# Patient Record
Sex: Male | Born: 1935
Health system: Southern US, Community
[De-identification: ages and names within clinical notes are randomized; demographics above are authoritative.]

## PROBLEM LIST (undated history)

## (undated) DIAGNOSIS — J449 Chronic obstructive pulmonary disease, unspecified: Secondary | ICD-10-CM

## (undated) DIAGNOSIS — H409 Unspecified glaucoma: Secondary | ICD-10-CM

## (undated) DIAGNOSIS — J42 Unspecified chronic bronchitis: Secondary | ICD-10-CM

## (undated) DIAGNOSIS — R1310 Dysphagia, unspecified: Secondary | ICD-10-CM

## (undated) DIAGNOSIS — I639 Cerebral infarction, unspecified: Secondary | ICD-10-CM

## (undated) DIAGNOSIS — R0989 Other specified symptoms and signs involving the circulatory and respiratory systems: Secondary | ICD-10-CM

## (undated) DIAGNOSIS — I2510 Atherosclerotic heart disease of native coronary artery without angina pectoris: Secondary | ICD-10-CM

## (undated) DIAGNOSIS — I10 Essential (primary) hypertension: Secondary | ICD-10-CM

## (undated) DIAGNOSIS — E785 Hyperlipidemia, unspecified: Secondary | ICD-10-CM

## (undated) DIAGNOSIS — E119 Type 2 diabetes mellitus without complications: Secondary | ICD-10-CM

## (undated) DIAGNOSIS — K5900 Constipation, unspecified: Secondary | ICD-10-CM

## (undated) DIAGNOSIS — D500 Iron deficiency anemia secondary to blood loss (chronic): Secondary | ICD-10-CM

## (undated) HISTORY — DX: Other specified symptoms and signs involving the circulatory and respiratory systems: R09.89

## (undated) HISTORY — DX: Essential (primary) hypertension: I10

## (undated) HISTORY — DX: Atherosclerotic heart disease of native coronary artery without angina pectoris: I25.10

## (undated) HISTORY — DX: Unspecified chronic bronchitis: J42

## (undated) HISTORY — DX: Chronic obstructive pulmonary disease, unspecified: J44.9

## (undated) HISTORY — PX: CARDIAC CATHETERIZATION: SHX172

## (undated) HISTORY — DX: Hyperlipidemia, unspecified: E78.5

## (undated) HISTORY — DX: Iron deficiency anemia secondary to blood loss (chronic): D50.0

## (undated) HISTORY — DX: Type 2 diabetes mellitus without complications: E11.9

---

## 1999-02-17 ENCOUNTER — Inpatient Hospital Stay (HOSPITAL_COMMUNITY): Admission: EM | Admit: 1999-02-17 | Discharge: 1999-02-24 | Payer: Self-pay | Admitting: Cardiology

## 1999-02-21 ENCOUNTER — Encounter: Payer: Self-pay | Admitting: Cardiothoracic Surgery

## 1999-02-22 ENCOUNTER — Encounter: Payer: Self-pay | Admitting: Cardiothoracic Surgery

## 1999-02-23 ENCOUNTER — Encounter: Payer: Self-pay | Admitting: Cardiothoracic Surgery

## 2001-08-01 ENCOUNTER — Inpatient Hospital Stay (HOSPITAL_COMMUNITY): Admission: EM | Admit: 2001-08-01 | Discharge: 2001-08-05 | Payer: Self-pay | Admitting: Cardiology

## 2002-10-24 ENCOUNTER — Ambulatory Visit (HOSPITAL_COMMUNITY): Admission: RE | Admit: 2002-10-24 | Discharge: 2002-10-25 | Payer: Self-pay | Admitting: Internal Medicine

## 2002-11-09 ENCOUNTER — Ambulatory Visit (HOSPITAL_COMMUNITY): Admission: RE | Admit: 2002-11-09 | Discharge: 2002-11-10 | Payer: Self-pay | Admitting: Cardiology

## 2007-07-05 ENCOUNTER — Ambulatory Visit: Payer: Self-pay | Admitting: Cardiology

## 2007-07-06 ENCOUNTER — Encounter: Payer: Self-pay | Admitting: Cardiology

## 2007-07-07 ENCOUNTER — Inpatient Hospital Stay (HOSPITAL_COMMUNITY): Admission: AD | Admit: 2007-07-07 | Discharge: 2007-07-08 | Payer: Self-pay | Admitting: Cardiology

## 2007-07-07 ENCOUNTER — Ambulatory Visit: Payer: Self-pay | Admitting: Cardiovascular Disease

## 2007-07-16 ENCOUNTER — Encounter: Payer: Self-pay | Admitting: Cardiology

## 2007-07-16 ENCOUNTER — Ambulatory Visit: Payer: Self-pay | Admitting: Cardiology

## 2007-07-17 ENCOUNTER — Ambulatory Visit: Payer: Self-pay | Admitting: Vascular Surgery

## 2007-07-17 ENCOUNTER — Inpatient Hospital Stay (HOSPITAL_COMMUNITY): Admission: EM | Admit: 2007-07-17 | Discharge: 2007-07-18 | Payer: Self-pay | Admitting: Emergency Medicine

## 2007-07-17 ENCOUNTER — Encounter: Payer: Self-pay | Admitting: Cardiology

## 2007-08-03 ENCOUNTER — Ambulatory Visit: Payer: Self-pay | Admitting: Cardiology

## 2007-11-22 ENCOUNTER — Inpatient Hospital Stay (HOSPITAL_COMMUNITY): Admission: AD | Admit: 2007-11-22 | Discharge: 2007-11-25 | Payer: Self-pay | Admitting: Cardiovascular Disease

## 2007-11-22 ENCOUNTER — Encounter: Payer: Self-pay | Admitting: Cardiology

## 2007-11-22 ENCOUNTER — Ambulatory Visit: Payer: Self-pay | Admitting: Cardiology

## 2007-11-22 ENCOUNTER — Encounter: Payer: Self-pay | Admitting: Physician Assistant

## 2007-11-25 ENCOUNTER — Encounter: Payer: Self-pay | Admitting: Cardiology

## 2007-12-18 ENCOUNTER — Encounter: Payer: Self-pay | Admitting: Physician Assistant

## 2007-12-18 ENCOUNTER — Ambulatory Visit: Payer: Self-pay | Admitting: Cardiology

## 2007-12-22 ENCOUNTER — Ambulatory Visit: Payer: Self-pay | Admitting: Cardiology

## 2008-03-29 ENCOUNTER — Ambulatory Visit: Payer: Self-pay | Admitting: Cardiology

## 2009-04-12 ENCOUNTER — Ambulatory Visit: Payer: Self-pay | Admitting: Cardiology

## 2009-04-16 ENCOUNTER — Encounter: Payer: Self-pay | Admitting: Cardiology

## 2009-08-14 ENCOUNTER — Encounter: Payer: Self-pay | Admitting: Cardiology

## 2009-08-26 ENCOUNTER — Encounter: Payer: Self-pay | Admitting: Cardiology

## 2009-09-08 ENCOUNTER — Encounter: Payer: Self-pay | Admitting: Cardiology

## 2009-11-18 ENCOUNTER — Encounter: Payer: Self-pay | Admitting: Cardiology

## 2009-11-18 DIAGNOSIS — I10 Essential (primary) hypertension: Secondary | ICD-10-CM

## 2009-11-18 DIAGNOSIS — Z951 Presence of aortocoronary bypass graft: Secondary | ICD-10-CM

## 2009-11-18 DIAGNOSIS — I2510 Atherosclerotic heart disease of native coronary artery without angina pectoris: Secondary | ICD-10-CM

## 2009-11-18 DIAGNOSIS — J449 Chronic obstructive pulmonary disease, unspecified: Secondary | ICD-10-CM

## 2009-11-18 DIAGNOSIS — E785 Hyperlipidemia, unspecified: Secondary | ICD-10-CM

## 2009-11-18 DIAGNOSIS — F17200 Nicotine dependence, unspecified, uncomplicated: Secondary | ICD-10-CM

## 2009-11-18 DIAGNOSIS — Z8673 Personal history of transient ischemic attack (TIA), and cerebral infarction without residual deficits: Secondary | ICD-10-CM

## 2009-11-18 DIAGNOSIS — Z9861 Coronary angioplasty status: Secondary | ICD-10-CM | POA: Insufficient documentation

## 2009-11-18 DIAGNOSIS — I200 Unstable angina: Secondary | ICD-10-CM

## 2009-11-18 DIAGNOSIS — I739 Peripheral vascular disease, unspecified: Secondary | ICD-10-CM | POA: Insufficient documentation

## 2009-11-18 DIAGNOSIS — J4489 Other specified chronic obstructive pulmonary disease: Secondary | ICD-10-CM | POA: Insufficient documentation

## 2010-06-02 NOTE — Letter (Signed)
Summary: Appointment -missed  [facility] [address]   [place], [place] [address]   Phone: [phone]  Fax: [phone]     November 18, 2009 MRN: [id number]     [facility] [address] [place], [place]  [address]     Dear Mr. [facility],  Our records indicate you missed your appointment on November 18, 2009                        with Dr.  Andee Lineman.   It is very important that we reach you to reschedule this appointment. We look forward to participating in your health care needs.   Please contact us at the number listed above at your earliest convenience to reschedule this appointment.   Sincerely,    Glass blower/designer

## 2010-06-02 NOTE — Consult Note (Signed)
Summary: CARDIOLOGY CONSULT/ [facility]  CARDIOLOGY CONSULT/ [facility]   Imported By: Zachary George 11/18/2009 11:03:55  _____________________________________________________________________  External Attachment:    Type:   Image     Comment:   External Document

## 2010-06-02 NOTE — Letter (Signed)
Summary: [facility] D/C DR. MOHAMMAD ANWAR  [facility] D/C DR. MOHAMMAD ANWAR   Imported By: Zachary George 11/18/2009 11:03:10  _____________________________________________________________________  External Attachment:    Type:   Image     Comment:   External Document

## 2010-06-02 NOTE — Letter (Signed)
Summary: Appointment -missed  [facility]  [address]   [place], [place] [address]   Phone: [phone]  Fax: [phone]     November 18, 2009 MRN: [id number]     [facility] Devine[facility] [address] [place], [place]  [address]     Dear Mr. Mizuno,  Our records indicate you missed your appointment on November 18, 2009                        with Dr.  Haelie Clapp.   It is very important that we reach you to reschedule this appointment. We look forward to participating in your health care needs.   Please contact us at the number listed above at your earliest convenience to reschedule this appointment.   Sincerely,     HeartCare Scheduling Team 

## 2010-06-02 NOTE — Consult Note (Signed)
Summary: CARDIOLOGY CONSULT/ [facility]  CARDIOLOGY CONSULT/ [facility]   Imported By: Zachary George 11/18/2009 11:01:25  _____________________________________________________________________  External Attachment:    Type:   Image     Comment:   External Document

## 2010-09-15 NOTE — Assessment & Plan Note (Signed)
[facility]                          [place] [facility] OFFICE NOTE   [name]                       MRN:          [id number]  DATE:12/18/2007                            DOB:          07-26-1934    CARDIOLOGIST:  Learta Codding, MD,FACC   PRIMARY CARE PHYSICIAN:  Erasmo Downer, MD   REASON FOR VISIT:  Posthospitalization followup.   HISTORY OF PRESENT ILLNESS:  [name] is a 76 year old male patient  with a history of CAD status post CABG in 2000, who has had multiple  percutaneous coronary interventions in the past.  Most notably, he had  stenting of the left main in 2003 and then cutting balloon angioplasty  secondary to in-stent restenosis in July 2004.  His most recent  intervention was done in March 2009, where he had bare-metal stent  placement to the proximal, native RCA.  The patient was recently seen at  [facility] with complaints of chest pain that were concerning for  unstable angina.  Of note, he did have elevated troponins ruling him in  for a non-ST-elevation myocardial infarction.  He was transferred to  [facility] where he underwent cardiac catheterization and  subsequent percutaneous coronary intervention of the native RCA  secondary to in-stent restenosis.  Specifically, his internal mammary to  the second diagonal, saphenous vein graft to the distal LAD, saphenous  vein graft to obtuse marginal were all patent.  His vein graft to the  first diagonal has been known to be occluded in the past.  His left main  stent continued to be patent with 70% narrowing.  His EF was noted to be  55%.  The patient was not felt to be a candidate for drug-eluting stent  therapy secondary to a history of microcytic anemia and probable history  of GI bleeding in the past.  The patient was maintained on Plavix for 5  days post intervention.  He continues on aspirin therapy.  During his  hospitalization, he was noted to  have microcytic anemia and lab values  returned revealing an iron level of 27 and a ferritin level of 13.  He  was placed on iron replacement therapy.   In the office today, the patient notes he is overall doing well.  He  denies any chest pain similar to what he came into the hospital with  several weeks ago.  He does have some chest soreness.  This is mainly in  the afternoons.  He denies any significant shortness of breath.  He  denies orthopnea or PND.  He does have occasional pedal edema without  significant change.  He denies syncope, but has had some near-syncopal  episodes per his report.  This usually occurs from sitting to standing.  He has not fallen.  He denies any history of balance problems in the  past.  He does note dark stools.  He denies any tarry stools.  This has  been present since he was started on iron therapy.   CURRENT MEDICATIONS:  Metoprolol 25 mg b.i.d. (we are not sure if he has  taken this medication or not), Lisinopril 20 mg daily,  Metformin 500 mg b.i.d., Glipizide ER 5 mg daily, Aspirin 325 mg daily,  Ferrex 150 b.i.d., Meclizine 25 mg q.i.d. p.r.n., Isosorbide 20 mg  b.i.d., Simvastatin 20 mg nightly,Nitroglycerin p.r.n. chest pain.   PHYSICAL EXAMINATION:  GENERAL:  He is a well-nourished, well-developed  male, in no acute distress.  VITAL SIGNS:  Blood pressure 108/58, pulse 86, and weight 188.8 pounds.  HEENT:  Normal.  NECK:  Without JVD or lymphadenopathy.  CARDIAC:  Normal S1 and S2, regular rate and rhythm with a 1-2/6  systolic ejection murmur best heard along the left sternal border.  LUNGS:  Clear to auscultation bilaterally.  ABDOMEN:  Soft and nontender.  EXTREMITIES:  Trace edema.  NEUROLOGIC:  He is alert and oriented x3.  Cranial nerves II-XII grossly  intact.  VASCULAR:  Bilateral femoral arteriotomy site without hematoma.  He does  have positive bilateral femoral artery bruits.  These have been present  in the past.  ORTHOSTATIC  VITAL SIGNS:  Blood pressure 115/62 with pulse of 72 lying,  sitting 124/65 with pulse of 73, standing 114/60 with a pulse of 83,  after 2 minutes 111/65 with a pulse of 73.  The patient was considerably  dizzy throughout his orthostatic vital signs.   ASSESSMENT/PLAN:  1. Coronary artery disease status post coronary artery bypass graft in      2000 with subsequent percutaneous coronary intervention of the left      main as well as the native right coronary artery.  He was recently      admitted with evidence of non-ST-elevation myocardial infarction      and significant in-stent restenosis in the RCA treated with PTCA.      He is overall stable.  He has had some chest soreness, but this is      atypical for his typical angina.  Electrocardiogram in the office      today revealed normal sinus rhythm, heart rate of 75, normal axis,      LVH with repolarization abnormality, and no significant change from      the previous tracing.  The patient will review his medicines when      he gets home and let us know if he is taking metoprolol.  If not,      he should start on 25 mg half tablet b.i.d.  He will continue on      aspirin for now as well as isosorbide.  2. Iron deficiency anemia with a history of possible gastrointestinal      bleeding.  The patient tells me he apparently had significant      workup with Dr. Linna Darner in the past.  He is due to see Dr. Linna Darner next      week.  We will check CBC and stool cards to follow up.  These      results will be forwarded on to Dr. Linna Darner.  If his hemoglobin has      significantly dropped since he was in the hospital, he will need      earlier followup with Dr. Linna Darner.  He will continue on iron for now.      He does complain of some soreness from time to time.  Question      whether or not he may have some problems with acid reflux.  At this      point in time, I have recommended ranitidine 150 mg b.i.d.  3. Dyslipidemia.  The patient's recent lipid panel  returned with a      total cholesterol of 113, HDL 17, and LDL 46.  He continues on      simvastatin.  It has been quite some time since his LFTs were      checked.  We will check a CMET with his blood work today to follow      up on that.  4. Cerebrovascular disease.  His last carotid Dopplers were done in      April 2009.  At that time, he had 70-79% right ICA stenosis and 50-      69% left ICA stenosis.  We will make sure he has followup carotid      Dopplers done in October of this year.  5. Chronic obstructive pulmonary disease with ongoing tobacco abuse.      The patient knows he needs to discontinue his cigarettes.  6. Diabetes mellitus.  This is followed by his primary care physician.      He should continue followup for adequate control.  7. Hypertension.  This is adequately controlled.  He will continue on      his current medications.  As noted, he will contact us about his      metoprolol.  8. Systolic murmur.  The patient has had preserved LV function      documented in the past by cardiac catheterization.  I cannot find      an echocardiogram documented in the past and he does have systolic      murmur on exam.  An echocardiogram will be obtained.  9. Dizziness.  The patient has meclizine listed on his medications.      He may have problems with vertigo.  He also may have other balance      issues.  He is not orthostatic on exam today.  He should follow up      with his primary care physician.   DISPOSITION:  The patient will be brought back in followup early in the  next 2-3 weeks with Dr. Andee Lineman.  We will review the above testing and  make further recommendations if needed.  The patient is to return sooner  as necessary.  As noted above, he will be set up with his primary care  physician in the next 1-2 weeks as well.      Tereso Newcomer, PA-C  Electronically Signed      Madolyn Frieze. Jens Som, MD, [facility]  Electronically Signed   SW/MedQ  DD: 12/18/2007  DT:  12/19/2007  Job #: [id number]   cc:   Erasmo Downer, MD

## 2010-09-15 NOTE — Cardiovascular Report (Signed)
Miller, [name]                ACCOUNT NO.:  [id number]   MEDICAL RECORD NO.:  [id number]          PATIENT TYPE:  INP   LOCATION:  6529                         FACILITY:  [facility]   PHYSICIAN:  Veverly Fells. Excell Seltzer, MD  DATE OF BIRTH:  [birth year]   DATE OF PROCEDURE:  07/07/2007  DATE OF DISCHARGE:  07/08/2007                            CARDIAC CATHETERIZATION   PROCEDURE:  1. Left heart catheterization.  2. Selective coronary angiography.  3. Left ventricular angiography.  4. Saphenous vein graft angiography.  5. Left internal mammary artery angiography.  6. Percutaneous transluminal cardiac angioplasty and stenting of the      right coronary artery.  7. Abdominal aortogram.   INDICATIONS:  Mr. Schnitker is a 76 year old gentleman who is post CABG.  He has undergone PCI procedures as well.  He underwent stenting of a  protected left main several years back as well as treatment of in-stent  restenosis with cutting balloon angioplasty.  He is not followed  regularly with any physicians, and he presented after several years with  symptoms consistent with unstable angina.  He was referred for cardiac  catheterization.  He initially has ruled out for myocardial infarction.   Risks and indications of the procedure were reviewed with the patient.  Informed consent was obtained.  The right groin was prepped, draped,  anesthetized with 1% lidocaine.  Using modified Seldinger technique, a 6-  French sheath was placed in the right femoral artery.  Standard 6-French  Judkins catheters were used for native coronary angiography. A JR-4  catheter was then used for the saphenous vein graft angiography. The  same JR-4 catheter was used for LIMA angiography, and I was able to  enter the subclavian artery with that catheter.  However, I was unable  to selectively engage the LIMA.  Using an exchange length guidewire, I  changed out for a LIMA catheter, but after multiple attempts was still  unable to  selectively engage the vessel.  Therefore, a subclavian  angiogram was performed, and the LIMA was well seen.   Following the diagnostic procedure, I elected to intervene on the right  coronary artery.  There was a moderately severe stenosis in the proximal  vessel of 80%.  Angiomax was used for anticoagulation.  The patient was  loaded with Plavix.  The lesion was predilated with a 2.5 of 15 mm Fire  Star balloon.  Following predilatation, I elected to stent the vessel  with 3.0 x 18 mm Vision stent which was deployed at 14 atmospheres.  Following stenting, there was marked improvement in the appearance of  the proximal vessel.  The stent appeared widely expanded, and there was  TIMI III flow throughout.  I elected to post dilate the stent with a  3.25 x 10 mm DuraStar balloon on two separate inflations up to 16  atmospheres.  Following post dilatation, there was an excellent  angiographic result.  The stent appeared widely expanded.  There was  TIMI III flow throughout.  The patient tolerated the procedure well and  had no immediate complications.  The guide catheter and wire  were  removed.  An angled pigtail catheter was passed into the left ventricle  where ventriculography was performed, and pullback across the aortic  valve was done.  The catheter was then brought back to the suprarenal  abdominal aorta where abdominal aortography was performed to visualize  the aorta and proximal iliac arteries.   FINDINGS:  Aortic pressure 168/74 with a mean of 107, left ventricular  pressure 179/19.   CORONARY ANGIOGRAPHY:  Left mainstem:  The left mainstem is stented.  There is diffuse in-stent restenosis of 70% throughout the stent.  The  left mainstem bifurcates into the LAD and left circumflex. The LAD is  patent in its proximal portion.  It supplies a diagonal branch.  The  proximal LAD appears to have 50% stenosis.  The first diagonal branches  of moderate size and has no significant  stenosis.  There is a large  first septal perforator. The mid and distal LAD fill predominantly from  graft flow.   The left circumflex system is very small.  The AV groove circumflex  supplies a distal OM branch.  The proximal OM branches also appear very  small   The right coronary artery has severe stenosis in the proximal vessel of  75-80%.  The vessel was diffusely diseased, but there are no other high-  grade stenoses present.  The distal vessel has diffuse plaque with no  significant stenosis.  It supplies a PDA branch as well as  posterolateral branch.  The PDA is relatively large. There is moderate  calcification throughout the proximal vessel.   SAPHENOUS VEIN GRAFT ANGIOGRAPHY:  Saphenous vein graft to the second OM  branch is widely patent.  The circumflex system fills predominantly via  graft flow.  There is no significant stenosis in the bypass graft.  The  first OM is very small.  The AV groove circumflex is small and diffusely  diseased   Saphenous vein graft to the first diagonal branch is occluded.   Saphenous vein graft to the LAD is anastomosed to the distal vessel.  It  fills the midportion of the LAD via retrograde flow, supplying multiple  septal perforators as well as a second diagonal branch.  The proximal  LAD is occluded   LIMA to second diagonal branch is patent.  There are no significant  stenoses.   Left ventriculography demonstrates normal LV function with an LVEF  estimated at 65%.   ABDOMINAL AORTA:  The renal arteries are widely patent.  The aorta has  no focal aneurysm.  The iliac vessels are patent.  Bilaterally, this  includes the common internal and external iliacs, all of which are  widely patent.   ASSESSMENT:  1. Three-vessel coronary artery disease.  2. Status post coronary artery bypass grafting with three of four      patent grafts.  3. Successful percutaneous intervention of the right coronary artery      with a Vision bare  metal stent.  4. Normal left ventricular function.  5. Patent abdominal aorta, renal arteries, and bilateral iliac      arteries.   PLAN:  Mr. Mandley should continue on indefinite aspirin.  He should  remain on Plavix for 30 days.  With patent grafts to the LAD and left  circumflex, I did not see a reason to intervene on his left main stent  at this point.  His right coronary artery was treated as detailed above,  and hopefully this will provide symptomatic improvement.  Veverly Fells. Excell Seltzer, MD  Electronically Signed     MDC/MEDQ  D:  07/11/2007  T:  07/11/2007  Job:  [id number]

## 2010-09-15 NOTE — Discharge Summary (Signed)
[name], [name]                ACCOUNT NO.:  [id number]   MEDICAL RECORD NO.:  [id number]          PATIENT TYPE:  INP   LOCATION:  2035                         FACILITY:  [facility]   PHYSICIAN:  Darin Morton. Riley Kill, MD, FACCDATE OF BIRTH:  1935-02-13   DATE OF ADMISSION:  07/16/2007  DATE OF DISCHARGE:                               DISCHARGE SUMMARY   DATE OF ADMISSION:  July 17, 2007.   DATE OF DISCHARGE:  July 18, 2007.   PRIMARY CARDIOLOGIST:  Dr. Lewayne Miller.   PRIMARY CARE PHYSICIAN:  Dr. Erasmo Miller.   PROCEDURES PERFORMED DURING HOSPITALIZATION:  None.   FINAL DISCHARGE DIAGNOSES:  1. Non-cardiac chest pain.  2. Coronary artery disease.      a.     Status post stenting of an 80% proximal right coronary       artery using a vision 3x18 mm stent per Dr. Excell Seltzer on July 07, 2007.      b.     Status post cutting balloon percutaneous transluminal       coronary angioplasty of an 80% end-stent left main restenosis in       July 2004.      c.     Status post stenting of a 95% left main coronary artery       stenosis by Dr. Riley Miller in April 2003.      d.     Residual 50% stenosis of the saphenous vein graft to left       anterior descending artery graft at the insertion site.  Atretic       saphenous vein graft to diagonal graft.  Otherwise, patent grafts.      e.     Preserved left ventricular ejection fraction.  3. Long-standing tobacco abuse.  4. Peripheral vascular disease.      a.     Ankle brachial index 0.77 on the right and 1.0 on the left       in 2004.  5. Type 2 diabetes.  6. Dyslipidemia.  7. Hypertension.  8. Noncompliance with medical regimen.   HOSPITAL COURSE:  This is a 76 year old African-American male well-known  to our practice, who was admitted secondary to recurrent chest  discomfort.  He was also found to be hypertensive at home and  hyperglycemic on arrival.  The patient states that the pain was  different from the pain that he experienced  prior to his stent  placement, describing it as throbbing pain on the right side of the  chest radiating to the left chest with prior admission, prior to stent  placement, described as something pressing on my chest.  The patient  was found to be hypertensive at home per EMS with blood pressure greater  than 200 systolic.  The patient was brought to the [facility] emergency  room.  He had been given nitroglycerin en route.  The patient's blood  pressure on arrival was 154/67.  The patient was brought to ICU stepdown  for further evaluation and management with a history of known coronary  artery  disease.   Cardiac enzymes were cycled and found to be negative x3.  His hemoglobin  A1c was found to be elevated at 8.6.  The patient was subsequently moved  to telemetry floor after cardiac enzymes were found to be negative.  The  patient had no further complaints of chest discomfort.  He was examined  by Dr. Willa Miller and found that it was not necessary to proceed with  cardiac catheterization, as the pain was not similar to the pain of  angina that brought him in originally less than 2 weeks prior.  The  patient did have some mild bruit on the right but no pseudo-aneurysm was  found. The patient was up and walking around the day of discharge  without any recurrence of symptoms.  The patient was also seen by  nutritional services secondary to diabetic diet noncompliance. He was  provided with more information, but they doubted that he would continue  with compliance at home.   On the day of discharge, the patient was seen by Dr. Bonnee Miller, and  found to be stable.  Blood pressure 154/72, pulse 61, respirations 20,  with a temperature of 97.3.  His blood glucose was 167, BUN 6,  creatinine 0.34, hemoglobin 11.4, and platelets 344,000.  The patient  was found to be stable for discharge.   DISCHARGE MEDICINES:  1. Aspirin 325 mg once a day.  2. Metoprolol 25 mg twice a day.  3.  Hydrochlorothiazide 12.5 mg once a day.  4. Lipitor 80 mg daily.  5. Plavix 75 mg daily.  6. Glucophage 500 mg twice a day.  7. Lisinopril 20 mg once a day (or 4 Zestril).  8. Nitroglycerin 0.4 mg as needed under tongue for chest discomfort.   ALLERGIES:  No known drug allergies.   FOLLOWUP PLAN AND APPOINTMENT:  1. The patient is to follow up with Dr. Lewayne Miller on April 2 at 1:30      p.m.  2. The patient is to follow up with his primary care physician, Dr.      Linna Miller for continued medical management of diabetes.  3. The patient has been encouraged to be medically compliant to avoid      recurrent hospitalization.  4. The patient has been encouraged to follow diabetic diet.   Time spent with the patient, to include physician time, is 30 minutes.      Darin Mare. Lyman Bishop, NP      Darin Morton. Riley Kill, MD, [facility]  Electronically Signed    KML/MEDQ  D:  07/18/2007  T:  07/18/2007  Job:  [id number]   cc:   Darin Downer, MD

## 2010-09-15 NOTE — Assessment & Plan Note (Signed)
[facility]                                 ON-CALL NOTE   MANDELA, BELLO                      MRN:          [id number]  DATE:07/16/2007                            DOB:          1934/08/08    PRIMARY CARDIOLOGIST:  Dr. Myrtis Ser.   TIME OF CALL:  Approximately 6:30.   BRIEF HISTORY:  Mr. Haff is a 76 year old male who called this  evening stating that he has been having chest discomfort for the  preceding 3 hours.  He describes it as an anterior chest pressure  radiating to his left arm unassociated with shortness of breath, nausea,  vomiting, or diaphoresis.  He states that it is similar to the  discomfort that he had the preceding week where multiple stents were  placed.  Over the preceding 3 hours, he has taken 2 sublingual  nitroglycerin with improvement of his discomfort from a 7/10 to 4/10.  However, he continued to have discomfort.   I suggested to him that he contact 9-1-1 for transport to [facility] for further evaluation.  He stated that his daughter would  drive him.  I again informed him, it would be safer form a medical  standpoint if he called 9-1-1, particularly if his discomfort is a  cardiac issue that he needed to be evaluated.  He stated that he would  do so.  After I hung up with Mr. Fatzinger, I contacted the [facility]  emergency room to inform them of his pending arrival.      Joellyn Rued, PA-C  Electronically Signed      Learta Codding, MD,FACC  Electronically Signed   EW/MedQ  DD: 07/17/2007  DT: 07/17/2007  Job #: [id number]

## 2010-09-15 NOTE — Assessment & Plan Note (Signed)
[facility]                          [facility] NOTE   SAMAN, UMSTEAD                       MRN:          [id number]  DATE:03/29/2008                            DOB:          Apr 30, 1935    PRIMARY CARE PHYSICIAN:  Erasmo Downer, MD   PRIMARY CARDIOLOGIST:  Learta Codding, MD, [facility]   Mr. Fouche is seen today in followup.  He was last seen in the office  on December 18, 2007, and at that time had a 2-D echocardiogram and  carotid Doppler scheduled.  He is here today to follow up on these.   Mr. Spruce has not had any chest pain.  He denies shortness of breath.  He rarely has lower extremity edema, but this is generally daytime and  he does not wake up with it in the morning.  He has an occasional  headache.  He does have some issues with fatigue, but he states that his  activities are not limited.  He has some problems with constipation and  states that he takes milk of magnesia and Feen-a-mint at least once a  week.  He feels that his p.o. intake is poor and he has a poor appetite,  but he denies depression, nausea, vomiting, or other GI symptoms.   CURRENT MEDICATIONS:  Aspirin 325 mg daily.   PHYSICAL EXAMINATION:  VITAL SIGNS:  Weight is 193.2 pounds, blood  pressure 156/91, heart rate 80, and respiratory rate 20.  GENERAL:  He is a well-developed elderly African American male in no  acute distress.  NECK:  There is no JVD or thyromegaly noted.  He has bilateral carotid  bruits.  CHEST:  Clear to auscultation bilaterally.  CV:  His heart is regular in rate and rhythm with S1 and S2 and a short  systolic ejection murmur is noted at the lower left sternal border.  ABDOMEN:  Soft and nontender with slightly decreased, but present bowel  sounds.  EXTREMITIES:  There is no edema noted.  Distal pulses are intact.   Carotid Dopplers show on the right a large amount of plaque with intimal  thickening and a velocity between 50%  and 69%, but suspect the degree of  stenosis closer to 69%.  Increased plaque and narrowing involving the  left RCA with stenosis between 50% and 69%.  A small amount of flow in  the right vertebral artery not identified on the previous exam, but the  way of flow is abnormal.   A 2-D echocardiogram showing an EF of 70% with mild tricuspid  regurgitation and moderate concentric LVH.   IMPRESSION:  1. Coronary artery disease:  The patient is encouraged to continue      aspirin daily.  We will add a beta-blocker to his medication      regimen with an eye towards cost savings.  He is encouraged to      contact us if he gets any chest pain or other symptoms.  2. History of hyperlipidemia:  The patient is agreeable to taking 4-      dollar medication from [facility]  and will therefore be started on      Pravachol 40 mg daily p.m.  He is to follow up with Dr. Andee Lineman in 3      months and will need lipid and liver profiles prior to this office      visit.  3. Constipation:  A prescription was written for Raisin Bran 1 serving      daily.  He is encouraged to eat small, but regular meals and      contact Dr. Linna Darner if his symptoms do not improve.  4. Hypertension:  On the beta-blocker, he can be followed as an      outpatient and if he is agreeable an angiotensin-converting enzyme      inhibitor can be restarted.  5. Ongoing tobacco use:  Mr. Youngblood states that he is down to a half      pack daily.  He is encouraged to continue to decrease and hopefully      cease tobacco use.      Theodore Demark, PA-C  Electronically Signed      Learta Codding, MD,FACC  Electronically Signed   RB/MedQ  DD: 03/29/2008  DT: 03/30/2008  Job #: [id number]   cc:   Erasmo Downer, MD

## 2010-09-15 NOTE — Cardiovascular Report (Signed)
[name], [name]                ACCOUNT NO.:  [id number]   MEDICAL RECORD NO.:  [id number]          PATIENT TYPE:  INP   LOCATION:  6533                         FACILITY:  [facility]   PHYSICIAN:  Arturo Morton. Riley Kill, MD, FACCDATE OF BIRTH:  [birth year]   DATE OF PROCEDURE:  DATE OF DISCHARGE:                            CARDIAC CATHETERIZATION   INDICATIONS:  [name] is a 76 year old well known to [place].  He has  had prior bypass surgery.  He has had stenting of a protected left main.  He has also recently in March of this year had stenting with a non-drug-  eluting stent because of concerns over Plavix durability.  He has  developed in-stent restenosis.  Importantly, the patient has had a GI  bleed within the last several months.  The source is not clear.  However, the patient did require transfusion.  As a result, he was felt  not to be a candidate for drug-eluting stent in the in-stent restenosis.  The plan on the front end was to recommend a balloon approach only, and  this was explained in great detail to the patient and his family.   PROCEDURE:  Percutaneous stenting of in-stent restenosis.   DESCRIPTION OF THE PROCEDURE:  The patient was brought to the Cath Lab  and prepped and draped in the usual fashion.  Using a SmartNeedle, we  entered the left femoral artery.  A 6-French sheath was placed.  Bivalirudin was given according to protocol.  The patient had received  preprocedural chewable aspirin as well as clopidogrel.  An ACT was  checked and found to be appropriate for percutaneous intervention.  A  Prowater wire was placed down the vessel.  Following this, we used a 2.5  x 15 cutting balloon, which was taken to 7 atmospheres pressure.  The  entire stented area was cut.  The cutting balloon was then pulled back.  We then went up with a 3-0 DuraStar balloon, and it required balloon  angioplasty just beyond the stented areas on both ends due to edge  restenosis as well.   Following this, we ended up using a 3 x 5 Apex  balloon within the stent, so that we could use low pressures and get  nominal size.  Dilatations were performed at the distal edge of the  stent, in the stent, and the proximal edge of the stent with marked  improvement in appearance of the artery.  The procedure was completed  without complication.   The femoral sheath was sewn into place.  He was taken to the holding  area in satisfactory clinical condition.   ANGIOGRAPHIC DATA:  The right coronary artery is a very large-caliber  vessel.  Of note, when the vessel was dilated, the patient did develop  ST elevation.  The patient had 90% in-stent restenosis, which was  diffuse and segmental, was successfully dilated to about 30% residual  luminal narrowing with a good final result.  There were no major  complications.   CONCLUSION:  Successful repeat percutaneous angioplasty of in-stent  restenosis.   DISCUSSION:  The patient is not  a candidate for drug-eluting stent for  treatment of this.  We will continue to treat him at the present time.  He would not be a good candidate for brachytherapy either, specifically  because of the need for prolonged anticoagulation.  Therefore, we will  recommend close followup.  The patient may again require a repeat  interventions.  He will just need to be followed closely.      Arturo Morton. Riley Kill, MD, [facility]  Electronically Signed     TDS/MEDQ  D:  11/24/2007  T:  11/24/2007  Job:  [id number]   cc:   Erasmo Downer, MD  Learta Codding, MD,FACC

## 2010-09-15 NOTE — Discharge Summary (Signed)
NAMESHAMEEK, NYQUIST                ACCOUNT NO.:  [id number]   MEDICAL RECORD NO.:  [id number]          PATIENT TYPE:  INP   LOCATION:  6533                         FACILITY:  [facility]   PHYSICIAN:  Thomas C. Wall, MD, FACCDATE OF BIRTH:  17-Jan-1935   DATE OF ADMISSION:  11/22/2007  DATE OF DISCHARGE:  11/25/2007                               DISCHARGE SUMMARY   PROCEDURES:  1. Left heart cardiac catheterization.  2. Coronary arteriogram and left ventriculogram.  3. Saphenous vein angiogram.  4. Left internal mammary artery arteriogram.  5. Percutaneous transluminal coronary angioplasty of one vessel.   PRIMARY/FINAL DISCHARGE DIAGNOSIS:  Unstable anginal pain.   SECONDARY DIAGNOSES:  1. Status post aortocoronary bypass surgery in 2000, saphenous vein      graft to left anterior descending artery, internal mammary artery  to D2, saphenous vein graft to DI (totaled), and saphenous vein graft to  obtuse marginal.  1. Status post bare-metal stent to the right coronary artery in March      2009.  2. Status post stenting to the left main in April 2003.  3. Percutaneous transluminal coronary angioplasty for in-stent      restenosis in 2004 to that vessels.  4. Preserved left ventricular function with an ejection fraction of      55% on cath at this admission.  5. Cerebrovascular disease with right vertebral totaled, greater than      70% right internal carotid artery and greater than 50% left      internal carotid artery.  6. Chronic obstructive pulmonary disease  7. Ongoing tobacco use.  8. Diabetes.  9. Dyslipidemia, total cholesterol of 133, triglycerides 104, HDL 18,      and LDL 94 since March 2009.  16.XWRUEAV of bilateral femoral bruits with patent vasculature.  11.History of transient ischemic attack in April 2009.  12.Anemia with a hemoglobin of 10.3 and hematocrit 31.4, iron      deficiency with an iron level of 27 and a ferritin level of 13 on      this admission.   TIME  OF DISCHARGE:  45 minutes.   HOSPITAL COURSE:  Mr. Loughmiller is a 76 year old male with a history of  coronary artery disease.  He went to [facility] where he was seen  by the [facility] Team there.  There was concern for unstable anginal pain,  and he was transferred to [facility].   He had a heart cath on November 22, 2007, which showed the SVG to D1 being  occluded, but that was old.  Left main with 70% stenosis, OM 50%  stenosis, but he had a RCA stenosis of 90% in the previously placed  stent.  There was also 50% distally.  His EF was normal.  He was held  over and hydrated and on November 24, 2007, Dr. Riley Kill performed PTCA only  reducing the stenosis from 95% to 30%.  He was not considered a drug-  eluting stent candidate secondary to a history of GI bleed.   Because of his anemia and iron deficiency, he was started on iron  supplementation.  Tobacco cessation was encouraged.  Today, we started  on nicotine patch and strongly advised that he must not smoke while  wearing it.  He had some bradycardia but was asymptomatic with a heart  rate in the 50s and no med changes were made.  A lipid profile here  showed a total cholesterol of 113, triglycerides 252, HDL 17, and LDL  46.  Consideration can be given to adding niacin as an outpatient.  Because of his diabetes, his Glucophage was on hold.   On November 25, 2007, Mr. Springs was ambulating without chest pain or  shortness of breath.  He had bilateral femoral bruits which were not  new, and denied claudication symptoms.  His cath site had no hematoma  and no ecchymosis.  He was evaluated by Dr. Daleen Squibb and considered stable  for discharge with close outpatient followup.  He was seen by cardiac  rehab as well, and a heart-healthy lifestyle was encouraged.   DISCHARGE INSTRUCTIONS:  His activity level is to be increased  gradually.  He is to call our office for any problems with the cath  site.  He is to follow up with Dr. Andee Lineman in our  office, we will call  him.  He is to follow up with Dr. Linna Darner as needed.   DISCHARGE MEDICATIONS:  1. Aspirin 81 mg daily.  2. Lisinopril 20 mg a day.  3. Metoprolol 25 mg b.i.d.  4. Glipizide 5 mg daily.  5. Metformin is on hold until October 28, 2007.  6. He is to take Plavix 75 mg daily for 5 days.  7. Sublingual nitroglycerin p.r.n.  8. Nu-Iron 150 mg b.i.d.  9. Meclizine 25 mg p.r.n.  10.Simvastatin 20 mg every day.      Theodore Demark, PA-C      Jesse Sans. Daleen Squibb, MD, [facility]  Electronically Signed    RB/MEDQ  D:  11/25/2007  T:  11/25/2007  Job:  [id number]   cc:   [facility]  Erasmo Downer, MD

## 2010-09-15 NOTE — Discharge Summary (Signed)
NAMESOL, ENGLERT                ACCOUNT NO.:  [id number]   MEDICAL RECORD NO.:  [id number]          PATIENT TYPE:  INP   LOCATION:  6529                         FACILITY:  [facility]   PHYSICIAN:  Doylene Canning. Ladona Ridgel, MD    DATE OF BIRTH:  1934/08/09   DATE OF ADMISSION:  07/07/2007  DATE OF DISCHARGE:  07/08/2007                               DISCHARGE SUMMARY   PRIMARY CARDIOLOGIST:  Learta Codding, MD,FACC.   PRIMARY CARE PHYSICIAN:  Erasmo Downer, M.D.   PROCEDURES PERFORMED DURING HOSPITALIZATION:  1. Cardiac catheterization to include renals.      a.     Three vessel CAD.      b.     Status post CABG with 3-4 patent grafts.      c.     Normal LV function.      d.     Patient has patent iliacs.  2. Successful PCI of right coronary artery with Vision bare metal      stent secondary to 80% stenosis proximally.  This is at 3 x 18.   FINAL DISCHARGE DIAGNOSES:  1. Coronary artery disease.      a.     Status post stenting of an 80% proximal right coronary       artery using Vision 3 x 18 mm stent for Dr. Excell Seltzer on July 07, 2007.      b.     Status post cutting balloon percutaneous transluminal       coronary angioplasty of an 80% end-stent left main restenosis in       July, 2004.      c.     Status post stenting 95% left main coronary artery stenosis       by Dr. Bonnee Quin in April, 2003.      d.     Residual 50% stenosis saphenous vein graft left anterior       descending artery graft at insertion site, atretic saphenous vein       graft diagonal graft, otherwise patent graft.  2. Preserved left ventricular ejection fraction.  3. Longstanding tobacco abuse.  4. Peripheral vascular disease.      a.     Ankle brachial index  0.77 on the right and 1.0 on the left       in 2004.  5. Type 2 diabetes.  6. Dyslipidemia.  7. Hypertension.   HOSPITAL COURSE:  This is a 76 year old Caucasian male with known  multivessel coronary artery disease and peripheral vascular disease  who  is status post bypass surgery in 2000 with subsequently stenting of the  left main artery.  The patient has been noncompliant with follow-up  appointments.  The patient presented to [facility] secondary to  chest pain and was admitted to rule out for myocardial infarction.  The  patient did rule out with all serial cardiac enzymes within normal  limits.  Dr. Lewayne Bunting was consulted for further evaluation.  The  patient was seen and examined by Dr. Andee Lineman and felt that the patient  would be best served to be transferred to [facility] for  diagnostic cardiac catheterization with possible PCI.   Patient was transferred to [facility] and was seen and examined  by Dr. Tonny Bollman secondary to his ongoing discomfort and did  undergo a cardiac catheterization.  Please see Dr. Earmon Phoenix thorough  cardiac catheterization notes for more details.  The patient was found  to have an 80% proximal right coronary artery lesion and was stented  with a drug-eluting stent, reducing the stenosis from 80% to 0%.  He had  a normal LVEF of 65%.  It is also found that the patient's renal aortas  and iliacs were patent without signs of aneurysm.   The patient tolerated the procedure well without evidence of hematoma,  bleeding, or signs of infection at the insertion site.  During  catheterization, the patient had no more ongoing chest discomfort.  He  was up walking in the halls without any difficulty or recurrence of  chest pain or shortness of breath.   PHYSICAL EXAMINATION:  On the day of discharge, the patient was seen and  examined by Dr. Lewayne Bunting and found to be stable for discharge.  Blood pressure 158/60, pulse 76, respirations 18.   DISCHARGE LABS:  Patient's hemoglobin was 11.9, hematocrit 35, white  blood cells 9.8, platelets 252.  Sodium 138, potassium 3.4, chloride  104, CO2 23, glucose 170, BUN 5, creatinine 0.77.  Cardiac enzymes:  Troponin 0.45, CK 83,  CK-MB 3.5.   DISCHARGE MEDICATIONS:  1. Aspirin 325 mg 1 p.o. daily.  2. Metoprolol 25 mg twice daily.  3. HCTZ 12.5 mg daily.  4. Lisinopril 10 mg daily.  5. Lipitor 80 mg at bedtime.  6. Plavix 75 mg daily.  7. Nitroglycerin 0.4 mg as needed for chest pain.  8. Metformin 500 mg twice daily to start on Sunday, July 09, 2007.   ALLERGIES:  No known drug allergies.   FOLLOW-UP PLANS/APPOINTMENTS:  1. Patient is to follow up with Dr. Lewayne Bunting in [place], as this is the      weekend our office will call to make an appointment.  2. Patient has been given smoking cessation instructions.  3. Patient has been given post cardiac catheterization instructions      with particular emphasis on the right groin site      for evidence of bleeding, hematoma, or signs of infection.  4. Patient is advised to follow up with his primary care physician for      continued medical management.   Time spent with the patient, to include physician time, 40 minutes.      Bettey Mare. Lyman Bishop, NP      Doylene Canning. Ladona Ridgel, MD  Electronically Signed    KML/MEDQ  D:  07/08/2007  T:  07/08/2007  Job:  [id number]   cc:   Erasmo Downer, MD  Learta Codding, MD,FACC

## 2010-09-15 NOTE — Assessment & Plan Note (Signed)
[facility]                          [facility] NOTE   Darin, Miller                       MRN:          [id number]  DATE:08/03/2007                            DOB:          October 25, 1934    PRIMARY CARDIOLOGIST:  Learta Codding, MD,FACC   REASON FOR PHYSICAL:  Post hospital follow-up.   [name] is a 76 year old male, with known coronary artery disease,  who was recently hospitalized at [facility] earlier this month.  In  fact, he actually returned to [facility] after being initially  discharged on July 08, 2007, following a percutaneous intervention of  the right coronary artery with a bare metal stent.   We actually saw [name] here at [facility] on July 06, 2007, for  symptoms worrisome for unstable angina pectoris.  Serial cardiac markers  were normal.  However, the patient had known multivessel coronary artery  disease, status post multivessel CABG in 2000, as well as stenting of a  high-grade left main artery stenosis in 2003, by Dr. Riley Kill.  Moreover,  he had in-stent restenosis of the left main stent, requiring cutting  balloon angioplasty, again by Dr. Riley Kill, in July 2004.   [name] underwent successful stenting, by Dr. Tonny Bollman, for  treatment of a high-grade proximal RCA lesion.  He employed a bare metal  stent, with recommendation to continue Plavix for 30 days.   Residual anatomy notable for 70% in-stent restenosis of the left main  stent site, but with continued wide patency of the LIMA - second  diagonal branch, the SVG - second obtuse marginal branch, and occlusion  of the SVG - first diagonal graft.  The SVG - LAD graft indicated  occlusion of the proximal LAD.   Left ventricular function was normal (EF 65%).  We also requested a  distal aortogram, revealing widely patent renal arteries and iliac  vessels.   The patient then was rehospitalized approximately 1 week later for  recurrent  chest pain, but ruled out with negative serial cardiac  markers.  He also presented with uncontrolled hypertension and diabetes.  It was also felt that his pain was dissimilar from that with which he  was previously hospitalized.   Clinically, the patient reports one episode of chest pain since his  second hospitalization, for which he took 1 nitroglycerin tablet with  relief.  He reports compliance with his medications but, for reasons  which he cannot explain, he is not on the Lipitor 80 daily which was  indicated at time of discharge.  Unfortunately, he also continues to  smoke.   Electrocardiogram today reveals sinus bradycardia at 61 bpm with normal  axis and chronic inferolateral T-wave abnormalities.   CURRENT MEDICATIONS:  1. Plavix 75 daily.  2. Aspirin 81 daily.  3. Lisinopril 20 daily.  4. Metoprolol 25 b.i.d.  5. Lisinopril/hydrochlorothiazide 10/12.5 daily.  6. Glipizide ER 5 daily.   PHYSICAL EXAMINATION:  VITAL SIGNS:  Blood pressure 126/65, pulse 64,  regular.  Weight 194.8.  GENERAL:  A 76 year old male sitting upright in no distress.  HEENT:  Normocephalic,  atraumatic.  NECK:  Palpable bilateral carotid pulses with bilateral bruits (right  greater than left).  LUNGS:  Clear to auscultation in all fields.  HEART:  Regular rate and rhythm (S1-S2).  No significant murmurs, no  rubs.  ABDOMEN:  Soft, nontender, normoactive bowel sounds.  EXTREMITIES:  Stable right groin with no ecchymosis or hematoma.  There  are palpable bilateral femoral pulses with bilateral bruits.  Intact  distal pulses without edema.  NEURO:  No focal deficits.   IMPRESSION:  1. Multivessel coronary artery disease.      a.     Status post recent bare metal stenting of high-grade       proximal right coronary artery stenosis.      b.     Residual 70% left main in-stent restenosis, with distal       filling of the left anterior descending from graft flow.      c.     Patent bypass grafts,  except for 100% occlusion of saphenous       vein graft - first diagonal branch.      d.     Three-vessel coronary artery bypass grafting in 2000.      e.     Status post stenting of 95% left main artery stenosis, April       2003:  cutting balloon PTCA of 80% in-stent restenosis, July       2004.  2. Preserved left ventricular function.  3. Bilateral femoral bruits.      a.     No significant distal abdominal atherosclerosis by recent       catheterization.      b.     Ankle-brachial index 0.77 right; 1.0 left, in 2004.  4. Bilateral carotid bruits.  5. Chronic obstructive pulmonary disease/ongoing tobacco.  6. Type 2 diabetes mellitus.  7. Dyslipidemia.  8. Hypertension.   PLAN:  1. Adjust current medication regimen with the addition of isosorbide      20 mg b.i.d., for suppression of breakthrough angina pectoris.  2. Add simvastatin 20 mg nightly for aggressive lipid management.      Target LDL goal of 70, or less.  3. Schedule carotid Dopplers to exclude significant cerebrovascular      disease.  4. Schedule patient for followup next week with his primary care      physician, Dr. Regina Eck, for close monitoring of his diabetes mellitus.  5. Schedule a return clinic follow-up with myself and Dr. Andee Lineman in 6      months.      Darin Searing, [name]  Electronically Signed      Darin Sidle, MD  Electronically Signed   GS/MedQ  DD: 08/03/2007  DT: 08/03/2007  Job #: [id number]

## 2010-09-15 NOTE — Cardiovascular Report (Signed)
NAMEAMRON, GUERRETTE                ACCOUNT NO.:  [id number]   MEDICAL RECORD NO.:  [id number]          PATIENT TYPE:  INP   LOCATION:  2925                         FACILITY:  [facility]   PHYSICIAN:  Arturo Morton. Riley Kill, MD, FACCDATE OF BIRTH:  17-Jun-1934   DATE OF PROCEDURE:  11/22/2007  DATE OF DISCHARGE:                            CARDIAC CATHETERIZATION   [name] is a 76 year old well known to me.  He has a somewhat complicated  history.  He had bypass surgery in 2000.  2003, we did a left main stent  to protect his diagonal territory after he had occlusion of his small  vein graft to that territory.  He had remained stable but presented  earlier this year and had stenting with a non-drug-eluting platform by  Dr. Excell Seltzer.  He now presents with borderline enzymes, positive symptoms.  The patient does tell us through his family that he had some hospital  admission at some point for microcytic anemia and required transfusion.  He received only a month of Plavix, and the exact etiology of stent  selection is in some respects probably mediated by some of these  factors.  He was brought to the cath lab for further evaluation.   PROCEDURE:  1. Left heart catheterization.  2. Selective coronary arteriography.  3. Selective left ventriculography.  4. Saphenous vein graft angiography.  5. Selective left internal mammary angiography.   DESCRIPTION OF PROCEDURE:  The patient was brought to the cath lab and  prepped and draped in the usual fashion.  Through an anterior puncture,  the femoral artery was easily entered, a 5-French sheath was then  placed.  We took views of the left coronary artery, right coronary  artery, saphenous vein grafts, and then injected the internal mammary,  central aortic and left ventricular pressures with a pigtail.  Ventriculography was performed in the RAO projection.  We then reviewed  the films carefully, and then I went to talk to his family.  After  thorough  discussions with the family, I elected not to proceed at the  present time with percutaneous intervention until we could better figure  out the current status.  Of note, the patient has had hospitalization  for some sort of anemia requiring transfusion which is not readily  apparent to [place] at the present time.  He also does have a microcytic  anemia.  The patient had high-grade in-stent restenosis probably  requiring drug-eluting stent, but he may not be a very good candidate  given these findings.  As a result, he was taken off the table and into  holding area for further evaluation.   HEMODYNAMIC DATA:  1. Central aortic pressure 179/69, mean 106.  2. Left ventricular pressure, 148/30.  3. There was no gradient or pullback across the aortic valve.   ANGIOGRAPHIC DATA:  1. Ventriculography done in the RAO projection revealed well-preserved      global systolic function with ejection fraction of greater than      55%.  2. The left main has diffuse in-stent narrowing, but it is relatively      smooth  with relatively good flow.  There is 70% narrowing.  This      leads into a very large diagonal branch with 3 sub-branches.  There      is modest luminal irregularity.  There is also a septal perforator      coming off of this.  3. The saphenous vein graft to the diagonal is occluded.  4. The circumflex is severely diseased proximally.  5. The saphenous vein graft to the distal LAD is widely patent, fills      retrograde into 2 septal perforators and the major diagonal.  6. The saphenous vein graft to the OM is widely patent, fills      retrograde into the AV circumflex with 50% narrowing in the obtuse      marginal.  7. The right coronary artery demonstrates long in-stent restenosis in      the proximal mid vessel.  This is about 90%.  There is then 50%      narrowing in the large distal right supplied by this territory.  8. The internal mammary to the second diagonal appears to be  widely      patent with good flow.   CONCLUSIONS:  1. Preserved left ventricular function.  2. Continued patency of the internal mammary to the second diagonal.  3. Continued patency of the saphenous vein graft to the distal LAD.  4. Continued patency of the saphenous vein graft to the obtuse      marginal.  5. Continued patency of the previously placed stent in the left main      leading into the diagonal branch.  6. High-grade in-stent restenosis of the right coronary artery.  7. Significant microcytic anemia with recent history of probable GI      blood loss.   PLAN:  1. We will return the patient to the floor, start Plavix and low-dose      heparin.  We will also add intravenous nitroglycerin.  2. I will reassess the situation when records can be obtained from      [facility] regarding his GI admission.  3. We will discuss various strategies.  Current potential strategies      include either a drug-eluting stent for treatment of in-stent      restenosis.  The other alternative would be to consider cutting      balloon angioplasty which would have a high rate of target vessel      revascularization; however, this may be the best strategy and      potentially could heal.  I will discuss the options with my      partners.      Arturo Morton. Riley Kill, MD, [facility]  Electronically Signed     TDS/MEDQ  D:  11/22/2007  T:  11/23/2007  Job:  [id number]   cc:   Rozell Searing, PA-C  Learta Codding, MD,FACC  Jonelle Sidle, MD  CV Laboratory

## 2010-09-15 NOTE — H&P (Signed)
[name], BEAMER NO.:  [id number]   MEDICAL RECORD NO.:  [id number]          PATIENT TYPE:  EMS   LOCATION:  MAJO                         FACILITY:  [facility]   PHYSICIAN:  Lorain Childes, MD DATE OF BIRTH:  [birth year]   DATE OF ADMISSION:  07/16/2007  DATE OF DISCHARGE:                              HISTORY & PHYSICAL   PRIMARY CARDIOLOGIST:  __________   CHIEF COMPLAINT:  Chest pain.   HISTORY OF PRESENT ILLNESS:  The patient is a 76 year old gentleman with  known coronary artery disease status post CABG with multiple PCIs with  weakened PCI with RCA on July 07, 2007,with a bare metal pin placement.  The patient now has come to the emergency room with chest pain beginning  today.  The patient states that he did fairly well following his  intervention and has had no significant chest pain until today.  He woke  up this afternoon around 3:30 and began having chest pain described as  heaviness in his chest.  He rated it as a 7 out of 10.  The last 10 to  15 minutes, he denied any shortness of breath , however  the pain did  radiate down his left arm.  He denied any nausea, vomiting or  diaphoresis.  He took 2 sublingual nitroglycerins for his discomfort  which decreased it to a 2/10.  The pain came back later on this evening,  so then he was brought in by his family for further evaluation.  He has  slight discomfort currently.   PAST MEDICAL HISTORY:  1. CAD status post CABG with a LIMA to the D2, vein graft to LAD, vein      graft to D1, vein graft to OM.  He has had PCI of his left main in      2003.  He then had cutting balloons to an 80% instant restenosis in      2004.  He then underwent PCI of his RCA.  He had 80% stenosis that      was taken to 0% with revision 3 x 8 mm sent on July 07, 2007.  He      has preserved LV function with an EF of 65% per his last cath.  2. Tobacco abuse which is ongoing.  3. Peripheral vascular disease with ABI of 0.77 on  the right and 1.0      on the left.  4. Diabetes.  5. Hypertension.  6. Dyslipidemia.   SOCIAL HISTORY:  He is currently living with one of his daughters since  he has been ill.  He has a 50+ pack-year history of tobacco which is  ongoing.  He has had no alcohol for the past 30 or 40 years.  Denies any  drugs.   FAMILY HISTORY:  Noncontributory.   MEDICATIONS:  1. Aspirin 325 mg p.o. daily.  2. Plavix 75 mg p.o. daily.  3. Metoprolol 25 mg p.o. b.i.d.  4. Sublingual nitroglycerin.  5. He has not been taking his hydrochlorothiazide, Lisinopril or      Metformin.  There was some  confusion related to his medication at      the time of discharge.   ALLERGIES:  HE HAS NO KNOWN DRUG ALLERGIES, however, he did experience  some nausea with morphine when he was here last time.   FAMILY HISTORY:  Noncontributory.   REVIEW OF SYSTEMS:  He denies any fevers or chills, headaches or visual  changes.  No skin rashes.  He does have a tender point in his left  breast above his nipple where he appears to have an ingrown hair.  CARDIOPULMONARY:  As stated in the HPI. Marland Kitchen  He denied any dyspnea on  exertion, no orthopnea, no PND, no edema, occasional palpitation, no  syncopal event, occasional coughing but no wheezing.  He denies any  dysuria or hematuria, no focal weakness.  GI:  He denies any diarrhea,  no bright-red blood per rectum, no melena.  He does feel occasional  heartburn, however, they are infrequent and different during his current  pain.  All other systems were negative.   PHYSICAL EXAMINATION:  Temperature 97.3, pulse 56, respirations 18,  blood pressure 134/57, 100% on 2 liters.  In general, he is an elderly man comfortable in no acute distress.  HEENT:  Normocephalic, atraumatic.  NECK:  JVD is approximately 7 cm.  He has a soft left carotid bruit  noted.  CARDIOVASCULAR:  Normal S1, S2, regular rhythm.  His PMI is  nondisplaced.  He has a right femoral bruit present.  No  palpable mass.  LUNGS:  Clear throughout.  ABDOMEN:  Soft, nontender, no organomegaly.  EXTREMITIES:  He has no edema.  NEUROLOGIC:  He is nonfocal.  SKIN:  It appears that he has an ingrown hair on his left nipple region.   CHEST X-RAY:  Shows no acute airways disease.   EKG:  sinus rhythm.  PR interval 24 msec.  QRS is 92 msec.  QTC is 434  msec.  He has ST depression inferiorly and laterally.  So I, AVL, V4  through V6 and also II, III, and AVF is more pronounced than it was on  his last EKG dated July 08, 2007.   LABORATORY DATA:  White count of 8.0, hematocrit of 36, platelets of  416, potassium of 3.6, creatinine 0.7, glucose 222, CK-MB of less than  1.0, troponin-I less than 0.05, myoglobin 35.6.   ASSESSMENT/PLAN:  1. The patient is a 76 year old gentleman with known coronary disease      status post coronary artery bypass graft and multiple PCIs and most      recent PCI of his RCA was on July 07, 2007.  He now presents with      typical angina.  2. Coronary artery disease.  The patient has unstable angina. We will      cycle cardiac enzymes.  Follow his EKG.  Will plan on cardiac      catheterization tomorrow.  Will continue aspirin and Plavix, beta-      blocker, statin, Lovenox and nitrates.  3,  Diabetes.  Will cover him with sliding scale insulin.  For now, we  will hold Metformin which he has not been taking as he is going to  undergo a heart catheterization.  1. Tobacco abuse.  This is ongoing.  I have discussed complete      cessation in great detail this evening.  We will continue to      counsel him throughout his hospitalization.  2. Hypertension.  Will titrate the above medications.  3. Right  femoral bruit.  We will obtain an ultrasound to rule out a      pseudoaneurysm as he has had a recent cardiac catheterization.      Lorain Childes, MD  Electronically Signed     CGF/MEDQ  D:  07/17/2007  T:  07/17/2007  Job:  [id number]

## 2010-09-18 NOTE — Cardiovascular Report (Signed)
[name]  Patient:    [name], [name] Visit Number: [id number] MRN: [id number]          Service Type: MED Location: [phone] Attending Physician:  Talitha Givens Dictated by:   Arturo Morton Riley Kill, M.D. [facility] Admit Date:  08/01/2001 Discharge Date: 08/05/2001   CC:         Dr. Clelia Croft  Dr. Lawana Pai Burgess Estelle, M.D.   Cardiac Catheterization  INDICATIONS:  Mr. [name] is a 76 year old who underwent revascularization surgery in 2000.  He now presents with recurrent chest pain.  The current study was done to assess coronary anatomy.  PROCEDURES: 1. Left heart catheterization. 2. Selective coronary arteriography. 3. Selective left ventriculography. 4. Saphenous vein graft angiography x3. 5. Selective left internal mammary angiography.  DESCRIPTION OF PROCEDURE:  The procedure was performed from the right femoral artery using 6-French catheters.  He tolerated the procedure well.  I reviewed the old films as well as the current studies.  The patient was taken to the holding area in satisfactory, clinical condition.  HEMODYNAMIC DATA: 1.  Central aorta: 164/89. 2.  Left ventricular: 170/18. 3.  No aortic left ventricular gradient.  ANGIOGRAPHIC DATA: 1.  The left main coronary artery has about a 95% stenosis prior to the origin     which is segmental.  This leads into a first diagonal branch.  The     circumflex proper has about a 70% proximal narrowing. 2.  The saphenous vein graft into the distal LAD has about 30-40% narrowing     at its insertion.  However, it appears to be relatively widely patent.     There was some damping initially with injection of the left anterior     descending graft, but fortunately, this was relieved with nitroglycerin. 3.  The internal mammary is somewhat small in caliber and does supply a second     diagonal which connects to the LAD proper.  This is also intact. 4.  The saphenous vein graft  to the first diagonal is severely diseased with     slow flow.  There is faint distal antegrade flow. 5.  The vein graft to the obtuse marginal is widely patent.  There is about     30% ostial narrowing and there was some damping with initial injection. 6.  The right coronary artery is a dominant vessel that is not bypassed.     There is about a 50% mid stenosis and about a 70% area between the origin     of the posterior descending and posterolateral branch. 7.  Ventriculography in the RAO projection reveals preserved global systolic     function.  CONCLUSIONS: 1. Patency of the internal mammary to the second diagonal. 2. Patency of the saphenous vein graft to the distal LAD. 3. Severe disease with diffuse insertion disease of the saphenous vein graft    to the first diagonal as well as progression of tight left main disease. 4. Continued patency in the saphenous vein graft to the OM. 5. Moderate progression of disease in the right coronary artery.  DISPOSITION: 1. Continue medication with resumption of home medications. 2. Discuss possible options with patient including PCI of a protected    left main. Dictated by:   Arturo Morton Riley Kill, M.D. [facility] Attending Physician:  Talitha Givens DD:  08/02/01 TD:  08/03/01 Job: [id number]

## 2010-09-18 NOTE — Discharge Summary (Signed)
[place]  Patient:    MASIN, SHATTO Visit Number: [id number] MRN: [id number]          Service Type: MED Location: [id number] Attending Physician:  Talitha Givens Dictated by:   Delton See, P.A. Admit Date:  08/01/2001 Disc. Date: 08/05/01   CC:         Sandy Salaam, M.D., [address], [place], [place] [address]  [facility] [place] [place] [address]  [address], [place], [place] [address]   Referring Physician Discharge [place]  DATE OF BIRTH:  Feb 22, 2035  HISTORY OF PRESENT ILLNESS:  Mr. Bonaventure is a 76 year old male who was admitted to [facility] on August 01, 2001, after he awoke with chest tightness.  He has no prior history of coronary artery disease. Apparently he recently had a Cardiolite that was positive for ischemia and it revealed an ejection fraction of 44%.  The patient is status post coronary artery bypass graft surgery in 2000.  He has a history of hypertension.  He has recently diagnosed diabetes mellitus.  PAST MEDICAL HISTORY:  Please see above.  The patient also has a history of remote peptic ulcer disease in the 1970s.  He has hyperlipidemia.  SOCIAL HISTORY:  The patient lives in [place].  He is married.  He raises produce.  He has two grown children.  He smoked one pack per day for many years.  Now he smokes four to eight cigarettes per day.  HOSPITAL COURSE:  As noted, this patient was admitted to [facility] for further evaluation of chest pain and a recent positive Cardiolite with a previous history of coronary artery bypass graft surgery which consisted of a LIMA to the second diagonal, saphenous vein graft to the LAD, saphenous vein graft to the first diagonal, and a saphenous vein graft to the obtuse marginal.  The patient underwent cardiac catheterization on August 02, 2001, performed by Maisie Fus D. Riley Kill, M.D.  He was found to have a 95% lesion of the left main coronary  artery, as well as other diffuse coronary artery disease.  Please see the complete catheterization dictation for full details.  He had a normal left ventricle.  On August 04, 2001, the patient underwent PTCA and stenting of the left main, reducing the lesion from 95% to 0%.  It was recommended that he stay on Plavix for one year.  He tolerated the procedure well.  Arrangements were made to discharge the patient the following day in improved condition.  LABORATORY DATA:  On the day of discharge, the BUN was 8, creatinine 0.8, potassium 3.4, and glucose 213.  A CBC revealed a hemoglobin of 12.3, hematocrit 35.1, WBC 8000, and platelets 251,000.  The hemoglobin A1C was 9.9. The CK-MB enzymes were negative.  The patients potassium was supplemented and arrangements were made to proceed with discharge.  As noted, he is to be on Plavix for one year.  DISCHARGE MEDICATIONS: 1. Enteric-coated aspirin 325 mg daily. 2. Plavix 75 mg daily for one year. 3. Lopressor 50 mg one-half tablet b.i.d. 4. Lipitor as previously taken. 5. Imdur 30 mg daily. 6. Nitroglycerin p.r.n. for chest pain.  SPECIAL INSTRUCTIONS:  The patient was unsure of his home medications.  He was told to bring all medications to his next office visit.  ACTIVITY:  The patient was told to avoid any strenuous activity or driving for two days.  DIET:  He was to be  on a low-salt, no sweets, low-fat diet.  WOUND CARE:  He was told to call the [facility] office if he had any increased pain, swelling, or bleeding from his groin.  FOLLOW-UP:  He was to have a potassium level at the next office visit.  He was to see Sandy Salaam, M.D., within a week or two.  He was told to call for an appointment.  Our office will call him for an appointment in [place], [place].  PROBLEM LIST AT THE TIME OF DISCHARGE: 1. Status post percutaneous coronary intervention stent of the left main    coronary artery disease as noted above.   Normal left ventricle by    catheterization. 2. Diabetes mellitus newly diagnosed. 3. Coronary artery bypass graft surgery in 2000.  Please see grafts as noted    above. 4. History of hypertension. 5. Recent Cardiolite positive for ischemia with an ejection fraction of 44%    prior to his intervention. 6. Hypokalemia supplemented. 7. History of hyperlipidemia. 8. Remote history of peptic ulcer disease. Dictated by:   Delton See, P.A. Attending Physician:  Talitha Givens DD:  08/05/01 TD:  08/05/01 Job: [id number]

## 2010-09-18 NOTE — Cardiovascular Report (Signed)
NAMEDAXTON, NYDAM                          ACCOUNT NO.:  [id number]   MEDICAL RECORD NO.:  [id number]                   PATIENT TYPE:  OIB   LOCATION:  6523                                 FACILITY:  [facility]   PHYSICIAN:  Arturo Morton. Riley Kill, M.D.             DATE OF BIRTH:  08-02-1934   DATE OF PROCEDURE:  11/09/2002  DATE OF DISCHARGE:  11/10/2002                              CARDIAC CATHETERIZATION   INDICATIONS:  [name] is well known to [place].  He has had prior  revascularization surgery.  In March of 2003, he underwent stenting of the  left main coronary artery for protected left main.  He came back with some  recurrent chest discomfort.  He had a Cardiolite which shows some inferior  ischemia.  He does have some disease in the right coronary artery, but it  does not appear to be critical.  He also had evidence of stent restenosis in  his previously placed left main stent.  As a result, it was felt that this  should be approached.  We initially talked about radiation, but there is a  poor landing zone for radiation in the large diagonal.   PROCEDURES:  Percutaneous angioplasty of the left main stent.   DESCRIPTION OF PROCEDURE:  The patient was brought to the catheterization  lab and prepped and draped in the usual fashion after informed consent.  Using an anterior puncture, the left femoral artery was easily entered.  A 7  French sheath was placed.  A JL-4 guiding catheter was then inserted and  Angiomax given with an ACT appropriately designated.  The lesion was then  crossed with a Hi-Torque Floppy wire ensuring not to pass the wire too  distally because of the diffuse disease in the mid vessel.  The lesion was  then treated using a  10 x 2.75 cutting balloon with  marked improvement in the appearance of the  artery.  We then took a 3.25 x 12 Quantum Maverick balloon up to about 12  atmospheres across the lesion.  This resulted in an excellent angiographic  response.   All catheters were subsequently removed and the femoral sheath  into place.  The patient was taken to the holding area in satisfactory  clinical condition.   ANGIOGRAPHIC DATA:  1. The left main has an 18-mm Zeta stent.  The proximal portion of the stent     shows some diffuse stent renarrowing, but beyond the take off of the left     main there is only about 30-40%.  There is a moderate amount of luminal     irregularity in the diagonal leading into this and there are several     branches, all of which have a fair amount of irregularity.  However, this     was unchanged from previous studies.  The circumflex system demonstrates     about 70% narrowing proximally then trifurcates  into three branches.  The     middle branch has a flush-fill phenomenon due to what appears to be a     retrograde filling from a graft.  Following the cutting balloon     angioplasty and balloon angioplasty the in-stent restenosis was reduced     from 80% to 0% with TIMI-3 flow and an excellent angiographic response.   CONCLUSIONS:  Successful percutaneous angioplasty of the in-stent  restenosis.  The patient is stable.  He was thought to have inferior  ischemia, but on his diagnostic study the right did not appear to be  critical.  We would hopefully recommend continued medical program including  Plavix.  If he continues to have discomfort, then potentially the right  could be treated.  I reviewed the films with his family and previously with  Dr. Diona Browner and the right currently does not appear to be critical.                                                 Arturo Morton. Riley Kill, M.D.    TDS/MEDQ  D:  11/09/2002  T:  11/11/2002  Job:  [id number]  Darin Peri, MD  [address]  [place] [address]  Fax: [phone]   [name], M.D. [facility]   cc:   Darin Peri, MD  [address]  [place] [address]  Fax: [phone]   [name], M.D. [facility]

## 2010-09-18 NOTE — Cardiovascular Report (Signed)
[place]. [facility]  Patient:    [name] Visit Number: [id number] MRN: [id number]          Service Type: MED Location: CCUB [id number] Attending Physician:  Junious Silk Proc. Date: 02/18/99 Admit Date:  02/17/1999   CC:         Weyman Pedro, M.D.  Rollene Rotunda, M.D. [facility], [place], [place]  Catheterization Lab   Cardiac Catheterization  PROCEDURE: 1. Left heart catheterization. 2. Coronary angiography. 3. Left ventriculography. 4. Abdominal aortography.  INDICATIONS:  Mr. [name] is a 76 year old male admitted with unstable angina.  DESCRIPTION OF PROCEDURE:  A 6 French sheath was placed in the right femoral artery.  Standard Judkins 6 French catheters were utilized.  Contrast was Omnipaque.  There were no complications.  RESULTS:  HEMODYNAMICS:  Left ventricular pressure 192/21.  Aortic pressure 168/80. There is no aortic valve gradient.  LEFT VENTRICULOGRAM:  Wall motion is normal.  Ejection fraction is calculated at 65%.  There is no mitral regurgitation.  The left subclavian artery has very minimal atherosclerotic disease with a patent internal mammary artery.  ABDOMINAL AORTOGRAM:  The abdominal aortogram revealed a 50% stenosis in the left renal.  There were two right renal arteries, the superior of which has a 40% stenosis.  There is mild diffuse plaque in the bilateral iliac arteries.  CORONARY ARTERIOGRAPHY:  Right dominant system.  The left main has an ostial 30% stenosis and a distal 80% stenosis.  The left anterior descending has a proximal 25% followed by a 30% stenosis. There is a large branching first diagonal.  Just after this first diagonal is a very complex 80% stenosis followed by an ectatic segment followed by a 25% stenosis.  There is a small second and small third diagonal branch.  The left circumflex has a proximal very eccentric stenosis.  There is a small OM-1, small OM-2 and a  large OM-III with diffuse 25% stenosis and a small OM-4.  The right coronary artery is a large dominant vessel.  It has a 25% stenosis proximally and a 30% stenosis in the mid vessel.  It gives rise to a large branching posterior descending artery, small first and second posterior lateral  branch and a normal size third posterior lateral branch.  IMPRESSION: 1. Normal left ventricular systolic function. 2. Significant left main and two vessel coronary artery disease involving    the left circumflex and left anterior descending artery.  PLAN:  Cardiovascular surgery consultation. Attending Physician:  Junious Silk DD:  02/18/99 TD:  02/18/99 Job: [id number]

## 2010-09-18 NOTE — H&P (Signed)
[name], [name]                          ACCOUNT NO.:  [id number]   MEDICAL RECORD NO.:  [id number]                   PATIENT TYPE:  OIB   LOCATION:  NA                                   FACILITY:  [facility]   PHYSICIAN:  Arturo Morton. Riley Kill, M.D.             DATE OF BIRTH:  01-Jun-1934   DATE OF ADMISSION:  11/09/2002  DATE OF DISCHARGE:                                HISTORY & PHYSICAL   HISTORY OF PRESENT ILLNESS:  A 76 year old black male with coronary artery  disease, hyperlipidemia, hypertension, atherosclerotic peripheral vascular  disease.   The patient had a recent nuclear study at [facility] which was  positive for ischemia through Dr. Margaretmary Eddy office at [facility].  Based on this, he was transferred to [facility] and underwent heart  catheterization 10/24/2002, by Dr. Shawnie Pons without complications.  The  patient is status post CABG in October 2000.  He had a saphenous vein graft  to the distal LAD, saphenous vein graft to the first diagonal, and  supraventricular tachycardia to the obtuse marginal.  He is status post  stenting to the left main coronary artery  one year ago.   His catheterization showed that his exact EF could not be calculated because  of a post PVC beat; however, it was estimated to be in the 50 to 55% range.  The left main was previously stented and showed a 70 to 80% stenosis in the  area representing a diffuse in-stent restenosis.  More distally, the vessel  provided a large diagonal branch with multiple branches.  There was a fair  amount of luminal irregularity.  The left anterior descending itself is  subtotally occluded.  The saphenous vein graft to the distal LAD was intact  as on the previous study.  There appeared to be about a 30 to 50% narrowing  at the insertion, but the flow appeared to be good.  The internal mammary  artery to the second diagonal also appeared to be intact.  The saphenous  vein graft to the first  diagonal was more or less atretic with slow filling.  This does not fill a significant distal myocardium.  The circumflex had a  70% proximal narrowing that leads into an A-V circumflex with mild diffuse  disease at about 70%.  There was a second marginal branch stenosis of 70%.  The saphenous vein graft to the OM is widely patent with about a 30% mid  narrowing.  The vein graft itself fills well.  The right coronary artery is  a dominant vessel and not bypassed.  There is about a 50% stenosis area of  segmental plaque seen in the mid vessel followed 60 to 70% stenosis between  the origins of the posterior descending and posterolateral system.  This  appears unchanged from his previous study.   The films were reviewed by Dr. Riley Kill and his colleagues.  Ischemia  on the  Cardiolite was in the inferior segment.  The pain is somewhat atypical.  However, there appeared to be pretty substantial in-stent restenosis and  dilatation of this vessel.  Brachytherapy was to be considered.  This was  set up for November 06, 2002; however, they were unable to get in touch with the  patient.   [name] presents to the office today not complaining of any chest  discomfort but confused as to what his next step would be.  I talked with  Dr. Riley Kill via phone, and the plans will be to readmit him back to the  hospital for intervention.  Therefore, [name] was given a prescription  for Plavix, because he had only received 6 pills, was told to continue his  current medications, and he will be set up for intervention.  It is noted  that he is not on a statin which he had previously been on.  He will be  admitted to [facility] electively 11/09/2002, at 9:30 in the morning for  intervention at 11:30 with Dr. Riley Kill.  The patient agrees, understands,  and accepts the risks and benefits of the explained procedure.  He has no  complaints of left groin discomfort.  He does have vascular disease to his  right  lower extremity, so this was not used at his previous catheterization.   PAST MEDICAL HISTORY:  1. Coronary artery disease as outlined above.  2. History of hypertension.  3. Hyperlipidemia.  4. Atherosclerotic peripheral vascular disease to the right lower extremity.  5. History of remote peptic ulcer disease in the 1970s.   SOCIAL HISTORY:  He lives in [place].  His wife has had a CVA.  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  In addition, he has been  working I believe for [employer] in an environment with no heat or air  conditioning.  He has two grown children.  He smoked a pack a day for  several years and had cut down to four to eight cigarettes per day.   CURRENT MEDICATIONS:  1. Enteric-coated aspirin 325 mg p.o. daily.  2. Metoprolol 50 mg p.o. b.i.d.  3. Altace 2.5 mg p.o. daily.  4. Isosorbide 30 mg daily.  5. Plavix 75 mg daily.   ALLERGIES:  No known drug allergies.   FAMILY HISTORY:  Noncontributory.  There is no family history of premature  coronary artery disease.   PHYSICAL EXAMINATION:  VITAL SIGNS:  Pulse 70, blood pressure 164/88.  GENERAL:  Pleasant, well-developed, euthyroid-appearing black male in no  acute distress.  SKIN:  Warm and dry.  NECK:  Carotids are equal and strong bilaterally without bruits, detectable  thyromegaly, or adenopathy.  LUNGS:  Clear to auscultation and percussion with good bilateral chest  expansion.  HEART:  Rhythm regular.  Normal S1, S2, without any murmurs, rubs, or  clicks.  Sternal scar is noted.  No S3 or S4 gallop.  ABDOMEN:  Soft, nontender.  Bowel sounds are present.  EXTREMITIES:  Without edema.  Left groin is stable post catheterization with  small, dime-size knots.  Pedal pulses are present left greater than right  lower extremity.   IMPRESSION:  1. Coronary artery disease status post coronary artery bypass grafting in    October 200, status post stenting to his left main coronary artery in      April 2003, now with recurrent chest discomfort, positive nuclear study,     and catheterization documented restenosis  of 70 to 80% of his left main.  2. Hypertension.  3. Hyperlipidemia.  4. Tobacco abuse.  5. Atherosclerotic peripheral vascular disease.  6.     Remote history of peptic ulcer disease.  7. Frequent ventricular ectopy in the form of unifocal premature ventricular     contractions.   PLAN:  The patient is being admitted electively for intervention to his left  main with stenting by Dr. Bonnee Quin.     Suszanne Conners. Julious Oka.                    Arturo Morton. Riley Kill, M.D.    MRD/MEDQ  D:  11/07/2002  T:  11/07/2002  Job:  [id number]   cc:   Dr. Sherryll Burger    cc:   Dr. Sherryll Burger

## 2010-09-18 NOTE — Cardiovascular Report (Signed)
[name], [name]                          ACCOUNT NO.:  [id number]   MEDICAL RECORD NO.:  [id number]                   PATIENT TYPE:  OIB   LOCATION:  2866                                 FACILITY:  [facility]   PHYSICIAN:  Arturo Morton. Riley Kill, M.D.             DATE OF BIRTH:  1934/11/27   DATE OF PROCEDURE:  10/24/2002  DATE OF DISCHARGE:                              CARDIAC CATHETERIZATION   INDICATIONS:  The patient is a 76 year old gentleman who presents with an  episode of chest pain.  Cardiolite was abnormal with some inferior ischemia.  He underwent left main stenting approximately one year ago.  The current  study was done to assess coronary anatomy.   PROCEDURE:  1. Left heart catheterization.  2. Selective coronary arteriography.  3. Selective left ventriculography.  4. Saphenous vein graft angiography x3.  5. Selective left internal mammary angiography x1.   DESCRIPTION OF PROCEDURE:  The procedure was performed from the right  femoral artery using 6-French catheters.  He tolerated the procedure well  and there were complications.  The blood pressure was significantly elevated  because patient did not take his Lopressor this morning and he was given two  doses of intravenous labetalol as well as one dose of 5 mg of intravenous  Lopressor.  He was taken to the holding area in satisfactory clinical  condition.   HEMODYNAMIC DATA:  1. Central aorta 189/97, mean 132.  2. Left ventricle 191/28.  3. No gradient on pullback across the aortic valve.   ANGIOGRAPHIC DATA:  1. Ventriculography was performed in the RAO projection on a post PVC beat.     Overall systolic function appeared to be well preserved.  However, exact     ejection fraction could not be calculated.  It would be estimated in the     50-55% range.  2. The left main is previously stented and has about a 70-80% area of     diffuse instent restenosis.  More distally the vessel provides a large     diagonal  branch with multiple branches.  There is a fair amount of     luminal irregularity.  The LAD itself is subtotally occluded.  3. The saphenous vein graft to the distal LAD is intact.  As on the previous     study, there appears to be about 30-50% narrowing at the insertion but     flow appears to be good.  4. The internal mammary to the second diagonal also appears to be intact.  5. There is a vein graft to the first diagonal that is more or less atretic     with slow filling.  This does not fill a significant distal myocardium.  6. The circumflex has about 70% proximal narrowing that leads into an AV     circumflex with mild diffuse disease at about 70%.  There is a second  marginal branch stenosis of about 70%.  7. The saphenous vein graft to the OM is widely patent with about 30% mid     narrowing.  The vein graft itself fills well.  8. The right coronary artery is a dominant vessel and is unbypassed.  There     is about a 50% area of segmental plaquing in the mid vessel followed by a     60-70% stenosis between the origins of the posterior descending and     posterolateral system.  This appears unchanged from the previous study.   CONCLUSION:  1. Preserved overall left ventricular function.  2. Continued patency of internal mammary to second diagonal.  3. Continued patency of the saphenous vein graft to the left anterior     descending.  4. Continued patency of the saphenous vein graft to the obtuse marginal.  5. Slow filling of the saphenous vein graft to the V1.  6. Partial restenosis of the stent to the large proximal diagonal system.   DISPOSITION:  I plan to review the films with my colleagues.  His ischemia  on the Cardiolite is in the inferior segment.  The pain was somewhat  atypical.  However, there is pretty substantial instent restenosis and  dilatation of this with possible brachytherapy will be considered.  I will  review with my colleagues.  The patient is  diabetic.                                                Arturo Morton. Riley Kill, M.D.    TDS/MEDQ  D:  10/24/2002  T:  10/24/2002  Job:  [id number]  Jonelle Sidle, M.D. [facility]   CV Lab   Kirstie Peri  [address]  [place] [address]  Fax: [phone]   cc:   Jonelle Sidle, M.D. [facility]   CV Lab   Kirstie Peri  [address]  [place] [address]  Fax: [phone]

## 2010-09-18 NOTE — Discharge Summary (Signed)
[name], [name]                          ACCOUNT NO.:  [id number]   MEDICAL RECORD NO.:  [id number]                   PATIENT TYPE:  OIB   LOCATION:  6523                                 FACILITY:  [facility]   PHYSICIAN:  Arturo Morton. Riley Kill, M.D.             DATE OF BIRTH:  11-09-1934   DATE OF ADMISSION:  11/09/2002  DATE OF DISCHARGE:  11/10/2002                           DISCHARGE SUMMARY - REFERRING   PROCEDURES:  1. Cardiac catheterization.  2. Coronary arteriogram.  3. Left ventriculogram.   HOSPITAL COURSE:  [name] is a 76 year old male with a history of  coronary artery disease.  He had an abnormal Cardiolite in June of 2004 and  was cathed.  Brachy therapy was planned but not performed secondary to being  unable to get in touch with the patient.  He came to the office on November 09, 2002 and was admitted to the hospital for further evaluation and treatment.   [name] had a cardiac catheterization on November 09, 2002 for intervention  to his protected left main.  He had PTCA with a cutting balloon to the  protected left main, reducing the stenosis from 80% to 20%.  There was no  recoil.  He had Plavix added to his medication regimen.  He also had Altace  added for better blood pressure control.   The next day he was ambulating without chest pain or shortness of breath.  He had bilateral hematomas but no bruit or ecchymosis in his groin and it  was felt that there were no problems secondary to the cath.  Additionally he  had a potassium of 3.4 and this was supplemented.  A case management consult  was called to help him with his medications.  He will be given a certain  amount of Plavix from the hospital and manufactured forms are being obtained  to further follow up on this.  [name] additionally has claudication  symptoms and will follow up with Dr. Samule Ohm in the [place] office for  this.  [name] was considered stable for discharge on November 10, 2002  with outpatient follow-up arranged.  Blood pressure medications can be  further adjusted at that time.   LABORATORY DATA:  Hemoglobin 12.2, hematocrit 35.5, WBC 6.9, platelets 286.  Sodium 138, potassium 3.4, chloride 107, CO2 27, BUN 8, creatinine 0.7,  glucose 157.  Post procedure CK-MB was negative.   CONDITION ON DISCHARGE:  Improved.   DISCHARGE DIAGNOSES:  1. Anginal pain, status post PTCA with a cutting balloon to protected left     main this admission.  2. Preserved left ventricular function with an ejection fraction estimated     at 50-55% in June of 2004.  3. Status post aorto coronary bypass surgery in October of 2000 with SVG to     distal LAD, SVG to V1, and SVG to OM.  4. Status post PTCA and stent  to the left main in 2003.  5. Severe native three vessel disease.  6. Hypertension.  7. Hyperlipidemia.  8. Peripheral vascular disease.  9. Remote history of peptic ulcer disease.  10.      Ongoing tobacco use.  11.      Noncompliance with medications secondary to financial issues.  12.      Hypokalemia this admission.  13.      Noninsulin dependent diabetes mellitus.   DISCHARGE INSTRUCTIONS:   ACTIVITY:  His activity level is to include no driving, sexual activity, or  heavy exertion for two days.   DIET:  He is to stick to a diabetic low fat diet.   FOLLOW UP:  He is to follow up in the office for problems with the cath  site.  He is to arrange an appointment with Dr. Diona Browner and the office has  been contacted so that he can get a follow-up appointment with Dr. Samule Ohm.   SPECIAL INSTRUCTIONS:  He is not to use tobacco.   DISCHARGE MEDICATIONS:  1. Plavix 75 mg daily.  2. Coated aspirin 325 mg daily.  3. Metoprolol 50 mg b.i.d.  4. Lisinopril 5 mg daily.  5. Imdur 30 mg daily.  6. Nitroglycerin p.r.n.  7. Avandamet 2/500 mg b.i.d., restart November 12, 2002.        Lavella Hammock, P.A. [facility]                  Thomas D. Riley Kill, M.D.    RG/MEDQ  D:   11/10/2002  T:  11/10/2002  Job:  [id number]   cc:   Arturo Morton. Riley Kill, M.D.   Dr. Sherryll Burger, [place], [place]    cc:   Arturo Morton. Riley Kill, M.D.   Dr. Sherryll Burger, [place], [place]

## 2010-09-18 NOTE — Cardiovascular Report (Signed)
[place]. [facility]  Patient:    Darin, Miller Visit Number: [id number] MRN: [id number]          Service Type: MED Location: [phone] Attending Physician:  [name] Dictated by:   [name], M.D. [facility] Proc. Date: 08/04/01 Admit Date:  08/01/2001 Discharge Date: 08/05/2001   CC:         [name], M.D., [facility]  [name], M.D. [facility]   Cardiac Catheterization  INDICATIONS: The patient is a pleasant, 76 year old gentleman, who previously has undergone revascularization surgery. He has had some recent anginal-type chest pain, which has been rather prominent. The patient underwent cardiac catheterization which demonstrated a 95% left main stenosis. The LAD was occluded. There was some mild segmental disease leading into the diagonal system. The vein graft to the circumflex marginal is widely patent. The internal mammary to the second diagonal was patent and the vein graft to the distal LAD was patent. The vein graft to the diagonal was severely diseased, small in caliber and subtotally occluded distally. The native right coronary artery had a 50% mid stenosis and a 70% after the PDA. We talked about the various options in great detail. We talked about the possibility of continued medical therapy. I reviewed the films in detail with Dr. Chales Miller, as well as Dr. Gerri Miller. It was our feeling that options would include dilating and stenting a protected left main. This leads into the diagonal. The likelihood of long-term patency with the first diagonal vein graft intervention seemed less likely. Historically, the patient had a fair amount of chest discomfort and his family was fairly adamant that he had been quite a bit limited and complaining quite a bit about chest discomfort. As a result, he agreed to attempted percutaneous intervention. Risks, benefits and alternatives were discussed with the patient.  Incidentally, the patient was noted to have hyperglycemia and hemoglobin A1c was obtained which was 9.9.  PROCEDURE: Percutaneous coronary stenting of the left main coronary artery.  DESCRIPTION OF PROCEDURE: The patient was brought to the catheterization laboratory and prepped and draped in the usual fashion. Through an anterior puncture, the left femoral artery was easily entered and a 7 French sheath was placed. Heparin was given according to protocol and the ACT titrated up to between 200 and 300. The precise ACT at the beginning of the procedure was 266. Additional heparin was given to achieve this. Integrilin was also given according to protocol. The left main was engaged with a JL4 guiding catheter, which immediately damped. It was pulled back into the aortic root just outside the left main and this area was then crossed with a BMW wire. The lesion was then dilated with a 2.5 Quantum Maverick balloon with a fair amount of improvement in the appearance of the artery. We then carefully sized and placed an 18 mm x 3.0, Guidant stent, Zeta model, and this was taken up to 14 atmospheres. There was marked improvement in the appearance of the artery with near normalization of the vessel with good runoff into the diagonal system. There was also a lot of tortuosity in the LAD and diagonal leading into this area, and multiple views were obtained to ensure that there was adequate runoff for the stent. The stent edges appeared without evidence of edge dissection. I ultimately then brought the patients wife into the catheterization suite and we reviewed the films together. There was marked reduction in the stenosis form 95% to  0% with this successful procedure.  ANGIOGRAPHIC DATA: The left main coronary artery is a protected left main vessel. There is 90-95% segmental disease throughout the left main extending over to the takeoff of the circumflex. The circumflex is known to be grafted. The  LAD is occluded after the diagonal takeoff and there is probably 50% segmental narrowing in the LAD leading into the diagonal. The diagonal branches themselves have a fair amount of irregularity. Following the stenting procedure, the 95% stenosis reduced to 0% with an excellent angiographic appearance.  CONCLUSIONS: Successful percutaneous stenting of the left main coronary artery.  DISPOSITION: The patient will be treated with aspirin and Plavix. He will likely need new diabetes therapy and a diabetic visit was ordered in the hospital. The patient will follow up with Dr. Kirstie Miller for long-term continuing care of his diabetes mellitus. Dictated by:   [name], M.D. [facility] Attending Physician:  [name] DD:  08/04/01 TD:  07/07/01 Job: [id number]

## 2010-11-06 ENCOUNTER — Encounter: Payer: Self-pay | Admitting: Cardiology

## 2011-01-25 LAB — I-STAT 8, (EC8 V) (CONVERTED LAB)
BUN: 7
Chloride: 107
Hemoglobin: 12.2 — ABNORMAL LOW
Operator id: 277751
Potassium: 3.6
pCO2, Ven: 42.5 — ABNORMAL LOW

## 2011-01-25 LAB — POCT CARDIAC MARKERS
CKMB, poc: <1 — ABNORMAL LOW
Myoglobin, poc: 35.6
Operator id: 277751

## 2011-01-25 LAB — COMPREHENSIVE METABOLIC PANEL
ALT: 13
Alkaline Phosphatase: 81
CO2: 26
GFR calc non Af Amer: >60
Glucose, Bld: 238 — ABNORMAL HIGH
Potassium: 3.5
Sodium: 137
Total Protein: 6.5

## 2011-01-25 LAB — CARDIAC PANEL(CRET KIN+CKTOT+MB+TROPI)
Relative Index: INVALID
Total CK: 47
Troponin I: 0.46 — ABNORMAL HIGH
Troponin I: 0.01

## 2011-01-25 LAB — DIFFERENTIAL
Basophils Relative: 1
Eosinophils Absolute: 0.3
Monocytes Relative: 7
Neutrophils Relative %: 59

## 2011-01-25 LAB — CBC
HCT: 35.1 — ABNORMAL LOW
HCT: 33 — ABNORMAL LOW
HCT: 33.2 — ABNORMAL LOW
Hemoglobin: 11.9 — ABNORMAL LOW
Hemoglobin: 11.3 — ABNORMAL LOW
Hemoglobin: 11.4 — ABNORMAL LOW
MCHC: 33.6
MCHC: 33.9
MCHC: 34.3
MCV: 86.7
MCV: 85.6
Platelets: 252
Platelets: 416 — ABNORMAL HIGH
RBC: 3.88 — ABNORMAL LOW
RBC: 3.92 — ABNORMAL LOW
RDW: 14.5
RDW: 13.6
RDW: 14.1
WBC: 9

## 2011-01-25 LAB — LIPID PANEL
Cholesterol: 133
HDL: 18 — ABNORMAL LOW
LDL Cholesterol: 94
Total CHOL/HDL Ratio: 7.4
Triglycerides: 104

## 2011-01-25 LAB — BASIC METABOLIC PANEL
BUN: 5 — ABNORMAL LOW
Calcium: 8.7
Calcium: 8.9
Creatinine, Ser: 0.77
Creatinine, Ser: 0.74
GFR calc Af Amer: >60
GFR calc non Af Amer: >60
GFR calc non Af Amer: >60
Glucose, Bld: 170 — ABNORMAL HIGH
Sodium: 139

## 2011-01-25 LAB — PROTIME-INR: INR: 1.1

## 2011-01-25 LAB — CK TOTAL AND CKMB (NOT AT ARMC): Total CK: 57

## 2011-01-29 LAB — IRON AND TIBC
Iron: 27 — ABNORMAL LOW
Iron: 34 — ABNORMAL LOW
TIBC: 363
TIBC: 373
UIBC: 339

## 2011-01-29 LAB — CBC
HCT: 31.4 — ABNORMAL LOW
HCT: 34.3 — ABNORMAL LOW
Hemoglobin: 10.3 — ABNORMAL LOW
Hemoglobin: 11.1 — ABNORMAL LOW
Hemoglobin: 9.9 — ABNORMAL LOW
MCHC: 32.6
MCHC: 32.7
MCV: 77.9 — ABNORMAL LOW
Platelets: 291
Platelets: 305
RBC: 3.9 — ABNORMAL LOW
RBC: 4.43
RDW: 18.7 — ABNORMAL HIGH
RDW: 18.8 — ABNORMAL HIGH
WBC: 7.8
WBC: 8.1

## 2011-01-29 LAB — LIPID PANEL: Cholesterol: 113

## 2011-01-29 LAB — HEPARIN LEVEL (UNFRACTIONATED)
Heparin Unfractionated: 0.12 — ABNORMAL LOW
Heparin Unfractionated: <0.1 — ABNORMAL LOW

## 2011-01-29 LAB — BASIC METABOLIC PANEL
BUN: 9
CO2: 23
CO2: 23
Calcium: 8.8
Chloride: 107
Chloride: 109
Creatinine, Ser: 0.86
GFR calc Af Amer: >60
GFR calc Af Amer: >60
GFR calc non Af Amer: >60
GFR calc non Af Amer: >60
Glucose, Bld: 101 — ABNORMAL HIGH
Glucose, Bld: 101 — ABNORMAL HIGH
Potassium: 3.8
Potassium: 3.9
Sodium: 138
Sodium: 138

## 2011-01-29 LAB — FERRITIN
Ferritin: 12 — ABNORMAL LOW (ref 22–322)
Ferritin: 13 — ABNORMAL LOW (ref 22–322)

## 2011-01-29 LAB — RETICULOCYTES: Retic Ct Pct: 0.8

## 2012-03-23 DIAGNOSIS — D649 Anemia, unspecified: Secondary | ICD-10-CM

## 2012-04-05 DIAGNOSIS — D509 Iron deficiency anemia, unspecified: Secondary | ICD-10-CM

## 2012-04-12 DIAGNOSIS — D509 Iron deficiency anemia, unspecified: Secondary | ICD-10-CM

## 2012-04-19 DIAGNOSIS — D509 Iron deficiency anemia, unspecified: Secondary | ICD-10-CM

## 2012-06-23 DIAGNOSIS — K5900 Constipation, unspecified: Secondary | ICD-10-CM

## 2012-06-23 DIAGNOSIS — D638 Anemia in other chronic diseases classified elsewhere: Secondary | ICD-10-CM

## 2012-06-23 DIAGNOSIS — D509 Iron deficiency anemia, unspecified: Secondary | ICD-10-CM

## 2012-06-23 DIAGNOSIS — K922 Gastrointestinal hemorrhage, unspecified: Secondary | ICD-10-CM

## 2012-06-27 DIAGNOSIS — D509 Iron deficiency anemia, unspecified: Secondary | ICD-10-CM

## 2012-06-27 DIAGNOSIS — D638 Anemia in other chronic diseases classified elsewhere: Secondary | ICD-10-CM

## 2012-07-13 DIAGNOSIS — D509 Iron deficiency anemia, unspecified: Secondary | ICD-10-CM

## 2012-07-29 ENCOUNTER — Emergency Department (HOSPITAL_COMMUNITY)
Admission: EM | Admit: 2012-07-29 | Discharge: 2012-07-30 | Disposition: A | Discharge status: Home / Self Care | Payer: Medicare Other | Attending: Emergency Medicine | Admitting: Emergency Medicine

## 2012-07-29 ENCOUNTER — Emergency Department (HOSPITAL_COMMUNITY): Payer: Medicare Other

## 2012-07-29 ENCOUNTER — Encounter (HOSPITAL_COMMUNITY): Payer: Self-pay | Admitting: Emergency Medicine

## 2012-07-29 DIAGNOSIS — Z79899 Other long term (current) drug therapy: Secondary | ICD-10-CM | POA: Insufficient documentation

## 2012-07-29 DIAGNOSIS — Z8679 Personal history of other diseases of the circulatory system: Secondary | ICD-10-CM | POA: Insufficient documentation

## 2012-07-29 DIAGNOSIS — R5381 Other malaise: Secondary | ICD-10-CM | POA: Insufficient documentation

## 2012-07-29 DIAGNOSIS — R42 Dizziness and giddiness: Secondary | ICD-10-CM | POA: Insufficient documentation

## 2012-07-29 DIAGNOSIS — I2510 Atherosclerotic heart disease of native coronary artery without angina pectoris: Secondary | ICD-10-CM | POA: Insufficient documentation

## 2012-07-29 DIAGNOSIS — J4489 Other specified chronic obstructive pulmonary disease: Secondary | ICD-10-CM | POA: Insufficient documentation

## 2012-07-29 DIAGNOSIS — I10 Essential (primary) hypertension: Secondary | ICD-10-CM | POA: Insufficient documentation

## 2012-07-29 DIAGNOSIS — J449 Chronic obstructive pulmonary disease, unspecified: Secondary | ICD-10-CM | POA: Insufficient documentation

## 2012-07-29 DIAGNOSIS — E785 Hyperlipidemia, unspecified: Secondary | ICD-10-CM | POA: Insufficient documentation

## 2012-07-29 DIAGNOSIS — Z7982 Long term (current) use of aspirin: Secondary | ICD-10-CM | POA: Insufficient documentation

## 2012-07-29 DIAGNOSIS — E119 Type 2 diabetes mellitus without complications: Secondary | ICD-10-CM | POA: Insufficient documentation

## 2012-07-29 DIAGNOSIS — R5383 Other fatigue: Secondary | ICD-10-CM | POA: Insufficient documentation

## 2012-07-29 DIAGNOSIS — Z8673 Personal history of transient ischemic attack (TIA), and cerebral infarction without residual deficits: Secondary | ICD-10-CM | POA: Insufficient documentation

## 2012-07-29 DIAGNOSIS — F17200 Nicotine dependence, unspecified, uncomplicated: Secondary | ICD-10-CM | POA: Insufficient documentation

## 2012-07-29 LAB — DIFFERENTIAL
Basophils Absolute: 0.1 10*3/uL (ref 0.0–0.1)
Eosinophils Absolute: 1.1 10*3/uL — ABNORMAL HIGH (ref 0.0–0.7)
Eosinophils Relative: 12 % — ABNORMAL HIGH (ref 0–5)
Lymphs Abs: 1.6 10*3/uL (ref 0.7–4.0)
Neutrophils Relative %: 64 % (ref 43–77)

## 2012-07-29 LAB — RAPID URINE DRUG SCREEN, HOSP PERFORMED
Amphetamines: NOT DETECTED
Barbiturates: NOT DETECTED
Benzodiazepines: POSITIVE — AB
Cocaine: NOT DETECTED
Tetrahydrocannabinol: NOT DETECTED

## 2012-07-29 LAB — APTT: aPTT: 38 seconds — ABNORMAL HIGH (ref 24–37)

## 2012-07-29 LAB — URINALYSIS, ROUTINE W REFLEX MICROSCOPIC
Ketones, ur: NEGATIVE mg/dL
Nitrite: NEGATIVE
Protein, ur: NEGATIVE mg/dL
pH: 6 (ref 5.0–8.0)

## 2012-07-29 LAB — CBC
MCH: 28.5 pg (ref 26.0–34.0)
MCV: 84.6 fL (ref 78.0–100.0)
Platelets: 289 10*3/uL (ref 150–400)
RBC: 4.28 MIL/uL (ref 4.22–5.81)
RDW: 15.8 % — ABNORMAL HIGH (ref 11.5–15.5)
WBC: 8.9 10*3/uL (ref 4.0–10.5)

## 2012-07-29 LAB — COMPREHENSIVE METABOLIC PANEL
ALT: 7 U/L (ref 0–53)
AST: 13 U/L (ref 0–37)
Albumin: 3.5 g/dL (ref 3.5–5.2)
Alkaline Phosphatase: 80 U/L (ref 39–117)
Calcium: 9.5 mg/dL (ref 8.4–10.5)
Potassium: 3.5 mEq/L (ref 3.5–5.1)
Sodium: 139 mEq/L (ref 135–145)
Total Protein: 7.8 g/dL (ref 6.0–8.3)

## 2012-07-29 LAB — POCT I-STAT, CHEM 8
BUN: 10 mg/dL (ref 6–23)
Calcium, Ion: 1.23 mmol/L (ref 1.13–1.30)
Chloride: 104 mEq/L (ref 96–112)
Creatinine, Ser: 0.9 mg/dL (ref 0.50–1.35)
Sodium: 143 mEq/L (ref 135–145)
TCO2: 29 mmol/L (ref 0–100)

## 2012-07-29 LAB — POCT I-STAT TROPONIN I: Troponin i, poc: 0.02 ng/mL (ref 0.00–0.08)

## 2012-07-29 MED ORDER — LIDOCAINE HCL (CARDIAC) 20 MG/ML IV SOLN
INTRAVENOUS | Status: AC
  Filled 2012-07-29: qty 5

## 2012-07-29 NOTE — ED Notes (Signed)
No urine output with in and out cath. Tech felt resistance and pt felt pain during insertion.

## 2012-07-29 NOTE — ED Provider Notes (Signed)
History     CSN: [id number]  Arrival date & time 07/29/12  1654   First MD Initiated Contact with Patient 07/29/12 1659      Chief Complaint  Patient presents with  . Dizziness    (Consider location/radiation/quality/duration/timing/severity/associated sxs/prior treatment) HPI Comments: Patient brought to the ER for evaluation of weakness and dizziness. Patient reportedly had onset of feeling dizzy while at home. He reports that he sat down and rested and the symptoms resolved after 10 minutes. There was concern because he did have a recent stroke. Patient did not have any focal neurologic complaints. He is back to his normal baseline. Patient denies headache, blurred vision, chest pain, heart palpitations, shortness of breath. He has not any recent illness.   Past Medical History  Diagnosis Date  . CAD (coronary artery disease)     multi vessel. s/p recent bare metal stenting of high grade proximal R CA stenosis. residual 70% : main in-stent restenosis, with distal filling of the left anterior descending from graft flow. patent bypass graft, except for 100% occlusion of saphernous vein graft - 1st diagonal branch. 3-vessel CABG 2000.  Marland Kitchen CAD (coronary artery disease)     s/p of 95% L main artery stenosis, 4/03: cutting balloon PTCA of 80% in-stent restenosis, 7/04. preserved L ventriculr function  . Carotid bruit     bilateral. no sigificant distal abd atherosclerosis by recent cath.   Marland Kitchen COPD (chronic obstructive pulmonary disease)     ongoing tobacco   . DM2 (diabetes mellitus, type 2)   . Dyslipidemia   . HTN (hypertension)     Past Surgical History  Procedure Laterality Date  . Cardiac catheterization      No family history on file.  History  Substance Use Topics  . Smoking status: Current Every Day Smoker  . Smokeless tobacco: Not on file     Comment: 50 pack year hx   . Alcohol Use: No     Comment: has not had alcohol in 30-40 years       Review of Systems   Neurological: Positive for dizziness.  All other systems reviewed and are negative.    Allergies  Review of patient's allergies indicates no known allergies.  Home Medications   Current Outpatient Rx  Name  Route  Sig  Dispense  Refill  . aspirin 325 MG tablet   Oral   Take 325 mg by mouth daily.           Marland Kitchen lisinopril (PRINIVIL,ZESTRIL) 20 MG tablet   Oral   Take 20 mg by mouth daily.           . pravastatin (PRAVACHOL) 40 MG tablet   Oral   Take 40 mg by mouth daily.             BP 142/55  Pulse 54  Temp(Src) 98.1 F (36.7 C) (Oral)  Resp 15  SpO2 100%  Physical Exam  Constitutional: He is oriented to person, place, and time. He appears well-developed and well-nourished. No distress.  HENT:  Head: Normocephalic and atraumatic.  Right Ear: Hearing normal.  Nose: Nose normal.  Mouth/Throat: Oropharynx is clear and moist and mucous membranes are normal.  Eyes: Conjunctivae and EOM are normal. Pupils are equal, round, and reactive to light.  Neck: Normal range of motion. Neck supple.  Cardiovascular: Normal rate, regular rhythm, S1 normal and S2 normal.  Exam reveals no gallop and no friction rub.   No murmur heard. Pulmonary/Chest: Effort normal and  breath sounds normal. No respiratory distress. He exhibits no tenderness.  Abdominal: Soft. Normal appearance and bowel sounds are normal. There is no hepatosplenomegaly. There is no tenderness. There is no rebound, no guarding, no tenderness at McBurney's point and negative Murphy's sign. No hernia.  Musculoskeletal: Normal range of motion.  Neurological: He is alert and oriented to person, place, and time. He has normal strength. No cranial nerve deficit or sensory deficit. Coordination normal. GCS eye subscore is 4. GCS verbal subscore is 5. GCS motor subscore is 6.  Skin: Skin is warm, dry and intact. No rash noted. No cyanosis.  Psychiatric: He has a normal mood and affect. His speech is normal and behavior is  normal. Thought content normal.    ED Course  Procedures (including critical care time)  EKG:  Date: 07/29/2012  Rate: 55  Rhythm: normal sinus rhythm  QRS Axis: normal  Intervals: normal  ST/T Wave abnormalities: ST elevations anteriorly and ST depressions laterally  Conduction Disutrbances:LVH with repolarization abn  Narrative Interpretation:   Old EKG Reviewed: lateral T wave inversions and ST depressions as well as anterior ST elevations are unchanged from prior EKG    Labs Reviewed  APTT - Abnormal; Notable for the following:    aPTT 38 (*)    All other components within normal limits  CBC - Abnormal; Notable for the following:    Hemoglobin 12.2 (*)    HCT 36.2 (*)    RDW 15.8 (*)    All other components within normal limits  DIFFERENTIAL - Abnormal; Notable for the following:    Eosinophils Relative 12 (*)    Eosinophils Absolute 1.1 (*)    All other components within normal limits  COMPREHENSIVE METABOLIC PANEL - Abnormal; Notable for the following:    Glucose, Bld 138 (*)    Total Bilirubin 0.1 (*)    GFR calc non Af Amer 84 (*)    All other components within normal limits  URINE RAPID DRUG SCREEN (HOSP PERFORMED) - Abnormal; Notable for the following:    Benzodiazepines POSITIVE (*)    All other components within normal limits  URINALYSIS, ROUTINE W REFLEX MICROSCOPIC - Abnormal; Notable for the following:    Bilirubin Urine SMALL (*)    All other components within normal limits  GLUCOSE, CAPILLARY - Abnormal; Notable for the following:    Glucose-Capillary 127 (*)    All other components within normal limits  POCT I-STAT, CHEM 8 - Abnormal; Notable for the following:    Glucose, Bld 141 (*)    Hemoglobin 12.9 (*)    HCT 38.0 (*)    All other components within normal limits  PROTIME-INR  TROPONIN I  POCT I-STAT TROPONIN I   Ct Head Wo Contrast  07/29/2012  *RADIOLOGY REPORT*  Clinical Data: Left facial droop, dizzy spells, history of stroke October  2013 with residual left arm weakness and left facial droop, history coronary disease, COPD, hypertension, diabetes  CT HEAD WITHOUT CONTRAST  Technique:  Contiguous axial images were obtained from the base of the skull through the vertex without contrast.  Comparison: 03/20/2012  Findings: Generalized atrophy. Normal ventricular morphology. No midline shift or mass effect. Small vessel chronic ischemic changes of deep cerebral white matter. Old lacunar infarct left caudate head. Scattered streak artifacts secondary to motion. No intracranial hemorrhage, mass lesion, or evidence of acute infarction identified within limitations as above. Significant atherosclerotic calcification within internal carotid arteries at skull base. No gross acute bony or sinus abnormality.  IMPRESSION: Atrophy with small vessel chronic ischemic changes of deep cerebral white matter. Old lacunar infarct left caudate head. No definite acute intracranial abnormalities.   Original Report Authenticated By: Ulyses Southward, M.D.      Diagnosis: Dizziness    MDM  Patient brought to the ER for evaluation of dizzy spells. Patient reportedly had an episode of feeling very dizzy while showering. Daughter reports that he slumped over to the side and had to be held upright. Patient reports that he did not lose consciousness. He remembers the event happening and it lasted for about 15 minutes. He now says that he is at his normal baseline. Daughter felt that he had some left facial drooping, but he does have a pre-existing left hemiparesis from a stroke. Patient is adamant that he has not had any new weakness, numbness or tingling. Patient also is adamant that he will not be admitted to the hospital. Patient's workup has been unremarkable. He'll be discharged home, continue his current medications and followup with his Dr.        Gilda Crease, MD 07/30/12 [phone]

## 2012-07-29 NOTE — ED Notes (Signed)
Pt transported to CT ?

## 2012-07-29 NOTE — ED Notes (Signed)
Per EMS pt came from home where he has been having "dizzy spells" after his shower today. Pt had stroke in October 2013 and still has residual left arm weakness and left facial droop. No new stroke symptoms today. Pt is AOOx4. CBG 166.

## 2012-07-29 NOTE — ED Notes (Signed)
MD at bedside. 

## 2012-07-29 NOTE — ED Notes (Signed)
Pt returned from radiology.

## 2012-08-21 ENCOUNTER — Encounter: Payer: Medicare Other | Admitting: Internal Medicine

## 2012-08-21 DIAGNOSIS — K909 Intestinal malabsorption, unspecified: Secondary | ICD-10-CM

## 2012-08-21 DIAGNOSIS — D509 Iron deficiency anemia, unspecified: Secondary | ICD-10-CM

## 2012-08-21 DIAGNOSIS — D649 Anemia, unspecified: Secondary | ICD-10-CM

## 2012-08-28 DIAGNOSIS — D509 Iron deficiency anemia, unspecified: Secondary | ICD-10-CM

## 2012-09-05 DIAGNOSIS — D509 Iron deficiency anemia, unspecified: Secondary | ICD-10-CM

## 2012-09-11 DIAGNOSIS — D509 Iron deficiency anemia, unspecified: Secondary | ICD-10-CM

## 2013-04-09 DIAGNOSIS — D509 Iron deficiency anemia, unspecified: Secondary | ICD-10-CM | POA: Insufficient documentation

## 2015-03-05 ENCOUNTER — Emergency Department (HOSPITAL_COMMUNITY): Payer: Medicare Other

## 2015-03-05 ENCOUNTER — Encounter (HOSPITAL_COMMUNITY): Payer: Self-pay | Admitting: Emergency Medicine

## 2015-03-05 ENCOUNTER — Inpatient Hospital Stay (HOSPITAL_COMMUNITY)
Admission: EM | Admit: 2015-03-05 | Discharge: 2015-03-10 | DRG: 872 | Disposition: A | Discharge status: Skilled Nursing Facility | Payer: Medicare Other | Attending: Internal Medicine | Admitting: Internal Medicine

## 2015-03-05 DIAGNOSIS — I161 Hypertensive emergency: Secondary | ICD-10-CM | POA: Diagnosis present

## 2015-03-05 DIAGNOSIS — K559 Vascular disorder of intestine, unspecified: Secondary | ICD-10-CM

## 2015-03-05 DIAGNOSIS — R1310 Dysphagia, unspecified: Secondary | ICD-10-CM

## 2015-03-05 DIAGNOSIS — Z7982 Long term (current) use of aspirin: Secondary | ICD-10-CM

## 2015-03-05 DIAGNOSIS — A419 Sepsis, unspecified organism: Secondary | ICD-10-CM | POA: Diagnosis not present

## 2015-03-05 DIAGNOSIS — R9431 Abnormal electrocardiogram [ECG] [EKG]: Secondary | ICD-10-CM

## 2015-03-05 DIAGNOSIS — H5442 Blindness, left eye, normal vision right eye: Secondary | ICD-10-CM | POA: Diagnosis present

## 2015-03-05 DIAGNOSIS — J449 Chronic obstructive pulmonary disease, unspecified: Secondary | ICD-10-CM | POA: Diagnosis present

## 2015-03-05 DIAGNOSIS — I739 Peripheral vascular disease, unspecified: Secondary | ICD-10-CM | POA: Diagnosis present

## 2015-03-05 DIAGNOSIS — F419 Anxiety disorder, unspecified: Secondary | ICD-10-CM | POA: Diagnosis present

## 2015-03-05 DIAGNOSIS — E860 Dehydration: Secondary | ICD-10-CM | POA: Diagnosis present

## 2015-03-05 DIAGNOSIS — F17200 Nicotine dependence, unspecified, uncomplicated: Secondary | ICD-10-CM | POA: Diagnosis present

## 2015-03-05 DIAGNOSIS — R109 Unspecified abdominal pain: Secondary | ICD-10-CM | POA: Diagnosis not present

## 2015-03-05 DIAGNOSIS — H409 Unspecified glaucoma: Secondary | ICD-10-CM | POA: Diagnosis present

## 2015-03-05 DIAGNOSIS — E119 Type 2 diabetes mellitus without complications: Secondary | ICD-10-CM

## 2015-03-05 DIAGNOSIS — Z23 Encounter for immunization: Secondary | ICD-10-CM

## 2015-03-05 DIAGNOSIS — R4781 Slurred speech: Secondary | ICD-10-CM | POA: Diagnosis present

## 2015-03-05 DIAGNOSIS — T82855A Stenosis of coronary artery stent, initial encounter: Secondary | ICD-10-CM | POA: Diagnosis present

## 2015-03-05 DIAGNOSIS — R7989 Other specified abnormal findings of blood chemistry: Secondary | ICD-10-CM | POA: Diagnosis present

## 2015-03-05 DIAGNOSIS — K5900 Constipation, unspecified: Secondary | ICD-10-CM | POA: Diagnosis present

## 2015-03-05 DIAGNOSIS — I2510 Atherosclerotic heart disease of native coronary artery without angina pectoris: Secondary | ICD-10-CM | POA: Diagnosis present

## 2015-03-05 DIAGNOSIS — E785 Hyperlipidemia, unspecified: Secondary | ICD-10-CM | POA: Diagnosis present

## 2015-03-05 DIAGNOSIS — E872 Acidosis, unspecified: Secondary | ICD-10-CM

## 2015-03-05 DIAGNOSIS — E1151 Type 2 diabetes mellitus with diabetic peripheral angiopathy without gangrene: Secondary | ICD-10-CM | POA: Diagnosis present

## 2015-03-05 DIAGNOSIS — R778 Other specified abnormalities of plasma proteins: Secondary | ICD-10-CM | POA: Diagnosis present

## 2015-03-05 DIAGNOSIS — I69354 Hemiplegia and hemiparesis following cerebral infarction affecting left non-dominant side: Secondary | ICD-10-CM

## 2015-03-05 DIAGNOSIS — N179 Acute kidney failure, unspecified: Secondary | ICD-10-CM | POA: Diagnosis present

## 2015-03-05 DIAGNOSIS — Y831 Surgical operation with implant of artificial internal device as the cause of abnormal reaction of the patient, or of later complication, without mention of misadventure at the time of the procedure: Secondary | ICD-10-CM | POA: Diagnosis present

## 2015-03-05 DIAGNOSIS — R29810 Facial weakness: Secondary | ICD-10-CM

## 2015-03-05 DIAGNOSIS — I10 Essential (primary) hypertension: Secondary | ICD-10-CM | POA: Diagnosis present

## 2015-03-05 LAB — LIPASE, BLOOD: LIPASE: 26 U/L (ref 11–51)

## 2015-03-05 LAB — URINALYSIS, ROUTINE W REFLEX MICROSCOPIC
Bilirubin Urine: NEGATIVE
GLUCOSE, UA: NEGATIVE mg/dL
HGB URINE DIPSTICK: NEGATIVE
KETONES UR: NEGATIVE mg/dL
Leukocytes, UA: NEGATIVE
Nitrite: NEGATIVE
PH: 7 (ref 5.0–8.0)
PROTEIN: 100 mg/dL — AB
Specific Gravity, Urine: 1.01 (ref 1.005–1.030)
Urobilinogen, UA: 1 mg/dL (ref 0.0–1.0)

## 2015-03-05 LAB — CBC WITH DIFFERENTIAL/PLATELET
Basophils Absolute: 0 10*3/uL (ref 0.0–0.1)
Basophils Relative: 0 %
EOS PCT: 0 %
Eosinophils Absolute: 0 10*3/uL (ref 0.0–0.7)
HEMATOCRIT: 45.4 % (ref 39.0–52.0)
HEMOGLOBIN: 15.4 g/dL (ref 13.0–17.0)
LYMPHS ABS: 0.9 10*3/uL (ref 0.7–4.0)
LYMPHS PCT: 7 %
MCH: 28.8 pg (ref 26.0–34.0)
MCHC: 33.9 g/dL (ref 30.0–36.0)
MCV: 84.9 fL (ref 78.0–100.0)
Monocytes Absolute: 0.5 10*3/uL (ref 0.1–1.0)
Monocytes Relative: 4 %
NEUTROS ABS: 11.1 10*3/uL — AB (ref 1.7–7.7)
NEUTROS PCT: 89 %
Platelets: 258 10*3/uL (ref 150–400)
RBC: 5.35 MIL/uL (ref 4.22–5.81)
RDW: 13.3 % (ref 11.5–15.5)
WBC: 12.5 10*3/uL — AB (ref 4.0–10.5)

## 2015-03-05 LAB — COMPREHENSIVE METABOLIC PANEL
ALK PHOS: 94 U/L (ref 38–126)
ALT: 21 U/L (ref 17–63)
AST: 41 U/L (ref 15–41)
Albumin: 4 g/dL (ref 3.5–5.0)
Anion gap: 15 (ref 5–15)
BILIRUBIN TOTAL: 0.7 mg/dL (ref 0.3–1.2)
BUN: 13 mg/dL (ref 6–20)
CALCIUM: 9.8 mg/dL (ref 8.9–10.3)
CO2: 20 mmol/L — ABNORMAL LOW (ref 22–32)
CREATININE: 1.38 mg/dL — AB (ref 0.61–1.24)
Chloride: 103 mmol/L (ref 101–111)
GFR, EST AFRICAN AMERICAN: 55 mL/min — AB (ref >60)
GFR, EST NON AFRICAN AMERICAN: 47 mL/min — AB (ref >60)
Glucose, Bld: 191 mg/dL — ABNORMAL HIGH (ref 65–99)
Potassium: 4.2 mmol/L (ref 3.5–5.1)
Sodium: 138 mmol/L (ref 135–145)
Total Protein: 8.6 g/dL — ABNORMAL HIGH (ref 6.5–8.1)

## 2015-03-05 LAB — PROTIME-INR
INR: 1.1 (ref 0.00–1.49)
PROTHROMBIN TIME: 14.4 s (ref 11.6–15.2)

## 2015-03-05 LAB — APTT: APTT: 33 s (ref 24–37)

## 2015-03-05 LAB — URINE MICROSCOPIC-ADD ON

## 2015-03-05 LAB — I-STAT TROPONIN, ED: TROPONIN I, POC: 0.97 ng/mL — AB (ref 0.00–0.08)

## 2015-03-05 LAB — I-STAT CG4 LACTIC ACID, ED
LACTIC ACID, VENOUS: 2.14 mmol/L — AB (ref 0.5–2.0)
Lactic Acid, Venous: 1.44 mmol/L (ref 0.5–2.0)

## 2015-03-05 MED ORDER — MORPHINE SULFATE (PF) 2 MG/ML IV SOLN
2.0000 mg | Freq: Once | INTRAVENOUS | Status: AC
  Administered 2015-03-05: 2 mg via INTRAVENOUS
  Filled 2015-03-05: qty 1

## 2015-03-05 MED ORDER — SODIUM CHLORIDE 0.9 % IV SOLN
1000.0000 mL | INTRAVENOUS | Status: DC
  Administered 2015-03-05: 1000 mL via INTRAVENOUS

## 2015-03-05 MED ORDER — FLEET ENEMA 7-19 GM/118ML RE ENEM
1.0000 | ENEMA | Freq: Once | RECTAL | Status: AC
  Administered 2015-03-05: 1 via RECTAL
  Filled 2015-03-05: qty 1

## 2015-03-05 MED ORDER — ONDANSETRON HCL 4 MG/2ML IJ SOLN
4.0000 mg | Freq: Once | INTRAMUSCULAR | Status: AC
  Administered 2015-03-05: 4 mg via INTRAVENOUS
  Filled 2015-03-05: qty 2

## 2015-03-05 MED ORDER — IOHEXOL 300 MG/ML  SOLN
25.0000 mL | Freq: Once | INTRAMUSCULAR | Status: AC | PRN
  Administered 2015-03-05: 25 mL via ORAL

## 2015-03-05 NOTE — ED Provider Notes (Signed)
CSN: [id number]     Arrival date & time 03/05/15  1339 History   First MD Initiated Contact with Patient 03/05/15 1343     Chief Complaint  Patient presents with  . Abdominal Pain    Patient is a 80 y.o. male presenting with abdominal pain.  Abdominal Pain  Pt complains of soreness in his lower abdomen.  The symptoms are mild to moderate.  He thinks maybe the last few days to a week.  He does not feel like he has had a good bowel movement for a few days.  No diarrhea, no fever, no vomiting.  Still eating and drinking well.    Pt also feels like he is weaker than usual.  His facial droop may be worse.  He wonders if he had another stroke.  Normally he can walk without trouble since his prior stroke. Past Medical History  Diagnosis Date  . CAD (coronary artery disease)     multi vessel. s/p recent bare metal stenting of high grade proximal R CA stenosis. residual 70% : main in-stent restenosis, with distal filling of the left anterior descending from graft flow. patent bypass graft, except for 100% occlusion of saphernous vein graft - 1st diagonal branch. 3-vessel CABG 2000.  Marland Kitchen CAD (coronary artery disease)     s/p of 95% L main artery stenosis, 4/03: cutting balloon PTCA of 80% in-stent restenosis, 7/04. preserved L ventriculr function  . Carotid bruit     bilateral. no sigificant distal abd atherosclerosis by recent cath.   Marland Kitchen COPD (chronic obstructive pulmonary disease) (HCC)     ongoing tobacco   . DM2 (diabetes mellitus, type 2) ([place])   . Dyslipidemia   . HTN (hypertension)    Past Surgical History  Procedure Laterality Date  . Cardiac catheterization     History reviewed. No pertinent family history. Social History  Substance Use Topics  . Smoking status: Former Smoker    Quit date: 08/03/2014  . Smokeless tobacco: None     Comment: 50 pack year hx   . Alcohol Use: No     Comment: has not had alcohol in 30-40 years     Review of Systems  Gastrointestinal: Positive for  abdominal pain.  All other systems reviewed and are negative.     Allergies  Review of patient's allergies indicates no known allergies.  Home Medications   Prior to Admission medications   Medication Sig Start Date End Date Taking? Authorizing Provider  ALPRAZolam (XANAX) 0.25 MG tablet Take 0.25 mg by mouth 3 (three) times daily.    Historical Provider, MD  amLODipine (NORVASC) 5 MG tablet Take 5 mg by mouth daily.    Historical Provider, MD  aspirin 325 MG tablet Take 325 mg by mouth daily.      Historical Provider, MD  Calcium Carbonate-Vitamin D (OS-CAL 500 + D PO) Take 1 tablet by mouth daily as needed.    Historical Provider, MD  clopidogrel (PLAVIX) 75 MG tablet Take 75 mg by mouth daily.    Historical Provider, MD  docusate sodium (COLACE) 100 MG capsule Take 100 mg by mouth 2 (two) times daily.    Historical Provider, MD  lactulose (CHRONULAC) 10 GM/15ML solution Take 30 mLs by mouth 2 (two) times daily as needed.    Historical Provider, MD  lisinopril (PRINIVIL,ZESTRIL) 20 MG tablet Take 20 mg by mouth daily.      Historical Provider, MD  metFORMIN (GLUCOPHAGE) 500 MG tablet Take 500 mg by mouth 2 (  two) times daily with a meal.    Historical Provider, MD  metoprolol (LOPRESSOR) 50 MG tablet Take 50 mg by mouth 2 (two) times daily.    Historical Provider, MD  Multiple Vitamins-Minerals (MULTIVITAMIN PO) Take 1 tablet by mouth daily as needed.    Historical Provider, MD  polyethylene glycol (MIRALAX / GLYCOLAX) packet Take 17 g by mouth daily. In 8 ounces of water    Historical Provider, MD  Potassium Chloride ER 20 MEQ TBCR Take 1 tablet by mouth daily.    Historical Provider, MD  pravastatin (PRAVACHOL) 40 MG tablet Take 40 mg by mouth daily.     Historical Provider, MD   BP 125/64 mmHg  Pulse 72  Temp(Src) 97.4 F (36.3 C) (Oral)  Resp 18  Ht '5\' 9"'$  (1.753 m)  Wt 160 lb (72.576 kg)  BMI 23.62 kg/m2  SpO2 93% Physical Exam  Constitutional: He appears well-developed and  well-nourished. No distress.  HENT:  Head: Normocephalic and atraumatic.  Right Ear: External ear normal.  Left Ear: External ear normal.  Eyes: Conjunctivae are normal. Right eye exhibits no discharge. Left eye exhibits no discharge. No scleral icterus.  Left eye with cataracts  Neck: Neck supple. No tracheal deviation present.  Cardiovascular: Normal rate, regular rhythm and intact distal pulses.   Pulmonary/Chest: Effort normal and breath sounds normal. No stridor. No respiratory distress. He has no wheezes. He has no rales.  Abdominal: Soft. Bowel sounds are normal. He exhibits no distension. There is generalized tenderness. There is no rigidity, no rebound and no guarding.  Musculoskeletal: He exhibits no edema or tenderness.  Neurological: He is alert. No cranial nerve deficit (left facial droop, extraocular movements intact, slurred speech, absent vision left eye) or sensory deficit. He exhibits normal muscle tone. He displays no seizure activity. Coordination normal.  Left hand grip weaker than right, able to lift both off the bed, able to lift both legs off the bed  Skin: Skin is warm and dry. No rash noted.  Psychiatric: He has a normal mood and affect.  Nursing note and vitals reviewed.   ED Course  Procedures (including critical care time) Labs Review Labs Reviewed  CBC WITH DIFFERENTIAL/PLATELET - Abnormal; Notable for the following:    WBC 12.5 (*)    Neutro Abs 11.1 (*)    All other components within normal limits  COMPREHENSIVE METABOLIC PANEL - Abnormal; Notable for the following:    CO2 20 (*)    Glucose, Bld 191 (*)    Creatinine, Ser 1.38 (*)    Total Protein 8.6 (*)    GFR calc non Af Amer 47 (*)    GFR calc Af Amer 55 (*)    All other components within normal limits  LIPASE, BLOOD  PROTIME-INR  APTT  URINALYSIS, ROUTINE W REFLEX MICROSCOPIC (NOT AT [facility])    Imaging Review Ct Head Wo Contrast  03/05/2015  CLINICAL DATA:  80 year old male with history of  left-sided weakness. Abdominal pain. EXAM: CT HEAD WITHOUT CONTRAST TECHNIQUE: Contiguous axial images were obtained from the base of the skull through the vertex without intravenous contrast. COMPARISON:  Head CT 11/08/2013. FINDINGS: Mild cerebral atrophy. Patchy and confluent areas of decreased attenuation are noted throughout the deep and periventricular white matter of the cerebral hemispheres bilaterally, compatible with chronic microvascular ischemic disease. Large old lacunar infarct extending from the head of the left caudate nucleus posteriorly into the adjacent white matter tracts. There is also encephalomalacia from remote high right parietal  infarct and posterior right occipital infarct, which are both unchanged. Left globe is atrophic and slightly sclerotic, similar to prior examinations. No acute intracranial abnormalities. Specifically, no evidence of acute intracranial hemorrhage, no definite findings of acute/subacute cerebral ischemia, no mass, mass effect, hydrocephalus or abnormal intra or extra-axial fluid collections. Visualized paranasal sinuses and mastoids are well pneumatized. No acute displaced skull fractures are identified. IMPRESSION: 1. No acute intracranial abnormalities. 2. Mild cerebral atrophy with extensive chronic ischemic changes redemonstrated, as above. Electronically Signed   By: Vinnie Langton M.D.   On: 03/05/2015 15:10   I have personally reviewed and evaluated these images and lab results as part of my medical decision-making.   EKG Interpretation   Date/Time:  Wednesday March 05 2015 14:30:17 EDT Ventricular Rate:  82 PR Interval:  147 QRS Duration: 154 QT Interval:  443 QTC Calculation: 517 R Axis:   98 Text Interpretation:  Sinus rhythm Probable left atrial enlargement  Consider left ventricular hypertrophy Repol abnrm, global ischemia,  diffuse leads Tall T, consider metabolic/ischemic abnrm Prolonged QT  interval Confirmed by Alvino Chapel  MD,  Ovid Curd [phone]) on 03/05/2015 2:34:48  PM      MDM   Final diagnoses:  None   Leukocytosis noted on initial labs.  UA pending.  If no UTI will order CT abdomen to evaluate further.  Ua does not suggest UTI.  With his abdominal pain, will ct abdomen to evaluate further   Dorie Rank, MD 03/05/15 1644

## 2015-03-05 NOTE — ED Notes (Signed)
Pt here from home via EMS with c/o abd pain since this AM and increased weakness. Pt had previous stroke with left facial droop and left weakness. Pt family requesting case management/social work. Pt sts pain 5/10.to lower quadrants, worse with palpation. Denies n/v/d.

## 2015-03-05 NOTE — ED Notes (Signed)
EKG completed given to EDP.  

## 2015-03-05 NOTE — Care Management (Addendum)
03/05/2015 23:45 W.Daril Warga RN [facility] Hospitalist consulted for elevated troponin and EKG changes, Impending admission. CM will continue to follow for discharge planning.    03/05/2015 20:48 W. Stann Mainland RN BSN NCM ED CM received  consult from [place], concerning disposition plan recommendation for [facility] services, she reported daughter at bedside. CM reviewed patient's record, no PCP listed [facility] medicare listed. Patient presented to ED for Abdominal Pain. CM attempted to meet with patient at bedside, Bedside nurse stated daughter left room and will return shortly. ED evaluation still pending.   Daughter Darlen Round [phone] Returned CM met with her at bedside. Discussed the recommendation for possible HH services, once ED eval is completed if there are  no acute findings, daughter is agreeable. Goal of care  is to have assistance at home to help care for patient, as per daughter. Goal is to keep him at home if possible. CM advise daughter to contact DSS to inquire about Medicaid and CAPS services. Daughter verbalized understanding teach back done.

## 2015-03-06 ENCOUNTER — Encounter (HOSPITAL_COMMUNITY): Payer: Self-pay | Admitting: Internal Medicine

## 2015-03-06 ENCOUNTER — Inpatient Hospital Stay (HOSPITAL_COMMUNITY): Payer: Medicare Other

## 2015-03-06 DIAGNOSIS — F419 Anxiety disorder, unspecified: Secondary | ICD-10-CM | POA: Diagnosis not present

## 2015-03-06 DIAGNOSIS — K5900 Constipation, unspecified: Secondary | ICD-10-CM

## 2015-03-06 DIAGNOSIS — I69354 Hemiplegia and hemiparesis following cerebral infarction affecting left non-dominant side: Secondary | ICD-10-CM | POA: Diagnosis not present

## 2015-03-06 DIAGNOSIS — I10 Essential (primary) hypertension: Secondary | ICD-10-CM | POA: Diagnosis present

## 2015-03-06 DIAGNOSIS — Z23 Encounter for immunization: Secondary | ICD-10-CM | POA: Diagnosis not present

## 2015-03-06 DIAGNOSIS — H5442 Blindness, left eye, normal vision right eye: Secondary | ICD-10-CM | POA: Diagnosis not present

## 2015-03-06 DIAGNOSIS — E119 Type 2 diabetes mellitus without complications: Secondary | ICD-10-CM

## 2015-03-06 DIAGNOSIS — R1030 Lower abdominal pain, unspecified: Secondary | ICD-10-CM | POA: Diagnosis not present

## 2015-03-06 DIAGNOSIS — R4781 Slurred speech: Secondary | ICD-10-CM | POA: Diagnosis not present

## 2015-03-06 DIAGNOSIS — I161 Hypertensive emergency: Secondary | ICD-10-CM | POA: Diagnosis not present

## 2015-03-06 DIAGNOSIS — E860 Dehydration: Secondary | ICD-10-CM | POA: Diagnosis not present

## 2015-03-06 DIAGNOSIS — R109 Unspecified abdominal pain: Secondary | ICD-10-CM | POA: Diagnosis present

## 2015-03-06 DIAGNOSIS — R7989 Other specified abnormal findings of blood chemistry: Secondary | ICD-10-CM

## 2015-03-06 DIAGNOSIS — A419 Sepsis, unspecified organism: Secondary | ICD-10-CM | POA: Diagnosis present

## 2015-03-06 DIAGNOSIS — Y831 Surgical operation with implant of artificial internal device as the cause of abnormal reaction of the patient, or of later complication, without mention of misadventure at the time of the procedure: Secondary | ICD-10-CM | POA: Diagnosis not present

## 2015-03-06 DIAGNOSIS — F17200 Nicotine dependence, unspecified, uncomplicated: Secondary | ICD-10-CM

## 2015-03-06 DIAGNOSIS — E1151 Type 2 diabetes mellitus with diabetic peripheral angiopathy without gangrene: Secondary | ICD-10-CM | POA: Diagnosis present

## 2015-03-06 DIAGNOSIS — R9431 Abnormal electrocardiogram [ECG] [EKG]: Secondary | ICD-10-CM | POA: Diagnosis not present

## 2015-03-06 DIAGNOSIS — R778 Other specified abnormalities of plasma proteins: Secondary | ICD-10-CM | POA: Diagnosis present

## 2015-03-06 DIAGNOSIS — H409 Unspecified glaucoma: Secondary | ICD-10-CM | POA: Diagnosis not present

## 2015-03-06 DIAGNOSIS — N179 Acute kidney failure, unspecified: Secondary | ICD-10-CM | POA: Diagnosis present

## 2015-03-06 DIAGNOSIS — T82855A Stenosis of coronary artery stent, initial encounter: Secondary | ICD-10-CM | POA: Diagnosis not present

## 2015-03-06 DIAGNOSIS — I2510 Atherosclerotic heart disease of native coronary artery without angina pectoris: Secondary | ICD-10-CM | POA: Diagnosis not present

## 2015-03-06 DIAGNOSIS — R1310 Dysphagia, unspecified: Secondary | ICD-10-CM | POA: Diagnosis not present

## 2015-03-06 DIAGNOSIS — R29810 Facial weakness: Secondary | ICD-10-CM | POA: Diagnosis not present

## 2015-03-06 DIAGNOSIS — E785 Hyperlipidemia, unspecified: Secondary | ICD-10-CM | POA: Diagnosis not present

## 2015-03-06 DIAGNOSIS — Z7982 Long term (current) use of aspirin: Secondary | ICD-10-CM | POA: Diagnosis not present

## 2015-03-06 DIAGNOSIS — J449 Chronic obstructive pulmonary disease, unspecified: Secondary | ICD-10-CM | POA: Diagnosis not present

## 2015-03-06 DIAGNOSIS — K559 Vascular disorder of intestine, unspecified: Secondary | ICD-10-CM | POA: Diagnosis present

## 2015-03-06 LAB — CBC
HEMATOCRIT: 42.4 % (ref 39.0–52.0)
HEMOGLOBIN: 14.6 g/dL (ref 13.0–17.0)
MCH: 29.6 pg (ref 26.0–34.0)
MCHC: 34.4 g/dL (ref 30.0–36.0)
MCV: 86 fL (ref 78.0–100.0)
PLATELETS: 241 10*3/uL (ref 150–400)
RBC: 4.93 MIL/uL (ref 4.22–5.81)
RDW: 13.4 % (ref 11.5–15.5)
WBC: 10.8 10*3/uL — ABNORMAL HIGH (ref 4.0–10.5)

## 2015-03-06 LAB — BASIC METABOLIC PANEL
ANION GAP: 10 (ref 5–15)
BUN: 12 mg/dL (ref 6–20)
CALCIUM: 9.2 mg/dL (ref 8.9–10.3)
CO2: 26 mmol/L (ref 22–32)
Chloride: 105 mmol/L (ref 101–111)
Creatinine, Ser: 1 mg/dL (ref 0.61–1.24)
GLUCOSE: 125 mg/dL — AB (ref 65–99)
POTASSIUM: 4 mmol/L (ref 3.5–5.1)
Sodium: 141 mmol/L (ref 135–145)

## 2015-03-06 LAB — SODIUM, URINE, RANDOM: SODIUM UR: 166 mmol/L

## 2015-03-06 LAB — GLUCOSE, CAPILLARY
GLUCOSE-CAPILLARY: 91 mg/dL (ref 65–99)
GLUCOSE-CAPILLARY: 89 mg/dL (ref 65–99)
Glucose-Capillary: 76 mg/dL (ref 65–99)
Glucose-Capillary: 128 mg/dL — ABNORMAL HIGH (ref 65–99)

## 2015-03-06 LAB — CREATININE, URINE, RANDOM: CREATININE, URINE: 42.51 mg/dL

## 2015-03-06 LAB — LIPID PANEL
Cholesterol: 131 mg/dL (ref 0–200)
HDL: 43 mg/dL (ref >40)
LDL CALC: 81 mg/dL (ref 0–99)
Total CHOL/HDL Ratio: 3 RATIO
Triglycerides: 37 mg/dL (ref <150)
VLDL: 7 mg/dL (ref 0–40)

## 2015-03-06 LAB — LACTIC ACID, PLASMA: Lactic Acid, Venous: 2 mmol/L (ref 0.5–2.0)

## 2015-03-06 LAB — TROPONIN I
TROPONIN I: 0.9 ng/mL — AB (ref <0.031)
TROPONIN I: 0.64 ng/mL — AB (ref <0.031)
Troponin I: 0.64 ng/mL (ref <0.031)

## 2015-03-06 LAB — MRSA PCR SCREENING: MRSA by PCR: NEGATIVE

## 2015-03-06 MED ORDER — INSULIN ASPART 100 UNIT/ML ~~LOC~~ SOLN
0.0000 [IU] | Freq: Three times a day (TID) | SUBCUTANEOUS | Status: DC
  Administered 2015-03-08: 2 [IU] via SUBCUTANEOUS
  Administered 2015-03-08 – 2015-03-10 (×6): 1 [IU] via SUBCUTANEOUS
  Administered 2015-03-10: 2 [IU] via SUBCUTANEOUS

## 2015-03-06 MED ORDER — LABETALOL HCL 5 MG/ML IV SOLN: 10.0000 mg | INTRAVENOUS | Status: DC | PRN

## 2015-03-06 MED ORDER — POLYETHYLENE GLYCOL 3350 17 G PO PACK
17.0000 g | PACK | Freq: Two times a day (BID) | ORAL | Status: DC
  Administered 2015-03-06 – 2015-03-10 (×10): 17 g via ORAL
  Filled 2015-03-06 (×10): qty 1

## 2015-03-06 MED ORDER — HEPARIN SODIUM (PORCINE) 5000 UNIT/ML IJ SOLN
5000.0000 [IU] | Freq: Three times a day (TID) | INTRAMUSCULAR | Status: DC
  Administered 2015-03-06 – 2015-03-10 (×14): 5000 [IU] via SUBCUTANEOUS
  Filled 2015-03-06 (×12): qty 1

## 2015-03-06 MED ORDER — HYDRALAZINE HCL 10 MG PO TABS
10.0000 mg | ORAL_TABLET | Freq: Three times a day (TID) | ORAL | Status: DC
  Administered 2015-03-06 – 2015-03-07 (×2): 10 mg via ORAL
  Filled 2015-03-06 (×2): qty 1

## 2015-03-06 MED ORDER — METRONIDAZOLE IN NACL 5-0.79 MG/ML-% IV SOLN
500.0000 mg | Freq: Three times a day (TID) | INTRAVENOUS | Status: DC
  Administered 2015-03-06 – 2015-03-10 (×15): 500 mg via INTRAVENOUS
  Filled 2015-03-06 (×15): qty 100

## 2015-03-06 MED ORDER — SODIUM CHLORIDE 0.9 % IV BOLUS (SEPSIS): 1500.0000 mL | Freq: Once | INTRAVENOUS | Status: DC

## 2015-03-06 MED ORDER — LACTULOSE 10 GM/15ML PO SOLN: 20.0000 g | Freq: Two times a day (BID) | ORAL | Status: DC | PRN

## 2015-03-06 MED ORDER — PRAVASTATIN SODIUM 40 MG PO TABS: 40.0000 mg | ORAL_TABLET | Freq: Every day | ORAL | Status: DC

## 2015-03-06 MED ORDER — MAGNESIUM HYDROXIDE 400 MG/5ML PO SUSP
30.0000 mL | Freq: Once | ORAL | Status: AC
  Administered 2015-03-06: 30 mL via ORAL
  Filled 2015-03-06: qty 30

## 2015-03-06 MED ORDER — SODIUM CHLORIDE 0.9 % IV SOLN
1000.0000 mL | INTRAVENOUS | Status: DC
  Administered 2015-03-06 – 2015-03-07 (×2): 1000 mL via INTRAVENOUS

## 2015-03-06 MED ORDER — DOCUSATE SODIUM 100 MG PO CAPS
100.0000 mg | ORAL_CAPSULE | Freq: Two times a day (BID) | ORAL | Status: DC
  Administered 2015-03-06 – 2015-03-10 (×10): 100 mg via ORAL
  Filled 2015-03-06 (×11): qty 1

## 2015-03-06 MED ORDER — METOPROLOL TARTRATE 50 MG PO TABS
50.0000 mg | ORAL_TABLET | Freq: Two times a day (BID) | ORAL | Status: DC
  Administered 2015-03-06 – 2015-03-07 (×5): 50 mg via ORAL
  Filled 2015-03-06: qty 1
  Filled 2015-03-06: qty 2
  Filled 2015-03-06 (×3): qty 1

## 2015-03-06 MED ORDER — SODIUM CHLORIDE 0.9 % IV BOLUS (SEPSIS)
500.0000 mL | Freq: Once | INTRAVENOUS | Status: AC
  Administered 2015-03-06: 500 mL via INTRAVENOUS

## 2015-03-06 MED ORDER — NITROGLYCERIN 0.2 MG/HR TD PT24
0.2000 mg | MEDICATED_PATCH | Freq: Every day | TRANSDERMAL | Status: DC
  Administered 2015-03-06: 0.2 mg via TRANSDERMAL
  Filled 2015-03-06: qty 1

## 2015-03-06 MED ORDER — INFLUENZA VAC SPLIT QUAD 0.5 ML IM SUSY
0.5000 mL | PREFILLED_SYRINGE | INTRAMUSCULAR | Status: AC
  Administered 2015-03-07: 0.5 mL via INTRAMUSCULAR
  Filled 2015-03-06: qty 0.5

## 2015-03-06 MED ORDER — CLOPIDOGREL BISULFATE 75 MG PO TABS
75.0000 mg | ORAL_TABLET | Freq: Every day | ORAL | Status: DC
  Administered 2015-03-06 – 2015-03-10 (×5): 75 mg via ORAL
  Filled 2015-03-06 (×5): qty 1

## 2015-03-06 MED ORDER — AMLODIPINE BESYLATE 5 MG PO TABS
5.0000 mg | ORAL_TABLET | Freq: Every day | ORAL | Status: DC
  Administered 2015-03-06 – 2015-03-07 (×2): 5 mg via ORAL
  Filled 2015-03-06 (×3): qty 1

## 2015-03-06 MED ORDER — NICOTINE 21 MG/24HR TD PT24
21.0000 mg | MEDICATED_PATCH | Freq: Every day | TRANSDERMAL | Status: DC
  Administered 2015-03-06 – 2015-03-10 (×5): 21 mg via TRANSDERMAL
  Filled 2015-03-06 (×5): qty 1

## 2015-03-06 MED ORDER — HYDRALAZINE HCL 20 MG/ML IJ SOLN
10.0000 mg | INTRAMUSCULAR | Status: DC | PRN
  Administered 2015-03-08: 10 mg via INTRAVENOUS
  Filled 2015-03-06: qty 1

## 2015-03-06 MED ORDER — ACETAMINOPHEN 325 MG PO TABS
650.0000 mg | ORAL_TABLET | Freq: Four times a day (QID) | ORAL | Status: DC | PRN
  Administered 2015-03-08: 650 mg via ORAL
  Filled 2015-03-06: qty 2

## 2015-03-06 MED ORDER — SODIUM CHLORIDE 0.9 % IJ SOLN
3.0000 mL | Freq: Two times a day (BID) | INTRAMUSCULAR | Status: DC
  Administered 2015-03-06 – 2015-03-08 (×4): 3 mL via INTRAVENOUS

## 2015-03-06 MED ORDER — LISINOPRIL 10 MG PO TABS
20.0000 mg | ORAL_TABLET | Freq: Every day | ORAL | Status: DC
  Administered 2015-03-06 – 2015-03-07 (×2): 20 mg via ORAL
  Filled 2015-03-06 (×2): qty 1

## 2015-03-06 MED ORDER — CIPROFLOXACIN IN D5W 400 MG/200ML IV SOLN
400.0000 mg | Freq: Two times a day (BID) | INTRAVENOUS | Status: DC
  Administered 2015-03-06 – 2015-03-10 (×10): 400 mg via INTRAVENOUS
  Filled 2015-03-06 (×11): qty 200

## 2015-03-06 MED ORDER — HEPARIN SODIUM (PORCINE) 5000 UNIT/ML IJ SOLN
5000.0000 [IU] | Freq: Three times a day (TID) | INTRAMUSCULAR | Status: DC
  Filled 2015-03-06: qty 1

## 2015-03-06 MED ORDER — ASPIRIN 325 MG PO TABS
325.0000 mg | ORAL_TABLET | Freq: Every day | ORAL | Status: DC
  Administered 2015-03-06 – 2015-03-10 (×5): 325 mg via ORAL
  Filled 2015-03-06 (×6): qty 1

## 2015-03-06 MED ORDER — PRAVASTATIN SODIUM 40 MG PO TABS
80.0000 mg | ORAL_TABLET | Freq: Every day | ORAL | Status: DC
  Administered 2015-03-06 – 2015-03-09 (×4): 80 mg via ORAL
  Filled 2015-03-06 (×5): qty 2

## 2015-03-06 MED ORDER — ACETAMINOPHEN 650 MG RE SUPP: 650.0000 mg | Freq: Four times a day (QID) | RECTAL | Status: DC | PRN

## 2015-03-06 MED ORDER — ISOSORBIDE MONONITRATE ER 30 MG PO TB24
15.0000 mg | ORAL_TABLET | Freq: Every day | ORAL | Status: DC
  Administered 2015-03-06 – 2015-03-07 (×2): 15 mg via ORAL
  Filled 2015-03-06 (×2): qty 1

## 2015-03-06 MED ORDER — ALPRAZOLAM 0.25 MG PO TABS
0.2500 mg | ORAL_TABLET | Freq: Three times a day (TID) | ORAL | Status: DC
  Administered 2015-03-06 – 2015-03-10 (×14): 0.25 mg via ORAL
  Filled 2015-03-06 (×14): qty 1

## 2015-03-06 MED ORDER — ALBUTEROL SULFATE (2.5 MG/3ML) 0.083% IN NEBU: 2.5000 mg | INHALATION_SOLUTION | RESPIRATORY_TRACT | Status: DC | PRN

## 2015-03-06 MED ORDER — MORPHINE SULFATE (PF) 2 MG/ML IV SOLN: 2.0000 mg | INTRAVENOUS | Status: DC | PRN

## 2015-03-06 NOTE — Progress Notes (Signed)
Cbg-'76mg'$ /dl , orange juice given tolerated well.

## 2015-03-06 NOTE — Progress Notes (Signed)
As endorsed and pt claimed that he had good bm when after fleet enema given from the ED. MD aware and gave order not to give the soap sud enema.

## 2015-03-06 NOTE — ED Provider Notes (Signed)
Patient accepted in sign out pending MRI and abdominal CT.  CT consistent with constipation and possible stercoral colitis.  MRI without acute change/infarct.  Initial lactic acid normal.  Patient given enema for constipation with passage of stool.  EKG reviewed from earlier and appeared changed from patient previous, troponin elevated - these were reviewed and discussed with Dr. Marlou Porch who thought likely this was related to other issues going on and said cardiology could be reconsulted as needed after other issues addressed.  Repeat lactic acid mildly elevated patient already on fluid infusion but bolus ordered.  Patient was seen by case management and all results and plan of care were discussed at length with daughter at the bedside.  Case was discussed with Dr. Blaine Hamper who agreed with admission and patient was admitted under his care for further treatment and evaluation.  Harvel Quale, MD 03/06/15 0111

## 2015-03-06 NOTE — H&P (Addendum)
[facility] History and Physical  CHUKWUEBUKA CHURCHILL DGL:[id number] DOB: 22-Feb-1935 DOA: 03/05/2015  Referring physician: ED physician PCP: No primary care provider on file.  Specialists:   Chief Complaint: Abdominal pain, constipation, weakness  HPI: [name] is a 80 y.o. 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  Patient reports that he has been having constipation and abdominal pain for about one week. He also has weakness. His abdominal pain is located in the lower abdomen, constant, 5 out of 10 in severity, nonradiating. He does not have fever, chills, symptoms of UTI. He has left facial droop, and left sided weakness due to previous stroke. Patient denies any chest pain, shortness of breath, cough.   In ED, patient was found to have elevated blood pressure at 206/77, lactate 1.44-->2.14, INR 1.10, PTT 33, troponin 0.97, negative urinalysis, lipase 26, WBC 12.5, temperature normal, no tachycardia, AKI, negative chest x-ray. CT-head showed no acute intracranial abnormalities. MRI of brain showed remote infarction. CT abdomen/pelvis showed stercoral colitis.  Where does patient live?   At home    Can patient participate in ADLs?  Little     Review of Systems:   General: no fevers, chills, no changes in body weight, has poor appetite, has fatigue HEENT: no new blurry vision, hearing changes or sore throat. Has left eye blindness. Pulm: no dyspnea, coughing, wheezing CV: no chest pain, palpitations Abd: no nausea, vomiting, has abdominal pain, no diarrhea, has constipation GU: no dysuria, burning on urination, increased urinary frequency, hematuria  Ext: no leg edema Neuro: has left sided weakness, no new vision change or hearing loss. Skin: no rash MSK: No muscle spasm, no  deformity, no limitation of range of movement in spin Heme: No easy bruising.  Travel history: No recent long distant travel.  Allergy: No Known Allergies  Past Medical History  Diagnosis Date  . CAD (coronary artery disease)     multi vessel. s/p recent bare metal stenting of high grade proximal R CA stenosis. residual 70% : main in-stent restenosis, with distal filling of the left anterior descending from graft flow. patent bypass graft, except for 100% occlusion of saphernous vein graft - 1st diagonal branch. 3-vessel CABG 2000.  Marland Kitchen CAD (coronary artery disease)     s/p of 95% L main artery stenosis, 4/03: cutting balloon PTCA of 80% in-stent restenosis, 7/04. preserved L ventriculr function  . Carotid bruit     bilateral. no sigificant distal abd atherosclerosis by recent cath.   Marland Kitchen COPD (chronic obstructive pulmonary disease) (HCC)     ongoing tobacco   . DM2 (diabetes mellitus, type 2) ([place])   . Dyslipidemia   . HTN (hypertension)     Past Surgical History  Procedure Laterality Date  . Cardiac catheterization      Social History:  reports that he quit smoking about 7 months ago. He does not have any smokeless tobacco history on file. He reports that he does not drink alcohol or use illicit drugs.  Family History:  Family History  Problem Relation Age of Onset  . Hypertension Mother      Prior to Admission medications   Medication Sig Start Date End Date Taking? Authorizing Provider  ALPRAZolam (XANAX) 0.25 MG tablet Take 0.25 mg by mouth 3 (three) times daily.    Historical Provider, MD  amLODipine (NORVASC) 5  MG tablet Take 5 mg by mouth daily.    Historical Provider, MD  aspirin 325 MG tablet Take 325 mg by mouth daily.      Historical Provider, MD  Calcium Carbonate-Vitamin D (OS-CAL 500 + D PO) Take 1 tablet by mouth daily as needed.    Historical Provider, MD  clopidogrel (PLAVIX) 75 MG tablet Take 75 mg by mouth daily.    Historical Provider, MD  docusate sodium  (COLACE) 100 MG capsule Take 100 mg by mouth 2 (two) times daily.    Historical Provider, MD  lactulose (CHRONULAC) 10 GM/15ML solution Take 30 mLs by mouth 2 (two) times daily as needed.    Historical Provider, MD  lisinopril (PRINIVIL,ZESTRIL) 20 MG tablet Take 20 mg by mouth daily.      Historical Provider, MD  metFORMIN (GLUCOPHAGE) 500 MG tablet Take 500 mg by mouth 2 (two) times daily with a meal.    Historical Provider, MD  metoprolol (LOPRESSOR) 50 MG tablet Take 50 mg by mouth 2 (two) times daily.    Historical Provider, MD  Multiple Vitamins-Minerals (MULTIVITAMIN PO) Take 1 tablet by mouth daily as needed.    Historical Provider, MD  polyethylene glycol (MIRALAX / GLYCOLAX) packet Take 17 g by mouth daily. In 8 ounces of water    Historical Provider, MD  Potassium Chloride ER 20 MEQ TBCR Take 1 tablet by mouth daily.    Historical Provider, MD  pravastatin (PRAVACHOL) 40 MG tablet Take 40 mg by mouth daily.     Historical Provider, MD    Physical Exam: Filed Vitals:   03/06/15 0145 03/06/15 0217 03/06/15 0300 03/06/15 0400  BP: 198/83 195/78 182/87 176/84  Pulse: 70  62 66  Temp:   98.4 F (36.9 C)   TempSrc:   Oral   Resp: '18  15 11  '$ Height:      Weight:      SpO2: 93%  94% 93%   General: Not in acute distress HEENT:       Eyes: left eye blinded, Right eye with PERRL, EOMI, no scleral icterus.       ENT: No discharge from the ears and nose, no pharynx injection, no tonsillar enlargement.        Neck: No JVD, no bruit, no mass felt. Heme: No neck lymph node enlargement. Cardiac: S1/S2, RRR, No murmurs, No gallops or rubs. Pulm: No rales, wheezing, rhonchi or rubs. Abd: Soft, nondistended, tenderness over lower abdomen, no rebound pain, no organomegaly, BS present. Ext: No pitting leg edema bilaterally. 2+DP/PT pulse bilaterally. Musculoskeletal: No joint deformities, No joint redness or warmth, no limitation of ROM in spin. Skin: No rashes.  Neuro: Alert, oriented X3,  cranial nerves II-XII grossly intact except for L facial droop, muscle strength 3/5 in left arm and 4/5 in left leg; 5/5 in right extremities, sensation to light touch intact. Brachial reflex 2+ bilaterally. Knee reflex 1+ bilaterally. Negative Babinski's sign.  Psych: Patient is not psychotic, no suicidal or hemocidal ideation.  Labs on Admission:  Basic Metabolic Panel:  Recent Labs Lab 03/05/15 1448 03/06/15 0327  NA 138 141  K 4.2 4.0  CL 103 105  CO2 20* 26  GLUCOSE 191* 125*  BUN 13 12  CREATININE 1.38* 1.00  CALCIUM 9.8 9.2   Liver Function Tests:  Recent Labs Lab 03/05/15 1448  AST 41  ALT 21  ALKPHOS 94  BILITOT 0.7  PROT 8.6*  ALBUMIN 4.0    Recent Labs Lab 03/05/15  1448  LIPASE 26   No results for input(s): AMMONIA in the last 168 hours. CBC:  Recent Labs Lab 03/05/15 1448 03/06/15 0327  WBC 12.5* 10.8*  NEUTROABS 11.1*  --   HGB 15.4 14.6  HCT 45.4 42.4  MCV 84.9 86.0  PLT 258 241   Cardiac Enzymes:  Recent Labs Lab 03/06/15 0327  TROPONINI 0.64*    BNP (last 3 results) No results for input(s): BNP in the last 8760 hours.  ProBNP (last 3 results) No results for input(s): PROBNP in the last 8760 hours.  CBG: No results for input(s): GLUCAP in the last 168 hours.  Radiological Exams on Admission: Ct Abdomen Pelvis Wo Contrast  03/05/2015  CLINICAL DATA:  Lower abdominal pain and leukocytosis. EXAM: CT ABDOMEN AND PELVIS WITHOUT CONTRAST TECHNIQUE: Multidetector CT imaging of the abdomen and pelvis was performed following the standard protocol without IV contrast. COMPARISON:  None. FINDINGS: Lower chest: Right middle lobe 1.0 cm pulmonary nodule (series 3/image 4). Visualized median sternotomy wires are intact. Left circumflex and right coronary artery calcifications are noted. Hepatobiliary: Normal liver with no liver mass. Normal gallbladder with no radiopaque cholelithiasis. No biliary ductal dilatation. Pancreas: Normal, with no mass  or duct dilation. Spleen: Normal size. No mass. Adrenals/Urinary Tract: Normal adrenals. No hydronephrosis. No contour deforming renal mass. Extensive calcifications throughout the bilateral renal sinuses are favored to represent vascular calcifications. Collapsed and grossly normal bladder. Stomach/Bowel: Mildly to moderately distended stomach filled predominantly with oral contrast, with no appreciable gastric wall thickening. Likely tiny hiatal hernia. Normal caliber small bowel with no small bowel wall thickening. Normal appendix . The rectum is moderately distended with stool to a diameter of 6.3 cm. There is circumferential mild rectal wall thickening with hazy perirectal fat stranding. There is moderate stool throughout the colon, with no colonic wall thickening. No colonic diverticulosis. Vascular/Lymphatic: Atherosclerotic nonaneurysmal abdominal aorta. No pathologically enlarged lymph nodes in the abdomen or pelvis. Reproductive: Moderate prostatomegaly with mass effect on the bladder base by the enlarged nodular central lobe of the prostate. Nonspecific internal prostatic calcifications. Other: No pneumoperitoneum, ascites or focal fluid collection. Musculoskeletal: No aggressive appearing focal osseous lesions. Moderate degenerative changes in the lower lumbar spine. IMPRESSION: 1. Moderate rectal distention with stool with mild diffuse rectal wall thickening and hazy perirectal fat stranding. Findings suggest stercoral colitis. 2. Moderate colonic stool volume, suggesting constipation. 3. No evidence of small-bowel obstruction.  Normal appendix. 4. Right middle lobe 1.0 cm pulmonary nodule, neoplasm not excluded. Recommend further evaluation with PET-CT on a short-term outpatient basis. 5. Atherosclerosis, including at least two-vessel coronary artery disease. Please note that although the presence of coronary artery calcium documents the presence of coronary artery disease, the severity of this disease  and any potential stenosis cannot be assessed on this non-gated CT examination. 6. Moderate prostatomegaly. Electronically Signed   By: Ilona Sorrel M.D.   On: 03/05/2015 19:21   Ct Head Wo Contrast  03/05/2015  CLINICAL DATA:  80 year old male with history of left-sided weakness. Abdominal pain. EXAM: CT HEAD WITHOUT CONTRAST TECHNIQUE: Contiguous axial images were obtained from the base of the skull through the vertex without intravenous contrast. COMPARISON:  Head CT 11/08/2013. FINDINGS: Mild cerebral atrophy. Patchy and confluent areas of decreased attenuation are noted throughout the deep and periventricular white matter of the cerebral hemispheres bilaterally, compatible with chronic microvascular ischemic disease. Large old lacunar infarct extending from the head of the left caudate nucleus posteriorly into the adjacent white matter tracts.  There is also encephalomalacia from remote high right parietal infarct and posterior right occipital infarct, which are both unchanged. Left globe is atrophic and slightly sclerotic, similar to prior examinations. No acute intracranial abnormalities. Specifically, no evidence of acute intracranial hemorrhage, no definite findings of acute/subacute cerebral ischemia, no mass, mass effect, hydrocephalus or abnormal intra or extra-axial fluid collections. Visualized paranasal sinuses and mastoids are well pneumatized. No acute displaced skull fractures are identified. IMPRESSION: 1. No acute intracranial abnormalities. 2. Mild cerebral atrophy with extensive chronic ischemic changes redemonstrated, as above. Electronically Signed   By: Vinnie Langton M.D.   On: 03/05/2015 15:10   Mr Brain Wo Contrast  03/05/2015  CLINICAL DATA:  Left-sided weakness, worse than usual. Personal history previous infarct. EXAM: MRI HEAD WITHOUT CONTRAST TECHNIQUE: Multiplanar, multiecho pulse sequences of the brain and surrounding structures were obtained without intravenous contrast.  COMPARISON:  CT head without contrast from the same day. MRI brain 10/14/2014. FINDINGS: The diffusion-weighted images demonstrate no evidence for acute or subacute infarction. The remote right occipital lobe infarct is again seen. The remote infarct of the left basal ganglia is stable. A remote right pontine infarct is evident. Moderate generalized atrophy is present. Confluent periventricular white matter disease is seen bilaterally with additional remote lacunar infarcts involving the corona radiata on the right. No acute infarct, hemorrhage, or mass lesion is present. The ventricles are proportionate to the degree of atrophy. The internal auditory canals are within normal limits. Cerebellum was unremarkable. Abnormal signal is again noted within the right internal carotid artery. There is flow in the left internal carotid artery and in the MCA vessels bilaterally. Flow is present in the left vertebral artery and basilar artery. There is abnormal signal in the right vertebral artery, suggesting chronic occlusion. The left globe is chronically collapsed. The right globe and orbit are within normal limits. Mild mucosal thickening is present in the ethmoid air cells and maxillary sinuses. The mastoid air cells are clear. IMPRESSION: 1. Stable remote infarcts including the right cerebellum, left greater than right basal ganglia, brainstem, and right greater than left coronal radiata. 2. No acute intracranial abnormality. 3. Abnormal or occluded flow in the right internal carotid artery and right vertebral artery is stable. Electronically Signed   By: San Morelle M.D.   On: 03/05/2015 18:59   Dg Chest Portable 1 View  03/05/2015  CLINICAL DATA:  Abdominal pain and dyspnea. EXAM: PORTABLE CHEST 1 VIEW COMPARISON:  None. FINDINGS: A single AP portable view of the chest demonstrates no focal airspace consolidation or alveolar edema. The lungs are grossly clear. There is no large effusion or pneumothorax.  Cardiac and mediastinal contours appear unremarkable. IMPRESSION: No active disease. Electronically Signed   By: Andreas Newport M.D.   On: 03/05/2015 22:38    EKG: Independently reviewed.  Abnormal findings: QTC 517, LAE, widening QRS, LVH, diffuse T-wave inversion,     Assessment/Plan Principal Problem:   Abdominal pain Active Problems:   COPD (chronic obstructive pulmonary disease) (HCC)   HLD (hyperlipidemia)   NONDEPENDENT TOBACCO USE DISORDER   Coronary atherosclerosis   PVD   Hypertensive emergency   Constipation   Sepsis ([place])   Anxiety   AKI (acute kidney injury) ([place])   Elevated troponin   Diabetes mellitus without complication (HCC)  Abdominal pain and sepsis: Etiology for abdominal pain is not clear. Urinalysis negative. Lipase negative. Patient has severe constipation which may be the etiology. CT abdomen/pelvis showed possible stercoral colitis, which is most likely due  to constipation, however patient is septic on admission with elevated lactate, leukocytosis and tachypnea, will start antibiotics.  -will admit to SDU -start cipro and flagyl -will get Procalcitonin and trend lactic acid levels per sepsis protocol. -IVF: Ed gave 500 cc NS, will given 1.5 L and followed by 100 cc/h  -F/u blood culture x 2  Hypertensive emergency: Blood pressure is elevated at 206/77. Patient has worsening renal function, elevated troponin consistent with hypertensive emergency. - When necessary labetalol until SBP<170 - NTG patch - continue amlodipine, metoprolol  CAD and elevated trop: Troponin 0.97. ED physician called cardiology, Dr. Marlou Porch, who did not think this is ACS. This is likely due to demanding ischemia secondary to hypertensive emergency   -cycle CE q6 x3 and repeat her EKG in the am  - on Nitroglycerin pathc - Aspirin and pravastatin and plavix - Risk factor stratification: will check FLP and A1C  - 2d echo  Constipation: Patient feels a little better after  enema in emergency room. -MiraLAX twice a day, lactulose, Colace  COPD (chronic obstructive pulmonary disease) ([place]): stable -Albuterol when necessary  HLD: Last LDL was 46 on 11/23/07 -Continue home medications: Pravastatin -Check FLP  Tobacco abuse and Alcohol abuse: -Did counseling about importance of quitting smoking -Nicotine patch  AKI: Likely due to prerenal secondary to dehydration and continuation of ACEI. No hydronephrosis on CT abdomen/pelvis  - IVF as above - Check FeNa - Follow up renal function by BMP - Hold lisinopril  DM-II: Last A1c 8.6 on 07/17/07, poorly controled. Patient is taking metformin at home -SSI -Check A1c   DVT ppx: SQ Heparin     Code Status: Full code Family Communication: None at bed side.     Disposition Plan: Admit to inpatient   Date of Service 03/06/2015    Ivor Costa [facility] Pager [phone]  If 7PM-7AM, please contact night-coverage [email or web address] Password [email or web address] 03/06/2015, 5:57 AM

## 2015-03-06 NOTE — Progress Notes (Signed)
[facility] PROGRESS NOTE  [name] AST:[id number] DOB: Jul 27, 1934 DOA: 03/05/2015 PCP: No primary care provider on file.  Brief Summary  [name] is a 80 y.o. male with PMH of hypertension, hyperlipidemia, diabetes mellitus, COPD, anxiety, tobacco abuse, CAD, S/P stent placement, and in-stent restenosis, PVD, left eye blindness due to glaucoma, who presented with a 1-week history of abdominal pain, constipation, and progressive weakness.  His abdominal pain was located in the lower abdomen, constant, 5 out of 10 in severity, nonradiating. He denied fever, chills, symptoms of UTI. He has left facial droop, and left sided weakness due to previous strokes. Patient denied chest pain, shortness of breath, cough.  In ED, blood pressure 206/77, lactate 1.44-->2.14, troponin 0.97, negative urinalysis, lipase 26, WBC 12.5, temperature normal, no tachycardia, AKI, negative chest x-ray. CT-head showed no acute intracranial abnormalities. MRI of brain showed remote infarction. CT abdomen/pelvis showed stercoral colitis.  Assessment/Plan  Abdominal pain and possible sepsis:  Abdominal pain likely related to his severe constipation.  He was given an enema and had relief of the majority of his abdominal pain.   -  Continue miralax BID and colace -  Continue ciprofloxacin and flagyl for colitis -  Lactic acid trending down - IVF: Ed gave 500 cc NS, will given 1.5 L and followed by 100 cc/h  -  F/u blood culture x 2  Hypertensive emergency: Blood pressure initially 206/77. Patient has worsening renal function, elevated troponin consistent with hypertensive emergency.  - When necessary labetalol until SBP<170 - NTG patch - continue amlodipine, metoprolol -  Resume ACEI now that creatinine improved -  troponins flat  CAD and elevated trop: Troponin 0.97. ED physician called cardiology, Dr. Marlou Porch, who did not think this is ACS. This is likely due to demanding ischemia secondary to  hypertensive emergency -  Repeat troponin this afternoon -  D/c NTG patch -  Continue BB, aspirin and pravastatin and plavix -  LDL above goal at 81 -  A1c pending - 2d echo pending  COPD (chronic obstructive pulmonary disease) ([place]): stable -Albuterol when necessary  HLD: Last LDL was 46 on 11/23/07, currently 81 - increase Pravastatin  Tobacco abuse and Alcohol abuse: -Did counseling about importance of quitting smoking -Nicotine patch  AKI: Likely due to prerenal secondary to dehydration and continuation of ACEI. No hydronephrosis on CT abdomen/pelvis.  Creatinine down to 1.  DM-II: Last A1c 8.6 on 07/17/07, poorly controled. Patient is taking metformin at home -SSI -Check A1c  Generalized weakness superimposed on previous stroke -  PT eval  Diet:  CLD Access:  PIV IVF:  yes Proph:  lovenox  Code Status: full Family Communication: patient alone Disposition Plan: pending tolerating a regular consistency diet, abdominal pain improved, results of ECHO.  PT eval pending   Consultants:  Dr. Candee Furbish via phone  Procedures:  CT abd/pelvis  MRI brain  CT head  Antibiotics:  Ciprofloxacin 11/3  Flagyl 11/3   HPI/Subjective:  Denies CP, SOB, lightheadedness.  States he has been compliant with his medications at home.  Abdominal pain markedly improved after enema.    Objective: Filed Vitals:   03/06/15 0600 03/06/15 0800 03/06/15 1000 03/06/15 1100  BP: 181/78 132/73 133/93 151/68  Pulse: 53 54 58 51  Temp:  97.8 F (36.6 C)  97.9 F (36.6 C)  TempSrc:  Oral  Oral  Resp: 18 13 0 14  Height:      Weight:      SpO2: 94% 95%  95% 95%    Intake/Output Summary (Last 24 hours) at 03/06/15 1331 Last data filed at 03/06/15 1200  Gross per 24 hour  Intake   1580 ml  Output    675 ml  Net    905 ml   Filed Weights   03/05/15 1345  Weight: 72.576 kg (160 lb)   Body mass index is 23.62 kg/(m^2).  Exam:   General:  Adult male, No acute distress,  blind with left facial droop  HEENT:  NCAT, MMM  Cardiovascular:  RRR, nl S1, S2, 2+ pulses, warm extremities  Respiratory:  CTAB, no increased WOB  Abdomen:   NABS, soft, ND, mildly TTP diffusely without rebound or guarding  MSK:   Normal tone and bulk, no LEE  Neuro:  Left hemiparesis  Data Reviewed: Basic Metabolic Panel:  Recent Labs Lab 03/05/15 1448 03/06/15 0327  NA 138 141  K 4.2 4.0  CL 103 105  CO2 20* 26  GLUCOSE 191* 125*  BUN 13 12  CREATININE 1.38* 1.00  CALCIUM 9.8 9.2   Liver Function Tests:  Recent Labs Lab 03/05/15 1448  AST 41  ALT 21  ALKPHOS 94  BILITOT 0.7  PROT 8.6*  ALBUMIN 4.0    Recent Labs Lab 03/05/15 1448  LIPASE 26   No results for input(s): AMMONIA in the last 168 hours. CBC:  Recent Labs Lab 03/05/15 1448 03/06/15 0327  WBC 12.5* 10.8*  NEUTROABS 11.1*  --   HGB 15.4 14.6  HCT 45.4 42.4  MCV 84.9 86.0  PLT 258 241    Recent Results (from the past 240 hour(s))  MRSA PCR Screening     Status: None   Collection Time: 03/06/15  2:40 AM  Result Value Ref Range Status   MRSA by PCR NEGATIVE NEGATIVE Final    Comment:        The GeneXpert MRSA Assay (FDA approved for NASAL specimens only), is one component of a comprehensive MRSA colonization surveillance program. It is not intended to diagnose MRSA infection nor to guide or monitor treatment for MRSA infections.      Studies: Ct Abdomen Pelvis Wo Contrast  03/05/2015  CLINICAL DATA:  Lower abdominal pain and leukocytosis. EXAM: CT ABDOMEN AND PELVIS WITHOUT CONTRAST TECHNIQUE: Multidetector CT imaging of the abdomen and pelvis was performed following the standard protocol without IV contrast. COMPARISON:  None. FINDINGS: Lower chest: Right middle lobe 1.0 cm pulmonary nodule (series 3/image 4). Visualized median sternotomy wires are intact. Left circumflex and right coronary artery calcifications are noted. Hepatobiliary: Normal liver with no liver mass.  Normal gallbladder with no radiopaque cholelithiasis. No biliary ductal dilatation. Pancreas: Normal, with no mass or duct dilation. Spleen: Normal size. No mass. Adrenals/Urinary Tract: Normal adrenals. No hydronephrosis. No contour deforming renal mass. Extensive calcifications throughout the bilateral renal sinuses are favored to represent vascular calcifications. Collapsed and grossly normal bladder. Stomach/Bowel: Mildly to moderately distended stomach filled predominantly with oral contrast, with no appreciable gastric wall thickening. Likely tiny hiatal hernia. Normal caliber small bowel with no small bowel wall thickening. Normal appendix . The rectum is moderately distended with stool to a diameter of 6.3 cm. There is circumferential mild rectal wall thickening with hazy perirectal fat stranding. There is moderate stool throughout the colon, with no colonic wall thickening. No colonic diverticulosis. Vascular/Lymphatic: Atherosclerotic nonaneurysmal abdominal aorta. No pathologically enlarged lymph nodes in the abdomen or pelvis. Reproductive: Moderate prostatomegaly with mass effect on the bladder base by the enlarged nodular central  lobe of the prostate. Nonspecific internal prostatic calcifications. Other: No pneumoperitoneum, ascites or focal fluid collection. Musculoskeletal: No aggressive appearing focal osseous lesions. Moderate degenerative changes in the lower lumbar spine. IMPRESSION: 1. Moderate rectal distention with stool with mild diffuse rectal wall thickening and hazy perirectal fat stranding. Findings suggest stercoral colitis. 2. Moderate colonic stool volume, suggesting constipation. 3. No evidence of small-bowel obstruction.  Normal appendix. 4. Right middle lobe 1.0 cm pulmonary nodule, neoplasm not excluded. Recommend further evaluation with PET-CT on a [facility]-term outpatient basis. 5. Atherosclerosis, including at least two-vessel coronary artery disease. Please note that although the  presence of coronary artery calcium documents the presence of coronary artery disease, the severity of this disease and any potential stenosis cannot be assessed on this non-gated CT examination. 6. Moderate prostatomegaly. Electronically Signed   By: Ilona Sorrel M.D.   On: 03/05/2015 19:21   Ct Head Wo Contrast  03/05/2015  CLINICAL DATA:  80 year old male with history of left-sided weakness. Abdominal pain. EXAM: CT HEAD WITHOUT CONTRAST TECHNIQUE: Contiguous axial images were obtained from the base of the skull through the vertex without intravenous contrast. COMPARISON:  Head CT 11/08/2013. FINDINGS: Mild cerebral atrophy. Patchy and confluent areas of decreased attenuation are noted throughout the deep and periventricular white matter of the cerebral hemispheres bilaterally, compatible with chronic microvascular ischemic disease. Large old lacunar infarct extending from the head of the left caudate nucleus posteriorly into the adjacent white matter tracts. There is also encephalomalacia from remote high right parietal infarct and posterior right occipital infarct, which are both unchanged. Left globe is atrophic and slightly sclerotic, similar to prior examinations. No acute intracranial abnormalities. Specifically, no evidence of acute intracranial hemorrhage, no definite findings of acute/subacute cerebral ischemia, no mass, mass effect, hydrocephalus or abnormal intra or extra-axial fluid collections. Visualized paranasal sinuses and mastoids are well pneumatized. No acute displaced skull fractures are identified. IMPRESSION: 1. No acute intracranial abnormalities. 2. Mild cerebral atrophy with extensive chronic ischemic changes redemonstrated, as above. Electronically Signed   By: Vinnie Langton M.D.   On: 03/05/2015 15:10   Mr Brain Wo Contrast  03/05/2015  CLINICAL DATA:  Left-sided weakness, worse than usual. Personal history previous infarct. EXAM: MRI HEAD WITHOUT CONTRAST TECHNIQUE:  Multiplanar, multiecho pulse sequences of the brain and surrounding structures were obtained without intravenous contrast. COMPARISON:  CT head without contrast from the same day. MRI brain 10/14/2014. FINDINGS: The diffusion-weighted images demonstrate no evidence for acute or subacute infarction. The remote right occipital lobe infarct is again seen. The remote infarct of the left basal ganglia is stable. A remote right pontine infarct is evident. Moderate generalized atrophy is present. Confluent periventricular white matter disease is seen bilaterally with additional remote lacunar infarcts involving the corona radiata on the right. No acute infarct, hemorrhage, or mass lesion is present. The ventricles are proportionate to the degree of atrophy. The internal auditory canals are within normal limits. Cerebellum was unremarkable. Abnormal signal is again noted within the right internal carotid artery. There is flow in the left internal carotid artery and in the MCA vessels bilaterally. Flow is present in the left vertebral artery and basilar artery. There is abnormal signal in the right vertebral artery, suggesting chronic occlusion. The left globe is chronically collapsed. The right globe and orbit are within normal limits. Mild mucosal thickening is present in the ethmoid air cells and maxillary sinuses. The mastoid air cells are clear. IMPRESSION: 1. Stable remote infarcts including the right cerebellum, left greater  than right basal ganglia, brainstem, and right greater than left coronal radiata. 2. No acute intracranial abnormality. 3. Abnormal or occluded flow in the right internal carotid artery and right vertebral artery is stable. Electronically Signed   By: San Morelle M.D.   On: 03/05/2015 18:59   Dg Chest Portable 1 View  03/05/2015  CLINICAL DATA:  Abdominal pain and dyspnea. EXAM: PORTABLE CHEST 1 VIEW COMPARISON:  None. FINDINGS: A single AP portable view of the chest demonstrates no  focal airspace consolidation or alveolar edema. The lungs are grossly clear. There is no large effusion or pneumothorax. Cardiac and mediastinal contours appear unremarkable. IMPRESSION: No active disease. Electronically Signed   By: Andreas Newport M.D.   On: 03/05/2015 22:38    Scheduled Meds: . ALPRAZolam  0.25 mg Oral TID  . amLODipine  5 mg Oral Daily  . aspirin  325 mg Oral Daily  . ciprofloxacin  400 mg Intravenous Q12H  . clopidogrel  75 mg Oral Daily  . docusate sodium  100 mg Oral BID  . heparin  5,000 Units Subcutaneous 3 times per day  . [START ON 03/07/2015] Influenza vac split quadrivalent PF  0.5 mL Intramuscular Tomorrow-1000  . insulin aspart  0-9 Units Subcutaneous TID WC  . metoprolol  50 mg Oral BID  . metronidazole  500 mg Intravenous Q8H  . nicotine  21 mg Transdermal Daily  . nitroGLYCERIN  0.2 mg Transdermal Daily  . polyethylene glycol  17 g Oral BID  . pravastatin  40 mg Oral q1800  . sodium chloride  3 mL Intravenous Q12H   Continuous Infusions: . sodium chloride 1,000 mL (03/06/15 0242)    Principal Problem:   Abdominal pain Active Problems:   COPD (chronic obstructive pulmonary disease) ([facility])   HLD (hyperlipidemia)   NONDEPENDENT TOBACCO USE DISORDER   Coronary atherosclerosis   PVD   Hypertensive emergency   Constipation   Sepsis ([place])   Anxiety   AKI (acute kidney injury) ([place])   Elevated troponin   Diabetes mellitus without complication ([place])    Time spent: 30 min    Ori Kreiter, [facility] Pager [phone]. If 7PM-7AM, please contact night-coverage at [email or web address], password [facility] 03/06/2015, 1:31 PM  LOS: 0 days

## 2015-03-06 NOTE — Evaluation (Addendum)
Physical Therapy Evaluation Patient Details Name: [name] MRN: [id number] DOB: 25-Sep-1934 Today's Date: 03/06/2015   History of Present Illness  [name] is a 80 y.o. male with PMH of hypertension, hyperlipidemia, diabetes mellitus, COPD, anxiety, tobacco abuse, CAD, S/P stent placement, and in-stent restenosis, PVD, left eye blindness due to glaucoma, who presented with a 1-week history of abdominal pain, constipation, and progressive weakness. His abdominal pain was located in the lower abdomen, constant, 5 out of 10 in severity, nonradiating. He denied fever, chills, symptoms of UTI. He has left facial droop, and left sided weakness due to previous strokes. Patient denied chest pain, shortness of breath, cough. In ED, blood pressure 206/77.  MRI showed remote infarction.   Clinical Impression  Pt admitted with above diagnosis. Pt currently with functional limitations due to the deficits listed below (see PT Problem List). Pt able to ambulate but needed mod assist as he had a lot of difficulty moving left LE and coordinating steps and RW.  May benefit from SNF prior to d/c home and will definitely need this if he does not have 24 hour care.   Pt will benefit from skilled PT to increase their independence and safety with mobility to allow discharge to the venue listed below.      Follow Up Recommendations SNF;Supervision/Assistance - 24 hour    Equipment Recommendations  Other (comment) (TBA)    Recommendations for Other Services       Precautions / Restrictions Precautions Precautions: Fall Restrictions Weight Bearing Restrictions: No      Mobility  Bed Mobility Overal bed mobility: Needs Assistance;+2 for physical assistance Bed Mobility: Supine to Sit     Supine to sit: Mod assist;+2 for physical assistance     General bed mobility comments: assist for LEs and elevation of trunk  Transfers Overall transfer level: Needs assistance Equipment used: Rolling  walker (2 wheeled) Transfers: Sit to/from Stand Sit to Stand: Mod assist;+2 physical assistance;From elevated surface         General transfer comment: Upon standing, condom cath fell off due to heavy urination upon standing.  Pt unaware.    Ambulation/Gait Ambulation/Gait assistance: Mod assist;+2 physical assistance Ambulation Distance (Feet): 15 Feet Assistive device: Rolling walker (2 wheeled) Gait Pattern/deviations: Decreased step length - left;Decreased weight shift to left;Decreased stride length;Step-to pattern;Ataxic;Staggering left;Staggering right;Drifts right/left;Wide base of support   Gait velocity interpretation: Below normal speed for age/gender General Gait Details: Pt having difficulty moving and coordinating left hemibody.  Gait poor needing mod assist to ambulate.  Assissted pt with moving RW as well as cues for postural awareness.  Eventually, brought chair to pt as he was fatiguing and needed to sit.  Changed pt gown and socks and cleaned pt appropriately.  Stairs            Wheelchair Mobility    Modified Rankin (Stroke Patients Only) Modified Rankin (Stroke Patients Only) Pre-Morbid Rankin Score: Moderate disability Modified Rankin: Severe disability     Balance Overall balance assessment: Needs assistance Sitting-balance support: Bilateral upper extremity supported;Feet supported Sitting balance-Leahy Scale: Poor Sitting balance - Comments: requires min to mod assist to sit EOB. Postural control: Posterior lean Standing balance support: Bilateral upper extremity supported;During functional activity Standing balance-Leahy Scale: Poor Standing balance comment: relies heavily on RW.                             Pertinent Vitals/Pain Pain Assessment: No/denies pain  VSS    Home Living Family/patient expects to be discharged to:: Assisted living Living Arrangements: [place]: [place] Rile - 2 wheels;Shower  seat;Bedside commode Additional Comments: Per pt daughter assisted him getting into shower but he did everything else himself.  Walked with RW per pt.    Prior Function Level of Independence: Independent with assistive device(s);Needs assistance   Gait / Transfers Assistance Needed: used RW per pt  ADL's / Homemaking Assistance Needed: daughter assisted him into shower but pt performed bath and dressing        Hand Dominance        Extremity/Trunk Assessment   Upper Extremity Assessment: Defer to OT evaluation           Lower Extremity Assessment: LLE deficits/detail   LLE Deficits / Details: grossly 3-/5     Communication   Communication: No difficulties  Cognition Arousal/Alertness: Awake/alert Behavior During Therapy: WFL for tasks assessed/performed Overall Cognitive Status: Within Functional Limits for tasks assessed                      General Comments      Exercises        Assessment/Plan    PT Assessment Patient needs continued PT services  PT Diagnosis Generalized weakness   PT Problem List Decreased activity tolerance;Decreased balance;Decreased mobility;Decreased strength;Decreased range of motion;Decreased knowledge of use of DME;Decreased safety awareness;Decreased knowledge of precautions;Decreased coordination;Decreased skin integrity  PT Treatment Interventions Functional mobility training;Therapeutic activities;Therapeutic exercise;Balance training;Patient/family education   PT Goals (Current goals can be found in the Care Plan section) Acute Rehab PT Goals Patient Stated Goal: get better PT Goal Formulation: With patient Time For Goal Achievement: 03/20/15 Potential to Achieve Goals: Fair    Frequency Min 2X/week   Barriers to discharge Other (comment) (unsure)      Co-evaluation               End of Session Equipment Utilized During Treatment: Gait belt Activity Tolerance: Patient limited by fatigue Patient left:  in chair;with call bell/phone within reach;with chair alarm set Nurse Communication: Need for lift equipment;Mobility status         Time: 1148-1209 PT Time Calculation (min) (ACUTE ONLY): 21 min   Charges:   PT Evaluation $Initial PT Evaluation Tier I: 1 Procedure     PT G CodesDenice Paradise 03/22/15, 4:20 PM Uyen Eichholz,PT Acute Rehabilitation [phone] (pager)

## 2015-03-06 NOTE — Progress Notes (Signed)
Echocardiogram 2D Echocardiogram has been performed.  [name] 03/06/2015, 4:26 PM

## 2015-03-06 NOTE — Care Management Note (Signed)
Case Management Note  Patient Details  Name: [name] MRN: [id number] Date of Birth: Dec 07, 1934  Subjective/Objective:      Adm w  abd pain              Action/Plan:  Lives w wife  Expected Discharge Date:                  Expected Discharge Plan:     In-House Referral:     Discharge planning Services     Post Acute Care Choice:    Choice offered to:     DME Arranged:    DME Agency:     HH Arranged:    [place] Agency:     Status of Service:     Medicare Important Message Given:    Date Medicare IM Given:    Medicare IM give by:    Date Additional Medicare IM Given:    Additional Medicare Important Message give by:     If discussed at [place] of Stay Meetings, dates discussed:    Additional Comments:ur review done  Lacretia Leigh, RN 03/06/2015, 8:37 AM

## 2015-03-07 DIAGNOSIS — A419 Sepsis, unspecified organism: Secondary | ICD-10-CM | POA: Diagnosis not present

## 2015-03-07 DIAGNOSIS — K559 Vascular disorder of intestine, unspecified: Secondary | ICD-10-CM

## 2015-03-07 DIAGNOSIS — R29810 Facial weakness: Secondary | ICD-10-CM

## 2015-03-07 LAB — CBC
HCT: 37.9 % — ABNORMAL LOW (ref 39.0–52.0)
HEMOGLOBIN: 12.5 g/dL — AB (ref 13.0–17.0)
MCH: 29.1 pg (ref 26.0–34.0)
MCHC: 33 g/dL (ref 30.0–36.0)
MCV: 88.3 fL (ref 78.0–100.0)
PLATELETS: 231 10*3/uL (ref 150–400)
RBC: 4.29 MIL/uL (ref 4.22–5.81)
RDW: 13.9 % (ref 11.5–15.5)
WBC: 7.8 10*3/uL (ref 4.0–10.5)

## 2015-03-07 LAB — BASIC METABOLIC PANEL
Anion gap: 9 (ref 5–15)
BUN: 8 mg/dL (ref 6–20)
CALCIUM: 8.8 mg/dL — AB (ref 8.9–10.3)
CO2: 26 mmol/L (ref 22–32)
CREATININE: 0.86 mg/dL (ref 0.61–1.24)
Chloride: 107 mmol/L (ref 101–111)
GFR calc Af Amer: >60 mL/min (ref >60)
GFR calc non Af Amer: >60 mL/min (ref >60)
GLUCOSE: 90 mg/dL (ref 65–99)
Potassium: 4.1 mmol/L (ref 3.5–5.1)
Sodium: 142 mmol/L (ref 135–145)

## 2015-03-07 LAB — GLUCOSE, CAPILLARY
GLUCOSE-CAPILLARY: 73 mg/dL (ref 65–99)
GLUCOSE-CAPILLARY: 95 mg/dL (ref 65–99)
GLUCOSE-CAPILLARY: 88 mg/dL (ref 65–99)
Glucose-Capillary: 103 mg/dL — ABNORMAL HIGH (ref 65–99)

## 2015-03-07 LAB — HEMOGLOBIN A1C
Hgb A1c MFr Bld: 6.2 % — ABNORMAL HIGH (ref 4.8–5.6)
Mean Plasma Glucose: 131 mg/dL

## 2015-03-07 MED ORDER — ISOSORBIDE MONONITRATE ER 30 MG PO TB24: 30.0000 mg | ORAL_TABLET | Freq: Every day | ORAL | Status: DC

## 2015-03-07 MED ORDER — HYDRALAZINE HCL 25 MG PO TABS
25.0000 mg | ORAL_TABLET | Freq: Three times a day (TID) | ORAL | Status: DC
  Administered 2015-03-07 (×2): 25 mg via ORAL
  Filled 2015-03-07 (×2): qty 1

## 2015-03-07 NOTE — Progress Notes (Signed)
Patient lying in bed, no distress or pain at this time. Call light within reach.

## 2015-03-07 NOTE — Evaluation (Signed)
Occupational Therapy Evaluation Patient Details Name: [name] MRN: [id number] DOB: [birth year] Today's Date: 03/07/2015    History of Present Illness [name] is a 80 y.o. male with PMH of hypertension, hyperlipidemia, diabetes mellitus, COPD, anxiety, tobacco abuse, CAD, S/P stent placement, and in-stent restenosis, PVD, left eye blindness due to glaucoma, who presented with a 1-week history of abdominal pain, constipation, and progressive weakness. His abdominal pain was located in the lower abdomen, constant, 5 out of 10 in severity, nonradiating. He denied fever, chills, symptoms of UTI. He has left facial droop, and left sided weakness due to previous strokes. Patient denied chest pain, shortness of breath, cough. In ED, blood pressure 206/77.  MRI showed remote infarction.    Clinical Impression   Pt ambulated with a walker and was assisted for tub transfer, meals and housekeeping.  He could bathe and dress himself with set up. Pt presents with generalized weakness, impaired vision (baseline) and decreased sitting and standing balance interfering with ability to perform self care.  He is +2 dependent in mobility.  Will follow acutely.  Recommending SNF for further rehab.    Follow Up Recommendations  SNF;Supervision/Assistance - 24 hour    Equipment Recommendations       Recommendations for Other Services       Precautions / Restrictions Precautions Precautions: Fall Restrictions Weight Bearing Restrictions: No      Mobility Bed Mobility Overal bed mobility: Needs Assistance;+2 for physical assistance Bed Mobility: Sit to Supine       Sit to supine: +2 for physical assistance;Mod assist      Transfers Overall transfer level: Needs assistance Equipment used: Rolling walker (2 wheeled) Transfers: Sit to/from [facility] Sit to Stand: +2 physical assistance;Mod assist Stand pivot transfers: +2 physical assistance;Mod assist             Balance     Sitting balance-Leahy Scale: Poor   Postural control: Posterior lean   Standing balance-Leahy Scale: Poor                              ADL Overall ADL's : Needs assistance/impaired Eating/Feeding: Minimal assistance;Sitting Eating/Feeding Details (indicate cue type and reason): assist for orientation Grooming: Wash/dry hands;Wash/dry face;Sitting (supported)   Upper Body Bathing: Maximal assistance;Sitting   Lower Body Bathing: Total assistance;+2 for physical assistance;Sit to/from stand   Upper Body Dressing : Moderate assistance;Sitting   Lower Body Dressing: +2 for physical assistance;Total assistance;Sit to/from stand   Toilet Transfer: +2 for physical assistance;Moderate assistance;Stand-pivot;RW Toilet Transfer Details (indicate cue type and reason): simulated to bed Toileting- Clothing Manipulation and Hygiene: +2 for physical assistance;Total assistance;Sit to/from stand               Vision Additional Comments: sees gross objects from R eye only, no vision in L   Perception     Praxis      Pertinent Vitals/Pain Pain Assessment: No/denies pain     Hand Dominance Right   Extremity/Trunk Assessment Upper Extremity Assessment Upper Extremity Assessment: Overall WFL for tasks assessed (L shoulder slightly weaker than R at baseline)   Lower Extremity Assessment Lower Extremity Assessment: Defer to PT evaluation       Communication Communication Communication: No difficulties   Cognition Arousal/Alertness: Awake/alert Behavior During Therapy: WFL for tasks assessed/performed Overall Cognitive Status: Within Functional Limits for tasks assessed  General Comments       Exercises       Shoulder Instructions      Home Living Family/patient expects to be discharged to:: Private residence Living Arrangements: Children;Other relatives Available Help at Discharge: Family;Available  PRN/intermittently Type of Home: Mobile home       Home Layout: One level     Bathroom Shower/Tub: Teacher, early years/pre: Standard     Home Equipment: Environmental consultant - 2 wheels;Shower seat;Bedside commode          Prior Functioning/Environment Level of Independence: Needs assistance  Gait / Transfers Assistance Needed: used RW per pt ADL's / Homemaking Assistance Needed: daughter assisted him into shower but pt performed bath and dressing, family does meal prep and housekeeping        OT Diagnosis: Generalized weakness;Blindness and low vision   OT Problem List: Decreased strength;Decreased activity tolerance;Impaired balance (sitting and/or standing)   OT Treatment/Interventions: Self-care/ADL training;DME and/or AE instruction;Therapeutic activities;Balance training    OT Goals(Current goals can be found in the care plan section) Acute Rehab OT Goals Patient Stated Goal: get better OT Goal Formulation: With patient Time For Goal Achievement: 03/21/15 Potential to Achieve Goals: Fair ADL Goals Pt Will Perform Grooming: with min assist;standing Pt Will Perform Upper Body Bathing: with min assist;sitting Pt Will Perform Upper Body Dressing: with min assist;sitting Pt Will Transfer to Toilet: with min assist;ambulating;bedside commode Pt Will Perform Toileting - Clothing Manipulation and hygiene: with min assist;sit to/from stand Additional ADL Goal #1: Pt will sit EOB with supervision x 10 minutes while engaged in ADL.  OT Frequency: Min 2X/week   Barriers to D/C:            Co-evaluation              End of Session Equipment Utilized During Treatment: Rolling walker;Gait belt Nurse Communication:  (ok to give diet ginger ale)  Activity Tolerance: Patient tolerated treatment well Patient left: in bed;with call bell/phone within reach   Time: 1106-1135 OT Time Calculation (min): 29 min Charges:  OT General Charges $OT Visit: 1 Procedure OT  Evaluation $Initial OT Evaluation Tier I: 1 Procedure OT Treatments $Therapeutic Activity: 8-22 mins G-Codes:    Malka So 03/07/2015, 11:40 AM  [phone]

## 2015-03-07 NOTE — Progress Notes (Signed)
[facility] PROGRESS NOTE  PRAKASH KIMBERLING DDU:[id number] DOB: 30-Oct-1934 DOA: 03/05/2015 PCP: No primary care provider on file.  Brief Summary  [name] is a 80 y.o. male with PMH of hypertension, hyperlipidemia, diabetes mellitus, COPD, anxiety, tobacco abuse, CAD, S/P stent placement, and in-stent restenosis, PVD, left eye blindness due to glaucoma, who presented with a 1-week history of abdominal pain, constipation, and progressive weakness.  His abdominal pain was located in the lower abdomen, constant, 5 out of 10 in severity, nonradiating. He denied fever, chills, symptoms of UTI. He has left facial droop, and left sided weakness due to previous strokes. Patient denied chest pain, shortness of breath, cough.  In ED, blood pressure 206/77, lactate 1.44-->2.14, troponin 0.97, negative urinalysis, lipase 26, WBC 12.5, temperature normal, no tachycardia, AKI, negative chest x-ray. CT-head showed no acute intracranial abnormalities. MRI of brain showed remote infarction. CT abdomen/pelvis showed stercoral colitis.  Assessment/Plan  Abdominal pain and possible sepsis:  Abdominal pain likely related to his severe constipation and probable ischemic colitis secondary to his constipation.  Abdominal pain continuing to improve and having BMs. -  Continue miralax BID and colace -  Continue ciprofloxacin and flagyl for colitis day 3 -  BCx NGTD   Hypertensive emergency: Blood pressure initially 206/77. Patient has worsening renal function, elevated troponin consistent with hypertensive emergency.  Blood pressures remain elevated - Continue hydralazine  - continue amlodipine, metoprolol, and lisinopril -  Increase hydralazine and imdur -  troponins flat  CAD and elevated trop: Troponin 0.97. ED physician called cardiology, Dr. Marlou Porch, who did not think this is ACS. This is likely due to demanding ischemia secondary to hypertensive emergency -  Peak troponin 0.97, but fluctuated back and  forth between 0.64 and 0.9 -  Titrate imdur -  Continue BB, aspirin and pravastatin and plavix -  LDL above goal at 81 -  A1c 6.2 - 2d echo:  Severe LVH with grade 1 DD and probable biscupid AV  COPD (chronic obstructive pulmonary disease) ([place]): stable -Albuterol when necessary  HLD: Last LDL was 46 on 11/23/07, currently 81 - increased Pravastatin  Tobacco abuse and Alcohol abuse: -Did counseling about importance of quitting smoking -Nicotine patch  AKI: Likely due to prerenal secondary to dehydration and continuation of ACEI. No hydronephrosis on CT abdomen/pelvis.  Creatinine continuing to trend down -  D/c IVF  DM-II: Last A1c 8.6 on 07/17/07, poorly controled. Patient is taking metformin at home -SSI -6.2  Generalized weakness superimposed on previous stroke with persistent worsening of the left facial droop and slurred speech.  Patient and daughter are worried.  Explained that high blood pressure, infection, and stress can cause old stroke symptoms to worsen.  Does have some forehead involvement and facial symptoms have persisted despite improvement in other medical problems.   -  PT recommending SNF -  Neurology consultation to make sure small nuclear stroke not missed or Bell's palsy  Diet:  diabetic Access:  PIV IVF:  off Proph:  lovenox  Code Status: full Family Communication: patient.  Spoke to daughter yesterday on the phone Disposition Plan: pending tolerating a regular consistency diet, abdominal pain improved, results of ECHO.  PT eval pending   Consultants:  Dr. Candee Furbish, Cardiology, via phone  Dr. Leonel Ramsay, Neurology  Procedures:  CT abd/pelvis  MRI brain  CT head  Antibiotics:  Ciprofloxacin 11/3  Flagyl 11/3   HPI/Subjective:  Continues to have worsened left facial droop and slurred speech compared to baseline.  Denies CP, SOB, lightheadedness.  Having additional bowel movements.  Blood pressures still elevated.  Objective: Filed  Vitals:   03/07/15 0335 03/07/15 0807 03/07/15 0901 03/07/15 1140  BP: 162/67 176/72  178/76  Pulse:   51 48  Temp: 97.4 F (36.3 C) 97.6 F (36.4 C)  97.7 F (36.5 C)  TempSrc: Oral Oral  Oral  Resp: '17 13  16  '$ Height:      Weight:      SpO2: 96% 97%  99%    Intake/Output Summary (Last 24 hours) at 03/07/15 1458 Last data filed at 03/07/15 1355  Gross per 24 hour  Intake   3500 ml  Output   2000 ml  Net   1500 ml   Filed Weights   03/05/15 1345  Weight: 72.576 kg (160 lb)   Body mass index is 23.62 kg/(m^2).  Exam:   General:  Adult male, No acute distress, blind with left facial droop  HEENT:  NCAT, MMM  Cardiovascular:  RRR, nl S1, S2, 2+ pulses, warm extremities  Respiratory:  CTAB, no increased WOB  Abdomen:   NABS, soft, ND, mildly TTP diffusely without rebound or guarding  MSK:   Normal tone and bulk, no LEE  Neuro:   Left facial droop obvious with some involvement of the left forehead.  Is blind, so pupillary exam is not reliable.  Does not always close left eye and difficulty opening all the way.  Strength 4/5 bilateral upper extremities and 4-/5 lower extremities and seems symmetric.  Sensation intact to light touch.  No neglect.    Data Reviewed: Basic Metabolic Panel:  Recent Labs Lab 03/05/15 1448 03/06/15 0327 03/07/15 0407  NA 138 141 142  K 4.2 4.0 4.1  CL 103 105 107  CO2 20* 26 26  GLUCOSE 191* 125* 90  BUN '13 12 8  '$ CREATININE 1.38* 1.00 0.86  CALCIUM 9.8 9.2 8.8*   Liver Function Tests:  Recent Labs Lab 03/05/15 1448  AST 41  ALT 21  ALKPHOS 94  BILITOT 0.7  PROT 8.6*  ALBUMIN 4.0    Recent Labs Lab 03/05/15 1448  LIPASE 26   No results for input(s): AMMONIA in the last 168 hours. CBC:  Recent Labs Lab 03/05/15 1448 03/06/15 0327 03/07/15 0407  WBC 12.5* 10.8* 7.8  NEUTROABS 11.1*  --   --   HGB 15.4 14.6 12.5*  HCT 45.4 42.4 37.9*  MCV 84.9 86.0 88.3  PLT 258 241 231    Recent Results (from the past  240 hour(s))  MRSA PCR Screening     Status: None   Collection Time: 03/06/15  2:40 AM  Result Value Ref Range Status   MRSA by PCR NEGATIVE NEGATIVE Final    Comment:        The GeneXpert MRSA Assay (FDA approved for NASAL specimens only), is one component of a comprehensive MRSA colonization surveillance program. It is not intended to diagnose MRSA infection nor to guide or monitor treatment for MRSA infections.      Studies: Ct Abdomen Pelvis Wo Contrast  03/05/2015  CLINICAL DATA:  Lower abdominal pain and leukocytosis. EXAM: CT ABDOMEN AND PELVIS WITHOUT CONTRAST TECHNIQUE: Multidetector CT imaging of the abdomen and pelvis was performed following the standard protocol without IV contrast. COMPARISON:  None. FINDINGS: Lower chest: Right middle lobe 1.0 cm pulmonary nodule (series 3/image 4). Visualized median sternotomy wires are intact. Left circumflex and right coronary artery calcifications are noted. Hepatobiliary: Normal liver with no liver  mass. Normal gallbladder with no radiopaque cholelithiasis. No biliary ductal dilatation. Pancreas: Normal, with no mass or duct dilation. Spleen: Normal size. No mass. Adrenals/Urinary Tract: Normal adrenals. No hydronephrosis. No contour deforming renal mass. Extensive calcifications throughout the bilateral renal sinuses are favored to represent vascular calcifications. Collapsed and grossly normal bladder. Stomach/Bowel: Mildly to moderately distended stomach filled predominantly with oral contrast, with no appreciable gastric wall thickening. Likely tiny hiatal hernia. Normal caliber small bowel with no small bowel wall thickening. Normal appendix . The rectum is moderately distended with stool to a diameter of 6.3 cm. There is circumferential mild rectal wall thickening with hazy perirectal fat stranding. There is moderate stool throughout the colon, with no colonic wall thickening. No colonic diverticulosis. Vascular/Lymphatic: Atherosclerotic  nonaneurysmal abdominal aorta. No pathologically enlarged lymph nodes in the abdomen or pelvis. Reproductive: Moderate prostatomegaly with mass effect on the bladder base by the enlarged nodular central lobe of the prostate. Nonspecific internal prostatic calcifications. Other: No pneumoperitoneum, ascites or focal fluid collection. Musculoskeletal: No aggressive appearing focal osseous lesions. Moderate degenerative changes in the lower lumbar spine. IMPRESSION: 1. Moderate rectal distention with stool with mild diffuse rectal wall thickening and hazy perirectal fat stranding. Findings suggest stercoral colitis. 2. Moderate colonic stool volume, suggesting constipation. 3. No evidence of small-bowel obstruction.  Normal appendix. 4. Right middle lobe 1.0 cm pulmonary nodule, neoplasm not excluded. Recommend further evaluation with PET-CT on a [place]-term outpatient basis. 5. Atherosclerosis, including at least two-vessel coronary artery disease. Please note that although the presence of coronary artery calcium documents the presence of coronary artery disease, the severity of this disease and any potential stenosis cannot be assessed on this non-gated CT examination. 6. Moderate prostatomegaly. Electronically Signed   By: Ilona Sorrel M.D.   On: 03/05/2015 19:21   Mr Brain Wo Contrast  03/05/2015  CLINICAL DATA:  Left-sided weakness, worse than usual. Personal history previous infarct. EXAM: MRI HEAD WITHOUT CONTRAST TECHNIQUE: Multiplanar, multiecho pulse sequences of the brain and surrounding structures were obtained without intravenous contrast. COMPARISON:  CT head without contrast from the same day. MRI brain 10/14/2014. FINDINGS: The diffusion-weighted images demonstrate no evidence for acute or subacute infarction. The remote right occipital lobe infarct is again seen. The remote infarct of the left basal ganglia is stable. A remote right pontine infarct is evident. Moderate generalized atrophy is present.  Confluent periventricular white matter disease is seen bilaterally with additional remote lacunar infarcts involving the corona radiata on the right. No acute infarct, hemorrhage, or mass lesion is present. The ventricles are proportionate to the degree of atrophy. The internal auditory canals are within normal limits. Cerebellum was unremarkable. Abnormal signal is again noted within the right internal carotid artery. There is flow in the left internal carotid artery and in the MCA vessels bilaterally. Flow is present in the left vertebral artery and basilar artery. There is abnormal signal in the right vertebral artery, suggesting chronic occlusion. The left globe is chronically collapsed. The right globe and orbit are within normal limits. Mild mucosal thickening is present in the ethmoid air cells and maxillary sinuses. The mastoid air cells are clear. IMPRESSION: 1. Stable remote infarcts including the right cerebellum, left greater than right basal ganglia, brainstem, and right greater than left coronal radiata. 2. No acute intracranial abnormality. 3. Abnormal or occluded flow in the right internal carotid artery and right vertebral artery is stable. Electronically Signed   By: San Morelle M.D.   On: 03/05/2015 18:59  Dg Chest Portable 1 View  03/05/2015  CLINICAL DATA:  Abdominal pain and dyspnea. EXAM: PORTABLE CHEST 1 VIEW COMPARISON:  None. FINDINGS: A single AP portable view of the chest demonstrates no focal airspace consolidation or alveolar edema. The lungs are grossly clear. There is no large effusion or pneumothorax. Cardiac and mediastinal contours appear unremarkable. IMPRESSION: No active disease. Electronically Signed   By: Andreas Newport M.D.   On: 03/05/2015 22:38    Scheduled Meds: . ALPRAZolam  0.25 mg Oral TID  . amLODipine  5 mg Oral Daily  . aspirin  325 mg Oral Daily  . ciprofloxacin  400 mg Intravenous Q12H  . clopidogrel  75 mg Oral Daily  . docusate sodium   100 mg Oral BID  . heparin  5,000 Units Subcutaneous 3 times per day  . hydrALAZINE  25 mg Oral TID  . insulin aspart  0-9 Units Subcutaneous TID WC  . isosorbide mononitrate  15 mg Oral Daily  . lisinopril  20 mg Oral Daily  . metoprolol  50 mg Oral BID  . metronidazole  500 mg Intravenous Q8H  . nicotine  21 mg Transdermal Daily  . polyethylene glycol  17 g Oral BID  . pravastatin  80 mg Oral q1800  . sodium chloride  3 mL Intravenous Q12H   Continuous Infusions:    Principal Problem:   Abdominal pain Active Problems:   COPD (chronic obstructive pulmonary disease) (H[facility])   HLD (hyperlipidemia)   NONDEPENDENT TOBACCO USE DISORDER   Coronary atherosclerosis   PVD   Hypertensive emergency   Constipation   Sepsis ([place])   Anxiety   AKI (acute kidney injury) ([place])   Elevated troponin   Diabetes mellitus without complication ([place])    Time spent: 30 min    Yeni Jiggetts, [facility] Pager [phone]. If 7PM-7AM, please contact night-coverage at [email or web address], password [email or web address] 03/07/2015, 2:58 PM  LOS: 1 day

## 2015-03-07 NOTE — Consult Note (Signed)
Neurology Consultation Reason for Consult: facial droop Referring Physician: Short, M  CC: Facial droop.   History is obtained from:Patient, medical record.   HPI: [name] is a 80 y.o. male with a history of multiple previous strokes who presented with abdominal pain, worsening weakness. His son have an elevated lactate, severe constipation, possible ischemic colitis and was started on Cipro and Flagyl.  Currently his family has noticed that his facial droop appears worse than his typical. An MRI shows no new infarction. Neurology was consulted for further recommendations for workup.    ROS: A 14 point ROS was performed and is negative except as noted in the HPI.  Past Medical History  Diagnosis Date  . CAD (coronary artery disease)     multi vessel. s/p recent bare metal stenting of high grade proximal R CA stenosis. residual 70% : main in-stent restenosis, with distal filling of the left anterior descending from graft flow. patent bypass graft, except for 100% occlusion of saphernous vein graft - 1st diagonal branch. 3-vessel CABG 2000.  Marland Kitchen CAD (coronary artery disease)     s/p of 95% L main artery stenosis, 4/03: cutting balloon PTCA of 80% in-stent restenosis, 7/04. preserved L ventriculr function  . Carotid bruit     bilateral. no sigificant distal abd atherosclerosis by recent cath.   Marland Kitchen COPD (chronic obstructive pulmonary disease) (HCC)     ongoing tobacco   . DM2 (diabetes mellitus, type 2) ([place])   . Dyslipidemia   . HTN (hypertension)      Family History  Problem Relation Age of Onset  . Hypertension Mother      Social History:  reports that he quit smoking about 7 months ago. He does not have any smokeless tobacco history on file. He reports that he does not drink alcohol or use illicit drugs.   Exam: Current vital signs: BP 170/62 mmHg  Pulse 48  Temp(Src) 97.7 F (36.5 C) (Oral)  Resp 17  Ht '5\' 9"'$  (1.753 m)  Wt 72.576 kg (160 lb)  BMI 23.62 kg/m2   SpO2 100% Vital signs in last 24 hours: Temp:  [97.3 F (36.3 C)-97.7 F (36.5 C)] 97.7 F (36.5 C) (11/04 1809) Pulse Rate:  [48-51] 48 (11/04 1809) Resp:  [13-18] 17 (11/04 1809) BP: (130-196)/(56-115) 170/62 mmHg (11/04 1809) SpO2:  [96 %-100 %] 100 % (11/04 1809)   Physical Exam  Constitutional: Appears elderly Psych: Affect appropriate to situation Eyes: Left eye is postsurgical HENT: No OP obstrucion Head: Normocephalic.  Cardiovascular: Slightly bradycardic Respiratory: Effort normal Skin: WDI  Neuro: Mental Status: Patient is awake, alert, oriented to person, place, month, year. Patient is able to give a clear and coherent history. No signs of aphasia or neglect Cranial Nerves: II: He has severely impaired vision in both eyes, is able to see finger wiggling inconsistently. Left pupil is postsurgical, right pupil does react some. III,IV, VI: Eyes are slightly disconjugate V: Facial sensation is symmetric to temperature VII: Facial movement is notable for significant left droop. VIII: hearing is intact to voice X: Uvula elevates symmetrically XI: Shoulder shrug is symmetric. XII: tongue is midline without atrophy or fasciculations.  Motor: He has a mild spastic left hemiparesis, good strength on the right.  Sensory: Sensation is symmetric to light touch  in the arms and legs. Cerebellar: FNF intact on the right, mildly impaired on the left.     I have reviewed labs in epic and the results pertinent to this consultation are: Bmp -  unremarkable  I have reviewed the images obtained:MRI brain - no acute infarct, multipel old infarcts.   Impression: 80 yo M with worsening of his old deficits in the setting of inflammatory process. This is most consistent with a "peeling the onion" process, that is worsening of previous stroke deficits in the setting of a physiological stressor. Once a physiological stressor improves, his old stroke deficits should return to his  previous baseline. Given his negative imaging, there is no reason to suspect recurrent infarct at this time.  Recommendations: 1) no further workup of his worsening deficits is needed at this time. 2) neurology to sign off, please call with any further questions.   Roland Rack, MD [facility] [phone]  If 7pm- 7am, please page neurology on call as listed in [place].

## 2015-03-07 NOTE — Progress Notes (Signed)
Order to transfer verified. Room 2W3, Pt informed and verbalized understanding. Attempt to call Pt's daughter futile, left message on voicemail. Report called to 2W RN. Elink and Butler notified. Pt's belongings packed, pt transported via bed with belongings. Will cont to monitor.

## 2015-03-07 NOTE — Progress Notes (Signed)
03/07/2015 1810 Received pt to room from [place].  Pt is A&O, no c/o voiced.  Pt is blind in L eye and can only see a little out of the R eye.  Instructed to please call when needs help.  Tele monitor applied and CCMD notified.  Oriented to room, call light and bed.  Call bell in reach. [place]

## 2015-03-08 DIAGNOSIS — E119 Type 2 diabetes mellitus without complications: Secondary | ICD-10-CM

## 2015-03-08 DIAGNOSIS — K559 Vascular disorder of intestine, unspecified: Secondary | ICD-10-CM

## 2015-03-08 LAB — CBC
HCT: 37.2 % — ABNORMAL LOW (ref 39.0–52.0)
HEMOGLOBIN: 12.2 g/dL — AB (ref 13.0–17.0)
MCH: 28.8 pg (ref 26.0–34.0)
MCHC: 32.8 g/dL (ref 30.0–36.0)
MCV: 87.7 fL (ref 78.0–100.0)
Platelets: 241 10*3/uL (ref 150–400)
RBC: 4.24 MIL/uL (ref 4.22–5.81)
RDW: 13.7 % (ref 11.5–15.5)
WBC: 7.2 10*3/uL (ref 4.0–10.5)

## 2015-03-08 LAB — GLUCOSE, CAPILLARY
GLUCOSE-CAPILLARY: 139 mg/dL — AB (ref 65–99)
GLUCOSE-CAPILLARY: 157 mg/dL — AB (ref 65–99)
Glucose-Capillary: 125 mg/dL — ABNORMAL HIGH (ref 65–99)
Glucose-Capillary: 123 mg/dL — ABNORMAL HIGH (ref 65–99)

## 2015-03-08 LAB — BASIC METABOLIC PANEL
ANION GAP: 8 (ref 5–15)
BUN: 7 mg/dL (ref 6–20)
CALCIUM: 8.9 mg/dL (ref 8.9–10.3)
CO2: 27 mmol/L (ref 22–32)
Chloride: 108 mmol/L (ref 101–111)
Creatinine, Ser: 0.85 mg/dL (ref 0.61–1.24)
Glucose, Bld: 139 mg/dL — ABNORMAL HIGH (ref 65–99)
Potassium: 3.8 mmol/L (ref 3.5–5.1)
Sodium: 143 mmol/L (ref 135–145)

## 2015-03-08 MED ORDER — ISOSORB DINITRATE-HYDRALAZINE 20-37.5 MG PO TABS
1.0000 | ORAL_TABLET | Freq: Three times a day (TID) | ORAL | Status: DC
  Administered 2015-03-08 – 2015-03-10 (×7): 1 via ORAL
  Filled 2015-03-08 (×7): qty 1

## 2015-03-08 MED ORDER — AMLODIPINE BESYLATE 10 MG PO TABS
10.0000 mg | ORAL_TABLET | Freq: Every day | ORAL | Status: DC
  Administered 2015-03-08 – 2015-03-09 (×2): 10 mg via ORAL
  Filled 2015-03-08 (×2): qty 1

## 2015-03-08 MED ORDER — LISINOPRIL 40 MG PO TABS
40.0000 mg | ORAL_TABLET | Freq: Every day | ORAL | Status: DC
  Administered 2015-03-08 – 2015-03-09 (×2): 40 mg via ORAL
  Filled 2015-03-08 (×2): qty 1

## 2015-03-08 MED ORDER — SENNA 8.6 MG PO TABS
1.0000 | ORAL_TABLET | Freq: Every day | ORAL | Status: DC
  Administered 2015-03-08 – 2015-03-09 (×2): 8.6 mg via ORAL
  Filled 2015-03-08 (×2): qty 1

## 2015-03-08 MED ORDER — LABETALOL HCL 200 MG PO TABS
200.0000 mg | ORAL_TABLET | Freq: Three times a day (TID) | ORAL | Status: DC
  Administered 2015-03-08 – 2015-03-10 (×8): 200 mg via ORAL
  Filled 2015-03-08 (×8): qty 1

## 2015-03-08 NOTE — NC FL2 (Signed)
[place] LEVEL OF CARE SCREENING TOOL     IDENTIFICATION  Patient Name: [name] Birthdate: 1935/04/28 Sex: male Admission Date (Current Location): 03/05/2015  [place] and [place] Number:  [facility] )   Facility and Address:  [facility] [place], [place] [address] [place], [place] [address]      Provider Number: [id number]  Attending Physician Name and Address:  Janece Canterbury, MD  Relative Name and Phone Number:   Enid Derry and Cora Daniels [phone])    Current Level of Care: Hospital Recommended Level of Care: [place] Prior Approval Number:    Date Approved/Denied:   PASRR Number: [id number]  Discharge Plan: SNF    Current Diagnoses: Patient Active Problem List   Diagnosis Date Noted  . Colitis, ischemic ([place]) 03/07/2015  . Facial droop 03/07/2015  . Hypertensive emergency 03/06/2015  . Constipation 03/06/2015  . Abdominal pain 03/06/2015  . Sepsis ([place]) 03/06/2015  . Anxiety 03/06/2015  . AKI (acute kidney injury) ([place]) 03/06/2015  . Elevated troponin 03/06/2015  . Diabetes mellitus without complication ([place]) 10/27/9483  . Abnormal EKG   . COPD (chronic obstructive pulmonary disease) ([place]) 11/18/2009  . HLD (hyperlipidemia) 11/18/2009  . NONDEPENDENT TOBACCO USE DISORDER 11/18/2009  . HYPERTENSION 11/18/2009  . INTERMEDIATE CORONARY SYNDROME 11/18/2009  . Coronary atherosclerosis 11/18/2009  . PVD 11/18/2009  . CHRONIC AIRWAY OBSTRUCTION NEC 11/18/2009  . PERSONAL HX TIA & CI W/O RESIDUAL DEFICITS 11/18/2009  . POSTSURGICAL AORTOCORONARY BYPASS STATUS 11/18/2009  . POSTSURG PERCUT TRANSLUMINAL COR ANGPLSTY STS 11/18/2009    Orientation ACTIVITIES/SOCIAL BLADDER RESPIRATION    Self, Place  Family supportive Continent Normal  BEHAVIORAL SYMPTOMS/MOOD NEUROLOGICAL BOWEL NUTRITION STATUS   (None )  (None ) Continent Diet (Carb Modified )  PHYSICIAN VISITS COMMUNICATION OF NEEDS Height &  Weight Skin    Verbally '5\' 9"'$  (175.3 cm) 160 lbs. Normal          AMBULATORY STATUS RESPIRATION    Assist extensive Normal      Personal Care Assistance Level of Assistance  Bathing, Dressing Bathing Assistance: Maximum assistance   Dressing Assistance: Maximum assistance      Functional Limitations Info   (None )             SPECIAL CARE FACTORS FREQUENCY  PT (By licensed PT), OT (By licensed OT)     PT Frequency: 2 OT Frequency: 2           Additional Factors Info  Code Status, Allergies Code Status Info: FULL Allergies Info: N/A           Current Medications (03/08/2015): Current Facility-Administered Medications  Medication Dose Route Frequency Provider Last Rate Last Dose  . acetaminophen (TYLENOL) tablet 650 mg  650 mg Oral Q6H PRN Ivor Costa, MD   650 mg at 03/08/15 1053   Or  . acetaminophen (TYLENOL) suppository 650 mg  650 mg Rectal Q6H PRN Ivor Costa, MD      . albuterol (PROVENTIL) (2.5 MG/3ML) 0.083% nebulizer solution 2.5 mg  2.5 mg Nebulization Q4H PRN Ivor Costa, MD      . ALPRAZolam Duanne Moron) tablet 0.25 mg  0.25 mg Oral TID Ivor Costa, MD   0.25 mg at 03/08/15 0840  . amLODipine (NORVASC) tablet 10 mg  10 mg Oral Daily Janece Canterbury, MD   10 mg at 03/08/15 0839  . aspirin tablet 325 mg  325 mg Oral Daily Ivor Costa, MD   325 mg at 03/08/15 0836  .  ciprofloxacin (CIPRO) IVPB 400 mg  400 mg Intravenous Q12H Jake Church Masters, RPH   400 mg at 03/08/15 1340  . clopidogrel (PLAVIX) tablet 75 mg  75 mg Oral Daily Ivor Costa, MD   75 mg at 03/08/15 0841  . docusate sodium (COLACE) capsule 100 mg  100 mg Oral BID Ivor Costa, MD   100 mg at 03/08/15 0841  . heparin injection 5,000 Units  5,000 Units Subcutaneous 3 times per day Harvel Quale, [facility]   5,000 Units at 03/08/15 1344  . hydrALAZINE (APRESOLINE) injection 10 mg  10 mg Intravenous Q4H PRN Janece Canterbury, MD   10 mg at 03/08/15 0459  . insulin aspart (novoLOG) injection 0-9 Units  0-9 Units  Subcutaneous TID WC Ivor Costa, MD   1 Units at 03/08/15 1222  . isosorbide-hydrALAZINE (BIDIL) 20-37.5 MG per tablet 1 tablet  1 tablet Oral TID Janece Canterbury, MD   1 tablet at 03/08/15 0840  . labetalol (NORMODYNE) tablet 200 mg  200 mg Oral TID Janece Canterbury, MD   200 mg at 03/08/15 0839  . lactulose (CHRONULAC) 10 GM/15ML solution 20 g  20 g Oral BID PRN Ivor Costa, MD      . lisinopril (PRINIVIL,ZESTRIL) tablet 40 mg  40 mg Oral Daily Janece Canterbury, MD   40 mg at 03/08/15 0841  . metroNIDAZOLE (FLAGYL) IVPB 500 mg  500 mg Intravenous Q8H Ivor Costa, MD   500 mg at 03/08/15 0842  . nicotine (NICODERM CQ - dosed in mg/24 hours) patch 21 mg  21 mg Transdermal Daily Ivor Costa, MD   21 mg at 03/08/15 0844  . polyethylene glycol (MIRALAX / GLYCOLAX) packet 17 g  17 g Oral BID Ivor Costa, MD   17 g at 03/08/15 0837  . pravastatin (PRAVACHOL) tablet 80 mg  80 mg Oral q1800 Janece Canterbury, MD   80 mg at 03/07/15 1632  . senna (SENOKOT) tablet 8.6 mg  1 tablet Oral Daily Janece Canterbury, MD   8.6 mg at 03/08/15 0852  . sodium chloride 0.9 % injection 3 mL  3 mL Intravenous Q12H Ivor Costa, MD   3 mL at 03/08/15 0850   Do not use this list as official medication orders. Please verify with discharge summary.  Discharge Medications:   Medication List    ASK your doctor about these medications        ALPRAZolam 0.25 MG tablet  Commonly known as:  XANAX  Take 0.25 mg by mouth 3 (three) times daily.     amLODipine 5 MG tablet  Commonly known as:  NORVASC  Take 5 mg by mouth daily.     aspirin 325 MG tablet  Take 325 mg by mouth daily.     clopidogrel 75 MG tablet  Commonly known as:  PLAVIX  Take 75 mg by mouth daily.     docusate sodium 100 MG capsule  Commonly known as:  COLACE  Take 100 mg by mouth 2 (two) times daily.     ferrous sulfate 325 (65 FE) MG tablet  Take 325 mg by mouth daily with breakfast.     Fish Oil 1000 MG Caps  Take 1,000 mg by mouth 3 (three) times daily.      furosemide 20 MG tablet  Commonly known as:  LASIX  Take 20 mg by mouth daily.     gabapentin 100 MG capsule  Commonly known as:  NEURONTIN  Take 100 mg by mouth 3 (three) times daily.  lactulose 10 GM/15ML solution  Commonly known as:  CHRONULAC  Take 30 mLs by mouth 2 (two) times daily as needed.     lisinopril 20 MG tablet  Commonly known as:  PRINIVIL,ZESTRIL  Take 20 mg by mouth daily.     metFORMIN 500 MG tablet  Commonly known as:  GLUCOPHAGE  Take 500 mg by mouth 2 (two) times daily with a meal.     metoprolol 50 MG tablet  Commonly known as:  LOPRESSOR  Take 50 mg by mouth 2 (two) times daily.     MULTIVITAMIN PO  Take 1 tablet by mouth daily as needed.     OS-CAL 500 + D PO  Take 1 tablet by mouth daily as needed.     polyethylene glycol packet  Commonly known as:  MIRALAX / GLYCOLAX  Take 17 g by mouth daily. In 8 ounces of water     Potassium Chloride ER 20 MEQ Tbcr  Take 1 tablet by mouth daily.     pravastatin 40 MG tablet  Commonly known as:  PRAVACHOL  Take 40 mg by mouth daily.     tamsulosin 0.4 MG Caps capsule  Commonly known as:  FLOMAX  Take 0.4 mg by mouth daily after supper.        Relevant Imaging Results:  Relevant Lab Results:  Recent Labs    Additional Information SS# [id number]  Glendon Axe, MSW, LCSWA [phone] 03/08/2015 4:01 PM

## 2015-03-08 NOTE — Progress Notes (Signed)
Patient's BP 192/59, gave '10mg'$  of hydralazine per prn order. No distress or pain, call light within reach.

## 2015-03-08 NOTE — Clinical Social Work Placement (Signed)
   CLINICAL SOCIAL WORK PLACEMENT  NOTE  Date:  03/08/2015  Patient Details  Name: [name] MRN: [id number] Date of Birth: 26-Apr-1935  Clinical Social Work is seeking post-discharge placement for this patient at the [place] level of care (*CSW will initial, date and re-position this form in  chart as items are completed):  Yes   Patient/family provided with [place] Work Department's list of facilities offering this level of care within the geographic area requested by the patient (or if unable, by the patient's family).  Yes   Patient/family informed of their freedom to choose among providers that offer the needed level of care, that participate in Medicare, Medicaid or managed care program needed by the patient, have an available bed and are willing to accept the patient.  Yes   Patient/family informed of 's ownership interest in [facility] and [facility], as well as of the fact that they are under no obligation to receive care at these facilities.  PASRR submitted to EDS on       PASRR number received on       Existing PASRR number confirmed on 03/08/15     FL2 transmitted to all facilities in geographic area requested by pt/family on 03/08/15     FL2 transmitted to all facilities within larger geographic area on       Patient informed that his/her managed care company has contracts with or will negotiate with certain facilities, including the following:            Patient/family informed of bed offers received.  Patient chooses bed at       Physician recommends and patient chooses bed at      Patient to be transferred to   on  .  Patient to be transferred to facility by       Patient family notified on   of transfer.  Name of family member notified:        PHYSICIAN Please sign FL2     Additional Comment:    _______________________________________________ Glendon Axe, MSW, [place] [phone] 03/08/2015 3:48 PM

## 2015-03-08 NOTE — Clinical Social Work Note (Signed)
Clinical Social Work Assessment  Patient Details  Name: [name] MRN: [id number] Date of Birth: [birth year]  Date of referral:  03/08/15               Reason for consult:  Discharge Planning, Facility Placement                Permission sought to share information with:  Family Supports, Customer service manager, Case Optician, dispensing granted to share information::  Yes, Verbal Permission Granted  Name::      Darlen Round and Ronald Lobo)  Agency::   (SNF's )  Relationship::   (Daughter and Granddaughter )  Sport and exercise psychologist Information:   [phone])  Housing/Transportation Living arrangements for the past 2 months:  [place] of Information:  Adult Children Patient Interpreter Needed:  None Criminal Activity/Legal Involvement Pertinent to Current Situation/Hospitalization:  No - Comment as needed Significant Relationships:  Adult Children, Other Family Members Lives with:  Adult Children Do you feel safe going back to the place where you live?  No Need for family participation in patient care:  No (Coment)  Care giving concerns:  Patient requiring short-term rehab placement at SNF.    Social Worker assessment / plan:  Holiday representative met with patient at bedside and contacted patient's dtr, Enid Derry and granddaughter, Altamese Dilling in reference to post-acute placement for SNF. CSW introduced CSW role and SNF process. Pt's family familiar with SNF process. Pt's granddaughter, Altamese Dilling reported that patient has been at [facility] before in the past. Pt's family are open to other SNF's and awaits bed offers. Documentation reported that patient is alert and oriented x4, however patient seemed confused during assessment. No further concerns reported at this time. CSW remains available as needed.   Employment status:  Retired Nurse, adult PT Recommendations:  [place] / Referral to  community resources:  [place]  Patient/Family's Response to care:  Patient sitting at bedside, confused. Pt's family agreeable to SNF placement and plans to choose from bed offers once available. Pt's family pleasant and appreciated social work intervention.   Patient/Family's Understanding of and Emotional Response to Diagnosis, Current Treatment, and Prognosis:  Pt's family knowledgeable of medical intervention and supportive of pt's care.   Emotional Assessment Appearance:  Appears older than stated age Attitude/Demeanor/Rapport:  Unable to Assess Affect (typically observed):  Unable to Assess Orientation:  Oriented to Self, Oriented to Place Alcohol / Substance use:  Not Applicable Psych involvement (Current and /or in the community):  No (Comment)  Discharge Needs  Concerns to be addressed:  Care Coordination Readmission within the last 30 days:  No Current discharge risk:  Dependent with Mobility Barriers to Discharge:  Continued Medical Work up   [employer], MSW, LCSWA [phone] 03/08/2015 3:48 PM

## 2015-03-08 NOTE — Progress Notes (Signed)
[facility] PROGRESS NOTE  [name] XVQ:[id number] DOB: [birth year] DOA: 03/05/2015 PCP: No primary care provider on file.  Brief Summary  [name] is a 80 y.o. male with PMH of hypertension, hyperlipidemia, diabetes mellitus, COPD, anxiety, tobacco abuse, CAD, S/P stent placement, and in-stent restenosis, PVD, left eye blindness due to glaucoma, who presented with a 1-week history of abdominal pain, constipation, and progressive weakness.  His abdominal pain was located in the lower abdomen, constant, 5 out of 10 in severity, nonradiating. He denied fever, chills, symptoms of UTI. He has left facial droop, and left sided weakness due to previous strokes. Patient denied chest pain, shortness of breath, cough.  In ED, blood pressure 206/77, lactate 1.44-->2.14, troponin 0.97, negative urinalysis, lipase 26, WBC 12.5, temperature normal, no tachycardia, AKI, negative chest x-ray. CT-head showed no acute intracranial abnormalities. MRI of brain showed remote infarction. CT abdomen/pelvis showed stercoral colitis.  Assessment/Plan  Abdominal pain and possible sepsis:  Abdominal pain likely related to his severe constipation and probable ischemic colitis secondary to his constipation.  Abdominal pain continuing to improve and having BMs.   -  Continue miralax BID and colace -  Add senna -  Continue ciprofloxacin and flagyl for colitis day 4 -  BCx NGTD  Hypertensive emergency: Blood pressure initially 206/77. Patient has worsening renal function, elevated troponin consistent with hypertensive emergency.  Blood pressures remain elevated -  Increase amlodipine and lisinopril -  D/c metoprolol and start labetalol  -  Increase hydralazine and imdur (and change to bidil) -  troponins flat  Sinus bradycardia, asymptomatic -  Okay to d/c telemetry -  Monitor HR while changing from metop to labetalol  CAD and elevated trop: Troponin 0.97. ED physician called cardiology, Dr. Marlou Porch, who  did not think this is ACS. This is likely due to demanding ischemia secondary to hypertensive emergency -  Peak troponin 0.97, but fluctuated back and forth between 0.64 and 0.9 -  Titrate imdur -  Continue BB, aspirin and pravastatin and plavix -  LDL above goal at 81 -  A1c 6.2 - 2d echo:  Severe LVH with grade 1 DD and probable biscupid AV  COPD (chronic obstructive pulmonary disease) ([place]): stable -Albuterol when necessary  HLD: Last LDL was 46 on 11/23/07, currently 81 - increased Pravastatin  Tobacco abuse and Alcohol abuse: -Did counseling about importance of quitting smoking -Nicotine patch  AKI: Likely due to prerenal secondary to dehydration and continuation of ACEI. No hydronephrosis on CT abdomen/pelvis.  Creatinine continuing to trend down  DM-II: Last A1c 8.6 on 07/17/07, poorly controled. Patient is taking metformin at home -SSI -6.2  Generalized weakness superimposed on previous stroke with persistent worsening of the left facial droop and slurred speech.  Patient and daughter are worried.  Explained that high blood pressure, infection, and stress can cause old stroke symptoms to worsen.  Does have some forehead involvement and facial symptoms have persisted despite improvement in other medical problems.   -  PT recommending SNF -  Neurology:  Feels this is worsening of previous stroke symptoms secondary to acute illness  Diet:  diabetic Access:  PIV IVF:  off Proph:  lovenox  Code Status: full Family Communication: patient alone Disposition Plan:  To SNF once blood pressure improved   Consultants:  Dr. Candee Furbish, Cardiology, via phone  Dr. Leonel Ramsay, Neurology  Procedures:  CT abd/pelvis  MRI brain  CT head  Antibiotics:  Ciprofloxacin 11/3  Flagyl 11/3   HPI/Subjective:  Left facial droop and slurred speech marginally improved today.  Still having some abdominal discomfort.  Last BM was yesterday morning, none since.     Objective: Filed Vitals:   03/07/15 1632 03/07/15 1809 03/07/15 2011 03/08/15 0439  BP: 153/63 170/62 168/64 192/59  Pulse:  48 50 56  Temp:  97.7 F (36.5 C) 97.6 F (36.4 C) 98 F (36.7 C)  TempSrc:  Oral Oral Oral  Resp: '16 17 17 18  '$ Height:      Weight:      SpO2: 98% 100% 99% 100%    Intake/Output Summary (Last 24 hours) at 03/08/15 0823 Last data filed at 03/08/15 0442  Gross per 24 hour  Intake   1483 ml  Output   2700 ml  Net  -1217 ml   Filed Weights   03/05/15 1345  Weight: 72.576 kg (160 lb)   Body mass index is 23.62 kg/(m^2).  Exam:   General:  Adult male, No acute distress, blind with left facial droop  HEENT:  NCAT, MMM  Cardiovascular:  RRR, nl S1, S2, 2+ pulses, warm extremities  Respiratory:  CTAB, no increased WOB  Abdomen:   NABS, soft, ND, mildly TTP diffusely without rebound or guarding  MSK:   Normal tone and bulk, no LEE  Neuro:   Left facial droop    Data Reviewed: Basic Metabolic Panel:  Recent Labs Lab 03/05/15 1448 03/06/15 0327 03/07/15 0407 03/08/15 0259  NA 138 141 142 143  K 4.2 4.0 4.1 3.8  CL 103 105 107 108  CO2 20* '26 26 27  '$ GLUCOSE 191* 125* 90 139*  BUN '13 12 8 7  '$ CREATININE 1.38* 1.00 0.86 0.85  CALCIUM 9.8 9.2 8.8* 8.9   Liver Function Tests:  Recent Labs Lab 03/05/15 1448  AST 41  ALT 21  ALKPHOS 94  BILITOT 0.7  PROT 8.6*  ALBUMIN 4.0    Recent Labs Lab 03/05/15 1448  LIPASE 26   No results for input(s): AMMONIA in the last 168 hours. CBC:  Recent Labs Lab 03/05/15 1448 03/06/15 0327 03/07/15 0407 03/08/15 0259  WBC 12.5* 10.8* 7.8 7.2  NEUTROABS 11.1*  --   --   --   HGB 15.4 14.6 12.5* 12.2*  HCT 45.4 42.4 37.9* 37.2*  MCV 84.9 86.0 88.3 87.7  PLT 258 241 231 241    Recent Results (from the past 240 hour(s))  MRSA PCR Screening     Status: None   Collection Time: 03/06/15  2:40 AM  Result Value Ref Range Status   MRSA by PCR NEGATIVE NEGATIVE Final    Comment:         The GeneXpert MRSA Assay (FDA approved for NASAL specimens only), is one component of a comprehensive MRSA colonization surveillance program. It is not intended to diagnose MRSA infection nor to guide or monitor treatment for MRSA infections.      Studies: No results found.  Scheduled Meds: . ALPRAZolam  0.25 mg Oral TID  . amLODipine  10 mg Oral Daily  . aspirin  325 mg Oral Daily  . ciprofloxacin  400 mg Intravenous Q12H  . clopidogrel  75 mg Oral Daily  . docusate sodium  100 mg Oral BID  . heparin  5,000 Units Subcutaneous 3 times per day  . insulin aspart  0-9 Units Subcutaneous TID WC  . isosorbide-hydrALAZINE  1 tablet Oral TID  . labetalol  200 mg Oral TID  . lisinopril  40 mg Oral Daily  .  metronidazole  500 mg Intravenous Q8H  . nicotine  21 mg Transdermal Daily  . polyethylene glycol  17 g Oral BID  . pravastatin  80 mg Oral q1800  . sodium chloride  3 mL Intravenous Q12H   Continuous Infusions:    Principal Problem:   Abdominal pain Active Problems:   COPD (chronic obstructive pulmonary disease) (H[facility])   HLD (hyperlipidemia)   NONDEPENDENT TOBACCO USE DISORDER   Coronary atherosclerosis   PVD   Hypertensive emergency   Constipation   Sepsis ([place])   Anxiety   AKI (acute kidney injury) ([place])   Elevated troponin   Diabetes mellitus without complication ([place])   Colitis, ischemic ([place])   Facial droop    Time spent: 30 min    Ona Roehrs, [place] [facility] Pager [phone]. If 7PM-7AM, please contact night-coverage at [email or web address], password [facility] 03/08/2015, 8:23 AM  LOS: 2 days

## 2015-03-09 DIAGNOSIS — R1310 Dysphagia, unspecified: Secondary | ICD-10-CM

## 2015-03-09 LAB — CBC
HCT: 34.1 % — ABNORMAL LOW (ref 39.0–52.0)
HEMOGLOBIN: 11.1 g/dL — AB (ref 13.0–17.0)
MCH: 28.4 pg (ref 26.0–34.0)
MCHC: 32.6 g/dL (ref 30.0–36.0)
MCV: 87.2 fL (ref 78.0–100.0)
PLATELETS: 230 10*3/uL (ref 150–400)
RBC: 3.91 MIL/uL — AB (ref 4.22–5.81)
RDW: 14.1 % (ref 11.5–15.5)
WBC: 5.6 10*3/uL (ref 4.0–10.5)

## 2015-03-09 LAB — BASIC METABOLIC PANEL
ANION GAP: 9 (ref 5–15)
BUN: 7 mg/dL (ref 6–20)
CALCIUM: 8.9 mg/dL (ref 8.9–10.3)
CHLORIDE: 105 mmol/L (ref 101–111)
CO2: 26 mmol/L (ref 22–32)
CREATININE: 1.08 mg/dL (ref 0.61–1.24)
GFR calc non Af Amer: >60 mL/min (ref >60)
Glucose, Bld: 160 mg/dL — ABNORMAL HIGH (ref 65–99)
Potassium: 3.4 mmol/L — ABNORMAL LOW (ref 3.5–5.1)
SODIUM: 140 mmol/L (ref 135–145)

## 2015-03-09 LAB — GLUCOSE, CAPILLARY
GLUCOSE-CAPILLARY: 130 mg/dL — AB (ref 65–99)
GLUCOSE-CAPILLARY: 125 mg/dL — AB (ref 65–99)
GLUCOSE-CAPILLARY: 131 mg/dL — AB (ref 65–99)
GLUCOSE-CAPILLARY: 125 mg/dL — AB (ref 65–99)

## 2015-03-09 MED ORDER — FUROSEMIDE 20 MG PO TABS
20.0000 mg | ORAL_TABLET | Freq: Every day | ORAL | Status: DC
  Administered 2015-03-10: 20 mg via ORAL
  Filled 2015-03-09: qty 1

## 2015-03-09 MED ORDER — AMLODIPINE BESYLATE 5 MG PO TABS
5.0000 mg | ORAL_TABLET | Freq: Every day | ORAL | Status: DC
  Administered 2015-03-10: 5 mg via ORAL
  Filled 2015-03-09: qty 1

## 2015-03-09 MED ORDER — LISINOPRIL 10 MG PO TABS
20.0000 mg | ORAL_TABLET | Freq: Every day | ORAL | Status: DC
  Administered 2015-03-10: 20 mg via ORAL
  Filled 2015-03-09: qty 2

## 2015-03-09 MED ORDER — POTASSIUM CHLORIDE CRYS ER 20 MEQ PO TBCR
40.0000 meq | EXTENDED_RELEASE_TABLET | Freq: Once | ORAL | Status: AC
  Administered 2015-03-09: 40 meq via ORAL
  Filled 2015-03-09: qty 2

## 2015-03-09 MED ORDER — RESOURCE THICKENUP CLEAR PO POWD
ORAL | Status: DC | PRN
  Filled 2015-03-09: qty 125

## 2015-03-09 NOTE — Progress Notes (Signed)
RN administered scheduled medications crushed with apple sauce per patient and day shift RN. RN noticed patient had a difficult time swallowing and began coughing. No signs of airway obstruction, pt able to respond to RN in complete sentences, VSS. RN will keep patient NPO tonight and pass on to oncoming RN. Continue to monitor.   Sharene Skeans, RN

## 2015-03-09 NOTE — Progress Notes (Addendum)
[facility] PROGRESS NOTE  ERICK MURIN JJO:[id number] DOB: 1935/03/26 DOA: 03/05/2015 PCP: No primary care provider on file.  Brief Summary  [name] is a 80 y.o. male with PMH of hypertension, hyperlipidemia, diabetes mellitus, COPD, anxiety, tobacco abuse, CAD, S/P stent placement, and in-stent restenosis, PVD, left eye blindness due to glaucoma, who presented with a 1-week history of abdominal pain, constipation, and progressive weakness.  His abdominal pain was located in the lower abdomen, constant, 5 out of 10 in severity, nonradiating. He denied fever, chills, symptoms of UTI. He has left facial droop, and left sided weakness due to previous strokes. Patient denied chest pain, shortness of breath, cough.  In ED, blood pressure 206/77, lactate 1.44-->2.14, troponin 0.97, negative urinalysis, lipase 26, WBC 12.5, temperature normal, no tachycardia, AKI, negative chest x-ray. CT-head showed no acute intracranial abnormalities. MRI of brain showed remote infarction. CT abdomen/pelvis showed stercoral colitis.  Assessment/Plan  Abdominal pain and possible sepsis:  Abdominal pain likely related to his severe constipation and probable ischemic colitis secondary to his constipation.  Abdominal pain continuing to improve and having BMs.   -  Continue miralax BID and colace with senna -  Continue ciprofloxacin and flagyl for colitis day 5 -  BCx NGTD  Hypertensive emergency: Blood pressure initially 206/77. Patient has worsening renal function, elevated troponin consistent with hypertensive emergency.  Blood pressures well controlled to slightly low -  Decrease amlodipine and lisinopril -  Continue labetalol  -  Continue bidil -  troponins flat  Sinus bradycardia, asymptomatic -  Monitor HR while changing from metop to labetalol  CAD and elevated trop: Troponin 0.97. ED physician called cardiology, Dr. Marlou Porch, who did not think this is ACS. This is likely due to demanding ischemia  secondary to hypertensive emergency -  Peak troponin 0.97, but fluctuated back and forth between 0.64 and 0.9 -  Titrate imdur -  Continue BB, aspirin and pravastatin and plavix -  LDL above goal at 81 -  A1c 6.2 - 2d echo:  Severe LVH with grade 1 DD and probable biscupid AV  COPD (chronic obstructive pulmonary disease) ([place]): stable -Albuterol when necessary  HLD: Last LDL was 46 on 11/23/07, currently 81 - increased Pravastatin  Tobacco abuse and Alcohol abuse: -Did counseling about importance of quitting smoking -Nicotine patch  AKI: Likely due to prerenal secondary to dehydration and continuation of ACEI. No hydronephrosis on CT abdomen/pelvis.  Creatinine continuing to trend down  DM-II: Last A1c 8.6 on 07/17/07, poorly controled. Patient is taking metformin at home -SSI -6.2  Generalized weakness superimposed on previous stroke with persistent worsening of the left facial droop and slurred speech.  Patient and daughter are worried.  Explained that high blood pressure, infection, and stress can cause old stroke symptoms to worsen.  Does have some forehead involvement and facial symptoms have persisted despite improvement in other medical problems.   -  PT recommending SNF -  Neurology:  Feels this is worsening of previous stroke symptoms secondary to acute illness  Possible dysphagia, choking episode witnessed by RN last night -  SLP consultation  -  Change to dysphagia 1 pending SLP evaluation  Diet:  diabetic Access:  PIV IVF:  off Proph:  lovenox  Code Status: full Family Communication: patient alone Disposition Plan:  To SNF likely tomorrow   Consultants:  Dr. Candee Furbish, Cardiology, via phone  Dr. Leonel Ramsay, Neurology  Procedures:  CT abd/pelvis  MRI brain  CT head  Antibiotics:  Ciprofloxacin  11/3  Flagyl 11/3   HPI/Subjective:  Still not pooping regularly, but abdomen continues to improve.  Face droop and speech seem to be closer to  baseline   Objective: Filed Vitals:   03/08/15 2039 03/09/15 0517 03/09/15 1000 03/09/15 1314  BP: 121/48 134/46 132/50 117/49  Pulse: 65 66  72  Temp: 98.1 F (36.7 C) 98 F (36.7 C)  98.4 F (36.9 C)  TempSrc: Oral Oral  Oral  Resp: '18 16  18  '$ Height:      Weight:      SpO2: 96% 98%  98%    Intake/Output Summary (Last 24 hours) at 03/09/15 1440 Last data filed at 03/09/15 1315  Gross per 24 hour  Intake    600 ml  Output   1200 ml  Net   -600 ml   Filed Weights   03/05/15 1345  Weight: 72.576 kg (160 lb)   Body mass index is 23.62 kg/(m^2).  Exam:   General:  Adult male, No acute distress, blind with left facial droop  HEENT:  NCAT, MMM  Cardiovascular:  RRR, nl S1, S2, + gallop, 2+ pulses, warm extremities  Respiratory:  CTAB, no increased WOB  Abdomen:   NABS, soft, ND, NT  MSK:   Normal tone and bulk, no LEE  Neuro:   Left facial droop    Data Reviewed: Basic Metabolic Panel:  Recent Labs Lab 03/05/15 1448 03/06/15 0327 03/07/15 0407 03/08/15 0259 03/09/15 0159  NA 138 141 142 143 140  K 4.2 4.0 4.1 3.8 3.4*  CL 103 105 107 108 105  CO2 20* '26 26 27 26  '$ GLUCOSE 191* 125* 90 139* 160*  BUN '13 12 8 7 7  '$ CREATININE 1.38* 1.00 0.86 0.85 1.08  CALCIUM 9.8 9.2 8.8* 8.9 8.9   Liver Function Tests:  Recent Labs Lab 03/05/15 1448  AST 41  ALT 21  ALKPHOS 94  BILITOT 0.7  PROT 8.6*  ALBUMIN 4.0    Recent Labs Lab 03/05/15 1448  LIPASE 26   No results for input(s): AMMONIA in the last 168 hours. CBC:  Recent Labs Lab 03/05/15 1448 03/06/15 0327 03/07/15 0407 03/08/15 0259 03/09/15 0159  WBC 12.5* 10.8* 7.8 7.2 5.6  NEUTROABS 11.1*  --   --   --   --   HGB 15.4 14.6 12.5* 12.2* 11.1*  HCT 45.4 42.4 37.9* 37.2* 34.1*  MCV 84.9 86.0 88.3 87.7 87.2  PLT 258 241 231 241 230    Recent Results (from the past 240 hour(s))  MRSA PCR Screening     Status: None   Collection Time: 03/06/15  2:40 AM  Result Value Ref Range Status    MRSA by PCR NEGATIVE NEGATIVE Final    Comment:        The GeneXpert MRSA Assay (FDA approved for NASAL specimens only), is one component of a comprehensive MRSA colonization surveillance program. It is not intended to diagnose MRSA infection nor to guide or monitor treatment for MRSA infections.      Studies: No results found.  Scheduled Meds: . ALPRAZolam  0.25 mg Oral TID  . amLODipine  10 mg Oral Daily  . aspirin  325 mg Oral Daily  . ciprofloxacin  400 mg Intravenous Q12H  . clopidogrel  75 mg Oral Daily  . docusate sodium  100 mg Oral BID  . heparin  5,000 Units Subcutaneous 3 times per day  . insulin aspart  0-9 Units Subcutaneous TID WC  . isosorbide-hydrALAZINE  1 tablet Oral TID  . labetalol  200 mg Oral TID  . lisinopril  40 mg Oral Daily  . metronidazole  500 mg Intravenous Q8H  . nicotine  21 mg Transdermal Daily  . polyethylene glycol  17 g Oral BID  . pravastatin  80 mg Oral q1800  . senna  1 tablet Oral Daily  . sodium chloride  3 mL Intravenous Q12H   Continuous Infusions:    Principal Problem:   Abdominal pain Active Problems:   COPD (chronic obstructive pulmonary disease) ([facility])   HLD (hyperlipidemia)   NONDEPENDENT TOBACCO USE DISORDER   Coronary atherosclerosis   PVD   Hypertensive emergency   Constipation   Sepsis ([place])   Anxiety   AKI (acute kidney injury) ([place])   Elevated troponin   Diabetes mellitus without complication ([place])   Colitis, ischemic ([place])   Facial droop    Time spent: 30 min    Roderick Calo, [facility] Pager [phone]. If 7PM-7AM, please contact night-coverage at [email or web address], password [facility] 03/09/2015, 2:40 PM  LOS: 3 days

## 2015-03-09 NOTE — Evaluation (Signed)
Clinical/Bedside Swallow Evaluation Patient Details  Name: [name] MRN: [id number] Date of Birth: 1935/04/19  Today's Date: 03/09/2015 Time: SLP Start Time (ACUTE ONLY): 20 SLP Stop Time (ACUTE ONLY): 1552 SLP Time Calculation (min) (ACUTE ONLY): 22 min  Past Medical History:  Past Medical History  Diagnosis Date  . CAD (coronary artery disease)     multi vessel. s/p recent bare metal stenting of high grade proximal R CA stenosis. residual 70% : main in-stent restenosis, with distal filling of the left anterior descending from graft flow. patent bypass graft, except for 100% occlusion of saphernous vein graft - 1st diagonal branch. 3-vessel CABG 2000.  Marland Kitchen CAD (coronary artery disease)     s/p of 95% L main artery stenosis, 4/03: cutting balloon PTCA of 80% in-stent restenosis, 7/04. preserved L ventriculr function  . Carotid bruit     bilateral. no sigificant distal abd atherosclerosis by recent cath.   Marland Kitchen COPD (chronic obstructive pulmonary disease) ([facility])     ongoing tobacco   . DM2 (diabetes mellitus, type 2) ([place])   . Dyslipidemia   . HTN (hypertension)    Past Surgical History:  Past Surgical History  Procedure Laterality Date  . Cardiac catheterization     HPI:  80 y.o. male with PMH of hypertension, hyperlipidemia, diabetes mellitus, COPD, anxiety, tobacco abuse, CAD, S/P stent placement, and in-stent restenosis, PVD, left eye blindness due to glaucoma, who presents with abdominal pain, constipation or weakness; CXR 03/05/15 indicated no active disease; MRI of brain showed remote, stable infarcts in right cerebellum, left>right basal ganglia and brainstem, but no acute intracranial abnormalities; pt exhibited left facial droop/weakness d/t previous CVA's.   Assessment / Plan / Recommendation Clinical Impression   Pt with s/s of aspiration including wet vocal quality after intake with delayed throat clearing with thin liquids via cup; nectar-thick liquids eliminated throat  clearing; pt c/o "strangling with water" prior to SLP assessment and placed on nectar-thickened liquids prior to BSE by nursing; oral holding, decreased anterior to posterior transit with all po intake and lateral sulci pocketing noted (with solids only); compensatory strategy of lingual buccal clearance on left recommended during meals/snacks to decrease left residue; recommend Dysphagia 2/nectar-thickened liquids initially, with ST f/u for diet tolerance and possible upgrade of diet/liquids.    Aspiration Risk  MIld-Moderate    Diet Recommendation Dysphagia 2 (Fine chop);Nectar   Medication Administration: Crushed with puree Compensations: Slow rate;Small sips/bites    Other  Recommendations Oral Care Recommendations: Oral care BID Other Recommendations: Order thickener from pharmacy   Follow Up Recommendations       Frequency and Duration min 2x/week  2 weeks   Pertinent Vitals/Pain WDL    SLP Swallow Goals  See POC   Swallow Study Prior Functional Status   Modified independent; lives with daughter    General Date of Onset: 03/05/15 Other Pertinent Information: 80 y.o. 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 Type of Study: Bedside swallow evaluation Diet Prior to this Study: Dysphagia 1 (puree);Nectar-thick liquids Temperature Spikes Noted: No Respiratory Status: Room air History of Recent Intubation: No Oral Cavity - Dentition: Edentulous Self-Feeding Abilities: Needs assist Patient Positioning: Upright in bed Baseline Vocal Quality: Hoarse;Low vocal intensity Volitional Cough: Weak Volitional Swallow: Able to elicit    Oral/Motor/Sensory Function Overall Oral Motor/Sensory Function: Impaired at baseline Labial ROM:  Reduced left Labial Symmetry: Abnormal symmetry  left Labial Strength: Reduced Labial Sensation: Reduced Lingual Symmetry: Abnormal symmetry left Lingual Strength: Reduced Lingual Sensation: Reduced Facial ROM: Reduced left Facial Symmetry: Left droop;Left drooping eyelid Facial Strength: Reduced Facial Sensation: Reduced   Ice Chips Ice chips: Within functional limits Presentation: Spoon   Thin Liquid Thin Liquid: Impaired Presentation: Cup;Spoon Oral Phase Impairments: Reduced labial seal Oral Phase Functional Implications: Left anterior spillage Pharyngeal  Phase Impairments: Wet Vocal Quality    Nectar Thick Nectar Thick Liquid: Impaired Presentation: Cup;Spoon Oral Phase Impairments: Reduced labial seal   Honey Thick Honey Thick Liquid: Not tested   Puree Puree: Impaired Presentation: Spoon Oral Phase Impairments: Impaired anterior to posterior transit Oral Phase Functional Implications: Prolonged oral transit Pharyngeal Phase Impairments: Multiple swallows   Solid       Solid: Impaired Presentation: Spoon Oral Phase Impairments: Impaired anterior to posterior transit Oral Phase Functional Implications: Left lateral sulci pocketing;Oral residue Pharyngeal Phase Impairments: Multiple swallows       ADAMS,PAT, M.S., CCC-SLP 03/09/2015,4:17 PM

## 2015-03-09 NOTE — Care Management Important Message (Signed)
Important Message  Patient Details  Name: [name] MRN: [id number] Date of Birth: 1935-01-21   Medicare Important Message Given:  Yes-second notification given    Nathen May 03/09/2015, 10:09 AM

## 2015-03-10 ENCOUNTER — Inpatient Hospital Stay
Admission: RE | Admit: 2015-03-10 | Discharge: 2015-03-15 | Disposition: A | Discharge status: Nursing Home (Includes ICF) | Payer: Medicare Other | Source: Ambulatory Visit | Attending: Internal Medicine | Admitting: Internal Medicine

## 2015-03-10 DIAGNOSIS — R1310 Dysphagia, unspecified: Secondary | ICD-10-CM

## 2015-03-10 LAB — BASIC METABOLIC PANEL
ANION GAP: 8 (ref 5–15)
BUN: 7 mg/dL (ref 6–20)
CALCIUM: 8.8 mg/dL — AB (ref 8.9–10.3)
CO2: 24 mmol/L (ref 22–32)
Chloride: 109 mmol/L (ref 101–111)
Creatinine, Ser: 0.98 mg/dL (ref 0.61–1.24)
GFR calc Af Amer: >60 mL/min (ref >60)
GLUCOSE: 150 mg/dL — AB (ref 65–99)
Potassium: 3.8 mmol/L (ref 3.5–5.1)
SODIUM: 141 mmol/L (ref 135–145)

## 2015-03-10 LAB — CBC
HCT: 33.9 % — ABNORMAL LOW (ref 39.0–52.0)
Hemoglobin: 10.9 g/dL — ABNORMAL LOW (ref 13.0–17.0)
MCH: 28.2 pg (ref 26.0–34.0)
MCHC: 32.2 g/dL (ref 30.0–36.0)
MCV: 87.6 fL (ref 78.0–100.0)
Platelets: 243 10*3/uL (ref 150–400)
RBC: 3.87 MIL/uL — ABNORMAL LOW (ref 4.22–5.81)
RDW: 14.5 % (ref 11.5–15.5)
WBC: 9.2 10*3/uL (ref 4.0–10.5)

## 2015-03-10 LAB — GLUCOSE, CAPILLARY
GLUCOSE-CAPILLARY: 109 mg/dL — AB (ref 65–99)
GLUCOSE-CAPILLARY: 149 mg/dL — AB (ref 65–99)
Glucose-Capillary: 155 mg/dL — ABNORMAL HIGH (ref 65–99)

## 2015-03-10 MED ORDER — ISOSORB DINITRATE-HYDRALAZINE 20-37.5 MG PO TABS: 1.0000 | ORAL_TABLET | Freq: Three times a day (TID) | ORAL | Status: DC

## 2015-03-10 MED ORDER — BISACODYL 10 MG RE SUPP
10.0000 mg | Freq: Once | RECTAL | Status: AC
  Administered 2015-03-10: 10 mg via RECTAL
  Filled 2015-03-10: qty 1

## 2015-03-10 MED ORDER — CIPROFLOXACIN HCL 500 MG PO TABS: 500.0000 mg | ORAL_TABLET | Freq: Two times a day (BID) | ORAL | Status: DC

## 2015-03-10 MED ORDER — POLYETHYLENE GLYCOL 3350 17 GM/SCOOP PO POWD: 17.0000 g | Freq: Two times a day (BID) | ORAL | Status: DC

## 2015-03-10 MED ORDER — BISACODYL 10 MG RE SUPP: 10.0000 mg | Freq: Every day | RECTAL | Status: DC | PRN

## 2015-03-10 MED ORDER — SENNA 8.6 MG PO TABS
2.0000 | ORAL_TABLET | Freq: Every day | ORAL | Status: DC
  Administered 2015-03-10: 17.2 mg via ORAL
  Filled 2015-03-10: qty 2

## 2015-03-10 MED ORDER — RESOURCE THICKENUP CLEAR PO POWD: ORAL | Status: DC

## 2015-03-10 MED ORDER — PRAVASTATIN SODIUM 80 MG PO TABS: 80.0000 mg | ORAL_TABLET | Freq: Every day | ORAL | Status: DC

## 2015-03-10 MED ORDER — SENNA 8.6 MG PO TABS: 2.0000 | ORAL_TABLET | Freq: Every day | ORAL | Status: AC

## 2015-03-10 MED ORDER — LABETALOL HCL 200 MG PO TABS: 200.0000 mg | ORAL_TABLET | Freq: Three times a day (TID) | ORAL | Status: DC

## 2015-03-10 MED ORDER — ALPRAZOLAM 0.25 MG PO TABS: 0.2500 mg | ORAL_TABLET | Freq: Three times a day (TID) | ORAL | Status: DC

## 2015-03-10 MED ORDER — METRONIDAZOLE 500 MG PO TABS: 500.0000 mg | ORAL_TABLET | Freq: Three times a day (TID) | ORAL | Status: DC

## 2015-03-10 MED ORDER — NICOTINE 14 MG/24HR TD PT24: 14.0000 mg | MEDICATED_PATCH | Freq: Every day | TRANSDERMAL | Status: DC

## 2015-03-10 NOTE — Progress Notes (Signed)
Speech Language Pathology Treatment: Dysphagia  Patient Details Name: [name] MRN: [id number] DOB: Sep 10, 1934 Today's Date: 03/10/2015 Time: 1140-1150 SLP Time Calculation (min) (ACUTE ONLY): 10 min  Assessment / Plan / Recommendation Clinical Impression  Skilled treatment session focused on addressing dysphagia goals.  SLP facilitated session with trials of water via cup with Mod verbal cues for portion control and pacing, "small sip, swallow quick."  Patient demonstrated no overt s/s of aspiration with trials; however, given amount of cuing and pending discharge recommend to continue with current diet orders (Dys.2 textures and nectar-thick liquids) and recommend initiation of the [employer] Protocol at the next level of care.    Patient was also observed consuming Dys.2 textures and nectar-thick liquids via cup with large portions an Min cues for management of left oral cavity residue with no overt s/s of aspiration.  Patient ready for discharge with therapy to be continued at SNF.    HPI Other Pertinent Information: 80 y.o. 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   Pertinent Vitals Pain Assessment: No/denies pain  SLP Plan  Goals updated    Recommendations Diet recommendations: Dysphagia 2 (fine chop);Nectar-thick liquid Liquids provided via: Cup;No straw Medication Administration: Crushed with puree Supervision: Patient able to self feed;Full supervision/cueing for compensatory strategies Compensations: Slow rate;Small sips/bites;Check for pocketing Postural Changes and/or Swallow Maneuvers: Seated upright 90 degrees              Oral Care Recommendations: Oral care BID Follow up Recommendations: Skilled Nursing facility;24 hour supervision/assistance Plan: Goals  updated    GO    Carmelia Roller., CCC-SLP [phone]  [place] 03/10/2015, 1:22 PM

## 2015-03-10 NOTE — Discharge Summary (Signed)
Physician Discharge Summary  [name] LKG:[id number] DOB: 07-Oct-1934 DOA: 03/05/2015  PCP: No primary care provider on file.  Admit date: 03/05/2015 Discharge date: 03/10/2015  Recommendations for Outpatient Follow-up:  1. Transfer to SNF for ongoing PT/OT and speech therapy.  Anticipate that he could advance his diet over the next few days to week depending on progression 2. Repeat CBC in 2 weeks to follow up anemia.  Please check TSH after episode of acute illness is over to determine if hypothyroidism may be contributing to constipation.   3. F/u with cardiology in 1 month or sooner as needed. 4. Continue antibiotics through 11/9, then stop  Discharge Diagnoses:  Principal Problem:   Colitis, ischemic ([place]) Active Problems:   COPD (chronic obstructive pulmonary disease) (HCC)   HLD (hyperlipidemia)   NONDEPENDENT TOBACCO USE DISORDER   Coronary atherosclerosis   PVD   Hypertensive emergency   Constipation   Abdominal pain   Sepsis ([place])   Anxiety   AKI (acute kidney injury) ([place])   Elevated troponin   Diabetes mellitus without complication (HCC)   Facial droop   Dysphagia   Discharge Condition: stable, improved  Diet recommendation: diabetic, dysphagia 2 with nectar thick liquids  Wt Readings from Last 3 Encounters:  03/05/15 72.576 kg (160 lb)    History of present illness:  [name] is a 80 y.o. male with PMH of hypertension, hyperlipidemia, diabetes mellitus, COPD, anxiety, tobacco abuse, CAD, S/P stent placement, and in-stent restenosis, PVD, left eye blindness due to glaucoma, who presented with a 1-week history of abdominal pain, constipation, and progressive weakness. His abdominal pain was located in the lower abdomen, constant, 5 out of 10 in severity, nonradiating. He denied fever, chills, symptoms of UTI. He has left facial droop, and left sided weakness due to previous strokes. Patient denied chest pain, shortness of breath, cough. In ED, blood  pressure 206/77, lactate 1.44-->2.14, troponin 0.97, negative urinalysis, lipase 26, WBC 12.5, temperature normal, no tachycardia, AKI, negative chest x-ray. CT-head showed no acute intracranial abnormalities. MRI of brain showed remote infarction. CT abdomen/pelvis showed stercoral colitis.  Hospital Course:   Abdominal pain was likely related to his severe constipation and possible ischemic colitis secondary to his constipation. He was given an enema and stool softeners/laxatives with relief of his constipation and his abdominal pain.  He should continue miralax BID, colace and senna and use bisacodyl suppositories if he does not have a bowel movement in 48 hours.  He was started on ciprofloxacin and flagyl for colitis and will complete a 7-day course of antibiotics on 11/9, then stop.  His blood cultures are no growth to date.      Hypertensive emergency: Blood pressure initially 206/77. Patient has worsening renal function, elevated troponin, and increased symptoms of previous strokes consistent with hypertensive emergency. Head CT demonstrated no acute changes or hemorrhage and MRI brain was negative for acute stroke.  His troponins remained mildly elevated but flat.  His blood pressure medications were titrated and new medications were added to achieve better blood pressure control.  His stroke symptoms improved and his creatinine trended down.    Sinus bradycardia, asymptomatic.  His HR increased after his metoprolol was changed to labetalol.  His rate was typically in the 40s while on his previous metoprolol.    CAD and elevated trop: Troponin 0.97. ED physician called cardiology, Dr. Marlou Porch, who felt his elevated troponin was due to demanding ischemia.  Peak troponin 0.97, but fluctuated back and forth between  0.64 and 0.9.  He continued BB, aspirin and pravastatin and plavix.  LDL was above goal at 81 so his pravastatin was increased.  A1c was 6.2.  2d echo: Severe LVH with grade 1 DD and  probable biscupid AV but no evidence of wall motion abnormality.    COPD (chronic obstructive pulmonary disease) ([place]): stable.  Continued prn albuterol.    HLD: Last LDL was 46 on 11/23/07, currently 81.  Increased Pravastatin  Tobacco abuse and Alcohol abuse, counseled cessation and continued nicotine patch.   AKI:  Likely due to prerenal secondary to dehydration and continuation of ACEI. No hydronephrosis on CT abdomen/pelvis. Creatinine trended down with better blood pressure control and IVF.    DM-II: Last A1c 8.6 on 07/17/07, but currently 6.2.  He was given SSI.  Continue metformin.    Generalized weakness superimposed on previous stroke with persistent worsening of the left facial droop and slurred speech probably due to hypertension.  Neurology was consulted who agreed with assessment.  He was seen by PT/OT and speech therapy and recommended for SNF.  He will need ongoing therapy.    Dysphagia, choking episode witnessed by RN last night.  SLP recommended dysphagia 2 with nectar thick liquids for now but felt that he could potentially advance his diet, particularly his liquids, as he recovers from this illness.  Continue speech therapy.    Consultants:  Dr. Candee Furbish, Cardiology, via phone  Dr. Leonel Ramsay, Neurology  Procedures:  CT abd/pelvis  MRI brain  CT head  Antibiotics:  Ciprofloxacin 11/3  Flagyl 11/3  Discharge Exam: Filed Vitals:   03/10/15 0414  BP: 147/43  Pulse: 67  Temp: 98.4 F (36.9 C)  Resp: 18   Filed Vitals:   03/09/15 1000 03/09/15 1314 03/09/15 1954 03/10/15 0414  BP: 132/50 117/49 121/48 147/43  Pulse:  72 62 67  Temp:  98.4 F (36.9 C) 97.8 F (36.6 C) 98.4 F (36.9 C)  TempSrc:  Oral Oral Oral  Resp:  '18 20 18  '$ Height:      Weight:      SpO2:  98% 100% 99%     General: Adult male, No acute distress, blind with left facial droop  HEENT: NCAT, MMM  Cardiovascular: RRR, nl S1, S2, + gallop, 2+ pulses, warm  extremities  Respiratory: CTAB, no increased WOB  Abdomen: NABS, soft, ND, NT  MSK: Normal tone and bulk, no LEE  Neuro: Left facial droop improved today slightly with less slurring of his words  Discharge Instructions      Discharge Instructions    Call MD for:  difficulty breathing, headache or visual disturbances    Complete by:  As directed      Call MD for:  extreme fatigue    Complete by:  As directed      Call MD for:  hives    Complete by:  As directed      Call MD for:  persistant dizziness or light-headedness    Complete by:  As directed      Call MD for:  persistant nausea and vomiting    Complete by:  As directed      Call MD for:  severe uncontrolled pain    Complete by:  As directed      Call MD for:  temperature >100.4    Complete by:  As directed      Diet Carb Modified    Complete by:  As directed   Dysphagia 2 with  nectar thick     Increase activity slowly    Complete by:  As directed             Medication List    STOP taking these medications        gabapentin 100 MG capsule  Commonly known as:  NEURONTIN     lactulose 10 GM/15ML solution  Commonly known as:  CHRONULAC     metoprolol 50 MG tablet  Commonly known as:  LOPRESSOR     polyethylene glycol packet  Commonly known as:  MIRALAX / GLYCOLAX  Replaced by:  polyethylene glycol powder powder      TAKE these medications        ALPRAZolam 0.25 MG tablet  Commonly known as:  XANAX  Take 1 tablet (0.25 mg total) by mouth 3 (three) times daily.     amLODipine 5 MG tablet  Commonly known as:  NORVASC  Take 5 mg by mouth daily.     aspirin 325 MG tablet  Take 325 mg by mouth daily.     bisacodyl 10 MG suppository  Commonly known as:  DULCOLAX  Place 1 suppository (10 mg total) rectally daily as needed (no BM in 48 hours).     ciprofloxacin 500 MG tablet  Commonly known as:  CIPRO  Take 1 tablet (500 mg total) by mouth 2 (two) times daily.     clopidogrel 75 MG tablet   Commonly known as:  PLAVIX  Take 75 mg by mouth daily.     docusate sodium 100 MG capsule  Commonly known as:  COLACE  Take 100 mg by mouth 2 (two) times daily.     ferrous sulfate 325 (65 FE) MG tablet  Take 325 mg by mouth daily with breakfast.     Fish Oil 1000 MG Caps  Take 1,000 mg by mouth 3 (three) times daily.     furosemide 20 MG tablet  Commonly known as:  LASIX  Take 20 mg by mouth daily.     isosorbide-hydrALAZINE 20-37.5 MG tablet  Commonly known as:  BIDIL  Take 1 tablet by mouth 3 (three) times daily.     labetalol 200 MG tablet  Commonly known as:  NORMODYNE  Take 1 tablet (200 mg total) by mouth 3 (three) times daily.     lisinopril 20 MG tablet  Commonly known as:  PRINIVIL,ZESTRIL  Take 20 mg by mouth daily.     metFORMIN 500 MG tablet  Commonly known as:  GLUCOPHAGE  Take 500 mg by mouth 2 (two) times daily with a meal.     metroNIDAZOLE 500 MG tablet  Commonly known as:  FLAGYL  Take 1 tablet (500 mg total) by mouth 3 (three) times daily.     MULTIVITAMIN PO  Take 1 tablet by mouth daily as needed.     nicotine 14 mg/24hr patch  Commonly known as:  NICODERM CQ - dosed in mg/24 hours  Place 1 patch (14 mg total) onto the skin daily.     OS-CAL 500 + D PO  Take 1 tablet by mouth daily as needed.     polyethylene glycol powder powder  Commonly known as:  MIRALAX  Take 17 g by mouth 2 (two) times daily.     Potassium Chloride ER 20 MEQ Tbcr  Take 1 tablet by mouth daily.     pravastatin 80 MG tablet  Commonly known as:  PRAVACHOL  Take 1 tablet (80 mg total) by mouth daily at  6 PM.     RESOURCE THICKENUP CLEAR Powd  Per instructions to create nectar thick liquids     senna 8.6 MG Tabs tablet  Commonly known as:  SENOKOT  Take 2 tablets (17.2 mg total) by mouth daily.     tamsulosin 0.4 MG Caps capsule  Commonly known as:  FLOMAX  Take 0.4 mg by mouth daily after supper.       Follow-up Information    Follow up with Candee Furbish, MD. Schedule an appointment as soon as possible for a visit in 1 month.   Specialty:  Cardiology   Contact information:   [address] [place] [address] [phone]        The results of significant diagnostics from this hospitalization (including imaging, microbiology, ancillary and laboratory) are listed below for reference.    Significant Diagnostic Studies: Ct Abdomen Pelvis Wo Contrast  03/05/2015  CLINICAL DATA:  Lower abdominal pain and leukocytosis. EXAM: CT ABDOMEN AND PELVIS WITHOUT CONTRAST TECHNIQUE: Multidetector CT imaging of the abdomen and pelvis was performed following the standard protocol without IV contrast. COMPARISON:  None. FINDINGS: Lower chest: Right middle lobe 1.0 cm pulmonary nodule (series 3/image 4). Visualized median sternotomy wires are intact. Left circumflex and right coronary artery calcifications are noted. Hepatobiliary: Normal liver with no liver mass. Normal gallbladder with no radiopaque cholelithiasis. No biliary ductal dilatation. Pancreas: Normal, with no mass or duct dilation. Spleen: Normal size. No mass. Adrenals/Urinary Tract: Normal adrenals. No hydronephrosis. No contour deforming renal mass. Extensive calcifications throughout the bilateral renal sinuses are favored to represent vascular calcifications. Collapsed and grossly normal bladder. Stomach/Bowel: Mildly to moderately distended stomach filled predominantly with oral contrast, with no appreciable gastric wall thickening. Likely tiny hiatal hernia. Normal caliber small bowel with no small bowel wall thickening. Normal appendix . The rectum is moderately distended with stool to a diameter of 6.3 cm. There is circumferential mild rectal wall thickening with hazy perirectal fat stranding. There is moderate stool throughout the colon, with no colonic wall thickening. No colonic diverticulosis. Vascular/Lymphatic: Atherosclerotic nonaneurysmal abdominal aorta. No  pathologically enlarged lymph nodes in the abdomen or pelvis. Reproductive: Moderate prostatomegaly with mass effect on the bladder base by the enlarged nodular central lobe of the prostate. Nonspecific internal prostatic calcifications. Other: No pneumoperitoneum, ascites or focal fluid collection. Musculoskeletal: No aggressive appearing focal osseous lesions. Moderate degenerative changes in the lower lumbar spine. IMPRESSION: 1. Moderate rectal distention with stool with mild diffuse rectal wall thickening and hazy perirectal fat stranding. Findings suggest stercoral colitis. 2. Moderate colonic stool volume, suggesting constipation. 3. No evidence of small-bowel obstruction.  Normal appendix. 4. Right middle lobe 1.0 cm pulmonary nodule, neoplasm not excluded. Recommend further evaluation with PET-CT on a [name] outpatient basis. 5. Atherosclerosis, including at least two-vessel coronary artery disease. Please note that although the presence of coronary artery calcium documents the presence of coronary artery disease, the severity of this disease and any potential stenosis cannot be assessed on this non-gated CT examination. 6. Moderate prostatomegaly. Electronically Signed   By: Ilona Sorrel M.D.   On: 03/05/2015 19:21   Ct Head Wo Contrast  03/05/2015  CLINICAL DATA:  80 year old male with history of left-sided weakness. Abdominal pain. EXAM: CT HEAD WITHOUT CONTRAST TECHNIQUE: Contiguous axial images were obtained from the base of the skull through the vertex without intravenous contrast. COMPARISON:  Head CT 11/08/2013. FINDINGS: Mild cerebral atrophy. Patchy and confluent areas of decreased attenuation are noted throughout  the deep and periventricular white matter of the cerebral hemispheres bilaterally, compatible with chronic microvascular ischemic disease. Large old lacunar infarct extending from the head of the left caudate nucleus posteriorly into the adjacent white matter tracts. There is also  encephalomalacia from remote high right parietal infarct and posterior right occipital infarct, which are both unchanged. Left globe is atrophic and slightly sclerotic, similar to prior examinations. No acute intracranial abnormalities. Specifically, no evidence of acute intracranial hemorrhage, no definite findings of acute/subacute cerebral ischemia, no mass, mass effect, hydrocephalus or abnormal intra or extra-axial fluid collections. Visualized paranasal sinuses and mastoids are well pneumatized. No acute displaced skull fractures are identified. IMPRESSION: 1. No acute intracranial abnormalities. 2. Mild cerebral atrophy with extensive chronic ischemic changes redemonstrated, as above. Electronically Signed   By: Vinnie Langton M.D.   On: 03/05/2015 15:10   Mr Brain Wo Contrast  03/05/2015  CLINICAL DATA:  Left-sided weakness, worse than usual. Personal history previous infarct. EXAM: MRI HEAD WITHOUT CONTRAST TECHNIQUE: Multiplanar, multiecho pulse sequences of the brain and surrounding structures were obtained without intravenous contrast. COMPARISON:  CT head without contrast from the same day. MRI brain 10/14/2014. FINDINGS: The diffusion-weighted images demonstrate no evidence for acute or subacute infarction. The remote right occipital lobe infarct is again seen. The remote infarct of the left basal ganglia is stable. A remote right pontine infarct is evident. Moderate generalized atrophy is present. Confluent periventricular white matter disease is seen bilaterally with additional remote lacunar infarcts involving the corona radiata on the right. No acute infarct, hemorrhage, or mass lesion is present. The ventricles are proportionate to the degree of atrophy. The internal auditory canals are within normal limits. Cerebellum was unremarkable. Abnormal signal is again noted within the right internal carotid artery. There is flow in the left internal carotid artery and in the MCA vessels bilaterally.  Flow is present in the left vertebral artery and basilar artery. There is abnormal signal in the right vertebral artery, suggesting chronic occlusion. The left globe is chronically collapsed. The right globe and orbit are within normal limits. Mild mucosal thickening is present in the ethmoid air cells and maxillary sinuses. The mastoid air cells are clear. IMPRESSION: 1. Stable remote infarcts including the right cerebellum, left greater than right basal ganglia, brainstem, and right greater than left coronal radiata. 2. No acute intracranial abnormality. 3. Abnormal or occluded flow in the right internal carotid artery and right vertebral artery is stable. Electronically Signed   By: San Morelle M.D.   On: 03/05/2015 18:59   Dg Chest Portable 1 View  03/05/2015  CLINICAL DATA:  Abdominal pain and dyspnea. EXAM: PORTABLE CHEST 1 VIEW COMPARISON:  None. FINDINGS: A single AP portable view of the chest demonstrates no focal airspace consolidation or alveolar edema. The lungs are grossly clear. There is no large effusion or pneumothorax. Cardiac and mediastinal contours appear unremarkable. IMPRESSION: No active disease. Electronically Signed   By: Andreas Newport M.D.   On: 03/05/2015 22:38    Microbiology: Recent Results (from the past 240 hour(s))  MRSA PCR Screening     Status: None   Collection Time: 03/06/15  2:40 AM  Result Value Ref Range Status   MRSA by PCR NEGATIVE NEGATIVE Final    Comment:        The GeneXpert MRSA Assay (FDA approved for NASAL specimens only), is one component of a comprehensive MRSA colonization surveillance program. It is not intended to diagnose MRSA infection nor to guide or monitor  treatment for MRSA infections.      Labs: Basic Metabolic Panel:  Recent Labs Lab 03/06/15 0327 03/07/15 0407 03/08/15 0259 03/09/15 0159 03/10/15 0416  NA 141 142 143 140 141  K 4.0 4.1 3.8 3.4* 3.8  CL 105 107 108 105 109  CO2 '26 26 27 26 24  '$ GLUCOSE  125* 90 139* 160* 150*  BUN '12 8 7 7 7  '$ CREATININE 1.00 0.86 0.85 1.08 0.98  CALCIUM 9.2 8.8* 8.9 8.9 8.8*   Liver Function Tests:  Recent Labs Lab 03/05/15 1448  AST 41  ALT 21  ALKPHOS 94  BILITOT 0.7  PROT 8.6*  ALBUMIN 4.0    Recent Labs Lab 03/05/15 1448  LIPASE 26   No results for input(s): AMMONIA in the last 168 hours. CBC:  Recent Labs Lab 03/05/15 1448 03/06/15 0327 03/07/15 0407 03/08/15 0259 03/09/15 0159 03/10/15 0416  WBC 12.5* 10.8* 7.8 7.2 5.6 9.2  NEUTROABS 11.1*  --   --   --   --   --   HGB 15.4 14.6 12.5* 12.2* 11.1* 10.9*  HCT 45.4 42.4 37.9* 37.2* 34.1* 33.9*  MCV 84.9 86.0 88.3 87.7 87.2 87.6  PLT 258 241 231 241 230 243   Cardiac Enzymes:  Recent Labs Lab 03/06/15 0327 03/06/15 0730 03/06/15 1323  TROPONINI 0.64* 0.90* 0.64*   BNP: BNP (last 3 results) No results for input(s): BNP in the last 8760 hours.  ProBNP (last 3 results) No results for input(s): PROBNP in the last 8760 hours.  CBG:  Recent Labs Lab 03/09/15 0608 03/09/15 1108 03/09/15 1614 03/09/15 2118 03/10/15 0626  GLUCAP 130* 125* 131* 125* 109*    Time coordinating discharge: 35 minutes  Signed:  Raffi Milstein  [facility] 03/10/2015, 8:20 AM

## 2015-03-10 NOTE — Care Management Note (Signed)
Case Management Note  Patient Details  Name: [name] MRN: [id number] Date of Birth: [birth year]  Subjective/Objective:   80 y.o. M admitted 03/05/2015 with abdominal pain determined to be  ischemic colitis secondary to his chronic constipation. PMH CVA with residual swallowing difficulties, facial droop and LSW.                Action/Plan:Discharge   Expected Discharge Date:                  Expected Discharge Plan:     In-House Referral:     Discharge planning Services     Post Acute Care Choice:    Choice offered to:     DME Arranged:    DME Agency:     HH Arranged:    [place] Agency:     Status of Service:     Medicare Important Message Given:  Yes-second notification given Date Medicare IM Given:    Medicare IM give by:    Date Additional Medicare IM Given:    Additional Medicare Important Message give by:     If discussed at [place] of Stay Meetings, dates discussed:    Additional Comments:Will be discharged to SNF when bed available.   CrutchfieldAntony Haste, RN 03/10/2015, 11:57 AM

## 2015-03-10 NOTE — Clinical Social Work Placement (Signed)
   CLINICAL SOCIAL WORK PLACEMENT  NOTE  Date:  03/10/2015  Patient Details  Name: [name] MRN: [id number] Date of Birth: 1934/12/21  Clinical Social Work is seeking post-discharge placement for this patient at the [place] level of care (*CSW will initial, date and re-position this form in  chart as items are completed):  Yes   Patient/family provided with [place] Work Department's list of facilities offering this level of care within the geographic area requested by the patient (or if unable, by the patient's family).  Yes   Patient/family informed of their freedom to choose among providers that offer the needed level of care, that participate in Medicare, Medicaid or managed care program needed by the patient, have an available bed and are willing to accept the patient.  Yes   Patient/family informed of [place]'s ownership interest in [facility] and [facility], as well as of the fact that they are under no obligation to receive care at these facilities.  PASRR submitted to EDS on       PASRR number received on       Existing PASRR number confirmed on 03/08/15     FL2 transmitted to all facilities in geographic area requested by pt/family on 03/08/15     FL2 transmitted to all facilities within larger geographic area on       Patient informed that his/her managed care company has contracts with or will negotiate with certain facilities, including the following:   [facility])     Yes   Patient/family informed of bed offers received.  Patient chooses bed at [facility]     Physician recommends and patient chooses bed at      Patient to be transferred to [facility] on 03/10/15.  Patient to be transferred to facility by Ambulance [name])     Patient family notified on 03/10/15 of transfer.  Name of family member notified:  Daughter- Darlen Round     PHYSICIAN Please sign  FL2, Please prepare priority discharge summary, including medications, Please prepare prescriptions     Additional Comment: Ok per MD for d/c today to SNF. Late d/c arranged with ok of patient and daughter to rehab at [facility]. Daughter indicated a preference for placement at [facility] as this facility is close to her home. This facility was not included in on the placement attempts and this CSW assured daughter that this facility would be sent referral. Patient is in agreement with this but is also aware and agreeable with the [facility]. Daughter is agreeable to the [facility] but is hoping to get her father placed closer to her home. She anticipates possible need for Medicaid if patient is unable to return home at end of his rehab stay- however- she indicated that he has refused any long term placement in the past and insisted on returning home.  CSW discussed possible Medicaid application with daughter and she will follow up with her daughter on this.  Nursing notified to call report;  DC summary sent to facility and dc packet prepared for EMS.  No further CSW needs identified.  CSW signing off.     _______________________________________________ Williemae Area, LCSW 03/10/2015, 6:00 PM

## 2015-03-11 ENCOUNTER — Other Ambulatory Visit: Payer: Self-pay | Admitting: *Deleted

## 2015-03-11 MED ORDER — ALPRAZOLAM 0.25 MG PO TABS: ORAL_TABLET | ORAL | Status: DC

## 2015-03-11 NOTE — Telephone Encounter (Signed)
[facility] 

## 2015-03-12 ENCOUNTER — Non-Acute Institutional Stay (SKILLED_NURSING_FACILITY): Payer: Medicare Other | Admitting: Internal Medicine

## 2015-03-12 DIAGNOSIS — J449 Chronic obstructive pulmonary disease, unspecified: Secondary | ICD-10-CM

## 2015-03-12 DIAGNOSIS — I10 Essential (primary) hypertension: Secondary | ICD-10-CM | POA: Diagnosis not present

## 2015-03-12 DIAGNOSIS — K559 Vascular disorder of intestine, unspecified: Secondary | ICD-10-CM | POA: Diagnosis not present

## 2015-03-15 ENCOUNTER — Emergency Department (HOSPITAL_COMMUNITY): Payer: Medicare Other

## 2015-03-15 ENCOUNTER — Encounter (HOSPITAL_COMMUNITY): Payer: Self-pay | Admitting: *Deleted

## 2015-03-15 ENCOUNTER — Emergency Department (HOSPITAL_COMMUNITY)
Admission: EM | Admit: 2015-03-15 | Discharge: 2015-03-15 | Disposition: A | Discharge status: Skilled Nursing Facility | Payer: Medicare Other | Attending: Emergency Medicine | Admitting: Emergency Medicine

## 2015-03-15 DIAGNOSIS — Z8719 Personal history of other diseases of the digestive system: Secondary | ICD-10-CM | POA: Insufficient documentation

## 2015-03-15 DIAGNOSIS — Z7982 Long term (current) use of aspirin: Secondary | ICD-10-CM | POA: Diagnosis not present

## 2015-03-15 DIAGNOSIS — R29810 Facial weakness: Secondary | ICD-10-CM

## 2015-03-15 DIAGNOSIS — Z9889 Other specified postprocedural states: Secondary | ICD-10-CM | POA: Diagnosis not present

## 2015-03-15 DIAGNOSIS — Z8669 Personal history of other diseases of the nervous system and sense organs: Secondary | ICD-10-CM | POA: Diagnosis not present

## 2015-03-15 DIAGNOSIS — E785 Hyperlipidemia, unspecified: Secondary | ICD-10-CM | POA: Diagnosis not present

## 2015-03-15 DIAGNOSIS — J449 Chronic obstructive pulmonary disease, unspecified: Secondary | ICD-10-CM | POA: Insufficient documentation

## 2015-03-15 DIAGNOSIS — Z87891 Personal history of nicotine dependence: Secondary | ICD-10-CM | POA: Insufficient documentation

## 2015-03-15 DIAGNOSIS — I10 Essential (primary) hypertension: Secondary | ICD-10-CM | POA: Diagnosis not present

## 2015-03-15 DIAGNOSIS — Z7984 Long term (current) use of oral hypoglycemic drugs: Secondary | ICD-10-CM | POA: Insufficient documentation

## 2015-03-15 DIAGNOSIS — Z7902 Long term (current) use of antithrombotics/antiplatelets: Secondary | ICD-10-CM | POA: Insufficient documentation

## 2015-03-15 DIAGNOSIS — Z8673 Personal history of transient ischemic attack (TIA), and cerebral infarction without residual deficits: Secondary | ICD-10-CM | POA: Diagnosis not present

## 2015-03-15 DIAGNOSIS — K5900 Constipation, unspecified: Secondary | ICD-10-CM | POA: Insufficient documentation

## 2015-03-15 DIAGNOSIS — I2510 Atherosclerotic heart disease of native coronary artery without angina pectoris: Secondary | ICD-10-CM | POA: Insufficient documentation

## 2015-03-15 DIAGNOSIS — R110 Nausea: Secondary | ICD-10-CM | POA: Diagnosis not present

## 2015-03-15 DIAGNOSIS — E119 Type 2 diabetes mellitus without complications: Secondary | ICD-10-CM | POA: Insufficient documentation

## 2015-03-15 HISTORY — DX: Dysphagia, unspecified: R13.10

## 2015-03-15 HISTORY — DX: Unspecified glaucoma: H40.9

## 2015-03-15 HISTORY — DX: Constipation, unspecified: K59.00

## 2015-03-15 HISTORY — DX: Cerebral infarction, unspecified: I63.9

## 2015-03-15 LAB — CBC WITH DIFFERENTIAL/PLATELET
BASOS PCT: 0 %
Basophils Absolute: 0 10*3/uL (ref 0.0–0.1)
Eosinophils Absolute: 0.3 10*3/uL (ref 0.0–0.7)
Eosinophils Relative: 3 %
HCT: 36.1 % — ABNORMAL LOW (ref 39.0–52.0)
Hemoglobin: 11.9 g/dL — ABNORMAL LOW (ref 13.0–17.0)
Lymphocytes Relative: 16 %
Lymphs Abs: 1.9 10*3/uL (ref 0.7–4.0)
MCH: 29.3 pg (ref 26.0–34.0)
MCHC: 33 g/dL (ref 30.0–36.0)
MCV: 88.9 fL (ref 78.0–100.0)
MONO ABS: 0.7 10*3/uL (ref 0.1–1.0)
MONOS PCT: 6 %
NEUTROS PCT: 75 %
Neutro Abs: 8.9 10*3/uL — ABNORMAL HIGH (ref 1.7–7.7)
Platelets: 389 10*3/uL (ref 150–400)
RBC: 4.06 MIL/uL — ABNORMAL LOW (ref 4.22–5.81)
RDW: 14.3 % (ref 11.5–15.5)
WBC: 11.8 10*3/uL — ABNORMAL HIGH (ref 4.0–10.5)

## 2015-03-15 LAB — COMPREHENSIVE METABOLIC PANEL
ALBUMIN: 3.5 g/dL (ref 3.5–5.0)
ALT: 24 U/L (ref 17–63)
ANION GAP: 7 (ref 5–15)
AST: 25 U/L (ref 15–41)
Alkaline Phosphatase: 71 U/L (ref 38–126)
BUN: 10 mg/dL (ref 6–20)
CHLORIDE: 105 mmol/L (ref 101–111)
CO2: 29 mmol/L (ref 22–32)
Calcium: 9.4 mg/dL (ref 8.9–10.3)
Creatinine, Ser: 0.92 mg/dL (ref 0.61–1.24)
GFR calc Af Amer: >60 mL/min (ref >60)
GFR calc non Af Amer: >60 mL/min (ref >60)
GLUCOSE: 133 mg/dL — AB (ref 65–99)
POTASSIUM: 4 mmol/L (ref 3.5–5.1)
SODIUM: 141 mmol/L (ref 135–145)
TOTAL PROTEIN: 7.3 g/dL (ref 6.5–8.1)
Total Bilirubin: 0.4 mg/dL (ref 0.3–1.2)

## 2015-03-15 LAB — URINALYSIS, ROUTINE W REFLEX MICROSCOPIC
Bilirubin Urine: NEGATIVE
Glucose, UA: NEGATIVE mg/dL
Hgb urine dipstick: NEGATIVE
KETONES UR: NEGATIVE mg/dL
LEUKOCYTES UA: NEGATIVE
NITRITE: NEGATIVE
PH: 8.5 — AB (ref 5.0–8.0)
Protein, ur: NEGATIVE mg/dL
SPECIFIC GRAVITY, URINE: 1.015 (ref 1.005–1.030)
Urobilinogen, UA: 0.2 mg/dL (ref 0.0–1.0)

## 2015-03-15 MED ORDER — ONDANSETRON HCL 4 MG/2ML IJ SOLN
4.0000 mg | Freq: Once | INTRAMUSCULAR | Status: AC
  Administered 2015-03-15: 4 mg via INTRAVENOUS
  Filled 2015-03-15: qty 2

## 2015-03-15 NOTE — ED Provider Notes (Signed)
CSN: [id number]     Arrival date & time 03/15/15  1334 History   First MD Initiated Contact with Patient 03/15/15 1340     Chief Complaint  Patient presents with  . Facial Droop     (Consider location/radiation/quality/duration/timing/severity/associated sxs/prior Treatment) HPI Comments: The patient is a 80 year old male who is coming from the [place] where he was placed after being admitted to the hospital for colitis, known history of stroke with left-sided facial droop. During his evaluation 10 days ago he was evaluated for colitis with a CT scan of the abdomen and pelvis as well as with MRI of the brain to evaluate for new infarct. The MRI of the brain showed no  or acute infarct though it did show multiple old infarcts which were well-known and established. the patient presents from this nursing facility today because of possible worsening of the left-sided facial droop. According to the patient he is here because of nausea, there is been no diarrhea, no abdominal pain, no fevers, no coughing. Discussed with nurses at nursing facility, they report that he has had possible increased droop but they have only seen him today - no one that knows him from past is available.  They know that he has hx of facial droop.   The history is provided by the patient.    Past Medical History  Diagnosis Date  . CAD (coronary artery disease)     multi vessel. s/p recent bare metal stenting of high grade proximal R CA stenosis. residual 70% : main in-stent restenosis, with distal filling of the left anterior descending from graft flow. patent bypass graft, except for 100% occlusion of saphernous vein graft - 1st diagonal branch. 3-vessel CABG 2000.  Marland Kitchen CAD (coronary artery disease)     s/p of 95% L main artery stenosis, 4/03: cutting balloon PTCA of 80% in-stent restenosis, 7/04. preserved L ventriculr function  . Carotid bruit     bilateral. no sigificant distal abd atherosclerosis by recent  cath.   Marland Kitchen COPD (chronic obstructive pulmonary disease) (HCC)     ongoing tobacco   . DM2 (diabetes mellitus, type 2) ([place])   . Dyslipidemia   . HTN (hypertension)   . Glaucoma   . CVA (cerebral infarction)   . Dysphagia   . Constipation    Past Surgical History  Procedure Laterality Date  . Cardiac catheterization     Family History  Problem Relation Age of Onset  . Hypertension Mother    Social History  Substance Use Topics  . Smoking status: Former Smoker    Quit date: 08/03/2014  . Smokeless tobacco: None     Comment: 50 pack year hx   . Alcohol Use: No     Comment: has not had alcohol in 30-40 years     Review of Systems  All other systems reviewed and are negative.     Allergies  Review of patient's allergies indicates no known allergies.  Home Medications   Prior to Admission medications   Medication Sig Start Date End Date Taking? Authorizing Provider  ALPRAZolam Duanne Moron) 0.25 MG tablet Take one tablet by mouth three times daily for anxiety Patient taking differently: Take 0.25 mg by mouth 3 (three) times daily. for anxiety 03/11/15  Yes Lauree Chandler, NP  amLODipine (NORVASC) 5 MG tablet Take 5 mg by mouth daily.   Yes Historical Provider, MD  aspirin 325 MG tablet Take 325 mg by mouth daily.     Yes Historical Provider,  MD  bisacodyl (DULCOLAX) 10 MG suppository Place 1 suppository (10 mg total) rectally daily as needed (no BM in 48 hours). 03/10/15  Yes Janece Canterbury, MD  Calcium Carbonate-Vitamin D (OS-CAL 500 + D PO) Take 1 tablet by mouth daily as needed.   Yes Historical Provider, MD  ciprofloxacin (CIPRO) 500 MG tablet Take 1 tablet (500 mg total) by mouth 2 (two) times daily. 03/10/15  Yes Janece Canterbury, MD  clopidogrel (PLAVIX) 75 MG tablet Take 75 mg by mouth daily.   Yes Historical Provider, MD  docusate sodium (COLACE) 100 MG capsule Take 100 mg by mouth 2 (two) times daily.   Yes Historical Provider, MD  ferrous sulfate 325 (65 FE) MG tablet  Take 325 mg by mouth daily with breakfast.   Yes Historical Provider, MD  furosemide (LASIX) 20 MG tablet Take 20 mg by mouth daily.   Yes Historical Provider, MD  isosorbide-hydrALAZINE (BIDIL) 20-37.5 MG tablet Take 1 tablet by mouth 3 (three) times daily. 03/10/15  Yes Janece Canterbury, MD  labetalol (NORMODYNE) 200 MG tablet Take 1 tablet (200 mg total) by mouth 3 (three) times daily. 03/10/15  Yes Janece Canterbury, MD  lisinopril (PRINIVIL,ZESTRIL) 20 MG tablet Take 20 mg by mouth daily.     Yes Historical Provider, MD  metFORMIN (GLUCOPHAGE) 500 MG tablet Take 500 mg by mouth 2 (two) times daily with a meal.   Yes Historical Provider, MD  metroNIDAZOLE (FLAGYL) 500 MG tablet Take 1 tablet (500 mg total) by mouth 3 (three) times daily. 03/10/15  Yes Janece Canterbury, MD  Multiple Vitamins-Minerals (MULTIVITAMIN PO) Take 1 tablet by mouth daily as needed.   Yes Historical Provider, MD  nystatin (MYCOSTATIN) 100000 UNIT/ML suspension Take 5 mLs by mouth 4 (four) times daily. 7 day course starting on 03/13/15   Yes Historical Provider, MD  Omega-3 Fatty Acids (FISH OIL) 1000 MG CAPS Take 1,000 mg by mouth 3 (three) times daily.   Yes Historical Provider, MD  Potassium Chloride ER 20 MEQ TBCR Take 1 tablet by mouth daily.   Yes Historical Provider, MD  pravastatin (PRAVACHOL) 80 MG tablet Take 1 tablet (80 mg total) by mouth daily at 6 PM. 03/10/15  Yes Janece Canterbury, MD  senna (SENOKOT) 8.6 MG TABS tablet Take 2 tablets (17.2 mg total) by mouth daily. 03/10/15  Yes Janece Canterbury, MD  tamsulosin (FLOMAX) 0.4 MG CAPS capsule Take 0.4 mg by mouth daily after supper.   Yes Historical Provider, MD  Maltodextrin-Xanthan Gum ([place]) POWD Per instructions to create nectar thick liquids Patient not taking: Reported on 03/15/2015 03/10/15   Janece Canterbury, MD  nicotine (NICODERM CQ - DOSED IN MG/24 HOURS) 14 mg/24hr patch Place 1 patch (14 mg total) onto the skin daily. 03/10/15   Janece Canterbury, MD  polyethylene glycol powder (MIRALAX) powder Take 17 g by mouth 2 (two) times daily. 03/10/15   Janece Canterbury, MD   BP 122/54 mmHg  Pulse 65  Temp(Src) 97.5 F (36.4 C) (Oral)  Resp 18  SpO2 99% Physical Exam  Constitutional: He appears well-developed and well-nourished. No distress.  HENT:  Head: Normocephalic and atraumatic.  Mouth/Throat: Oropharynx is clear and moist. No oropharyngeal exudate.  Eyes: Conjunctivae and EOM are normal. Pupils are equal, round, and reactive to light. Right eye exhibits no discharge. Left eye exhibits no discharge. No scleral icterus.  Neck: Normal range of motion. Neck supple. No JVD present. No thyromegaly present.  Cardiovascular: Normal rate, regular rhythm, normal heart  sounds and intact distal pulses.  Exam reveals no gallop and no friction rub.   No murmur heard. Pulmonary/Chest: Effort normal and breath sounds normal. No respiratory distress. He has no wheezes. He has no rales.  Abdominal: Soft. Bowel sounds are normal. He exhibits no distension and no mass. There is no tenderness.  Musculoskeletal: Normal range of motion. He exhibits no edema or tenderness.  Lymphadenopathy:    He has no cervical adenopathy.  Neurological: He is alert. Coordination normal.  L sided facial droop - normal strength in all 4 extremities.  Has normal coordination given his blindness.  Has some detection of light and movement in the R eye - blind in L.  Skin: Skin is warm and dry. No rash noted. No erythema.  Psychiatric: He has a normal mood and affect. His behavior is normal.  Nursing note and vitals reviewed.   ED Course  Procedures (including critical care time) Labs Review Labs Reviewed  CBC WITH DIFFERENTIAL/PLATELET - Abnormal; Notable for the following:    WBC 11.8 (*)    RBC 4.06 (*)    Hemoglobin 11.9 (*)    HCT 36.1 (*)    Neutro Abs 8.9 (*)    All other components within normal limits  COMPREHENSIVE METABOLIC PANEL - Abnormal; Notable  for the following:    Glucose, Bld 133 (*)    All other components within normal limits  URINALYSIS, ROUTINE W REFLEX MICROSCOPIC (NOT AT [facility]) - Abnormal; Notable for the following:    Color, Urine AMBER (*)    pH 8.5 (*)    All other components within normal limits    Imaging Review Ct Head Wo Contrast  03/15/2015  CLINICAL DATA:  Increased left facial droop. Right side weakness. Initial encounter. EXAM: CT HEAD WITHOUT CONTRAST TECHNIQUE: Contiguous axial images were obtained from the base of the skull through the vertex without intravenous contrast. COMPARISON:  Brain MRI and head CT scans 03/05/2015. FINDINGS: The brain is atrophic with extensive chronic microvascular ischemic change. Remote infarctions in the right occipital lobe and basal ganglia are again seen. No evidence of acute infarction, hemorrhage, mass lesion, mass effect, midline shift or abnormal extra-axial fluid collection is identified. No hydrocephalus or pneumocephalus. The calvarium is intact. IMPRESSION: No acute abnormality in patient with atrophy, remote infarcts and extensive chronic microvascular ischemic change. Electronically Signed   By: Inge Rise M.D.   On: 03/15/2015 17:08   I have personally reviewed and evaluated these images and lab results as part of my medical decision-making.   EKG Interpretation   Date/Time:  Saturday March 15 2015 13:43:07 EST Ventricular Rate:  66 PR Interval:  154 QRS Duration: 90 QT Interval:  404 QTC Calculation: 423 R Axis:   65 Text Interpretation:  Sinus rhythm Probable LVH with secondary repol abnrm  Anterior ST elevation, probably due to LVH since last tracing no  significant change Confirmed by Lavonna Lampron  MD, Eion Timbrook ([id number]) on 03/15/2015  5:13:54 PM      MDM   Final diagnoses:  Facial droop    Other than the left-sided facial droop I do not detect any acute abnormalities on his exam. This facial droop has been well-established in the past.   D/w Dr.  Merlene Laughter at 2:55 - recommends repeat MR brain and labs to r/o UTI - ordered, will follow.  CT neg - MRI unavailable - VS normal otherwise, stable, no decompensation in the pt condition in the ED, stable appearing for d/c.  Noemi Chapel, MD  03/15/15 1936 

## 2015-03-15 NOTE — ED Notes (Signed)
Pt comes in from [facility]. Pt has hx of cva with normal left side facial droop and right sided weakness. Caregiver reports that he woke up this morning with facial droop that is more significant. Primary nurse at bedside. Pt is c/o "stomach upset."

## 2015-03-15 NOTE — ED Notes (Signed)
Pt resting quietly with family at bedside.  No distress.   Eating ice cream without difficulty.

## 2015-03-17 ENCOUNTER — Non-Acute Institutional Stay (SKILLED_NURSING_FACILITY): Payer: Medicare Other | Admitting: Internal Medicine

## 2015-03-17 ENCOUNTER — Encounter (HOSPITAL_COMMUNITY)
Admission: RE | Admit: 2015-03-17 | Discharge: 2015-03-17 | Disposition: A | Discharge status: Home / Self Care | Payer: Medicare Other | Source: Skilled Nursing Facility | Attending: Internal Medicine | Admitting: Internal Medicine

## 2015-03-17 DIAGNOSIS — I6320 Cerebral infarction due to unspecified occlusion or stenosis of unspecified precerebral arteries: Secondary | ICD-10-CM | POA: Diagnosis not present

## 2015-03-17 DIAGNOSIS — K559 Vascular disorder of intestine, unspecified: Secondary | ICD-10-CM | POA: Diagnosis not present

## 2015-03-17 LAB — BASIC METABOLIC PANEL
ANION GAP: 6 (ref 5–15)
BUN: 13 mg/dL (ref 6–20)
CO2: 27 mmol/L (ref 22–32)
Calcium: 9.1 mg/dL (ref 8.9–10.3)
Chloride: 105 mmol/L (ref 101–111)
Creatinine, Ser: 0.99 mg/dL (ref 0.61–1.24)
GFR calc Af Amer: >60 mL/min (ref >60)
Glucose, Bld: 113 mg/dL — ABNORMAL HIGH (ref 65–99)
POTASSIUM: 4.2 mmol/L (ref 3.5–5.1)
SODIUM: 138 mmol/L (ref 135–145)

## 2015-03-17 LAB — CBC WITH DIFFERENTIAL/PLATELET
BASOS ABS: 0 10*3/uL (ref 0.0–0.1)
Basophils Relative: 0 %
Eosinophils Absolute: 0.4 10*3/uL (ref 0.0–0.7)
Eosinophils Relative: 4 %
HCT: 34.5 % — ABNORMAL LOW (ref 39.0–52.0)
HEMOGLOBIN: 11.4 g/dL — AB (ref 13.0–17.0)
LYMPHS PCT: 28 %
Lymphs Abs: 2.5 10*3/uL (ref 0.7–4.0)
MCH: 29.6 pg (ref 26.0–34.0)
MCHC: 33 g/dL (ref 30.0–36.0)
MCV: 89.6 fL (ref 78.0–100.0)
Monocytes Absolute: 0.8 10*3/uL (ref 0.1–1.0)
Monocytes Relative: 9 %
NEUTROS ABS: 5.3 10*3/uL (ref 1.7–7.7)
Neutrophils Relative %: 59 %
Platelets: 390 10*3/uL (ref 150–400)
RBC: 3.85 MIL/uL — AB (ref 4.22–5.81)
RDW: 14.2 % (ref 11.5–15.5)
WBC: 9 10*3/uL (ref 4.0–10.5)

## 2015-03-19 ENCOUNTER — Other Ambulatory Visit: Payer: Self-pay | Admitting: Internal Medicine

## 2015-03-19 DIAGNOSIS — R1310 Dysphagia, unspecified: Secondary | ICD-10-CM

## 2015-03-19 NOTE — Progress Notes (Addendum)
Patient ID: [name], male   DOB: 17-Aug-1934, 80 y.o.   MRN: [id number]                HISTORY & PHYSICAL  DATE:  03/12/2015         FACILITY: [facility]                  LEVEL OF CARE:   SNF   CHIEF COMPLAINT:  Admission to SNF, post stay at [facility], 03/05/2015 through 03/10/2015.    HISTORY OF PRESENT ILLNESS:  This is a man with known coronary artery disease who presented to the hospital with severe pain.  This was largely located in the lower abdomen, not radiating.  MRI of the brain showed a remote infarction.  CT scan was negative.  CT scan of the abdomen and pelvis showed stercoral colitis.  He was given aggressive laxatives as his ischemic colitis was felt secondary to constipation.  He was placed on ciprofloxacin and Flagyl for seven days until 03/12/2015 and then this stopped.  His blood cultures were negative.    He was also noted to have a hypertensive urgency with blood pressure initially 202/77 and worsening renal function.    He was also noted to have elevated troponins.   This was felt secondary to demand ischemia.  These fluctuated between 0.6 and 0.9.    LDL was 81.    Hemoglobin A1c 6.2.    A 2D-echo showed severe LVH with grade 1 diastolic dysfunction and a bicuspid aortic valve.    Past Medical History  Diagnosis Date  . CAD (coronary artery disease)     multi vessel. s/p recent bare metal stenting of high grade proximal R CA stenosis. residual 70% : main in-stent restenosis, with distal filling of the left anterior descending from graft flow. patent bypass graft, except for 100% occlusion of saphernous vein graft - 1st diagonal branch. 3-vessel CABG 2000.  Marland Kitchen CAD (coronary artery disease)     s/p of 95% L main artery stenosis, 4/03: cutting balloon PTCA of 80% in-stent restenosis, 7/04. preserved L ventriculr function  . Carotid bruit     bilateral. no sigificant distal abd atherosclerosis by recent cath.   Marland Kitchen COPD (chronic obstructive  pulmonary disease) (HCC)     ongoing tobacco   . DM2 (diabetes mellitus, type 2) ([place])   . Dyslipidemia   . HTN (hypertension)   . Glaucoma   . CVA (cerebral infarction)   . Dysphagia   . Constipation    Past Surgical History  Procedure Laterality Date  . Cardiac catheterization                     CURRENT MEDICATIONS:  Discharge medications included:     Xanax 0.25 three times a day.    Amlodipine 10 q.d.     ASA 325 q.d.     Ciprofloxacin 500 b.i.d.      Plavix 75 q.d.      Dulcolax 10 mg daily.    Colace 100 b.i.d.     Ferrous sulfate 325 daily.    Fish oil 1000 three times a day.    Lasix 20 q.d.      BiDil 20/37.5, 1 tablet three times a day.    Labetalol 200 three times a day.    Lisinopril 20 q.d.      Metformin 500 two times daily.    Flagyl 500 t.i.d., also should be stopping on 03/12/2015.  Nicotine patch, even though he has not smoked in over a year.    MiraLAX 17 g b.i.d.      Potassium 20 mEq daily.    Pravachol 80 q.d.     Senna 2 tablets/17.2 daily.    Flomax 0.4 q.d.       SOCIAL HISTORY:                   HOUSING:  The patient tells me he lives with multiple family members in his own home.   FUNCTIONAL STATUS:  He is reasonably independent with ADLs.  He is blind.  Therefore, he needs assistance for meal preparation, etc.  The family members take care of this.    reports that he quit smoking about 7 months ago. He does not have any smokeless tobacco history on file. He reports that he does not drink alcohol or use illicit drugs.  family history includes Hypertension in his mother.  REVIEW OF SYSTEMS:       CHEST/RESPIRATORY:  No shortness of breath.   CARDIAC:  No chest pain.    GI:  States his abdominal pain is resolved.  He is still talking about no bowel movements in 4-5 days.   GU:  No dysuria.    MUSCULOSKELETAL:  No joint pain.   NEUROLOGICAL:  States he is able to walk around at home with a walker.    PHYSICAL  EXAMINATION:   GENERAL APPEARANCE:  Very pleasant man in no distress.   HEENT:   EYES:  He is blind.  He has some vision in the right eye, apparently.    MOUTH/THROAT:  Oral exam is normal.  One of the nurses was concerned about thrush.  I think this was just a coated tongue.     CHEST/RESPIRATORY:  Clear air entry bilaterally.     CARDIOVASCULAR:   CARDIAC:  Heart sounds are normal.   GASTROINTESTINAL:   ABDOMEN:  Perhaps mildly distended.   LIVER/SPLEEN/KIDNEY:   No liver, no spleen.   GENITOURINARY:   BLADDER:  Not enlarged.  No CVA tenderness.    CIRCULATION:   EDEMA/VARICOSITIES:  Extremities:  No edema.     ARTERIAL:  Peripheral pulses are palpable.    NEUROLOGICAL:   CRANIAL NERVES:   The patient appears to have lower motor neuron left facial weakness.   This is said to be due to a stroke.  There is mild weakness on the left side.    MOTOR:  Mild left pronator drift.   SENSATION/STRENGTH:  He has antigravity strength.       PSYCHIATRIC:   MENTAL STATUS:   I see no major issues here.     ASSESSMENT/PLAN:                   Ischemic colitis in the setting of severe constipation.  He has not had a bowel movement, per the patient, in 4-5 days.  He is completing Cipro and Flagyl.    Type 2 diabetes.  Apparently without complications, although the patient states he has retinopathy.    Poorly controlled hypertension on presentation, with elevated BUN and creatinine (hypertensive urgency).  This is now more controlled.    Elevated troponins.  Felt secondary to demand ischemic.    COPD.  This does not appear to be unstable.    Acute kidney failure.   CT scan of the abdomen and pelvis showed no hydronephrosis.  His discharge creatinine was 0.98 and potassium 3.8.  White  count 9.2.     I think this patient is reasonably stable.    Attention to his bowels.  His abdomen seems benign.    We will monitor his blood pressure.      CPT CODE: 87276

## 2015-03-21 NOTE — Progress Notes (Addendum)
Patient ID: [name], male   DOB: 09/29/1934, 80 y.o.   MRN: [id number]                PROGRESS NOTE  DATE:  03/17/2015           FACILITY: [facility]                    LEVEL OF CARE:   SNF   Acute Visit                    CHIEF COMPLAINT:  Follow up trip to the ER.     HISTORY OF PRESENT ILLNESS:  [name] is a patient whom I admitted to the building last week.    He was felt to have abdominal pain secondary to constipation and ischemic colitis.  Discharged here on a seven-day course of ciprofloxacin and Flagyl.  From this regard, everything has been stable.   He has not been complaining of pain.    He also was felt to have a hypertensive urgency when he arrived in the hospital with a blood pressure of 206/77.    On Saturday, the nurses here noted a left facial droop and sent him to the ER.  A CT scan of the head was negative.  Lab work showed an essentially normal comprehensive metabolic panel.   CBC:   White count 11.8, hemoglobin 11.9, normal differential.    The patient had a previous MRI of the brain on November 2nd.  This showed no acute or subacute infarction.  However, he does have stable remote infarcts involving the right cerebellum, left greater than right basal ganglia, brainstem, and right greater than left corona radiata.    Past Medical History  Diagnosis Date  . CAD (coronary artery disease)     multi vessel. s/p recent bare metal stenting of high grade proximal R CA stenosis. residual 70% : main in-stent restenosis, with distal filling of the left anterior descending from graft flow. patent bypass graft, except for 100% occlusion of saphernous vein graft - 1st diagonal branch. 3-vessel CABG 2000.  Marland Kitchen CAD (coronary artery disease)     s/p of 95% L main artery stenosis, 4/03: cutting balloon PTCA of 80% in-stent restenosis, 7/04. preserved L ventriculr function  . Carotid bruit     bilateral. no sigificant distal abd atherosclerosis by recent  cath.   Marland Kitchen COPD (chronic obstructive pulmonary disease) (HCC)     ongoing tobacco   . DM2 (diabetes mellitus, type 2) ([place])   . Dyslipidemia   . HTN (hypertension)   . Glaucoma   . CVA (cerebral infarction)   . Dysphagia   . Constipation       Past Surgical History  Procedure Laterality Date  . Cardiac catheterization      Current Outpatient Prescriptions on File Prior to Visit  Medication Sig Dispense Refill  . ALPRAZolam (XANAX) 0.25 MG tablet Take one tablet by mouth three times daily for anxiety (Patient taking differently: Take 0.25 mg by mouth 3 (three) times daily. for anxiety) 90 tablet 5  . amLODipine (NORVASC) 5 MG tablet Take 5 mg by mouth daily.    Marland Kitchen aspirin 325 MG tablet Take 325 mg by mouth daily.      . bisacodyl (DULCOLAX) 10 MG suppository Place 1 suppository (10 mg total) rectally daily as needed (no BM in 48 hours). 12 suppository 0  . Calcium Carbonate-Vitamin D (OS-CAL 500 + D PO) Take  1 tablet by mouth daily as needed.    . ciprofloxacin (CIPRO) 500 MG tablet Take 1 tablet (500 mg total) by mouth 2 (two) times daily. 4 tablet 0  . clopidogrel (PLAVIX) 75 MG tablet Take 75 mg by mouth daily.    Marland Kitchen docusate sodium (COLACE) 100 MG capsule Take 100 mg by mouth 2 (two) times daily.    . ferrous sulfate 325 (65 FE) MG tablet Take 325 mg by mouth daily with breakfast.    . furosemide (LASIX) 20 MG tablet Take 20 mg by mouth daily.    . isosorbide-hydrALAZINE (BIDIL) 20-37.5 MG tablet Take 1 tablet by mouth 3 (three) times daily. 90 tablet 0  . labetalol (NORMODYNE) 200 MG tablet Take 1 tablet (200 mg total) by mouth 3 (three) times daily. 90 tablet 0  . lisinopril (PRINIVIL,ZESTRIL) 20 MG tablet Take 20 mg by mouth daily.      . Maltodextrin-Xanthan Gum ([employer]) POWD Per instructions to create nectar thick liquids (Patient not taking: Reported on 03/15/2015) 5 Can 0  . metFORMIN (GLUCOPHAGE) 500 MG tablet Take 500 mg by mouth 2 (two) times daily with a  meal.    . metroNIDAZOLE (FLAGYL) 500 MG tablet Take 1 tablet (500 mg total) by mouth 3 (three) times daily. 6 tablet 0  . Multiple Vitamins-Minerals (MULTIVITAMIN PO) Take 1 tablet by mouth daily as needed.    . nicotine (NICODERM CQ - DOSED IN MG/24 HOURS) 14 mg/24hr patch Place 1 patch (14 mg total) onto the skin daily. 28 patch 0  . nystatin (MYCOSTATIN) 100000 UNIT/ML suspension Take 5 mLs by mouth 4 (four) times daily. 7 day course starting on 03/13/15    . Omega-3 Fatty Acids (FISH OIL) 1000 MG CAPS Take 1,000 mg by mouth 3 (three) times daily.    . polyethylene glycol powder (MIRALAX) powder Take 17 g by mouth 2 (two) times daily. 850 g 0  . Potassium Chloride ER 20 MEQ TBCR Take 1 tablet by mouth daily.    . pravastatin (PRAVACHOL) 80 MG tablet Take 1 tablet (80 mg total) by mouth daily at 6 PM. 30 tablet 0  . senna (SENOKOT) 8.6 MG TABS tablet Take 2 tablets (17.2 mg total) by mouth daily. 120 each 0  . tamsulosin (FLOMAX) 0.4 MG CAPS capsule Take 0.4 mg by mouth daily after supper.       LABORATORY DATA:   Lab work from today:     Sodium 138, BUN 13, creatinine 0.99.    White count 9, hemoglobin 11.4.    REVIEW OF SYSTEMS:    GENERAL:  The patient does not have any specific complaints.   HEENT:   No headache.   CHEST/RESPIRATORY:  No shortness of breath.   CARDIAC:  No chest pain.   GI:   Absolutely no abdominal pain, diarrhea.   NEUROLOGICAL:  He is not complaining of lateralizing weakness.      PHYSICAL EXAMINATION:   GENERAL APPEARANCE:  The patient is not in any distress.               CHEST/RESPIRATORY:  Clear air entry bilaterally.    CARDIOVASCULAR:   CARDIAC:  No evidence of congestive heart failure.  Heart sounds are normal.  No S3.  JVP is not elevated.      GASTROINTESTINAL:   ABDOMEN:  No masses.    LIVER/SPLEEN/KIDNEYS:  No liver, no spleen.  No tenderness.    GENITOURINARY:   BLADDER:  No suprapubic or  costovertebral angle tenderness.     NEUROLOGICAL:     CRANIAL NERVES:   He does indeed have left facial weakness.  This is not new.   I noted this last week.  In fact, I spent some time trying to determine whether this was upper or lower motor neuron.  I think it is largely upper, although there appears to be some weakness on the frontalis muscle, as well.    ASSESSMENT/PLAN:                  Multi-infarct state.  I do not see any change in his neurologic exam.  I think sending him to the hospital was probably unnecessary.     Poorly controlled hypertension.   This seems under better control.    History of ischemic colitis.  His exam is completely benign.  White count and hemoglobin have normalized.  BUN and creatinine are stable.      CPT CODE: 84132

## 2015-03-24 ENCOUNTER — Ambulatory Visit (HOSPITAL_COMMUNITY): Payer: Medicare Other | Admitting: Speech Pathology

## 2015-03-24 ENCOUNTER — Other Ambulatory Visit (HOSPITAL_COMMUNITY): Payer: Medicare Other

## 2015-03-24 ENCOUNTER — Non-Acute Institutional Stay (SKILLED_NURSING_FACILITY): Payer: Medicare Other | Admitting: Internal Medicine

## 2015-03-24 DIAGNOSIS — I6320 Cerebral infarction due to unspecified occlusion or stenosis of unspecified precerebral arteries: Secondary | ICD-10-CM

## 2015-03-24 DIAGNOSIS — E1143 Type 2 diabetes mellitus with diabetic autonomic (poly)neuropathy: Secondary | ICD-10-CM | POA: Diagnosis not present

## 2015-03-26 ENCOUNTER — Non-Acute Institutional Stay (SKILLED_NURSING_FACILITY): Payer: Medicare Other | Admitting: Internal Medicine

## 2015-03-26 ENCOUNTER — Encounter (HOSPITAL_COMMUNITY)
Admission: AD | Admit: 2015-03-26 | Discharge: 2015-03-26 | Disposition: A | Discharge status: Home / Self Care | Payer: Medicare Other | Source: Skilled Nursing Facility | Attending: Internal Medicine | Admitting: Internal Medicine

## 2015-03-26 ENCOUNTER — Encounter (HOSPITAL_COMMUNITY): Payer: Medicare Other | Admitting: Speech Pathology

## 2015-03-26 ENCOUNTER — Encounter: Payer: Self-pay | Admitting: Internal Medicine

## 2015-03-26 DIAGNOSIS — I10 Essential (primary) hypertension: Secondary | ICD-10-CM

## 2015-03-26 DIAGNOSIS — K625 Hemorrhage of anus and rectum: Secondary | ICD-10-CM | POA: Diagnosis not present

## 2015-03-26 DIAGNOSIS — K559 Vascular disorder of intestine, unspecified: Secondary | ICD-10-CM

## 2015-03-26 DIAGNOSIS — D500 Iron deficiency anemia secondary to blood loss (chronic): Secondary | ICD-10-CM | POA: Insufficient documentation

## 2015-03-26 DIAGNOSIS — D649 Anemia, unspecified: Secondary | ICD-10-CM | POA: Diagnosis not present

## 2015-03-26 LAB — CBC WITH DIFFERENTIAL/PLATELET
Basophils Absolute: 0.1 10*3/uL (ref 0.0–0.1)
Basophils Relative: 1 %
Eosinophils Absolute: 0.3 10*3/uL (ref 0.0–0.7)
Eosinophils Relative: 3 %
HEMATOCRIT: 28.2 % — AB (ref 39.0–52.0)
Hemoglobin: 9.2 g/dL — ABNORMAL LOW (ref 13.0–17.0)
LYMPHS ABS: 2.3 10*3/uL (ref 0.7–4.0)
LYMPHS PCT: 25 %
MCH: 29 pg (ref 26.0–34.0)
MCHC: 32.6 g/dL (ref 30.0–36.0)
MCV: 89 fL (ref 78.0–100.0)
MONO ABS: 0.6 10*3/uL (ref 0.1–1.0)
Monocytes Relative: 7 %
NEUTROS ABS: 6.1 10*3/uL (ref 1.7–7.7)
Neutrophils Relative %: 64 %
Platelets: 367 10*3/uL (ref 150–400)
RBC: 3.17 MIL/uL — ABNORMAL LOW (ref 4.22–5.81)
RDW: 13.8 % (ref 11.5–15.5)
WBC: 9.4 10*3/uL (ref 4.0–10.5)

## 2015-03-26 LAB — BASIC METABOLIC PANEL
ANION GAP: 5 (ref 5–15)
BUN: 23 mg/dL — ABNORMAL HIGH (ref 6–20)
CHLORIDE: 106 mmol/L (ref 101–111)
CO2: 26 mmol/L (ref 22–32)
Calcium: 9 mg/dL (ref 8.9–10.3)
Creatinine, Ser: 0.88 mg/dL (ref 0.61–1.24)
GFR calc Af Amer: >60 mL/min (ref >60)
GLUCOSE: 79 mg/dL (ref 65–99)
POTASSIUM: 4.5 mmol/L (ref 3.5–5.1)
Sodium: 137 mmol/L (ref 135–145)

## 2015-03-26 NOTE — Progress Notes (Signed)
Patient ID: [name], male   DOB: 08-08-1934, 80 y.o.   MRN: [id number]                 PROGRESS NOTE  DATE:  03/26/2015           FACILITY: [facility]                    LEVEL OF CARE:   SNF   Acute Visit                    CHIEF COMPLAINT:  Acute visit secondary to follow-up anemia     HISTORY OF PRESENT ILLNESS:  [name] is a patientrecently admitted to the building.    He was felt to have abdominal pain secondary to constipation and ischemic colitis.  Discharged here on a seven-day course of ciprofloxacin and Flagyl.  From this regard, everything has been stable.   He has not been complaining of pain.    He also was felt to have a hypertensive urgency when he arrived in the hospital with a blood pressure of 206/77--this appears to have improved most recent blood pressure 141/59-Dr. Dellia Nims did see him recently and I believe there is some thought he may have orthostatic hypotension--has written orders to try to obtain orthostatic readings once a day--it appears these are still pending    there is quite a bit of variability here the lowest blood pressure I see listed as 102/50--it is 141/59 today.     Recently, the nurses here noted a left facial droop and sent him to the ER.  A CT scan of the head was negative.  Lab work showed an essentially normal comprehensive metabolic panel.   CBC:   White count 11.8, hemoglobin 11.9, normal differential.    The patient had a previous MRI of the brain on November 2nd.  This showed no acute or subacute infarction.  However, he does have stable remote infarcts involving the right cerebellum, left greater than right basal ganglia, brainstem, and right greater than left corona radiata.  Routine lab work arrived today which shows his hemoglobin has dropped a bit down to 9.2 appears previously it had been higher than that again it was 11.9 on lab done in the ER His baseline per chart review appears to be around 11 recently-I  did test him for occult blood and it was positive on the rectal exam performed today.  Clinically he appears to be stable there is some thought I believe that he has occasional altered consciousness episodes which might be seizures again this is being evaluated by Dr. Dellia Nims.  Currently he has no complaints she is sitting in his wheelchair comfortably nursing staff does not report any recent acute issues.  He does continue on aspirin and Plavix with a history of suspected CVA as well as coronary artery disease    Past Medical History  Diagnosis Date  . CAD (coronary artery disease)     multi vessel. s/p recent bare metal stenting of high grade proximal R CA stenosis. residual 70% : main in-stent restenosis, with distal filling of the left anterior descending from graft flow. patent bypass graft, except for 100% occlusion of saphernous vein graft - 1st diagonal branch. 3-vessel CABG 2000.  Marland Kitchen CAD (coronary artery disease)     s/p of 95% L main artery stenosis, 4/03: cutting balloon PTCA of 80% in-stent restenosis, 7/04. preserved L ventriculr function  . Carotid bruit  bilateral. no sigificant distal abd atherosclerosis by recent cath.   Marland Kitchen COPD (chronic obstructive pulmonary disease) (HCC)     ongoing tobacco   . DM2 (diabetes mellitus, type 2) ([place])   . Dyslipidemia   . HTN (hypertension)   . Glaucoma   . CVA (cerebral infarction)   . Dysphagia   . Constipation       Past Surgical History  Procedure Laterality Date  . Cardiac catheterization      Family medical social history reviewed per history and physical on 03/12/2015  Current Outpatient Prescriptions on File Prior to Visit  Medication Sig Dispense Refill  . ALPRAZolam (XANAX) 0.25 MG tablet Take one tablet by mouth three times daily for anxiety (Patient taking differently: Take 0.25 mg by mouth 3 (three) times daily. for anxiety) 90 tablet 5  . amLODipine (NORVASC) 5 MG tablet Take 5 mg by mouth daily.    Marland Kitchen aspirin 325 MG  tablet Take 325 mg by mouth daily.      . bisacodyl (DULCOLAX) 10 MG suppository Place 1 suppository (10 mg total) rectally daily as needed (no BM in 48 hours). 12 suppository 0  . Calcium Carbonate-Vitamin D (OS-CAL 500 + D PO) Take 1 tablet by mouth daily as needed.    . ciprofloxacin (CIPRO) 500 MG tablet Take 1 tablet (500 mg total) by mouth 2 (two) times daily. 4 tablet 0  . clopidogrel (PLAVIX) 75 MG tablet Take 75 mg by mouth daily.    Marland Kitchen docusate sodium (COLACE) 100 MG capsule Take 100 mg by mouth 2 (two) times daily.    . ferrous sulfate 325 (65 FE) MG tablet Take 325 mg by mouth daily with breakfast.    . furosemide (LASIX) 20 MG tablet Take 20 mg by mouth daily.    . isosorbide-hydrALAZINE (BIDIL) 20-37.5 MG tablet Take 1 tablet by mouth 3 (three) times daily. 90 tablet 0  . labetalol (NORMODYNE) 200 MG tablet Take 1 tablet (200 mg total) by mouth 3 (three) times daily. 90 tablet 0  . lisinopril (PRINIVIL,ZESTRIL) 20 MG tablet Take 20 mg by mouth daily.      . Maltodextrin-Xanthan Gum (RESOURCE THICKENUP CLEAR) POWD Per instructions to create nectar thick liquids (Patient not taking: Reported on 03/15/2015) 5 Can 0  . metFORMIN (GLUCOPHAGE) 500 MG tablet Take 500 mg by mouth 2 (two) times daily with a meal.    . metroNIDAZOLE (FLAGYL) 500 MG tablet Take 1 tablet (500 mg total) by mouth 3 (three) times daily. 6 tablet 0  . Multiple Vitamins-Minerals (MULTIVITAMIN PO) Take 1 tablet by mouth daily as needed.    . nicotine (NICODERM CQ - DOSED IN MG/24 HOURS) 14 mg/24hr patch Place 1 patch (14 mg total) onto the skin daily. 28 patch 0  . nystatin (MYCOSTATIN) 100000 UNIT/ML suspension Take 5 mLs by mouth 4 (four) times daily. 7 day course starting on 03/13/15    . Omega-3 Fatty Acids (FISH OIL) 1000 MG CAPS Take 1,000 mg by mouth 3 (three) times daily.    . polyethylene glycol powder (MIRALAX) powder Take 17 g by mouth 2 (two) times daily. 850 g 0  . Potassium Chloride ER 20 MEQ TBCR Take 1  tablet by mouth daily.    . pravastatin (PRAVACHOL) 80 MG tablet Take 1 tablet (80 mg total) by mouth daily at 6 PM. 30 tablet 0  . senna (SENOKOT) 8.6 MG TABS tablet Take 2 tablets (17.2 mg total) by mouth daily. 120 each 0  .  tamsulosin (FLOMAX) 0.4 MG CAPS capsule Take 0.4 mg by mouth daily after supper.       LABORATORY DATA:     03/26/2015.  WBC 9.4 hemoglobin 9.2 platelets 367.  Sodium 137 potassium 4.5 BUN 23 creatinine 0.88.  November 18-----  hemoglobin was 11.4.  :     Sodium 138, BUN 13, creatinine 0.99.    White count 9, hemoglobin 11.4.    REVIEW OF SYSTEMS:    GENERAL:  The patient does not have any specific complaints Skin does not complain of rashes or itching.   HEENT:   No headache. Appears to be legally blind does have some sight in his right eye apparently  CHEST/RESPIRATORY:  No shortness of breath.   CARDIAC:  No chest pain.   GI:    no abdominal pain, diarrhea.   NEUROLOGICAL:  He is not complaining of lateralizing weakness or dizziness-again apparently there is some thought he may have occasional seizure activity Psych-nursing does not report any issues appears to be in relatively good spirits.  .      PHYSICAL EXAMINATION:  Temperature 98.6 pulse 60 respirations 20 blood pressure 141/59  GENERAL APPEARANCE:  The patient is not in any distress sitting comfortably in his wheelchair.  His skin is warm and dry Eyes-apparently he is legally blind he does apparently have some vision in his right eye but has significant visual impairment.               CHEST/RESPIRATORY:  Clear air entry bilaterally.    CARDIOVASCULAR:   CARDIAC: .  Heart sounds are normal.  No S3.  JVP is not elevated.--Minimal lower extremity edema      GASTROINTESTINAL:   ABDOMEN: Soft nontender with positive bowel sounds    Musculoskeletal-appears to move his extremities at baseline he ambulates in a wheelchair Rectal-no hemorrhoids noted he did have Hemoccult positive stool per  digital exam        NEUROLOGICAL:    CRANIAL NERVES:   He does  have left facial weakness.  This is not new.   Otherwise appears to move his extremities at baseline Psych he appears grossly alert and oriented pleasant and appropriate   ASSESSMENT/PLAN:  Anemia-hemoglobin has dropped some this appears to be new finding from chart review-I do not see has a significant history of bleeding or colonoscopy from review of Epic --appears his baseline hemoglobin recently has run around 11.  He has had an occult positive stool-this was discussed with Dr. Dellia Nims via phone will start him on a proton pump inhibitor-at this point will continue the aspirin and Plavix secondary to his significant history of coronary artery disease as well as history of multi-infarcts CVA-again this is a challenging balancing situation will update lab work on Monday, November 28 to ensure stability clinically he appears to be doing all right                Multi-infarct state.  At this point appears to be at baseline he is on Plavix and aspirin     Poorly controlled hypertension.  This seems under better control although there is some concern about orthostatic hypotension this is followed by Dr. Dellia Nims at this point will monitor   History of ischemic colitis.   This appears to be resolved he is asymptomatic.      CPT CODE: 99310--of note greater than 35 minutes spent assessing patient-discussing his status with nursing staff-reviewing his chart in regards to anemia-and coordinating and formulating a plan  of care-of note greater than 50% of time spent coordinating plan of care with extensive chart review

## 2015-03-27 ENCOUNTER — Encounter (HOSPITAL_COMMUNITY): Payer: Self-pay | Admitting: Emergency Medicine

## 2015-03-27 ENCOUNTER — Emergency Department (HOSPITAL_COMMUNITY): Payer: Medicare Other

## 2015-03-27 ENCOUNTER — Observation Stay (HOSPITAL_COMMUNITY)
Admission: EM | Admit: 2015-03-27 | Discharge: 2015-03-28 | Disposition: A | Discharge status: Skilled Nursing Facility | Payer: Medicare Other | Attending: Family Medicine | Admitting: Family Medicine

## 2015-03-27 DIAGNOSIS — Z951 Presence of aortocoronary bypass graft: Secondary | ICD-10-CM | POA: Insufficient documentation

## 2015-03-27 DIAGNOSIS — R1310 Dysphagia, unspecified: Secondary | ICD-10-CM | POA: Insufficient documentation

## 2015-03-27 DIAGNOSIS — R531 Weakness: Secondary | ICD-10-CM | POA: Diagnosis present

## 2015-03-27 DIAGNOSIS — E875 Hyperkalemia: Principal | ICD-10-CM | POA: Diagnosis present

## 2015-03-27 DIAGNOSIS — Z955 Presence of coronary angioplasty implant and graft: Secondary | ICD-10-CM | POA: Diagnosis not present

## 2015-03-27 DIAGNOSIS — J449 Chronic obstructive pulmonary disease, unspecified: Secondary | ICD-10-CM | POA: Diagnosis not present

## 2015-03-27 DIAGNOSIS — D500 Iron deficiency anemia secondary to blood loss (chronic): Secondary | ICD-10-CM | POA: Diagnosis not present

## 2015-03-27 DIAGNOSIS — N179 Acute kidney failure, unspecified: Secondary | ICD-10-CM | POA: Diagnosis not present

## 2015-03-27 DIAGNOSIS — Z79899 Other long term (current) drug therapy: Secondary | ICD-10-CM | POA: Diagnosis not present

## 2015-03-27 DIAGNOSIS — Z7982 Long term (current) use of aspirin: Secondary | ICD-10-CM | POA: Insufficient documentation

## 2015-03-27 DIAGNOSIS — E1151 Type 2 diabetes mellitus with diabetic peripheral angiopathy without gangrene: Secondary | ICD-10-CM | POA: Insufficient documentation

## 2015-03-27 DIAGNOSIS — E119 Type 2 diabetes mellitus without complications: Secondary | ICD-10-CM

## 2015-03-27 DIAGNOSIS — I10 Essential (primary) hypertension: Secondary | ICD-10-CM | POA: Diagnosis not present

## 2015-03-27 DIAGNOSIS — E860 Dehydration: Secondary | ICD-10-CM | POA: Diagnosis not present

## 2015-03-27 DIAGNOSIS — Z87891 Personal history of nicotine dependence: Secondary | ICD-10-CM | POA: Diagnosis not present

## 2015-03-27 DIAGNOSIS — H409 Unspecified glaucoma: Secondary | ICD-10-CM | POA: Insufficient documentation

## 2015-03-27 DIAGNOSIS — I2510 Atherosclerotic heart disease of native coronary artery without angina pectoris: Secondary | ICD-10-CM | POA: Insufficient documentation

## 2015-03-27 DIAGNOSIS — H5442 Blindness, left eye, normal vision right eye: Secondary | ICD-10-CM | POA: Diagnosis not present

## 2015-03-27 DIAGNOSIS — E785 Hyperlipidemia, unspecified: Secondary | ICD-10-CM | POA: Diagnosis not present

## 2015-03-27 DIAGNOSIS — Z8673 Personal history of transient ischemic attack (TIA), and cerebral infarction without residual deficits: Secondary | ICD-10-CM | POA: Diagnosis not present

## 2015-03-27 DIAGNOSIS — Z7984 Long term (current) use of oral hypoglycemic drugs: Secondary | ICD-10-CM | POA: Diagnosis not present

## 2015-03-27 DIAGNOSIS — Z7902 Long term (current) use of antithrombotics/antiplatelets: Secondary | ICD-10-CM | POA: Insufficient documentation

## 2015-03-27 LAB — BASIC METABOLIC PANEL
Anion gap: 8 (ref 5–15)
BUN: 21 mg/dL — AB (ref 6–20)
CHLORIDE: 106 mmol/L (ref 101–111)
CO2: 22 mmol/L (ref 22–32)
CREATININE: 1.37 mg/dL — AB (ref 0.61–1.24)
Calcium: 9.6 mg/dL (ref 8.9–10.3)
GFR calc Af Amer: 55 mL/min — ABNORMAL LOW (ref >60)
GFR calc non Af Amer: 48 mL/min — ABNORMAL LOW (ref >60)
Glucose, Bld: 125 mg/dL — ABNORMAL HIGH (ref 65–99)
Potassium: 6.2 mmol/L (ref 3.5–5.1)
Sodium: 136 mmol/L (ref 135–145)

## 2015-03-27 LAB — CBG MONITORING, ED: GLUCOSE-CAPILLARY: 76 mg/dL (ref 65–99)

## 2015-03-27 LAB — CBC
HCT: 31 % — ABNORMAL LOW (ref 39.0–52.0)
Hemoglobin: 10 g/dL — ABNORMAL LOW (ref 13.0–17.0)
MCH: 28.7 pg (ref 26.0–34.0)
MCHC: 32.3 g/dL (ref 30.0–36.0)
MCV: 88.8 fL (ref 78.0–100.0)
Platelets: 354 10*3/uL (ref 150–400)
RBC: 3.49 MIL/uL — ABNORMAL LOW (ref 4.22–5.81)
RDW: 13.8 % (ref 11.5–15.5)
WBC: 12 10*3/uL — ABNORMAL HIGH (ref 4.0–10.5)

## 2015-03-27 MED ORDER — CLOPIDOGREL BISULFATE 75 MG PO TABS
75.0000 mg | ORAL_TABLET | Freq: Every day | ORAL | Status: DC
  Administered 2015-03-28: 75 mg via ORAL
  Filled 2015-03-27: qty 1

## 2015-03-27 MED ORDER — ACETAMINOPHEN 650 MG RE SUPP: 650.0000 mg | Freq: Four times a day (QID) | RECTAL | Status: DC | PRN

## 2015-03-27 MED ORDER — PRAVASTATIN SODIUM 40 MG PO TABS: 80.0000 mg | ORAL_TABLET | Freq: Every day | ORAL | Status: DC

## 2015-03-27 MED ORDER — ACETAMINOPHEN 325 MG PO TABS: 650.0000 mg | ORAL_TABLET | Freq: Four times a day (QID) | ORAL | Status: DC | PRN

## 2015-03-27 MED ORDER — METFORMIN HCL 500 MG PO TABS
500.0000 mg | ORAL_TABLET | Freq: Two times a day (BID) | ORAL | Status: DC
  Administered 2015-03-28: 500 mg via ORAL
  Filled 2015-03-27: qty 1

## 2015-03-27 MED ORDER — ONDANSETRON HCL 4 MG PO TABS: 4.0000 mg | ORAL_TABLET | Freq: Four times a day (QID) | ORAL | Status: DC | PRN

## 2015-03-27 MED ORDER — RESOURCE THICKENUP CLEAR PO POWD
ORAL | Status: DC | PRN
  Filled 2015-03-27: qty 125

## 2015-03-27 MED ORDER — SODIUM CHLORIDE 0.9 % IV SOLN
INTRAVENOUS | Status: AC
  Administered 2015-03-28 (×2): via INTRAVENOUS

## 2015-03-27 MED ORDER — LABETALOL HCL 200 MG PO TABS
200.0000 mg | ORAL_TABLET | Freq: Three times a day (TID) | ORAL | Status: DC
  Administered 2015-03-28 (×2): 200 mg via ORAL
  Filled 2015-03-27 (×2): qty 1

## 2015-03-27 MED ORDER — ASPIRIN 325 MG PO TABS
325.0000 mg | ORAL_TABLET | Freq: Every day | ORAL | Status: DC
  Administered 2015-03-28: 325 mg via ORAL
  Filled 2015-03-27: qty 1

## 2015-03-27 MED ORDER — ENOXAPARIN SODIUM 40 MG/0.4ML ~~LOC~~ SOLN
40.0000 mg | SUBCUTANEOUS | Status: DC
  Administered 2015-03-28: 40 mg via SUBCUTANEOUS
  Filled 2015-03-27: qty 0.4

## 2015-03-27 MED ORDER — FUROSEMIDE 20 MG PO TABS: 20.0000 mg | ORAL_TABLET | Freq: Every day | ORAL | Status: DC

## 2015-03-27 MED ORDER — DOCUSATE SODIUM 100 MG PO CAPS
100.0000 mg | ORAL_CAPSULE | Freq: Two times a day (BID) | ORAL | Status: DC
  Administered 2015-03-27: 100 mg via ORAL
  Filled 2015-03-27: qty 1

## 2015-03-27 MED ORDER — SODIUM CHLORIDE 0.9 % IJ SOLN
3.0000 mL | Freq: Two times a day (BID) | INTRAMUSCULAR | Status: DC
  Administered 2015-03-27 – 2015-03-28 (×2): 3 mL via INTRAVENOUS

## 2015-03-27 MED ORDER — LISINOPRIL 20 MG PO TABS
20.0000 mg | ORAL_TABLET | Freq: Every day | ORAL | Status: DC
  Administered 2015-03-28: 20 mg via ORAL
  Filled 2015-03-27: qty 1

## 2015-03-27 MED ORDER — FERROUS SULFATE 325 (65 FE) MG PO TABS
325.0000 mg | ORAL_TABLET | Freq: Every day | ORAL | Status: DC
  Administered 2015-03-28: 325 mg via ORAL
  Filled 2015-03-27: qty 1

## 2015-03-27 MED ORDER — SODIUM CHLORIDE 0.9 % IV BOLUS (SEPSIS)
500.0000 mL | Freq: Once | INTRAVENOUS | Status: AC
  Administered 2015-03-27: 500 mL via INTRAVENOUS

## 2015-03-27 MED ORDER — ISOSORB DINITRATE-HYDRALAZINE 20-37.5 MG PO TABS
1.0000 | ORAL_TABLET | Freq: Three times a day (TID) | ORAL | Status: DC
  Administered 2015-03-28 (×2): 1 via ORAL
  Filled 2015-03-27 (×2): qty 1

## 2015-03-27 MED ORDER — AMLODIPINE BESYLATE 5 MG PO TABS
5.0000 mg | ORAL_TABLET | Freq: Every day | ORAL | Status: DC
  Administered 2015-03-28: 5 mg via ORAL
  Filled 2015-03-27: qty 1

## 2015-03-27 MED ORDER — ALPRAZOLAM 0.25 MG PO TABS
0.2500 mg | ORAL_TABLET | Freq: Three times a day (TID) | ORAL | Status: DC
  Administered 2015-03-28 (×2): 0.25 mg via ORAL
  Filled 2015-03-27 (×2): qty 1

## 2015-03-27 MED ORDER — TAMSULOSIN HCL 0.4 MG PO CAPS: 0.4000 mg | ORAL_CAPSULE | Freq: Every day | ORAL | Status: DC

## 2015-03-27 MED ORDER — SENNA 8.6 MG PO TABS
2.0000 | ORAL_TABLET | Freq: Every day | ORAL | Status: DC
  Administered 2015-03-28: 17.2 mg via ORAL
  Filled 2015-03-27: qty 2

## 2015-03-27 MED ORDER — ONDANSETRON HCL 4 MG/2ML IJ SOLN: 4.0000 mg | Freq: Four times a day (QID) | INTRAMUSCULAR | Status: DC | PRN

## 2015-03-27 NOTE — ED Notes (Signed)
Pt via [place] with daughter reporting concern for additional stroke with hx of the same.  EMS reports difficult to tell new deficits from old.  Hx left sided weakness, left facial droop, and slurred speech.  Blindness in left eye from DM.  Pt reports feeling increased generalized weakness x 2 weeks.  Normally resides at [facility].  Pt NAD, A&O.

## 2015-03-27 NOTE — H&P (Signed)
[facility] Admission History and Physical Service Pager: [phone]  Patient name: [name] Medical record number: [id number] Date of birth: August 27, 1934 Age: 80 y.o. Gender: male  Primary Care Provider: Neale Burly, MD Consultants: none Code Status: Full  Chief Complaint: weak  Assessment and Plan: [name] is a 80 y.o. male presenting with weakness and hyperkalemia. PMH is significant for CVA, CAD s/p CABG 2000 and stenting, HTN, T2DM, COPD, HLD, tobacco use, blind in left eye (glaucoma).  # Hyperkalemia and dehydration: K+ 6.2 on admission. On lasix with 57mq potassium daily at home. He has a SCr of 1.37 which is up from baseline 0.9-1.0. EKG largely unchanged from earlier this month (T waves do appear a little more prominent but not peaked). He appears clinically dehydrated. Echo from 03/06/2015 was reviewed with EF 80%/G1DD. Would favor this as causing his weakness as well. ED did perform MRI brain for concern of possible repeat/worsening stroke which was unchanged  - telemetry overnight - 500cc NS bolus then 150cc/hr - re-check BMP in AM - SW consult for SNF placement (believed to have come from SNF)  # HTN: elevated up to 183/60 in ED - continue home amlodipine 5, lisinopril 20, bidil 20-37.5 TID, labetalol 200 TID  # T2DM: most recent A1c 6.2 on 03/06/15.  - will hold off on SSI given good control  # COPD: stable respiratory status, medication rec does not show any albuterol/controller medications - monitor clinically, add albuterol as needed  # HLD: stable, lipid panel 03/06/15 very well controlled TC 131, LDL 81, HDL 43. - continue pravastatin (would consider switching given CAD and T2DM diagnoses, address with patient)  # Dysphagia: evaluated by SLP at prior hospitalization with recommended dys 2 diet and nectar thick liquids, not clear if he was advanced at this time - SLP therapy  # Benzodiazepine use: came in on xanax TID, no  clear indication in problem list/history.  - will continue home xanax - address why this is prescribed, would favor titrating off in an elderly frail gentleman.   FEN/GI: 500cc NS bolus, 150cc/hr x 12 hours / dysphagia 2/nectar thick pending SLP Prophylaxis: lovenox  Disposition: observation overnight/rehydration  History of Present Illness:  Darin SEDGWICKis a 80y.o. male presenting with weakness and found to be hyperkalemic. Patient is alone in exam room. He answers most questions but has difficulty remembering details. He says that he was brought to the ED for being weak, which he says has been going on for a little while for him. He does not complain of pain currently but says that he had some abdominal pain earlier. He denies any headache, fevers, chest pains, shortness of breath, dysuria. He says he has not been drinking much fluids, and didn't eat anything today. He has been urinating about the same.  Of note he was recently admitted for abdominal pain/constipation/weakness earlier this month deemed to be constipation and possible ischemic colitis. Also had hypertensive emergency, and elevated troponin to 0.97.  Review Of Systems: Per HPI with the following additions: no joint pain, no palpitations Otherwise the remainder of the systems were negative.  Patient Active Problem List   Diagnosis Date Noted  . Hyperkalemia 03/27/2015  . Anemia due to chronic blood loss 03/26/2015  . Dysphagia 03/09/2015  . Colitis, ischemic ([place] 03/07/2015  . Facial droop 03/07/2015  . Malignant hypertension 03/06/2015  . Constipation 03/06/2015  . Abdominal pain 03/06/2015  . Sepsis ([place] 03/06/2015  . Anxiety  03/06/2015  . AKI (acute kidney injury) ([place]) 03/06/2015  . Elevated troponin 03/06/2015  . Diabetes mellitus without complication ([place]) 24/40/1027  . Abnormal EKG   . COPD (chronic obstructive pulmonary disease) ([place]) 11/18/2009  . HLD (hyperlipidemia) 11/18/2009  . NONDEPENDENT TOBACCO  USE DISORDER 11/18/2009  . HYPERTENSION 11/18/2009  . INTERMEDIATE CORONARY SYNDROME 11/18/2009  . Coronary atherosclerosis 11/18/2009  . PVD 11/18/2009  . CHRONIC AIRWAY OBSTRUCTION NEC 11/18/2009  . PERSONAL HX TIA & CI W/O RESIDUAL DEFICITS 11/18/2009  . POSTSURGICAL AORTOCORONARY BYPASS STATUS 11/18/2009  . POSTSURG PERCUT TRANSLUMINAL COR ANGPLSTY STS 11/18/2009    Past Medical History: Past Medical History  Diagnosis Date  . CAD (coronary artery disease)     multi vessel. s/p recent bare metal stenting of high grade proximal R CA stenosis. residual 70% : main in-stent restenosis, with distal filling of the left anterior descending from graft flow. patent bypass graft, except for 100% occlusion of saphernous vein graft - 1st diagonal branch. 3-vessel CABG 2000.  Marland Kitchen CAD (coronary artery disease)     s/p of 95% L main artery stenosis, 4/03: cutting balloon PTCA of 80% in-stent restenosis, 7/04. preserved L ventriculr function  . Carotid bruit     bilateral. no sigificant distal abd atherosclerosis by recent cath.   Marland Kitchen COPD (chronic obstructive pulmonary disease) (HCC)     ongoing tobacco   . DM2 (diabetes mellitus, type 2) ([place])   . Dyslipidemia   . HTN (hypertension)   . Glaucoma   . CVA (cerebral infarction)   . Dysphagia   . Constipation     Past Surgical History: Past Surgical History  Procedure Laterality Date  . Cardiac catheterization      Social History: Social History  Substance Use Topics  . Smoking status: Former Smoker    Quit date: 08/03/2014  . Smokeless tobacco: None     Comment: 50 pack year hx   . Alcohol Use: No     Comment: has not had alcohol in 30-40 years    Additional social history: moved to nursing home? (unable to state which one) approx 2-3 months ago Please also refer to relevant sections of EMR.  Family History: Family History  Problem Relation Age of Onset  . Hypertension Mother    Allergies and Medications: No Known Allergies No  current facility-administered medications on file prior to encounter.   Current Outpatient Prescriptions on File Prior to Encounter  Medication Sig Dispense Refill  . ALPRAZolam (XANAX) 0.25 MG tablet Take one tablet by mouth three times daily for anxiety (Patient taking differently: Take 0.25 mg by mouth 3 (three) times daily. for anxiety) 90 tablet 5  . amLODipine (NORVASC) 5 MG tablet Take 5 mg by mouth daily.    Marland Kitchen aspirin 325 MG tablet Take 325 mg by mouth daily.      . clopidogrel (PLAVIX) 75 MG tablet Take 75 mg by mouth daily.    Marland Kitchen docusate sodium (COLACE) 100 MG capsule Take 100 mg by mouth 2 (two) times daily.    . ferrous sulfate 325 (65 FE) MG tablet Take 325 mg by mouth daily with breakfast.    . furosemide (LASIX) 20 MG tablet Take 20 mg by mouth daily.    . isosorbide-hydrALAZINE (BIDIL) 20-37.5 MG tablet Take 1 tablet by mouth 3 (three) times daily. 90 tablet 0  . labetalol (NORMODYNE) 200 MG tablet Take 1 tablet (200 mg total) by mouth 3 (three) times daily. 90 tablet 0  . lisinopril (PRINIVIL,ZESTRIL)  20 MG tablet Take 20 mg by mouth daily.      . metFORMIN (GLUCOPHAGE) 500 MG tablet Take 500 mg by mouth 2 (two) times daily with a meal.    . nicotine (NICODERM CQ - DOSED IN MG/24 HOURS) 14 mg/24hr patch Place 1 patch (14 mg total) onto the skin daily. 28 patch 0  . Omega-3 Fatty Acids (FISH OIL) 1000 MG CAPS Take 1,000 mg by mouth 3 (three) times daily.    . polyethylene glycol powder (MIRALAX) powder Take 17 g by mouth 2 (two) times daily. 850 g 0  . Potassium Chloride ER 20 MEQ TBCR Take 1 tablet by mouth daily.    . pravastatin (PRAVACHOL) 80 MG tablet Take 1 tablet (80 mg total) by mouth daily at 6 PM. 30 tablet 0  . senna (SENOKOT) 8.6 MG TABS tablet Take 2 tablets (17.2 mg total) by mouth daily. 120 each 0  . tamsulosin (FLOMAX) 0.4 MG CAPS capsule Take 0.4 mg by mouth daily after supper.    . bisacodyl (DULCOLAX) 10 MG suppository Place 1 suppository (10 mg total)  rectally daily as needed (no BM in 48 hours). 12 suppository 0  . Maltodextrin-Xanthan Gum ([place]) POWD Per instructions to create nectar thick liquids (Patient not taking: Reported on 03/15/2015) 5 Can 0    Objective: BP 156/57 mmHg  Pulse 58  Temp(Src) 98 F (36.7 C) (Oral)  Resp 17  SpO2 100% Exam: General: elderly appearing gentleman laying in bed Eyes: left cataract. Right pupil round, reactive to light. Arcus senilis present. ENTM: dry and tacky oral mucosa with thick saliva. Geographic tongue. No oropharyngeal lesions Neck: supple Cardiovascular: RRR, normal heart sounds, systolic murmur present. 2+ radial, PT pulses bilaterally.  Respiratory: clear to auscultation bilaterally, normal effort Abdomen: soft, thin, nontender, nondistended, normal bowel sounds MSK: no LE edema. There is some apparent muscle twitching of his right trapezius at rest. Skin: frail with scattered ecchymosis Neuro: alert and oriented to person/place/time/president. Follows commands appropriately. He has left sided facial droop. Strength in LUE 4/5 and LLE 4/5, right side is 5/5 Psych: normal thought content, speech is coherent.  Labs and Imaging: CBC BMET   Recent Labs Lab 03/27/15 1759  WBC 12.0*  HGB 10.0*  HCT 31.0*  PLT 354    Recent Labs Lab 03/27/15 1759  NA 136  K 6.2*  CL 106  CO2 22  BUN 21*  CREATININE 1.37*  GLUCOSE 125*  CALCIUM 9.6     Mr Brain Wo Contrast  03/27/2015  CLINICAL DATA:  Left facial droop. Left-sided weakness and slurred speech. Prior stroke with residual deficits. EXAM: MRI HEAD WITHOUT CONTRAST TECHNIQUE: Multiplanar, multiecho pulse sequences of the brain and surrounding structures were obtained without intravenous contrast. COMPARISON:  Head CT 03/15/2015 and MRI 03/05/2015 FINDINGS: There is no evidence of acute infarct, mass, midline shift, or extra-axial fluid collection. A chronic infarct is again seen in the left basal ganglia of  with a small amount of associated chronic blood products as well as ex vacuo dilatation of the left frontal horn. A small, chronic infarct in the right pons is unchanged, as are cortical and subcortical infarcts in the right frontal, right parietal, and right occipital lobes. Moderate global cerebral atrophy is unchanged. Chronic small vessel ischemic disease is again noted in the subcortical and deep cerebral white matter bilaterally. Left phthisis bulbi is noted. Paranasal sinuses and mastoid air cells are clear. Abnormal distal right vertebral artery flow void suggestive of  occlusion is unchanged, as is a mildly abnormal right internal carotid artery flow void. IMPRESSION: 1. No acute intracranial abnormality. 2. Stable chronic infarcts. Electronically Signed   By: Logan Bores M.D.   On: 03/27/2015 19:48    Leone Brand, MD 03/27/2015, 10:28 PM PGY-3, [place] [facility] pager: [phone], text pages welcome

## 2015-03-27 NOTE — ED Notes (Signed)
Patient transported to MRI 

## 2015-03-27 NOTE — ED Provider Notes (Signed)
CSN: [id number]     Arrival date & time 03/27/15  1701 History   First MD Initiated Contact with Patient 03/27/15 1717     Chief Complaint  Patient presents with  . Weakness     (Consider location/radiation/quality/duration/timing/severity/associated sxs/prior Treatment) HPI Comments: Patient is a 80 year old male with history of prior CVA, coronary artery disease, diabetes, hypertension. He is brought by EMS from his daughter's house for evaluation of weakness. According to the family, he has had a more prominent facial droop and increased weakness for the past several days. He was seen at [facility] approximately 2 weeks ago and had a negative head CT. While having Thanksgiving dinner, he apparently had some sort of unresponsive episode and was brought here by EMS for further evaluation. The patient is a difficult historian.  Patient is a 80 y.o. male presenting with weakness. The history is provided by the patient.  Weakness This is a new problem. The problem occurs constantly. The problem has been gradually worsening. Pertinent negatives include no chest pain, no abdominal pain and no headaches. Nothing aggravates the symptoms. Nothing relieves the symptoms. He has tried nothing for the symptoms. The treatment provided no relief.    Past Medical History  Diagnosis Date  . CAD (coronary artery disease)     multi vessel. s/p recent bare metal stenting of high grade proximal R CA stenosis. residual 70% : main in-stent restenosis, with distal filling of the left anterior descending from graft flow. patent bypass graft, except for 100% occlusion of saphernous vein graft - 1st diagonal branch. 3-vessel CABG 2000.  Marland Kitchen CAD (coronary artery disease)     s/p of 95% L main artery stenosis, 4/03: cutting balloon PTCA of 80% in-stent restenosis, 7/04. preserved L ventriculr function  . Carotid bruit     bilateral. no sigificant distal abd atherosclerosis by recent cath.   Marland Kitchen COPD (chronic  obstructive pulmonary disease) (HCC)     ongoing tobacco   . DM2 (diabetes mellitus, type 2) ([place])   . Dyslipidemia   . HTN (hypertension)   . Glaucoma   . CVA (cerebral infarction)   . Dysphagia   . Constipation    Past Surgical History  Procedure Laterality Date  . Cardiac catheterization     Family History  Problem Relation Age of Onset  . Hypertension Mother    Social History  Substance Use Topics  . Smoking status: Former Smoker    Quit date: 08/03/2014  . Smokeless tobacco: None     Comment: 50 pack year hx   . Alcohol Use: No     Comment: has not had alcohol in 30-40 years     Review of Systems  Cardiovascular: Negative for chest pain.  Gastrointestinal: Negative for abdominal pain.  Neurological: Positive for weakness. Negative for headaches.  All other systems reviewed and are negative.     Allergies  Review of patient's allergies indicates no known allergies.  Home Medications   Prior to Admission medications   Medication Sig Start Date End Date Taking? Authorizing Provider  ALPRAZolam Duanne Moron) 0.25 MG tablet Take one tablet by mouth three times daily for anxiety Patient taking differently: Take 0.25 mg by mouth 3 (three) times daily. for anxiety 03/11/15   Lauree Chandler, NP  amLODipine (NORVASC) 5 MG tablet Take 5 mg by mouth daily.    Historical Provider, MD  aspirin 325 MG tablet Take 325 mg by mouth daily.      Historical Provider, MD  bisacodyl (  DULCOLAX) 10 MG suppository Place 1 suppository (10 mg total) rectally daily as needed (no BM in 48 hours). 03/10/15   Janece Canterbury, MD  Calcium Carbonate-Vitamin D (OS-CAL 500 + D PO) Take 1 tablet by mouth daily as needed.    Historical Provider, MD  clopidogrel (PLAVIX) 75 MG tablet Take 75 mg by mouth daily.    Historical Provider, MD  docusate sodium (COLACE) 100 MG capsule Take 100 mg by mouth 2 (two) times daily.    Historical Provider, MD  ferrous sulfate 325 (65 FE) MG tablet Take 325 mg by mouth  daily with breakfast.    Historical Provider, MD  furosemide (LASIX) 20 MG tablet Take 20 mg by mouth daily.    Historical Provider, MD  isosorbide-hydrALAZINE (BIDIL) 20-37.5 MG tablet Take 1 tablet by mouth 3 (three) times daily. 03/10/15   Janece Canterbury, MD  labetalol (NORMODYNE) 200 MG tablet Take 1 tablet (200 mg total) by mouth 3 (three) times daily. 03/10/15   Janece Canterbury, MD  lisinopril (PRINIVIL,ZESTRIL) 20 MG tablet Take 20 mg by mouth daily.      Historical Provider, MD  Maltodextrin-Xanthan Gum ([place]) POWD Per instructions to create nectar thick liquids Patient not taking: Reported on 03/15/2015 03/10/15   Janece Canterbury, MD  metFORMIN (GLUCOPHAGE) 500 MG tablet Take 500 mg by mouth 2 (two) times daily with a meal.    Historical Provider, MD  Multiple Vitamins-Minerals (MULTIVITAMIN PO) Take 1 tablet by mouth daily as needed.    Historical Provider, MD  nicotine (NICODERM CQ - DOSED IN MG/24 HOURS) 14 mg/24hr patch Place 1 patch (14 mg total) onto the skin daily. 03/10/15   Janece Canterbury, MD  nystatin (MYCOSTATIN) 100000 UNIT/ML suspension Take 5 mLs by mouth 4 (four) times daily. 7 day course starting on 03/13/15    Historical Provider, MD  Omega-3 Fatty Acids (FISH OIL) 1000 MG CAPS Take 1,000 mg by mouth 3 (three) times daily.    Historical Provider, MD  omeprazole (PRILOSEC) 20 MG capsule Take 20 mg by mouth daily.    Historical Provider, MD  polyethylene glycol powder (MIRALAX) powder Take 17 g by mouth 2 (two) times daily. 03/10/15   Janece Canterbury, MD  Potassium Chloride ER 20 MEQ TBCR Take 1 tablet by mouth daily.    Historical Provider, MD  pravastatin (PRAVACHOL) 80 MG tablet Take 1 tablet (80 mg total) by mouth daily at 6 PM. 03/10/15   Janece Canterbury, MD  senna (SENOKOT) 8.6 MG TABS tablet Take 2 tablets (17.2 mg total) by mouth daily. 03/10/15   Janece Canterbury, MD  tamsulosin (FLOMAX) 0.4 MG CAPS capsule Take 0.4 mg by mouth daily after supper.     Historical Provider, MD   BP 111/53 mmHg  Pulse 62  Temp(Src) 98 F (36.7 C) (Oral)  Resp 14  SpO2 100% Physical Exam  Constitutional: He is oriented to person, place, and time. He appears well-developed and well-nourished. No distress.  HENT:  Head: Normocephalic and atraumatic.  Eyes:  The left eye is atrophic. The right pupil is reactive.  Neck: Normal range of motion. Neck supple.  Cardiovascular: Normal rate, regular rhythm and normal heart sounds.   No murmur heard. Pulmonary/Chest: Effort normal and breath sounds normal. No respiratory distress. He has no wheezes.  Abdominal: Soft. Bowel sounds are normal. He exhibits no distension. There is no tenderness.  Musculoskeletal: Normal range of motion. He exhibits no edema.  Neurological: He is alert and oriented to person, place,  and time. He exhibits normal muscle tone. Coordination normal.  There is a left-sided facial droop noted. From his prior doctors visits, this has been well documented.  Skin: Skin is warm and dry. He is not diaphoretic.  Nursing note and vitals reviewed.   ED Course  Procedures (including critical care time) Labs Review Labs Reviewed  BASIC METABOLIC PANEL  CBC  URINALYSIS, ROUTINE W REFLEX MICROSCOPIC (NOT AT [facility])  CBG MONITORING, ED    Imaging Review No results found. I have personally reviewed and evaluated these images and lab results as part of my medical decision-making.   EKG Interpretation   Date/Time:  Thursday March 27 2015 17:16:18 EST Ventricular Rate:  62 PR Interval:  151 QRS Duration: 92 QT Interval:  406 QTC Calculation: 412 R Axis:   57 Text Interpretation:  Sinus rhythm Probable LVH with secondary repol abnrm  Abnormal T, probable ischemia, lateral leads Anterior ST elevation,  probably due to LVH Confirmed by Keatyn Jawad  MD, Landrum Carbonell ([id number]) on 03/27/2015  5:41:45 PM      MDM   Final diagnoses:  None    Patient presents with weakness and left-sided facial  droop. Family is concerned that he may have had a stroke. He went for an MRI which was negative, however his laboratory studies do reveal a potassium of 6.2 and worsening renal function. He will be admitted for correction of his potassium and hydration.    Veryl Speak, MD 03/27/15 [id number]

## 2015-03-27 NOTE — ED Notes (Signed)
CBG 76. Nurse notified.

## 2015-03-27 NOTE — ED Notes (Signed)
Notified Dr Stark Jock of critical potassium of 6.2.  Pt in MRI.

## 2015-03-27 NOTE — ED Notes (Signed)
Pt. Notified of need for urine.

## 2015-03-28 ENCOUNTER — Inpatient Hospital Stay
Admission: RE | Admit: 2015-03-28 | Discharge: 2015-04-05 | Disposition: A | Discharge status: Other Acute Inpatient Hospital | Payer: Medicare Other | Source: Ambulatory Visit | Attending: Internal Medicine | Admitting: Internal Medicine

## 2015-03-28 ENCOUNTER — Observation Stay (HOSPITAL_COMMUNITY): Payer: Medicare Other

## 2015-03-28 DIAGNOSIS — R059 Cough, unspecified: Principal | ICD-10-CM

## 2015-03-28 DIAGNOSIS — E119 Type 2 diabetes mellitus without complications: Secondary | ICD-10-CM | POA: Diagnosis not present

## 2015-03-28 DIAGNOSIS — I10 Essential (primary) hypertension: Secondary | ICD-10-CM

## 2015-03-28 DIAGNOSIS — R1310 Dysphagia, unspecified: Secondary | ICD-10-CM

## 2015-03-28 DIAGNOSIS — E875 Hyperkalemia: Principal | ICD-10-CM

## 2015-03-28 DIAGNOSIS — R05 Cough: Principal | ICD-10-CM

## 2015-03-28 LAB — URINALYSIS, ROUTINE W REFLEX MICROSCOPIC
BILIRUBIN URINE: NEGATIVE
Glucose, UA: NEGATIVE mg/dL
HGB URINE DIPSTICK: NEGATIVE
KETONES UR: 15 mg/dL — AB
Leukocytes, UA: NEGATIVE
NITRITE: NEGATIVE
PH: 6 (ref 5.0–8.0)
Protein, ur: NEGATIVE mg/dL
SPECIFIC GRAVITY, URINE: 1.02 (ref 1.005–1.030)

## 2015-03-28 LAB — BASIC METABOLIC PANEL
Anion gap: 8 (ref 5–15)
BUN: 22 mg/dL — AB (ref 6–20)
CHLORIDE: 106 mmol/L (ref 101–111)
CO2: 24 mmol/L (ref 22–32)
Calcium: 9 mg/dL (ref 8.9–10.3)
Creatinine, Ser: 1.1 mg/dL (ref 0.61–1.24)
GFR calc Af Amer: >60 mL/min (ref >60)
GFR calc non Af Amer: >60 mL/min (ref >60)
GLUCOSE: 104 mg/dL — AB (ref 65–99)
POTASSIUM: 4.4 mmol/L (ref 3.5–5.1)
SODIUM: 138 mmol/L (ref 135–145)

## 2015-03-28 MED ORDER — CHLORHEXIDINE GLUCONATE 0.12 % MT SOLN
15.0000 mL | Freq: Two times a day (BID) | OROMUCOSAL | Status: DC
  Administered 2015-03-28: 15 mL via OROMUCOSAL

## 2015-03-28 MED ORDER — CETYLPYRIDINIUM CHLORIDE 0.05 % MT LIQD
7.0000 mL | Freq: Two times a day (BID) | OROMUCOSAL | Status: DC
  Administered 2015-03-28: 7 mL via OROMUCOSAL

## 2015-03-28 NOTE — Progress Notes (Signed)
Attempted to call [facility] 3x, no answer.

## 2015-03-28 NOTE — Discharge Summary (Signed)
[facility] Discharge Summary  Patient name: [name] Medical record number: [id number] Date of birth: [birth year] Age: 80 y.o. Gender: male Date of Admission: 03/27/2015  Date of Discharge: 03/28/2015  Admitting Physician: Dickie La, MD  Primary Care Provider: Neale Burly, MD Consultants: None  Indication for Hospitalization:  Hyperkalemia, dehydration  Discharge Diagnoses/Problem List:  Patient Active Problem List   Diagnosis Date Noted  . Hyperkalemia 03/27/2015  . Anemia due to chronic blood loss 03/26/2015  . Dysphagia 03/09/2015  . Colitis, ischemic ([place]) 03/07/2015  . Facial droop 03/07/2015  . Malignant hypertension 03/06/2015  . Constipation 03/06/2015  . Abdominal pain 03/06/2015  . Sepsis ([place]) 03/06/2015  . Anxiety 03/06/2015  . AKI (acute kidney injury) ([place]) 03/06/2015  . Elevated troponin 03/06/2015  . Diabetes mellitus without complication ([place]) 92/03/9416  . Abnormal EKG   . COPD (chronic obstructive pulmonary disease) ([place]) 11/18/2009  . HLD (hyperlipidemia) 11/18/2009  . NONDEPENDENT TOBACCO USE DISORDER 11/18/2009  . Essential hypertension 11/18/2009  . INTERMEDIATE CORONARY SYNDROME 11/18/2009  . Coronary atherosclerosis 11/18/2009  . PVD 11/18/2009  . CHRONIC AIRWAY OBSTRUCTION NEC 11/18/2009  . PERSONAL HX TIA & CI W/O RESIDUAL DEFICITS 11/18/2009  . POSTSURGICAL AORTOCORONARY BYPASS STATUS 11/18/2009  . POSTSURG PERCUT TRANSLUMINAL COR ANGPLSTY STS 11/18/2009     Disposition: SNF  Discharge Condition: Stable  Discharge Exam:   BP 156/57 mmHg  Pulse 58  Temp(Src) 98 F (36.7 C) (Oral)  Resp 17  SpO2 100% Exam: General: elderly appearing gentleman laying in bed Eyes: left cataract. Right pupil round, reactive to light. Arcus senilis present. ENTM: dry and tacky oral mucosa with thick saliva. Geographic tongue. No oropharyngeal lesions Neck: supple Cardiovascular: RRR, normal heart sounds,  systolic murmur present. 2+ radial, PT pulses bilaterally.  Respiratory: clear to auscultation bilaterally, normal effort Abdomen: soft, thin, nontender, nondistended, normal bowel sounds MSK: no LE edema. There is some apparent muscle twitching of his right trapezius at rest. Skin: frail with scattered ecchymosis Neuro: alert and oriented to person/place/time/president. Follows commands appropriately. He has left sided facial droop. Strength in LUE 4/5 and LLE 4/5, right side is 5/5 Psych: normal thought content, speech is coherent.  Brief Hospital Course:  [name] is a 80 y.o. male who presented with weakness and hyperkalemia. PMH is significant for CVA, CAD s/p CABG 2000 and stenting, HTN, T2DM, COPD, HLD, tobacco use, blind in left eye (glaucoma).  On admission his potassium was 6.2. His hyperkalemia was thought to be primarily due to dehydration and after fluid repletion his potassium improved to 4.4.  He was additionally hypertensive to 183/60 and after resumption of his hone antihypertensives, his blood pressures returned to normal.  The remainder of his chronic medical conditions remained normal.  Issues for Follow Up:  1. Repeat BMET  Significant Procedures:  None  Significant Labs and Imaging:   Recent Labs Lab 03/26/15 0740 03/27/15 1759  WBC 9.4 12.0*  HGB 9.2* 10.0*  HCT 28.2* 31.0*  PLT 367 354    Recent Labs Lab 03/26/15 0740 03/27/15 1759 03/28/15 0443  NA 137 136 138  K 4.5 6.2* 4.4  CL 106 106 106  CO2 '26 22 24  '$ GLUCOSE 79 125* 104*  BUN 23* 21* 22*  CREATININE 0.88 1.37* 1.10  CALCIUM 9.0 9.6 9.0      Results/Tests Pending at Time of Discharge: None  Discharge Medications:    Medication List    TAKE these medications  ALPRAZolam 0.25 MG tablet  Commonly known as:  XANAX  Take one tablet by mouth three times daily for anxiety     amLODipine 5 MG tablet  Commonly known as:  NORVASC  Take 5 mg by mouth daily.     aspirin  325 MG tablet  Take 325 mg by mouth daily.     bisacodyl 5 MG EC tablet  Commonly known as:  DULCOLAX  Take 10 mg by mouth daily as needed for moderate constipation.     bisacodyl 10 MG suppository  Commonly known as:  DULCOLAX  Place 1 suppository (10 mg total) rectally daily as needed (no BM in 48 hours).     clopidogrel 75 MG tablet  Commonly known as:  PLAVIX  Take 75 mg by mouth daily.     docusate sodium 100 MG capsule  Commonly known as:  COLACE  Take 100 mg by mouth 2 (two) times daily.     ferrous sulfate 325 (65 FE) MG tablet  Take 325 mg by mouth daily with breakfast.     Fish Oil 1000 MG Caps  Take 1,000 mg by mouth 3 (three) times daily.     furosemide 20 MG tablet  Commonly known as:  LASIX  Take 20 mg by mouth daily.     isosorbide-hydrALAZINE 20-37.5 MG tablet  Commonly known as:  BIDIL  Take 1 tablet by mouth 3 (three) times daily.     labetalol 200 MG tablet  Commonly known as:  NORMODYNE  Take 1 tablet (200 mg total) by mouth 3 (three) times daily.     lisinopril 20 MG tablet  Commonly known as:  PRINIVIL,ZESTRIL  Take 20 mg by mouth daily.     metFORMIN 500 MG tablet  Commonly known as:  GLUCOPHAGE  Take 500 mg by mouth 2 (two) times daily with a meal.     nicotine 14 mg/24hr patch  Commonly known as:  NICODERM CQ - dosed in mg/24 hours  Place 1 patch (14 mg total) onto the skin daily.     polyethylene glycol powder powder  Commonly known as:  MIRALAX  Take 17 g by mouth 2 (two) times daily.     Potassium Chloride ER 20 MEQ Tbcr  Take 1 tablet by mouth daily.     pravastatin 80 MG tablet  Commonly known as:  PRAVACHOL  Take 1 tablet (80 mg total) by mouth daily at 6 PM.     RESOURCE THICKENUP CLEAR Powd  Per instructions to create nectar thick liquids     senna 8.6 MG Tabs tablet  Commonly known as:  SENOKOT  Take 2 tablets (17.2 mg total) by mouth daily.     tamsulosin 0.4 MG Caps capsule  Commonly known as:  FLOMAX  Take 0.4  mg by mouth daily after supper.        Discharge Instructions: Please refer to Patient Instructions section of EMR for full details.  Patient was counseled important signs and symptoms that should prompt return to medical care, changes in medications, dietary instructions, activity restrictions, and follow up appointments.   Follow-Up Appointments: Follow-up Information    Schedule an appointment as soon as possible for a visit with Neale Burly, MD.   Specialty:  Internal Medicine   Contact information:   [place] [phone]       Schedule an appointment as soon as possible for a visit to follow up.  Veatrice Bourbon, MD 03/28/2015, 3:52 PM PGY-2, [place]

## 2015-03-28 NOTE — Evaluation (Signed)
Occupational Therapy Evaluation Patient Details Name: Darin FRASIER MRN: [id number] DOB: 01-25-1935 Today's Date: 03/28/2015    History of Present Illness [name] is a 80 y.o. male with PMH of hypertension, hyperlipidemia, diabetes mellitus, COPD, anxiety, tobacco abuse, CAD, S/P stent placement, and in-stent restenosis, PVD, left eye blindness due to glaucoma, who presented from [facility] with weakness and hyperkalemia.   Clinical Impression   Pt admitted with above.  He demonstrates the below listed deficits.  He will benefit from continued OT at SNF to maximize safety and independence with ADLs.  All further OT needs can be addressed at SNF.  Acute OT will sign off at this time.       Follow Up Recommendations  SNF    Equipment Recommendations  None recommended by OT    Recommendations for Other Services       Precautions / Restrictions Precautions Precautions: Fall Precaution Comments: Pt is legally blind with minimal residual vision       Mobility Bed Mobility Overal bed mobility: Needs Assistance Bed Mobility: Sit to Supine     Supine to sit: HOB elevated;Min assist Sit to supine: Min assist   General bed mobility comments: assist for problem solving   Transfers Overall transfer level: Needs assistance Equipment used: Rolling walker (2 wheeled) Transfers: Sit to/from [facility] Sit to Stand: Min assist Stand pivot transfers: Mod assist       General transfer comment: mod A to negotiate around obstacles     Balance Overall balance assessment: Needs assistance Sitting-balance support: Feet supported Sitting balance-Leahy Scale: Good     Standing balance support: Bilateral upper extremity supported Standing balance-Leahy Scale: Poor                              ADL Overall ADL's : Needs assistance/impaired Eating/Feeding: Minimal assistance;Sitting   Grooming: Wash/dry hands;Wash/dry face;Oral care;Brushing  hair;Moderate assistance;Standing   Upper Body Bathing: Maximal assistance;Sitting   Lower Body Bathing: Maximal assistance;Sit to/from stand   Upper Body Dressing : Maximal assistance;Sitting   Lower Body Dressing: Moderate assistance;Sit to/from stand Lower Body Dressing Details (indicate cue type and reason): Pt is able to don/doff socks  Toilet Transfer: Moderate assistance;Ambulation;Regular Toilet;Grab bars;RW Armed forces technical officer Details (indicate cue type and reason): Pt demonstrates significant difficulty negotiating through environment.  Requires mod A to negotiate obstacles  Toileting- Clothing Manipulation and Hygiene: Minimal assistance;Sit to/from stand       Functional mobility during ADLs: Moderate assistance;Minimal assistance;Rolling walker       Vision Additional Comments: Pt unable to identify bed in room.   He also demonstrates Lt gaze preference    Perception Perception Perception Tested?: Yes Perception Deficits: Inattention/neglect Inattention/Neglect: Does not attend to right visual field   Praxis Praxis Praxis tested?: Deficits Deficits: Initiation    Pertinent Vitals/Pain Pain Assessment: No/denies pain     Hand Dominance Right   Extremity/Trunk Assessment Upper Extremity Assessment Upper Extremity Assessment: Generalized weakness   Lower Extremity Assessment Lower Extremity Assessment: Defer to PT evaluation   Cervical / Trunk Assessment Cervical / Trunk Assessment: Normal   Communication Communication Communication: No difficulties   Cognition Arousal/Alertness: Awake/alert Behavior During Therapy: Flat affect Overall Cognitive Status: No family/caregiver present to determine baseline cognitive functioning Area of Impairment: Attention;Following commands;Safety/judgement;Problem solving   Current Attention Level: Sustained Memory: Decreased short-term memory Following Commands: Follows one step commands consistently Safety/Judgement:  Decreased awareness of safety;Decreased awareness of  deficits   Problem Solving: Slow processing;Decreased initiation;Difficulty sequencing;Requires verbal cues;Requires tactile cues General Comments: Pt requires mod - max cues to navigate in room.  Pt unable to problem solve through deficits.  When asked if he is able to see, he states he is blind in his Lt eye, but is unable to tell therapist that his vision is very poor in the Rt eye.    General Comments       Exercises       Shoulder Instructions      Home Living Family/patient expects to be discharged to:: Skilled nursing facility                                        Prior Functioning/Environment Level of Independence: Needs assistance  Gait / Transfers Assistance Needed: using RW and walking limited distance with P.T. per pt ADL's / Homemaking Assistance Needed: mod assist for bathing, dressing and staff doing the homemaking        OT Diagnosis: Generalized weakness;Cognitive deficits;Disturbance of vision   OT Problem List: Decreased strength;Decreased activity tolerance;Impaired balance (sitting and/or standing);Impaired vision/perception;Decreased cognition;Decreased safety awareness;Decreased knowledge of use of DME or AE   OT Treatment/Interventions: Self-care/ADL training;DME and/or AE instruction;Therapeutic activities;Cognitive remediation/compensation;Visual/perceptual remediation/compensation;Patient/family education;Balance training    OT Goals(Current goals can be found in the care plan section) Acute Rehab OT Goals Patient Stated Goal: to go back to rehab  OT Goal Formulation: All assessment and education complete, DC therapy  OT Frequency:     Barriers to D/C:            Co-evaluation              End of Session Equipment Utilized During Treatment: Rolling walker Nurse Communication: Mobility status  Activity Tolerance: Patient tolerated treatment well Patient left: in  bed;with call bell/phone within reach;with bed alarm set   Time: 1643-1700 OT Time Calculation (min): 17 min Charges:  OT General Charges $OT Visit: 1 Procedure OT Evaluation $Initial OT Evaluation Tier I: 1 Procedure G-Codes: OT G-codes **NOT FOR INPATIENT CLASS** Functional Limitation: Self care Self Care Current Status (H9622): At least 40 percent but less than 60 percent impaired, limited or restricted Self Care Goal Status (W9798): At least 40 percent but less than 60 percent impaired, limited or restricted Self Care Discharge Status [phone]): At least 40 percent but less than 60 percent impaired, limited or restricted  [name] 03/28/2015, 5:22 PM

## 2015-03-28 NOTE — Evaluation (Signed)
Clinical/Bedside Swallow Evaluation Patient Details  Name: [name] MRN: [id number] Date of Birth: Dec 30, 1934  Today's Date: 03/28/2015 Time: SLP Start Time (ACUTE ONLY): 0906 SLP Stop Time (ACUTE ONLY): 0930 SLP Time Calculation (min) (ACUTE ONLY): 24 min  Past Medical History:  Past Medical History  Diagnosis Date  . CAD (coronary artery disease)     multi vessel. s/p recent bare metal stenting of high grade proximal R CA stenosis. residual 70% : main in-stent restenosis, with distal filling of the left anterior descending from graft flow. patent bypass graft, except for 100% occlusion of saphernous vein graft - 1st diagonal branch. 3-vessel CABG 2000.  Marland Kitchen CAD (coronary artery disease)     s/p of 95% L main artery stenosis, 4/03: cutting balloon PTCA of 80% in-stent restenosis, 7/04. preserved L ventriculr function  . Carotid bruit     bilateral. no sigificant distal abd atherosclerosis by recent cath.   Marland Kitchen COPD (chronic obstructive pulmonary disease) (HCC)     ongoing tobacco   . DM2 (diabetes mellitus, type 2) ([place])   . Dyslipidemia   . HTN (hypertension)   . Glaucoma   . CVA (cerebral infarction)   . Dysphagia   . Constipation    Past Surgical History:  Past Surgical History  Procedure Laterality Date  . 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 CT no acute abnormality, chronic infarct is again seen in the left basal ganglia, small, chronic infarct in the right pons is unchanged, as are cortical and subcortical infarcts in the right frontal, right parietal, and right occipital lobes. CXR 11/2 no active disease.   Assessment / Plan / Recommendation Clinical Impression  Pt on thick liquids at SNF. Outpatient MBS ordered, however cancelled several times  (unknown reason). Pt stated MBS ordered to determine ability to return to thin liquids. Signs of airway compromise during breakfast with nectar via straw and suspected delayed swallow. Recommend MBS for full oral and pharyngeal phase evaluation- scheduled for 10:00.     Aspiration Risk  Moderate aspiration risk    Diet Recommendation   Dys 2, nectar  Liquid Administration via: Cup Supervision: Staff to assist with self feeding Postural Changes: Seated upright at 90 degrees    Other  Recommendations Oral Care Recommendations: Oral care BID   Follow up Recommendations       Frequency and Duration            Prognosis        Swallow Study   General HPI: 80 year old male with history of prior PMH is significant for CVA, dysphagia, CAD s/p CABG 2000, HTN, T2DM, COPD, HLD, tobacco use, blind in left eye (glaucoma) admitted for evaluation of weakness, more prominent facial droop. CT no acute abnormality, chronic infarct is again seen in the left basal ganglia, small, chronic infarct in the right pons is unchanged, as are cortical and subcortical infarcts in the right frontal, right parietal, and right occipital lobes. CXR 11/2 no active disease. Type of Study: Bedside Swallow Evaluation Previous Swallow Assessment:  (see HPI) Diet Prior to this Study: Dysphagia 2 (chopped);Nectar-thick liquids Temperature Spikes Noted: No Respiratory Status: Room air History of Recent Intubation: No Behavior/Cognition: Alert;Cooperative;Pleasant mood Oral Cavity Assessment: Within Functional Limits Oral Care Completed by SLP: No Oral Cavity - Dentition:  Edentulous Vision: Impaired for self-feeding Self-Feeding Abilities: Needs assist;Needs set up;Able to feed self Patient Positioning: Upright in bed Baseline Vocal Quality: Normal Volitional Cough: Strong Volitional Swallow: Able to elicit    Oral/Motor/Sensory Function Overall Oral Motor/Sensory Function:  (baseline deficits) Facial ROM: Reduced  left Facial Symmetry: Abnormal symmetry left Lingual Symmetry: Abnormal symmetry left   Ice Chips Ice chips: Not tested   Thin Liquid Thin Liquid: Not tested    Nectar Thick Nectar Thick Liquid: Impaired Presentation: Straw Pharyngeal Phase Impairments: Cough - Delayed;Suspected delayed Swallow   Honey Thick Honey Thick Liquid: Not tested   Puree Puree: Impaired Presentation: Spoon Oral Phase Impairments:  (decreased sensation) Oral Phase Functional Implications: Oral residue (left labial residue)   Solid Solid: Not tested       Houston Siren 03/28/2015,9:44 AM  Orbie Pyo Colvin Caroli.Ed [employer] [phone]

## 2015-03-28 NOTE — Clinical Social Work Note (Signed)
Clinical Social Work Assessment  Patient Details  Name: [name] MRN: [id number] Date of Birth: Jan 03, 1935  Date of referral:  03/28/15               Reason for consult:  Facility Placement                Permission sought to share information with:  Facility Sport and exercise psychologist Permission granted to share information::  Yes, Verbal Permission Granted  Name::     Enid Derry, daughter  Agency::  [facility]  Relationship::  Daughter  Contact Information:  [phone]  Housing/Transportation Living arrangements for the past 2 months:  [place], [place] of Information:  Patient Patient Interpreter Needed:  None Criminal Activity/Legal Involvement Pertinent to Current Situation/Hospitalization:  No - Comment as needed Significant Relationships:  Adult Children Lives with:  Self Do you feel safe going back to the place where you live?  No Need for family participation in patient care:  Yes (Comment)  Care giving concerns:  CSW received referral for possible SNF placement at time of discharge. CSW met with patient regarding PT recommendation of SNF placement at time of discharge. Patient came to [facility] from [facility]. Patient expressed understanding of PT recommendation and is agreeable to SNF placement at time of discharge. CSW to continue to follow and assist with discharge planning needs.   Social Worker assessment / plan:  Spoke with patient concerning possibility of rehab at SNF before returning home. Patient came from [facility] and is willing to return there.   Employment status:  Retired Nurse, adult PT Recommendations:  [place] / Referral to community resources:  [place]  Patient/Family's Response to care:  Patient recognizes need for rehab and is agreeable to returning to [employer].  Patient/Family's Understanding of and Emotional Response to  Diagnosis, Current Treatment, and Prognosis:  Patient is realistic regarding therapy needs. No questions/concerns about plan or treatment.    Emotional Assessment Appearance:  Appears stated age Attitude/Demeanor/Rapport:    Affect (typically observed):  Accepting Orientation:  Oriented to Self, Oriented to Place, Oriented to  Time, Oriented to Situation Alcohol / Substance use:  Not Applicable Psych involvement (Current and /or in the community):  No (Comment)  Discharge Needs  Concerns to be addressed:  Care Coordination Readmission within the last 30 days:  Yes Current discharge risk:  None Barriers to Discharge:  No Barriers Identified   Benard Halsted, LCSW 03/28/2015, 3:34 PM

## 2015-03-28 NOTE — Progress Notes (Signed)
Patient will DC to: [facility] Anticipated DC date: 03/28/15  Family notified: Left voicemail for daughter, Enid Derry. Transport by: [name]  CSW signing off.  Cedric Fishman, [place] Social Worker [phone]

## 2015-03-28 NOTE — Progress Notes (Signed)
Family Medicine Teaching Service Daily Progress Note Intern Pager: [phone]  Patient name: [name] Medical record number: [id number] Date of birth: 1934/08/27 Age: 80 y.o. Gender: male  Primary Care Provider: Neale Burly, MD Consultants: None Code Status: Full  Pt Overview and Major Events to Date:  11/24: Admitted to [facility] with weakness and hyperkalemia  Assessment and Plan: [name] is a 80 y.o. male presenting with weakness and hyperkalemia. PMH is significant for CVA, CAD s/p CABG 2000 and stenting, HTN, T2DM, COPD, HLD, tobacco use, blind in left eye (glaucoma).  Hyperkalemia and dehydration: K+ 6.2 on admission > 4.4 this am. Pt takes Lasix '20mg'$  daily with 74mq potassium at home. EKG largely unchanged from earlier this month (T waves do appear a little more prominent but not peaked). ECHO from 03/06/2015 showed EF 80%/G1DD. This is likely contributing to his weakness. MRI brain performed in the ED for concern of possible repeat/worsening stroke was unchanged from previous. - Continue MIVFs: NS @ 1537mhr, as Pt is not eating or drinking much - Holding home Lasix and Potassium in the setting of hyperkalemia and dehyration - Monitor with daily BMETs - Pt states he lives with daughter. Attempted to get in touch with daughter, but she did not answer. Will need to decide if Pt should go home vs to SNF.   AKI: Creatine on admission was 1.37. Baseline 0.9-1.0. Likely secondary to dehydration. Cr this am improved to 1.10. - Will continue hydration with NS @ 15045mr. - Monitor with daily BMETs.  HTN: BP elevated on admission to 183/60. BPs have improved overnight to 132/50 this morning. - Continue home Amlodipine 5, Lisinopril 20, Bidil 20-37.5 TID, Labetalol 200 TID  T2DM: Most recent A1c 6.2 on 03/06/15. CBG on admission was 76. - Will hold off on SSI given good control  COPD: Stable respiratory status, medication rec does not show any albuterol/controller medications -  Monitor clinically - Can consider adding PRN Albuterol if he begins having difficulty breathing.  HLD: Stable, lipid panel 03/06/15 very well controlled TC 131, LDL 81, HDL 43. - Continue Pravastatin '80mg'$  daily  Dysphagia:  - SLP recommending Dysphagia 2 diet   Benzodiazepine use: Came in on Xanax TID, no clear indication in problem list/history.  - Will continue home Xanax - Recommend titrating off, as he is an elderly frail gentleman and we do not know why he is taking the Xanax  FEN/GI: MIVFs: NS @ 150m3m, SLP recommending Dysphagia 2 diet. Prophylaxis: Lovenox  Disposition: Discharge home today or tomorrow. Need to get in touch with daughter to figure out placement.  Subjective:  Pt states he feels much better this morning. He is not having any more weakness. He states he lives at home with his daughter and her family. He says he has never lived in a nursing home before. He has no concerns this morning.  Objective: Temp:  [98 F (36.7 C)-98.6 F (37 C)] 98.6 F (37 C) (11/25 0506) Pulse Rate:  [55-64] 62 (11/25 0506) Resp:  [11-20] 20 (11/25 0506) BP: (110-183)/(42-67) 132/50 mmHg (11/25 0506) SpO2:  [97 %-100 %] 100 % (11/25 0506) Weight:  [155 lb 6.8 oz (70.5 kg)] 155 lb 6.8 oz (70.5 kg) (11/24 2314) Physical Exam: General: Elderly appearing gentleman laying in bed HEENT: [place]/AT, left cataract present, dry mucous membranes.  Neck: supple Cardiovascular: RRR, normal heart sounds, systolic murmur present. 2+ radial, PT pulses bilaterally.  Respiratory: clear to auscultation bilaterally, normal effort Abdomen: soft, thin, nontender, nondistended,  normal bowel sounds MSK: no LE edema. Moves all 4 extremities spontaneously. Skin: Frail with scattered ecchymosis Neuro: alert and oriented to person/place/time/president. Follows commands appropriately. Left-sided facial droop is present. Psych: normal thought content, speech is coherent.  Laboratory:  Recent Labs Lab  03/26/15 0740 03/27/15 1759  WBC 9.4 12.0*  HGB 9.2* 10.0*  HCT 28.2* 31.0*  PLT 367 354    Recent Labs Lab 03/26/15 0740 03/27/15 1759 03/28/15 0443  NA 137 136 138  K 4.5 6.2* 4.4  CL 106 106 106  CO2 '26 22 24  '$ BUN 23* 21* 22*  CREATININE 0.88 1.37* 1.10  CALCIUM 9.0 9.6 9.0  GLUCOSE 79 125* 104*   UA: 15 ketones, negative leukocytes, negative nitrites  Imaging/Diagnostic Tests: MRI brain: No acute intracranial abnormality, stable chronic infarcts  Sela Hua, MD 03/28/2015, 7:10 AM PGY-1, [place] Intern pager: [phone], text pages welcome

## 2015-03-28 NOTE — Progress Notes (Signed)
Call reported from [facility] at [facility] that patient previously had Guaiac stool test done there and results came back positive. Patient placed on Enteric precaution. MD notified.

## 2015-03-28 NOTE — Progress Notes (Signed)
Speech Pathology   MBSS complete. Full report located under chart review in imaging section.   CHL IP CLINICAL IMPRESSIONS 03/28/2015  Therapy Diagnosis Mild pharyngeal phase dysphagia;Moderate pharyngeal phase dysphagia;Mild oral phase dysphagia  Clinical Impression Mild-mod pharyngeal sensory based dysphagia due to decreased sensory tract that led to delayed trigger of swallow. Laryngeal penetration to vocal cords (silent) before swallow initiated due to head in semi (natural) tucked position and delayed swallow (2 separate occasions). Verbal cued coughs reduced amount of penetrates. Delayed oral transit and inefficient mastication with solid. No residual post swallow. Recommend upgrade solids to Dys 3 and continue nectar with goal of repeat MBS in several months to reassess. Pills whole in applesauce, no straws.      Impact on safety and function Moderate aspiration risk   Houston Siren M.Ed [employer] [phone]

## 2015-03-28 NOTE — Progress Notes (Signed)
Speech Language Pathology  Patient Details Name: [name] MRN: [id number] DOB: 12-Oct-1934 Today's Date: 03/28/2015 Time: 1030-1314 SLP Time Calculation (min) (ACUTE ONLY): 24 min    MBS completed. Full report to be completed. Recommend: Dys 3, nectar (SLP will order).   Orbie Pyo [employer].Ed [employer] [phone]

## 2015-03-28 NOTE — Evaluation (Addendum)
Physical Therapy Evaluation Patient Details Name: [name] MRN: [id number] DOB: 05/25/1934 Today's Date: 03/28/2015   History of Present Illness  [name] is a 80 y.o. male with PMH of hypertension, hyperlipidemia, diabetes mellitus, COPD, anxiety, tobacco abuse, CAD, S/P stent placement, and in-stent restenosis, PVD, left eye blindness due to glaucoma, who presented from [facility] with weakness and hyperkalemia.  Clinical Impression  Pt pleasant sitting up in bed on arrival. Pt able to recall he has been at [facility] working with therapy and walking short distance. Pt able to mobilize with 1 assist with encouragement to request additional mobility with staff daily. Pt with decreased strength and endurance who will benefit from acute therapy to maximize mobility, function and strength to decrease burden of care.     Follow Up Recommendations SNF;Supervision/Assistance - 24 hour    Equipment Recommendations  None recommended by PT    Recommendations for Other Services       Precautions / Restrictions Precautions Precautions: Fall Precaution Comments: left eye blind, limited vision right eye      Mobility  Bed Mobility Overal bed mobility: Needs Assistance Bed Mobility: Supine to Sit     Supine to sit: HOB elevated;Min assist     General bed mobility comments: cues for sequence with HOB 30degrees, cues for use of rail and directional cues, assist to fully elevate trunk and scoot to EOB  Transfers Overall transfer level: Needs assistance   Transfers: Sit to/from Stand;Stand Pivot Transfers Sit to Stand: Min assist Stand pivot transfers: Min assist       General transfer comment: cues for hand placement with hand over hand positioning x 2 from bed and recliner, assist to direct RW  Ambulation/Gait Ambulation/Gait assistance: Min assist Ambulation Distance (Feet): 25 Feet Assistive device: Rolling walker (2 wheeled) Gait Pattern/deviations: Step-through  pattern;Decreased stride length   Gait velocity interpretation: Below normal speed for age/gender General Gait Details: assist to manage and maneuver RW with cues throughout, cues to step into RW  Stairs            Wheelchair Mobility    Modified Rankin (Stroke Patients Only)       Balance Overall balance assessment: Needs assistance   Sitting balance-Leahy Scale: Good       Standing balance-Leahy Scale: Poor                               Pertinent Vitals/Pain Pain Assessment: No/denies pain    Home Living Family/patient expects to be discharged to:: Skilled nursing facility                      Prior Function Level of [place]: Needs assistance   Gait / Transfers Assistance Needed: using RW and walking limited distance with P.T. per pt  ADL's / Homemaking Assistance Needed: mod assist for bathing, dressing and staff doing the homemaking        Hand Dominance        Extremity/Trunk Assessment   Upper Extremity Assessment: Overall WFL for tasks assessed           Lower Extremity Assessment: Generalized weakness      Cervical / Trunk Assessment: Normal  Communication   Communication: No difficulties  Cognition Arousal/Alertness: Awake/alert Behavior During Therapy: WFL for tasks assessed/performed Overall Cognitive Status: Within Functional Limits for tasks assessed       Memory: Decreased short-term memory  General Comments      Exercises        Assessment/Plan    PT Assessment Patient needs continued PT services  PT Diagnosis Generalized weakness   PT Problem List Decreased activity tolerance;Decreased balance;Decreased mobility;Decreased strength;Decreased knowledge of use of DME;Decreased safety awareness  PT Treatment Interventions Functional mobility training;Therapeutic activities;Therapeutic exercise;Balance training;Patient/family education;Gait training;DME instruction   PT Goals  (Current goals can be found in the Care Plan section) Acute Rehab PT Goals Patient Stated Goal: return home eventually Time For Goal Achievement: 04/11/15 Potential to Achieve Goals: Fair    Frequency Min 2X/week   Barriers to discharge        Co-evaluation               End of Session Equipment Utilized During Treatment: Gait belt Activity Tolerance: Patient tolerated treatment well Patient left: in chair;with call bell/phone within reach;with chair alarm set;with nursing/sitter in room Nurse Communication: Mobility status;Precautions    Functional Assessment Tool Used: clinical judgement Functional Limitation: Mobility: Walking and moving around Mobility: Walking and Moving Around Current Status [phone]): At least 1 percent but less than 20 percent impaired, limited or restricted Mobility: Walking and Moving Around Goal Status [phone]): At least 1 percent but less than 20 percent impaired, limited or restricted    Time: 9449-6759 PT Time Calculation (min) (ACUTE ONLY): 18 min   Charges:   PT Evaluation $Initial PT Evaluation Tier I: 1 Procedure     PT G Codes:   PT G-Codes **NOT FOR INPATIENT CLASS** Functional Assessment Tool Used: clinical judgement Functional Limitation: Mobility: Walking and moving around Mobility: Walking and Moving Around Current Status (F6384): At least 1 percent but less than 20 percent impaired, limited or restricted Mobility: Walking and Moving Around Goal Status [phone]): At least 1 percent but less than 20 percent impaired, limited or restricted    Melford Aase 03/28/2015, 1:50 PM Elwyn Reach, [place]

## 2015-03-28 NOTE — [place] FL2 (Signed)
[place] MEDICAID FL2 LEVEL OF CARE SCREENING TOOL     IDENTIFICATION  Patient Name: [name] Birthdate: 07/08/1934 Sex: male Admission Date (Current Location): 03/27/2015  [facility] and [place] Number: Herbalist and Address:  [facility], [place] [address] [place],  [address]      Provider Number: [id number]  Attending Physician Name and Address:  Dickie La, MD  Relative Name and Phone Number:  Darlen Round (daughter) [phone]    Current Level of Care: Hospital Recommended Level of Care: [place] Prior Approval Number:    Date Approved/Denied:   PASRR Number: [id number]  Discharge Plan: SNF    Current Diagnoses: Patient Active Problem List   Diagnosis Date Noted  . Hyperkalemia 03/27/2015  . Anemia due to chronic blood loss 03/26/2015  . Dysphagia 03/09/2015  . Colitis, ischemic ([place]) 03/07/2015  . Facial droop 03/07/2015  . Malignant hypertension 03/06/2015  . Constipation 03/06/2015  . Abdominal pain 03/06/2015  . Sepsis ([place]) 03/06/2015  . Anxiety 03/06/2015  . AKI (acute kidney injury) ([place]) 03/06/2015  . Elevated troponin 03/06/2015  . Diabetes mellitus without complication ([place]) 53/64/6803  . Abnormal EKG   . COPD (chronic obstructive pulmonary disease) ([place]) 11/18/2009  . HLD (hyperlipidemia) 11/18/2009  . NONDEPENDENT TOBACCO USE DISORDER 11/18/2009  . Essential hypertension 11/18/2009  . INTERMEDIATE CORONARY SYNDROME 11/18/2009  . Coronary atherosclerosis 11/18/2009  . PVD 11/18/2009  . CHRONIC AIRWAY OBSTRUCTION NEC 11/18/2009  . PERSONAL HX TIA & CI W/O RESIDUAL DEFICITS 11/18/2009  . POSTSURGICAL AORTOCORONARY BYPASS STATUS 11/18/2009  . POSTSURG PERCUT TRANSLUMINAL COR ANGPLSTY STS 11/18/2009    Orientation ACTIVITIES/SOCIAL BLADDER RESPIRATION    Self, Time, Situation, Place    Incontinent Normal  BEHAVIORAL SYMPTOMS/MOOD NEUROLOGICAL BOWEL NUTRITION STATUS       Incontinent  (See DC Summary)  PHYSICIAN VISITS COMMUNICATION OF NEEDS Height & Weight Skin    Verbally '5\' 9"'$  (175.3 cm) 155 lbs. Normal          AMBULATORY STATUS RESPIRATION    Assist extensive Normal      Personal Care Assistance Level of Assistance  Bathing, Feeding, Dressing Bathing Assistance: Maximum assistance Feeding assistance: Limited assistance Dressing Assistance: Maximum assistance      Functional Limitations Info  Sight Sight Info: Impaired           SPECIAL CARE FACTORS FREQUENCY  PT (By licensed PT)     PT Frequency: 5x/week             Additional Factors Info  Code Status, Allergies Code Status Info: Full Allergies Info: NKA           Current Medications (03/28/2015): Current Facility-Administered Medications  Medication Dose Route Frequency Provider Last Rate Last Dose  . acetaminophen (TYLENOL) tablet 650 mg  650 mg Oral Q6H PRN Leone Brand, MD       Or  . acetaminophen (TYLENOL) suppository 650 mg  650 mg Rectal Q6H PRN Leone Brand, MD      . ALPRAZolam Duanne Moron) tablet 0.25 mg  0.25 mg Oral TID Leone Brand, MD   0.25 mg at 03/28/15 1049  . amLODipine (NORVASC) tablet 5 mg  5 mg Oral Daily Leone Brand, MD   5 mg at 03/28/15 1049  . antiseptic oral rinse (CPC / CETYLPYRIDINIUM CHLORIDE 0.05%) solution 7 mL  7 mL Mouth Rinse q12n4p Dickie La, MD   7 mL at 03/28/15 1246  . aspirin tablet  325 mg  325 mg Oral Daily Leone Brand, MD   325 mg at 03/28/15 1049  . chlorhexidine (PERIDEX) 0.12 % solution 15 mL  15 mL Mouth Rinse BID Dickie La, MD   15 mL at 03/28/15 1246  . clopidogrel (PLAVIX) tablet 75 mg  75 mg Oral Daily Leone Brand, MD   75 mg at 03/28/15 1049  . docusate sodium (COLACE) capsule 100 mg  100 mg Oral BID Leone Brand, MD   100 mg at 03/27/15 2330  . enoxaparin (LOVENOX) injection 40 mg  40 mg Subcutaneous Q24H Leone Brand, MD   40 mg at 03/28/15 1049  . ferrous sulfate tablet 325 mg  325 mg Oral Q  breakfast Leone Brand, MD   325 mg at 03/28/15 0856  . isosorbide-hydrALAZINE (BIDIL) 20-37.5 MG per tablet 1 tablet  1 tablet Oral TID Leone Brand, MD   1 tablet at 03/28/15 1049  . labetalol (NORMODYNE) tablet 200 mg  200 mg Oral TID Leone Brand, MD   200 mg at 03/28/15 1049  . lisinopril (PRINIVIL,ZESTRIL) tablet 20 mg  20 mg Oral Daily Leone Brand, MD   20 mg at 03/28/15 1049  . metFORMIN (GLUCOPHAGE) tablet 500 mg  500 mg Oral BID WC Leone Brand, MD   500 mg at 03/28/15 0856  . ondansetron (ZOFRAN) tablet 4 mg  4 mg Oral Q6H PRN Leone Brand, MD       Or  . ondansetron [facility]) injection 4 mg  4 mg Intravenous Q6H PRN Leone Brand, MD      . pravastatin (PRAVACHOL) tablet 80 mg  80 mg Oral q1800 Leone Brand, MD      . RESOURCE [facility] CLEAR   Oral PRN Dickie La, MD      . senna [facility]) tablet 17.2 mg  2 tablet Oral Daily Leone Brand, MD   17.2 mg at 03/28/15 1049  . sodium chloride 0.9 % injection 3 mL  3 mL Intravenous Q12H Leone Brand, MD   3 mL at 03/28/15 1000  . tamsulosin (FLOMAX) capsule 0.4 mg  0.4 mg Oral QPC supper Leone Brand, MD       Do not use this list as official medication orders. Please verify with discharge summary.  Discharge Medications:   Medication List    TAKE these medications        ALPRAZolam 0.25 MG tablet  Commonly known as:  XANAX  Take one tablet by mouth three times daily for anxiety     amLODipine 5 MG tablet  Commonly known as:  NORVASC  Take 5 mg by mouth daily.     aspirin 325 MG tablet  Take 325 mg by mouth daily.     bisacodyl 5 MG EC tablet  Commonly known as:  DULCOLAX  Take 10 mg by mouth daily as needed for moderate constipation.     bisacodyl 10 MG suppository  Commonly known as:  DULCOLAX  Place 1 suppository (10 mg total) rectally daily as needed (no BM in 48 hours).     clopidogrel 75 MG tablet  Commonly known as:  PLAVIX  Take 75 mg by mouth daily.     docusate sodium 100 MG capsule   Commonly known as:  COLACE  Take 100 mg by mouth 2 (two) times daily.     ferrous sulfate 325 (65 FE) MG tablet  Take 325 mg by mouth  daily with breakfast.     Fish Oil 1000 MG Caps  Take 1,000 mg by mouth 3 (three) times daily.     furosemide 20 MG tablet  Commonly known as:  LASIX  Take 20 mg by mouth daily.     isosorbide-hydrALAZINE 20-37.5 MG tablet  Commonly known as:  BIDIL  Take 1 tablet by mouth 3 (three) times daily.     labetalol 200 MG tablet  Commonly known as:  NORMODYNE  Take 1 tablet (200 mg total) by mouth 3 (three) times daily.     lisinopril 20 MG tablet  Commonly known as:  PRINIVIL,ZESTRIL  Take 20 mg by mouth daily.     metFORMIN 500 MG tablet  Commonly known as:  GLUCOPHAGE  Take 500 mg by mouth 2 (two) times daily with a meal.     nicotine 14 mg/24hr patch  Commonly known as:  NICODERM CQ - dosed in mg/24 hours  Place 1 patch (14 mg total) onto the skin daily.     polyethylene glycol powder powder  Commonly known as:  MIRALAX  Take 17 g by mouth 2 (two) times daily.     Potassium Chloride ER 20 MEQ Tbcr  Take 1 tablet by mouth daily.     pravastatin 80 MG tablet  Commonly known as:  PRAVACHOL  Take 1 tablet (80 mg total) by mouth daily at 6 PM.     RESOURCE THICKENUP CLEAR Powd  Per instructions to create nectar thick liquids     senna 8.6 MG Tabs tablet  Commonly known as:  SENOKOT  Take 2 tablets (17.2 mg total) by mouth daily.     tamsulosin 0.4 MG Caps capsule  Commonly known as:  FLOMAX  Take 0.4 mg by mouth daily after supper.        Relevant Imaging Results:  Relevant Lab Results:  Recent Labs    Additional Information Enteric precautions (UV disinfection)  Lissa Morales Latrelle Fuston, LCSW

## 2015-03-29 DIAGNOSIS — K625 Hemorrhage of anus and rectum: Secondary | ICD-10-CM | POA: Insufficient documentation

## 2015-03-29 DIAGNOSIS — D649 Anemia, unspecified: Secondary | ICD-10-CM | POA: Insufficient documentation

## 2015-03-29 DIAGNOSIS — K922 Gastrointestinal hemorrhage, unspecified: Secondary | ICD-10-CM | POA: Insufficient documentation

## 2015-03-30 ENCOUNTER — Ambulatory Visit (HOSPITAL_COMMUNITY)
Admission: RE | Admit: 2015-03-30 | Discharge: 2015-03-30 | Disposition: A | Discharge status: Home / Self Care | Payer: Medicare Other | Source: Ambulatory Visit | Attending: Internal Medicine | Admitting: Internal Medicine

## 2015-03-30 ENCOUNTER — Encounter (HOSPITAL_COMMUNITY)
Admission: RE | Admit: 2015-03-30 | Discharge: 2015-03-30 | Disposition: A | Discharge status: Home / Self Care | Payer: Medicare Other | Source: Skilled Nursing Facility | Attending: Internal Medicine | Admitting: Internal Medicine

## 2015-03-30 ENCOUNTER — Other Ambulatory Visit: Payer: Self-pay | Admitting: Internal Medicine

## 2015-03-30 DIAGNOSIS — D72829 Elevated white blood cell count, unspecified: Secondary | ICD-10-CM | POA: Diagnosis not present

## 2015-03-30 DIAGNOSIS — N390 Urinary tract infection, site not specified: Secondary | ICD-10-CM | POA: Diagnosis not present

## 2015-03-30 DIAGNOSIS — R0989 Other specified symptoms and signs involving the circulatory and respiratory systems: Secondary | ICD-10-CM | POA: Diagnosis not present

## 2015-03-30 DIAGNOSIS — R82998 Other abnormal findings in urine: Secondary | ICD-10-CM

## 2015-03-30 LAB — CBC WITH DIFFERENTIAL/PLATELET
BASOS ABS: 0 10*3/uL (ref 0.0–0.1)
BASOS PCT: 0 %
EOS ABS: 0.2 10*3/uL (ref 0.0–0.7)
Eosinophils Relative: 1 %
HEMATOCRIT: 26.8 % — AB (ref 39.0–52.0)
HEMOGLOBIN: 8.9 g/dL — AB (ref 13.0–17.0)
Lymphocytes Relative: 18 %
Lymphs Abs: 2.1 10*3/uL (ref 0.7–4.0)
MCH: 28.9 pg (ref 26.0–34.0)
MCHC: 33.2 g/dL (ref 30.0–36.0)
MCV: 87 fL (ref 78.0–100.0)
MONOS PCT: 6 %
Monocytes Absolute: 0.7 10*3/uL (ref 0.1–1.0)
NEUTROS ABS: 8.9 10*3/uL — AB (ref 1.7–7.7)
NEUTROS PCT: 75 %
Platelets: 376 10*3/uL (ref 150–400)
RBC: 3.08 MIL/uL — ABNORMAL LOW (ref 4.22–5.81)
RDW: 13.5 % (ref 11.5–15.5)
WBC: 11.8 10*3/uL — AB (ref 4.0–10.5)

## 2015-03-30 LAB — URINALYSIS, ROUTINE W REFLEX MICROSCOPIC
BILIRUBIN URINE: NEGATIVE
Glucose, UA: NEGATIVE mg/dL
Hgb urine dipstick: NEGATIVE
KETONES UR: NEGATIVE mg/dL
LEUKOCYTES UA: NEGATIVE
NITRITE: NEGATIVE
Protein, ur: NEGATIVE mg/dL
SPECIFIC GRAVITY, URINE: 1.015 (ref 1.005–1.030)
pH: 7 (ref 5.0–8.0)

## 2015-03-30 LAB — BASIC METABOLIC PANEL
ANION GAP: 4 — AB (ref 5–15)
BUN: 14 mg/dL (ref 6–20)
CALCIUM: 8.6 mg/dL — AB (ref 8.9–10.3)
CO2: 24 mmol/L (ref 22–32)
CREATININE: 0.7 mg/dL (ref 0.61–1.24)
Chloride: 109 mmol/L (ref 101–111)
Glucose, Bld: 109 mg/dL — ABNORMAL HIGH (ref 65–99)
Potassium: 3.8 mmol/L (ref 3.5–5.1)
SODIUM: 137 mmol/L (ref 135–145)

## 2015-03-31 ENCOUNTER — Encounter (HOSPITAL_COMMUNITY)
Admission: RE | Admit: 2015-03-31 | Discharge: 2015-03-31 | Disposition: A | Discharge status: Home / Self Care | Payer: Medicare Other | Source: Skilled Nursing Facility | Attending: Internal Medicine | Admitting: Internal Medicine

## 2015-03-31 ENCOUNTER — Other Ambulatory Visit: Payer: Self-pay | Admitting: *Deleted

## 2015-03-31 LAB — BASIC METABOLIC PANEL
Anion gap: 7 (ref 5–15)
BUN: 16 mg/dL (ref 6–20)
CALCIUM: 8.7 mg/dL — AB (ref 8.9–10.3)
CO2: 26 mmol/L (ref 22–32)
CREATININE: 0.81 mg/dL (ref 0.61–1.24)
Chloride: 108 mmol/L (ref 101–111)
GFR calc non Af Amer: >60 mL/min (ref >60)
Glucose, Bld: 84 mg/dL (ref 65–99)
Potassium: 3.8 mmol/L (ref 3.5–5.1)
SODIUM: 141 mmol/L (ref 135–145)

## 2015-03-31 LAB — CBC
HCT: 24.4 % — ABNORMAL LOW (ref 39.0–52.0)
Hemoglobin: 8.1 g/dL — ABNORMAL LOW (ref 13.0–17.0)
MCH: 29.2 pg (ref 26.0–34.0)
MCHC: 33.2 g/dL (ref 30.0–36.0)
MCV: 88.1 fL (ref 78.0–100.0)
PLATELETS: 362 10*3/uL (ref 150–400)
RBC: 2.77 MIL/uL — AB (ref 4.22–5.81)
RDW: 13.6 % (ref 11.5–15.5)
WBC: 11.8 10*3/uL — AB (ref 4.0–10.5)

## 2015-03-31 MED ORDER — ALPRAZOLAM 0.25 MG PO TABS: ORAL_TABLET | ORAL | Status: DC

## 2015-03-31 NOTE — Telephone Encounter (Signed)
[facility] 

## 2015-04-01 ENCOUNTER — Encounter (HOSPITAL_COMMUNITY)
Admission: AD | Admit: 2015-04-01 | Discharge: 2015-04-01 | Disposition: A | Discharge status: Home / Self Care | Payer: Medicare Other | Source: Skilled Nursing Facility | Attending: Internal Medicine | Admitting: Internal Medicine

## 2015-04-01 LAB — BASIC METABOLIC PANEL
ANION GAP: 8 (ref 5–15)
BUN: 14 mg/dL (ref 6–20)
CALCIUM: 8.9 mg/dL (ref 8.9–10.3)
CHLORIDE: 106 mmol/L (ref 101–111)
CO2: 26 mmol/L (ref 22–32)
Creatinine, Ser: 0.71 mg/dL (ref 0.61–1.24)
GFR calc non Af Amer: >60 mL/min (ref >60)
Glucose, Bld: 86 mg/dL (ref 65–99)
Potassium: 3.8 mmol/L (ref 3.5–5.1)
SODIUM: 140 mmol/L (ref 135–145)

## 2015-04-01 LAB — URINE CULTURE

## 2015-04-01 NOTE — Progress Notes (Addendum)
Patient ID: [name], male   DOB: Jan 17, 1935, 80 y.o.   MRN: [id number]                PROGRESS NOTE  DATE:  03/24/2015         FACILITY: [facility]              LEVEL OF CARE:   SNF   Acute Visit            CHIEF COMPLAINT:  Difficult to arouse.    HISTORY OF PRESENT ILLNESS:  This is a gentleman who came to [place] initially after presenting with abdominal pain.  Work-up suggested that he had chronic ischemic colitis in the setting of severe constipation.    He is a type 2 diabetic.  His hemoglobin A1c was 6.2.    He had poorly controlled hypertension on presentation.  Was felt to have worsening renal function secondary to this.  His presenting blood pressure was 202/77.  Ultimately, he was placed on amlodipine 10 mg a day, BiDil 37.5, 1 tablet three times a day, Lisinopril 20 q.d.     Today, he was apparently sitting in the day room.  The staff came to weigh him for routine weight.  They found him to be unresponsive.  Called the nurse.  Eventually with a sternal rub, he was able to be aroused.  However, his vital signs on presentation showed his blood pressure to be low at 94/44 with the machine and at 89/46 manually.  His CBG at the time was 127.  He was brought back to bed where he was put in a Trendelenburg and he came around.  He is not complaining of chest pain, palpitations, shortness of breath, focal weakness or numbness.       Past Medical History  Diagnosis Date  . CAD (coronary artery disease)     multi vessel. s/p recent bare metal stenting of high grade proximal R CA stenosis. residual 70% : main in-stent restenosis, with distal filling of the left anterior descending from graft flow. patent bypass graft, except for 100% occlusion of saphernous vein graft - 1st diagonal branch. 3-vessel CABG 2000.  Marland Kitchen CAD (coronary artery disease)     s/p of 95% L main artery stenosis, 4/03: cutting balloon PTCA of 80% in-stent restenosis, 7/04. preserved L ventriculr function    . Carotid bruit     bilateral. no sigificant distal abd atherosclerosis by recent cath.   Marland Kitchen COPD (chronic obstructive pulmonary disease) (H[facility])     ongoing tobacco   . DM2 (diabetes mellitus, type 2) ([place])   . Dyslipidemia   . HTN (hypertension)   . Glaucoma   . CVA (cerebral infarction)   . Dysphagia   . Constipation   . Past Surgical History  Procedure Laterality Date  . Cardiac catheterization      LABORATORY DATA/RADIOLOGY:   His last lab work was on 03/17/2015:      Sodium 138, potassium 4.2, BUN 13, creatinine 0.99.    White count 9, hemoglobin 11.4, differential count normal.    CT scan of the head on 03/15/2015, when he was sent out for a question of facial weakness, showed no acute abnormality with remote infarcts and extensive chronic microvascular ischemic change.  The remote infarcts were in the right occipital and basal ganglia on the right.    REVIEW OF SYSTEMS:    General: no fever, no chills, Patient states he feels "alright"  HEENT:   The  patient is not complaining of headache.     CHEST/RESPIRATORY:  No clear cough or sputum.    CARDIAC:  No chest pain or palpitations.   GI:  States he is constipated.  Not having abdominal pain.    GU:  No clear dysuria.    NEUROLOGICAL:  Not complaining of lateralizing weakness or numbness.      PHYSICAL EXAMINATION:   VITAL SIGNS:     BLOOD PRESSURE:  Supine, I did his blood pressure at 120/80.   GENERAL APPEARANCE:  The patient looks much the same.  He is awake, conversational.   CHEST/RESPIRATORY:  Clear air entry bilaterally.    CARDIOVASCULAR:   CARDIAC:  Heart sounds are normal.  There are no murmurs.  He does not appear to be dehydrated.    GASTROINTESTINAL:   ABDOMEN:  Bowel sounds are positive.  He is diffusely mildly tender.     LIVER/SPLEEN/KIDNEYS:  No liver, no spleen.   GENITOURINARY:   BLADDER:  No suprapubic or costovertebral angle tenderness.    NEUROLOGICAL:    CRANIAL NERVES:  He has left-sided  facial weakness.   This is not new.   SENSATION/STRENGTH:  Perhaps mild left arm weakness.  Right arm strength is normal.  He has antigravity strength in his legs.      ASSESSMENT/PLAN:          Altered LOC.  The staff feel that this is likely to be orthostatic.  Interestingly, when they sat him back in the chair, his blood pressure dropped.  I also wondered about possible postictal phenomenon.  When I went into the room, there was an up-and-down movement of the right shoulder that the patient seemed unaware of.  I will also order an EEG on him, as well as neuro checks.  He is a diabetic and could very well have orthostatic hypotension from diabetic autonomic state.  Lab work will be checked.  For now, he appears to be stable.  We will need to check is postural drop  Late effect CVA: Left sided facial and left arm weakness, not new.

## 2015-04-02 ENCOUNTER — Non-Acute Institutional Stay (SKILLED_NURSING_FACILITY): Payer: Medicare Other | Admitting: Internal Medicine

## 2015-04-02 ENCOUNTER — Encounter: Payer: Self-pay | Admitting: Internal Medicine

## 2015-04-02 ENCOUNTER — Encounter (HOSPITAL_COMMUNITY)
Admission: RE | Admit: 2015-04-02 | Discharge: 2015-04-02 | Disposition: A | Discharge status: Home / Self Care | Payer: Medicare Other | Source: Skilled Nursing Facility | Attending: Internal Medicine | Admitting: Internal Medicine

## 2015-04-02 DIAGNOSIS — I951 Orthostatic hypotension: Secondary | ICD-10-CM

## 2015-04-02 DIAGNOSIS — I10 Essential (primary) hypertension: Secondary | ICD-10-CM

## 2015-04-02 DIAGNOSIS — E875 Hyperkalemia: Secondary | ICD-10-CM

## 2015-04-02 DIAGNOSIS — D649 Anemia, unspecified: Secondary | ICD-10-CM | POA: Diagnosis not present

## 2015-04-02 LAB — BASIC METABOLIC PANEL
Anion gap: 10 (ref 5–15)
BUN: 15 mg/dL (ref 6–20)
CO2: 22 mmol/L (ref 22–32)
Calcium: 8.8 mg/dL — ABNORMAL LOW (ref 8.9–10.3)
Chloride: 107 mmol/L (ref 101–111)
Creatinine, Ser: 0.77 mg/dL (ref 0.61–1.24)
GFR calc Af Amer: >60 mL/min (ref >60)
GFR calc non Af Amer: >60 mL/min (ref >60)
Glucose, Bld: 77 mg/dL (ref 65–99)
Potassium: 4.1 mmol/L (ref 3.5–5.1)
Sodium: 139 mmol/L (ref 135–145)

## 2015-04-02 NOTE — Progress Notes (Signed)
Patient ID: [name], male   DOB: Jan 01, 1935, 80 y.o.   MRN: [id number]                  PROGRESS NOTE  DATE:  03/26/2015           FACILITY: [facility]                    LEVEL OF CARE:   SNF   Acute Visit                    CHIEF COMPLAINT:  Acute visit secondary to follow-up multiple issues including visit to the ER and short hospitalization late last week secondary to possible renal insufficiency hyperkalemia.  Follow-up anemia and blood pressure issues     HISTORY OF PRESENT ILLNESS:  [name] is a patien trecently admitted to the building.    He was felt to have abdominal pain secondary to constipation and ischemic colitis.  Discharged here on a seven-day course of ciprofloxacin and Flagyl.  From this regard, everything has been stable.   He has not been complaining of pain.    He also was felt to have a hypertensive urgency when he arrived in the hospital with a blood pressure of 206/77-this is stabilized however there is a feeling he may have orthostatic hypotension as noted below-      Recently, the nurses here noted a left facial droop and sent him to the ER.  A CT scan of the head was negative.       The patient had a previous MRI of the brain on November 2nd.  This showed no acute or subacute infarction.  However, he does have stable remote infarcts involving the right cerebellum, left greater than right basal ganglia, brainstem, and right greater than left corona radiata.  Matters continue to be somewhat complicated here --it was noted last week his hemoglobin had dropped down to 9.2 he did have an occult positive stool test-we did start him on a proton pump inhibitor he continues on iron at that point was decided keep him on Plavix and aspirin with his history of CVA.and CAD  Hemoglobin actually when he went to the ER late last week was improved at over 10-however subsequent hemoglobins have been 8.9 and most recently 8.1--- A GI consult is  pending.  Apparently he went to the ER last week on Thanksgiving Day when he was home with family and apparently became unresponsive apparently by the time he got to the ER he was responding at baseline workup was suspicious possibly for some renal insufficiency with a creatinine of over 1.3 potassium was 6.2 he was hydrated overnight and this did normalize updated labs have been fairly unremarkable in fact today his labs shows a creatinine of 0.77 BUN of 15 his potassium is 4.1.  There is some suspicion he does have a history of seizures this was discussed with Dr. Dellia Nims and we'll will order an EEG.  He had another issue is his blood pressure-at times he has elevated readings however today his blood pressure sitting is 98/50 and 1 we attempted to stand the  did drop to 80/46-he said he did feel weak although did not specifically complain of dizziness when he was standing.  He is on numerous agents including Norvasc 5 mg a day lisinopril 20 mg a day labetalol 200 mg 3 times a day and hydralazine 20-30 7.5 3 times a day. Previous orthostatics lying  133/65-sitting 100/60-standing 90/50.  Currently has no acute complaints is not complaining of chest pain or  dizziness although he did feel weak he said when he was standing we took his standing orthostatic blood pressure.  .           Past Medical History  Diagnosis Date  . CAD (coronary artery disease)     multi vessel. s/p recent bare metal stenting of high grade proximal R CA stenosis. residual 70% : main in-stent restenosis, with distal filling of the left anterior descending from graft flow. patent bypass graft, except for 100% occlusion of saphernous vein graft - 1st diagonal branch. 3-vessel CABG 2000.  Marland Kitchen CAD (coronary artery disease)     s/p of 95% L main artery stenosis, 4/03: cutting balloon PTCA of 80% in-stent restenosis, 7/04. preserved L ventriculr function  . Carotid bruit     bilateral. no sigificant distal abd  atherosclerosis by recent cath.   Marland Kitchen COPD (chronic obstructive pulmonary disease) (H[facility])     ongoing tobacco   . DM2 (diabetes mellitus, type 2) ([place])   . Dyslipidemia   . HTN (hypertension)   . Glaucoma   . CVA (cerebral infarction)   . Dysphagia   . Constipation       Past Surgical History  Procedure Laterality Date  . Cardiac catheterization      Family medical social history reviewed per history and physical on 03/12/2015  Current Outpatient Prescriptions on File Prior to Visit  Medication Sig Dispense Refill  . ALPRAZolam (XANAX) 0.25 MG tablet Take one tablet by mouth three times daily for anxiety (Patient taking differently: Take 0.25 mg by mouth 3 (three) times daily. for anxiety) 90 tablet 5  . amLODipine (NORVASC) 5 MG tablet Take 5 mg by mouth daily.    Marland Kitchen aspirin 325 MG tablet Take 325 mg by mouth daily.      . bisacodyl (DULCOLAX) 10 MG suppository Place 1 suppository (10 mg total) rectally daily as needed (no BM in 48 hours). 12 suppository 0  . Calcium Carbonate-Vitamin D (OS-CAL 500 + D PO) Take 1 tablet by mouth daily as needed.    . ciprofloxacin (CIPRO) 500 MG tablet Take 1 tablet (500 mg total) by mouth 2 (two) times daily. 4 tablet 0  . clopidogrel (PLAVIX) 75 MG tablet Take 75 mg by mouth daily.    Marland Kitchen docusate sodium (COLACE) 100 MG capsule Take 100 mg by mouth 2 (two) times daily.    . ferrous sulfate 325 (65 FE) MG tablet Take 325 mg by mouth daily with breakfast.    . furosemide (LASIX) 20 MG tablet Take 20 mg by mouth daily.    . isosorbide-hydrALAZINE (BIDIL) 20-37.5 MG tablet Take 1 tablet by mouth 3 (three) times daily. 90 tablet 0  . labetalol (NORMODYNE) 200 MG tablet Take 1 tablet (200 mg total) by mouth 3 (three) times daily. 90 tablet 0  . lisinopril (PRINIVIL,ZESTRIL) 20 MG tablet Take 20 mg by mouth daily.      . Maltodextrin-Xanthan Gum (RESOURCE THICKENUP CLEAR) POWD Per instructions to create nectar thick liquids (Patient not taking: Reported on  03/15/2015) 5 Can 0  . metFORMIN (GLUCOPHAGE) 500 MG tablet Take 500 mg by mouth 2 (two) times daily with a meal.    . metroNIDAZOLE (FLAGYL) 500 MG tablet Take 1 tablet (500 mg total) by mouth 3 (three) times daily. 6 tablet 0  . Multiple Vitamins-Minerals (MULTIVITAMIN PO) Take 1 tablet by mouth daily as needed.    Marland Kitchen  nicotine (NICODERM CQ - DOSED IN MG/24 HOURS) 14 mg/24hr patch Place 1 patch (14 mg total) onto the skin daily. 28 patch 0  . nystatin (MYCOSTATIN) 100000 UNIT/ML suspension Take 5 mLs by mouth 4 (four) times daily. 7 day course starting on 03/13/15    . Omega-3 Fatty Acids (FISH OIL) 1000 MG CAPS Take 1,000 mg by mouth 3 (three) times daily.    . polyethylene glycol powder (MIRALAX) powder Take 17 g by mouth 2 (two) times daily. 850 g 0  . Potassium Chloride ER 20 MEQ TBCR Take 1 tablet by mouth daily.    . pravastatin (PRAVACHOL) 80 MG tablet Take 1 tablet (80 mg total) by mouth daily at 6 PM. 30 tablet 0  . senna (SENOKOT) 8.6 MG TABS tablet Take 2 tablets (17.2 mg total) by mouth daily. 120 each 0  . tamsulosin (FLOMAX) 0.4 MG CAPS capsule Take 0.4 mg by mouth daily after supper.       LABORATORY DATA:  04/02/2015.  Sodium 139 potassium 4.1 BUN 15 creatinine 0.77.  03/31/2015.  WBC 11.8 hemoglobin 8.1 platelets 362.  Urine culture 03/30/2015-multiple species non-predominant       03/26/2015.  WBC 9.4 hemoglobin 9.2 platelets 367.  Sodium 137 potassium 4.5 BUN 23 creatinine 0.88.  November 18-----  hemoglobin was 11.4.  :     Sodium 138, BUN 13, creatinine 0.99.    White count 9, hemoglobin 11.4.    REVIEW OF SYSTEMS:   --Somewhat limited patient is a somewhat poor historian GENERAL:  The patient does not have any specific complaints Skin does not complain of rashes or itching.   HEENT:   No headache. Appears to be legally blind does have some sight in his right eye apparently  CHEST/RESPIRATORY:  No shortness of breath.   CARDIAC:  No chest pain.     GI:    no abdominal pain, diarrhea. GU-does not complaining of dysuria   NEUROLOGICAL:  He is not complaining of lateralizing weakness or dizziness-again apparently there is some thought he may have occasional seizure activity Psych-nursing does not report any issues continues to be pleasant and cooperative.  Marland Kitchen      PHYSICAL EXAMINATION:  He is afebrile pulse of 60 respirations 18 blood pressure as noted above sitting 98/50--standing 80/46 I see listed blood pressures 151/79-169/69-111/52 although these may have been taken by machine  GENERAL APPEARANCE:  The patient is not in any distress sitting comfortably in his wheelchair.  His skin is warm and dry Eyes-apparently he is legally blind he does apparently have some vision in his right eye but has significant visual impairment.               CHEST/RESPIRATORY:  Clear air entry bilaterally.    CARDIOVASCULAR:   CARDIAC: .  Heart sounds are normal.  No S3.  JVP is not elevated.--Minimal lower extremity edema      GASTROINTESTINAL:   ABDOMEN: Soft nontender with positive bowel sounds    Musculoskeletal-appears to move his extremities at baseline he ambulates in a wheelchair         NEUROLOGICAL:    CRANIAL NERVES:   He does  have left facial weakness.  This is not new.   Otherwise appears to move his extremities at baseline Psych he appears grossly alert and oriented pleasant and appropriate   ASSESSMENT/PLAN:  Anemia- Hemoglobin appears to be trending down although there is some variability-this was discussed with Dr. Dellia Nims and will discontinue his aspirin at  this point will continue the Plavix-he is on iron as well as a proton pump inhibitor will recheck this first laboratory day next week A GI consult is pending.                  Multi-infarct state.  At this point appears to be at baseline he is on Plavix --aspirin has been discontinued secondary to concerns of anemia and guaiac-positive stool       Hypertension/hypotension  Very challenging situation as noted above-will discontinue his Norvasc and reduce his hydralazine from 3 times a day down to 2 times a day and hopefully titrate him off this within the next few days-at this point continue the Labetolol 200 mg 3 times a day and lisinopril 20 mg daily this was discussed with Dr. Dellia Nims as well  Again there is some thought possibly he may be having seizure disorder contributing to unresponsive episodes-EEG is pending-also will obtain an EKG  History of renal insufficiency hyperkalemia this appears to have stabilized per lab done today-will update this as well as first laboratory day next week  I do note patient has a mildly persistent elevated white count of 11.8 which appears baseline recently urine culture did not appear to be suspicious for  any specific organism-will obtain a chest x-ray--he has been afebrile and no complaints of fever or chills    History of ischemic colitis.   This appears to be resolved he is asymptomatic.      CPT CODE: 99310--of note greater than 45 minutes spent assessing patient-discussing his status with nursing staff  And MD---reviewing his chart-and coordinating and formulating a plan of care-of note greater than 50% of time spent coordinating plan of care with extensive chart review and discussion with nursing

## 2015-04-03 ENCOUNTER — Ambulatory Visit (HOSPITAL_COMMUNITY)
Admission: RE | Admit: 2015-04-03 | Discharge: 2015-04-03 | Disposition: A | Discharge status: Home / Self Care | Payer: Medicare Other | Source: Ambulatory Visit | Attending: Internal Medicine | Admitting: Internal Medicine

## 2015-04-03 ENCOUNTER — Encounter (HOSPITAL_COMMUNITY)
Admission: RE | Admit: 2015-04-03 | Discharge: 2015-04-03 | Disposition: A | Discharge status: Home / Self Care | Payer: Medicare Other | Source: Skilled Nursing Facility | Attending: Internal Medicine | Admitting: Internal Medicine

## 2015-04-03 ENCOUNTER — Ambulatory Visit (HOSPITAL_COMMUNITY)
Admission: RE | Admit: 2015-04-03 | Discharge: 2015-04-03 | Disposition: A | Discharge status: Home / Self Care | Payer: Medicare Other | Source: Ambulatory Visit | Attending: Neurology | Admitting: Neurology

## 2015-04-03 DIAGNOSIS — R9401 Abnormal electroencephalogram [EEG]: Secondary | ICD-10-CM | POA: Diagnosis not present

## 2015-04-03 DIAGNOSIS — R05 Cough: Secondary | ICD-10-CM | POA: Diagnosis not present

## 2015-04-03 DIAGNOSIS — I10 Essential (primary) hypertension: Secondary | ICD-10-CM | POA: Diagnosis not present

## 2015-04-03 DIAGNOSIS — R4182 Altered mental status, unspecified: Secondary | ICD-10-CM | POA: Insufficient documentation

## 2015-04-03 DIAGNOSIS — R410 Disorientation, unspecified: Secondary | ICD-10-CM | POA: Insufficient documentation

## 2015-04-03 DIAGNOSIS — Z8673 Personal history of transient ischemic attack (TIA), and cerebral infarction without residual deficits: Secondary | ICD-10-CM | POA: Diagnosis not present

## 2015-04-03 DIAGNOSIS — Z79899 Other long term (current) drug therapy: Secondary | ICD-10-CM | POA: Diagnosis not present

## 2015-04-03 DIAGNOSIS — Z951 Presence of aortocoronary bypass graft: Secondary | ICD-10-CM | POA: Insufficient documentation

## 2015-04-03 DIAGNOSIS — D500 Iron deficiency anemia secondary to blood loss (chronic): Secondary | ICD-10-CM | POA: Insufficient documentation

## 2015-04-03 LAB — CBC WITH DIFFERENTIAL/PLATELET
BASOS PCT: 0 %
Basophils Absolute: 0 10*3/uL (ref 0.0–0.1)
EOS ABS: 0.3 10*3/uL (ref 0.0–0.7)
EOS PCT: 3 %
HCT: 24.6 % — ABNORMAL LOW (ref 39.0–52.0)
HEMOGLOBIN: 8.1 g/dL — AB (ref 13.0–17.0)
Lymphocytes Relative: 16 %
Lymphs Abs: 1.6 10*3/uL (ref 0.7–4.0)
MCH: 28.6 pg (ref 26.0–34.0)
MCHC: 32.9 g/dL (ref 30.0–36.0)
MCV: 86.9 fL (ref 78.0–100.0)
Monocytes Absolute: 0.7 10*3/uL (ref 0.1–1.0)
Monocytes Relative: 7 %
NEUTROS PCT: 74 %
Neutro Abs: 7.7 10*3/uL (ref 1.7–7.7)
PLATELETS: 472 10*3/uL — AB (ref 150–400)
RBC: 2.83 MIL/uL — AB (ref 4.22–5.81)
RDW: 13.4 % (ref 11.5–15.5)
WBC: 10.4 10*3/uL (ref 4.0–10.5)

## 2015-04-03 LAB — BASIC METABOLIC PANEL
Anion gap: 11 (ref 5–15)
BUN: 12 mg/dL (ref 6–20)
CALCIUM: 9.1 mg/dL (ref 8.9–10.3)
CO2: 24 mmol/L (ref 22–32)
CREATININE: 0.83 mg/dL (ref 0.61–1.24)
Chloride: 106 mmol/L (ref 101–111)
GFR calc non Af Amer: >60 mL/min (ref >60)
Glucose, Bld: 92 mg/dL (ref 65–99)
Potassium: 4.4 mmol/L (ref 3.5–5.1)
SODIUM: 141 mmol/L (ref 135–145)

## 2015-04-03 NOTE — Progress Notes (Signed)
EEG Completed; Results Pending  

## 2015-04-04 ENCOUNTER — Encounter: Payer: Self-pay | Admitting: Internal Medicine

## 2015-04-04 ENCOUNTER — Non-Acute Institutional Stay (SKILLED_NURSING_FACILITY): Payer: Medicare Other | Admitting: Internal Medicine

## 2015-04-04 DIAGNOSIS — R55 Syncope and collapse: Secondary | ICD-10-CM | POA: Diagnosis not present

## 2015-04-04 DIAGNOSIS — I10 Essential (primary) hypertension: Secondary | ICD-10-CM

## 2015-04-04 DIAGNOSIS — D649 Anemia, unspecified: Secondary | ICD-10-CM

## 2015-04-04 NOTE — Progress Notes (Signed)
Patient ID: [name], male   DOB: 1935-01-19, 80 y.o.   MRN: [id number]                   PROGRESS NOT           FACILITY: [facility]                    LEVEL OF CARE:   SNF   Acute Visit                    CHIEF COMPLAINT: Acute visit follow-up unresponsive episode.  F     HISTORY OF PRESENT ILLNESS:  [name] is a patien trecently admitted to the building.    He was felt to have abdominal pain secondary to constipation and ischemic colitis.  Discharged here on a seven-day course of ciprofloxacin and Flagyl.  From this regard, everything has been stable.   He has not been complaining of pain.    He also was felt to have a hypertensive urgency when he arrived in the hospital with a blood pressure of 206/77-this is stabilized however there is a feeling he may have orthostatic hypotension       Recently, the nurses here noted a left facial droop and sent him to the ER.  A CT scan of the head was negative.       The patient had a previous MRI of the brain on November 2nd.  This showed no acute or subacute infarction.  However, he does have stable remote infarcts involving the right cerebellum, left greater than right basal ganglia, brainstem, and right greater than left corona radiata.  Matters continue to be somewhat complicated here --it was noted last week his hemoglobin had dropped down to 9.2 he did have an occult positive stool test-we did start him on a proton pump inhibitor he continues on iron at that point was decided keep him on Plavix and aspirin with his history of CVA.and CAD  Hemoglobin actually when he went to the ER late last week was improved at over 10-however subsequent hemoglobins have been 8.9 and most recently 8.1--- A GI consult is pending.  Apparently he went to the ER last week on Thanksgiving Day when he was home with family and apparently became unresponsive apparently by the time he got to the ER he was responding at baseline  workup was suspicious possibly for some renal insufficiency with a creatinine of over 1.3 potassium was 6.2 he was hydrated overnight and this did normalize updated labs have been fairly unremarkable in fact on lab done on December 1 creatinine was 0.3 BUN 12  There is some suspicion he does have a history of seizures this was discussed with Dr. Dellia Nims and he has received an EEG results are pending  He had another issue is his blood pressure-at times he has elevated readings however His blood pressure drops significantly when he stands-going into the 09B and 35H systolically.  He is on numerous agents including Norvasc 5 mg a day lisinopril 20 mg a day labetalol 200 mg 3 times a day and hydralazine 20-30 7.5 3 times a day.--We are titrating him off the hydralazine  Apparently earlier today patient had another episode of unresponsiveness-as usual when he was taken to bed and had his legs elevated he did respond appropriately and now appears again back at his baseline-during the episode his blood pressure was taken by nursing apparently it was 70/40 when he  was standing it did go up to 153/50 when he was put back to bed.  Currently he is resting in bed comfortably he has no complaints of dizziness chest pain shortness of breath appears to be back at his baseline  .           Past Medical History  Diagnosis Date  . CAD (coronary artery disease)     multi vessel. s/p recent bare metal stenting of high grade proximal R CA stenosis. residual 70% : main in-stent restenosis, with distal filling of the left anterior descending from graft flow. patent bypass graft, except for 100% occlusion of saphernous vein graft - 1st diagonal branch. 3-vessel CABG 2000.  Marland Kitchen CAD (coronary artery disease)     s/p of 95% L main artery stenosis, 4/03: cutting balloon PTCA of 80% in-stent restenosis, 7/04. preserved L ventriculr function  . Carotid bruit     bilateral. no sigificant distal abd atherosclerosis by  recent cath.   Marland Kitchen COPD (chronic obstructive pulmonary disease) (HCC)     ongoing tobacco   . DM2 (diabetes mellitus, type 2) ([place])   . Dyslipidemia   . HTN (hypertension)   . Glaucoma   . CVA (cerebral infarction)   . Dysphagia   . Constipation       Past Surgical History  Procedure Laterality Date  . Cardiac catheterization      Family medical social history reviewed per history and physical on 03/12/2015  Current Outpatient Prescriptions on File Prior to Visit  Medication Sig Dispense Refill  . ALPRAZolam (XANAX) 0.25 MG tablet Take one tablet by mouth three times daily for anxiety (Patient taking differently: Take 0.25 mg by mouth 3 (three) times daily. for anxiety) 90 tablet 5  . amLODipine (NORVASC) 5 MG tablet Take 5 mg by mouth daily.    Marland Kitchen aspirin 325 MG tablet Take 325 mg by mouth daily.      . bisacodyl (DULCOLAX) 10 MG suppository Place 1 suppository (10 mg total) rectally daily as needed (no BM in 48 hours). 12 suppository 0  . Calcium Carbonate-Vitamin D (OS-CAL 500 + D PO) Take 1 tablet by mouth daily as needed.    . ciprofloxacin (CIPRO) 500 MG tablet Take 1 tablet (500 mg total) by mouth 2 (two) times daily. 4 tablet 0  . clopidogrel (PLAVIX) 75 MG tablet Take 75 mg by mouth daily.    Marland Kitchen docusate sodium (COLACE) 100 MG capsule Take 100 mg by mouth 2 (two) times daily.    . ferrous sulfate 325 (65 FE) MG tablet Take 325 mg by mouth daily with breakfast.    . furosemide (LASIX) 20 MG tablet Take 20 mg by mouth daily.    . isosorbide-hydrALAZINE (BIDIL) 20-37.5 MG tablet Take 1 tablet by mouth 3 (three) times daily. 90 tablet 0  . labetalol (NORMODYNE) 200 MG tablet Take 1 tablet (200 mg total) by mouth 3 (three) times daily. 90 tablet 0  . lisinopril (PRINIVIL,ZESTRIL) 20 MG tablet Take 20 mg by mouth daily.      . Maltodextrin-Xanthan Gum (RESOURCE THICKENUP CLEAR) POWD Per instructions to create nectar thick liquids (Patient not taking: Reported on 03/15/2015) 5 Can 0    . metFORMIN (GLUCOPHAGE) 500 MG tablet Take 500 mg by mouth 2 (two) times daily with a meal.    . metroNIDAZOLE (FLAGYL) 500 MG tablet Take 1 tablet (500 mg total) by mouth 3 (three) times daily. 6 tablet 0  . Multiple Vitamins-Minerals (MULTIVITAMIN PO) Take 1 tablet  by mouth daily as needed.    . nicotine (NICODERM CQ - DOSED IN MG/24 HOURS) 14 mg/24hr patch Place 1 patch (14 mg total) onto the skin daily. 28 patch 0  . nystatin (MYCOSTATIN) 100000 UNIT/ML suspension Take 5 mLs by mouth 4 (four) times daily. 7 day course starting on 03/13/15    . Omega-3 Fatty Acids (FISH OIL) 1000 MG CAPS Take 1,000 mg by mouth 3 (three) times daily.    . polyethylene glycol powder (MIRALAX) powder Take 17 g by mouth 2 (two) times daily. 850 g 0  . Potassium Chloride ER 20 MEQ TBCR Take 1 tablet by mouth daily.    . pravastatin (PRAVACHOL) 80 MG tablet Take 1 tablet (80 mg total) by mouth daily at 6 PM. 30 tablet 0  . senna (SENOKOT) 8.6 MG TABS tablet Take 2 tablets (17.2 mg total) by mouth daily. 120 each 0  . tamsulosin (FLOMAX) 0.4 MG CAPS capsule Take 0.4 mg by mouth daily after supper.       LABORATORY DATA:  04/02/2015.  Sodium 139 potassium 4.1 BUN 15 creatinine 0.77.  03/31/2015.  WBC 11.8 hemoglobin 8.1 platelets 362.  Urine culture 03/30/2015-multiple species non-predominant       03/26/2015.  WBC 9.4 hemoglobin 9.2 platelets 367.  Sodium 137 potassium 4.5 BUN 23 creatinine 0.88.  November 18-----  hemoglobin was 11.4.  :     Sodium 138, BUN 13, creatinine 0.99.    White count 9, hemoglobin 11.4.    REVIEW OF SYSTEMS:    GENERAL:  The patient does not have any specific complaints Skin does not complain of rashes or itching.   HEENT:   No headache. Appears to be legally blind does have some sight in his right eye apparently  CHEST/RESPIRATORY:  No shortness of breath.   CARDIAC:  No chest pain.   GI:    no abdominal pain, diarrhea. GU-does not complaining of dysuria    NEUROLOGICAL:  He is not complaining of lateralizing weakness or dizziness-again apparently there is some thought he may have occasional seizure activity Psych-nursing does not report any issues continues to be pleasant and cooperative.  Marland Kitchen      PHYSICAL EXAMINATION:   Temperature 98.2 pulse 60 respirations 19 blood pressure 153/50 O2 saturation is 93% on room air GENERAL APPEARANCE:  The patient is not in any distress s lying comfortably in bed r.  His skin is warm and dry Eyes-apparently he is legally blind he does apparently have some vision in his right eye but has significant visual --this appears to be baseline.               CHEST/RESPIRATORY:  Clear air entry bilaterally.    CARDIOVASCULAR:   CARDIAC: .  Heart sounds are normal.  No S3.  JVP is not elevated.--Minimal lower extremity edema      GASTROINTESTINAL:   ABDOMEN: Soft nontender with positive bowel sounds    Musculoskeletal-appears to move his extremities at baseline he ambulates in a wheelchairoften         NEUROLOGICAL:    CRANIAL NERVES:   He does  have left facial weakness.  This is not new.   Otherwise appears to move his extremities at baseline Psych he appears grossly alert and oriented pleasant and appropriate    Labs.  04/03/2015.  Sodium 141 potassium 4.4 BUN 12 creatinine 0.83.  WBC 10.4 hemoglobin 8.7 platelets 472.  EKG done did not show any changes compared to the EKG done  in the ER on Thanksgiving day.    ASSESSMENT/PLAN:  -History of unresponsive episodes recurrent  ?syncope---this appears to follow a pattern as patient becomes unresponsive-possibly orthostatic-- and then once put to bed blood pressure goes back up-- he returns to his baseline-EEG has been done will await results and neurology in put on this-clinically he appears to be back at his baseline today at this point continue to monitor. Continue orthostatic blood pressure checks    Anemia- Hemoglobin appears to be trending down  although there is some variability-this was discussed with Dr. Dellia Nims and aspirin as been discontinued he continues on Plavix with history of CVA-he also has been started on a proton pump inhibitor--updated lab work is pending including a CBC and BMP  A GI consult is pending.    Regards to hypertension hypotension-again he is being titrated off his hydralazine Norvasc has been DC'd   -at this point continue the Labetolol 200 mg 3 times a day and lisinopril 20 mg daily this has  beens discussed with Dr. Dellia Nims as well    [phone]--

## 2015-04-04 NOTE — Procedures (Signed)
Darin A. Merlene Laughter, MD     [email or web address]           HISTORY: The patient is a 80 year old who presents with confusion and altered mental status suspicious for seizure. There is a past history of right cortical infarcts.  MEDICATIONS: Scheduled Meds: Continuous Infusions: PRN Meds:.  Prior to Admission medications   Medication Sig Start Date End Date Taking? Authorizing Provider  ALPRAZolam Duanne Moron) 0.25 MG tablet Take one tablet by mouth three times daily for anxiety 03/31/15   Tiffany L Reed, DO  amLODipine (NORVASC) 5 MG tablet Take 5 mg by mouth daily.    Historical Provider, MD  aspirin 325 MG tablet Take 325 mg by mouth daily.      Historical Provider, MD  bisacodyl (DULCOLAX) 10 MG suppository Place 1 suppository (10 mg total) rectally daily as needed (no BM in 48 hours). 03/10/15   Janece Canterbury, MD  bisacodyl (DULCOLAX) 5 MG EC tablet Take 10 mg by mouth daily as needed for moderate constipation.    Historical Provider, MD  clopidogrel (PLAVIX) 75 MG tablet Take 75 mg by mouth daily.    Historical Provider, MD  docusate sodium (COLACE) 100 MG capsule Take 100 mg by mouth 2 (two) times daily.    Historical Provider, MD  ferrous sulfate 325 (65 FE) MG tablet Take 325 mg by mouth daily with breakfast.    Historical Provider, MD  furosemide (LASIX) 20 MG tablet Take 20 mg by mouth daily.    Historical Provider, MD  isosorbide-hydrALAZINE (BIDIL) 20-37.5 MG tablet Take 1 tablet by mouth 3 (three) times daily. 03/10/15   Janece Canterbury, MD  labetalol (NORMODYNE) 200 MG tablet Take 1 tablet (200 mg total) by mouth 3 (three) times daily. 03/10/15   Janece Canterbury, MD  lisinopril (PRINIVIL,ZESTRIL) 20 MG tablet Take 20 mg by mouth daily.      Historical Provider, MD  Maltodextrin-Xanthan Gum ([place]) POWD Per instructions to create nectar thick liquids Patient not taking: Reported on 03/15/2015 03/10/15   Janece Canterbury, MD  metFORMIN  (GLUCOPHAGE) 500 MG tablet Take 500 mg by mouth 2 (two) times daily with a meal.    Historical Provider, MD  nicotine (NICODERM CQ - DOSED IN MG/24 HOURS) 14 mg/24hr patch Place 1 patch (14 mg total) onto the skin daily. 03/10/15   Janece Canterbury, MD  Omega-3 Fatty Acids (FISH OIL) 1000 MG CAPS Take 1,000 mg by mouth 3 (three) times daily.    Historical Provider, MD  polyethylene glycol powder (MIRALAX) powder Take 17 g by mouth 2 (two) times daily. 03/10/15   Janece Canterbury, MD  Potassium Chloride ER 20 MEQ TBCR Take 1 tablet by mouth daily.    Historical Provider, MD  pravastatin (PRAVACHOL) 80 MG tablet Take 1 tablet (80 mg total) by mouth daily at 6 PM. 03/10/15   Janece Canterbury, MD  senna (SENOKOT) 8.6 MG TABS tablet Take 2 tablets (17.2 mg total) by mouth daily. 03/10/15   Janece Canterbury, MD  tamsulosin (FLOMAX) 0.4 MG CAPS capsule Take 0.4 mg by mouth daily after supper.    Historical Provider, MD      ANALYSIS: A 16 channel recording using standard 10 20 measurements is conducted for 21 minutes. The background activity is as high as 7-1/2 Hz which attenuates with eye opening. There is beta activity observed in the frontal areas. Awake and drowsy activities are observed. Photic simulation and hyperventilation are not carried out. There is no focal or lateralized  slowing. There is infrequent sharp wave activity that phase reverses at T3. No ongoing electrographic seizures are observed.   IMPRESSION: This recording is abnormal for the following reasons: 1. Rare left sharp wave activity which can occur relates with partial onset seizures. 2. Mild generalized slowing.       [name] A. Merlene Miller, M.D.  Diplomate, Tax adviser of Psychiatry and Neurology ( Neurology).

## 2015-04-05 ENCOUNTER — Emergency Department (HOSPITAL_COMMUNITY)
Admission: EM | Admit: 2015-04-05 | Discharge: 2015-04-05 | Disposition: A | Discharge status: Home / Self Care | Payer: Medicare Other | Attending: Emergency Medicine | Admitting: Emergency Medicine

## 2015-04-05 ENCOUNTER — Emergency Department (HOSPITAL_COMMUNITY): Payer: Medicare Other

## 2015-04-05 ENCOUNTER — Encounter (HOSPITAL_COMMUNITY): Payer: Self-pay

## 2015-04-05 DIAGNOSIS — I10 Essential (primary) hypertension: Secondary | ICD-10-CM | POA: Diagnosis not present

## 2015-04-05 DIAGNOSIS — R29810 Facial weakness: Secondary | ICD-10-CM | POA: Diagnosis not present

## 2015-04-05 DIAGNOSIS — I2510 Atherosclerotic heart disease of native coronary artery without angina pectoris: Secondary | ICD-10-CM | POA: Insufficient documentation

## 2015-04-05 DIAGNOSIS — E119 Type 2 diabetes mellitus without complications: Secondary | ICD-10-CM | POA: Insufficient documentation

## 2015-04-05 DIAGNOSIS — K5900 Constipation, unspecified: Secondary | ICD-10-CM | POA: Insufficient documentation

## 2015-04-05 DIAGNOSIS — Z7902 Long term (current) use of antithrombotics/antiplatelets: Secondary | ICD-10-CM | POA: Diagnosis not present

## 2015-04-05 DIAGNOSIS — Z87891 Personal history of nicotine dependence: Secondary | ICD-10-CM | POA: Diagnosis not present

## 2015-04-05 DIAGNOSIS — Z79899 Other long term (current) drug therapy: Secondary | ICD-10-CM | POA: Diagnosis not present

## 2015-04-05 DIAGNOSIS — Z7984 Long term (current) use of oral hypoglycemic drugs: Secondary | ICD-10-CM | POA: Diagnosis not present

## 2015-04-05 DIAGNOSIS — R1031 Right lower quadrant pain: Secondary | ICD-10-CM | POA: Diagnosis not present

## 2015-04-05 DIAGNOSIS — Z9889 Other specified postprocedural states: Secondary | ICD-10-CM | POA: Diagnosis not present

## 2015-04-05 DIAGNOSIS — E785 Hyperlipidemia, unspecified: Secondary | ICD-10-CM | POA: Diagnosis not present

## 2015-04-05 DIAGNOSIS — J449 Chronic obstructive pulmonary disease, unspecified: Secondary | ICD-10-CM | POA: Insufficient documentation

## 2015-04-05 DIAGNOSIS — H269 Unspecified cataract: Secondary | ICD-10-CM | POA: Insufficient documentation

## 2015-04-05 DIAGNOSIS — Z8673 Personal history of transient ischemic attack (TIA), and cerebral infarction without residual deficits: Secondary | ICD-10-CM | POA: Diagnosis not present

## 2015-04-05 DIAGNOSIS — R4020 Unspecified coma: Secondary | ICD-10-CM

## 2015-04-05 DIAGNOSIS — R55 Syncope and collapse: Secondary | ICD-10-CM | POA: Diagnosis not present

## 2015-04-05 DIAGNOSIS — D649 Anemia, unspecified: Secondary | ICD-10-CM | POA: Insufficient documentation

## 2015-04-05 LAB — URINALYSIS, ROUTINE W REFLEX MICROSCOPIC
GLUCOSE, UA: NEGATIVE mg/dL
HGB URINE DIPSTICK: NEGATIVE
LEUKOCYTES UA: NEGATIVE
Nitrite: NEGATIVE
PH: 5.5 (ref 5.0–8.0)
Protein, ur: NEGATIVE mg/dL

## 2015-04-05 LAB — CBC WITH DIFFERENTIAL/PLATELET
Basophils Absolute: 0 10*3/uL (ref 0.0–0.1)
Basophils Relative: 0 %
EOS PCT: 4 %
Eosinophils Absolute: 0.5 10*3/uL (ref 0.0–0.7)
HEMATOCRIT: 25 % — AB (ref 39.0–52.0)
HEMOGLOBIN: 8.2 g/dL — AB (ref 13.0–17.0)
LYMPHS ABS: 2.6 10*3/uL (ref 0.7–4.0)
LYMPHS PCT: 20 %
MCH: 29.1 pg (ref 26.0–34.0)
MCHC: 32.8 g/dL (ref 30.0–36.0)
MCV: 88.7 fL (ref 78.0–100.0)
Monocytes Absolute: 0.9 10*3/uL (ref 0.1–1.0)
Monocytes Relative: 7 %
NEUTROS ABS: 8.9 10*3/uL — AB (ref 1.7–7.7)
Neutrophils Relative %: 69 %
PLATELETS: 423 10*3/uL — AB (ref 150–400)
RBC: 2.82 MIL/uL — AB (ref 4.22–5.81)
RDW: 13.7 % (ref 11.5–15.5)
WBC: 12.8 10*3/uL — AB (ref 4.0–10.5)

## 2015-04-05 LAB — BASIC METABOLIC PANEL
ANION GAP: 11 (ref 5–15)
BUN: 18 mg/dL (ref 6–20)
CHLORIDE: 108 mmol/L (ref 101–111)
CO2: 24 mmol/L (ref 22–32)
Calcium: 9.6 mg/dL (ref 8.9–10.3)
Creatinine, Ser: 1.11 mg/dL (ref 0.61–1.24)
GFR calc Af Amer: >60 mL/min (ref >60)
GFR calc non Af Amer: >60 mL/min (ref >60)
GLUCOSE: 88 mg/dL (ref 65–99)
POTASSIUM: 4.7 mmol/L (ref 3.5–5.1)
Sodium: 143 mmol/L (ref 135–145)

## 2015-04-05 LAB — TROPONIN I: Troponin I: <0.03 ng/mL (ref <0.031)

## 2015-04-05 NOTE — ED Notes (Signed)
Pt here from [facility] for evaluation of ? Unresponsive , pseudo seizure activity and possible unresponsive

## 2015-04-05 NOTE — Discharge Instructions (Signed)
[name] continues to be anemic.   Otherwise, no life-threatening tests were noted today. Follow-up your primary care doctor.

## 2015-04-05 NOTE — ED Provider Notes (Signed)
CSN: [id number]     Arrival date & time 04/05/15  1509 History   First MD Initiated Contact with Patient 04/05/15 1523     Chief Complaint  Patient presents with  . Loss of Consciousness     (Consider location/radiation/quality/duration/timing/severity/associated sxs/prior Treatment) The history is provided by the patient and the nursing home.   [name] is a 80 y.o. male with a history significant for CVA with left sided weakness, CAD, DM and hypertension and currently residing at the [facility] after deconditioning and increased weakness after a bout of colitis presenting with a possible LOC versus "pseudoseizure" along with a drop in his blood pressure to  occuring prior to arrival.  Pt is unable to give details of symptoms prior to arriving here and seems limited with knowledge about medical history, but is awake, cooperative for exam, follows instructions and answers questions appropriately.  He denies any complaint of pain, nausea, weakness at this time. He has no history of seizure disorder.   Granddaughter now at bedside with additional information, stating pt has had similar episodes going on now for months where he will slump, start drooling, usually with eyes open and staring, then he simply starts responding again with no residual complaint of symptom or awareness that the event happened. These episodes last a minute to up to 10 minutes, last witnessed occuring Thanksgiving day when home with family.  He does not have clonic or tonic sx during these episodes.  Granddaughter states that his blood pressure was also very low when EMS checked it Thanksgiving Day.    Past Medical History  Diagnosis Date  . CAD (coronary artery disease)     multi vessel. s/p recent bare metal stenting of high grade proximal R CA stenosis. residual 70% : main in-stent restenosis, with distal filling of the left anterior descending from graft flow. patent bypass graft, except for 100% occlusion of  saphernous vein graft - 1st diagonal branch. 3-vessel CABG 2000.  Marland Kitchen CAD (coronary artery disease)     s/p of 95% L main artery stenosis, 4/03: cutting balloon PTCA of 80% in-stent restenosis, 7/04. preserved L ventriculr function  . Carotid bruit     bilateral. no sigificant distal abd atherosclerosis by recent cath.   Marland Kitchen COPD (chronic obstructive pulmonary disease) (HCC)     ongoing tobacco   . DM2 (diabetes mellitus, type 2) ([place])   . Dyslipidemia   . HTN (hypertension)   . Glaucoma   . CVA (cerebral infarction)   . Dysphagia   . Constipation    Past Surgical History  Procedure Laterality Date  . Cardiac catheterization     Family History  Problem Relation Age of Onset  . Hypertension Mother    Social History  Substance Use Topics  . Smoking status: Former Smoker    Quit date: 08/03/2014  . Smokeless tobacco: None     Comment: 50 pack year hx   . Alcohol Use: No     Comment: has not had alcohol in 30-40 years     Review of Systems  Unable to perform ROS: Mental status change  All other systems reviewed and are negative.   Pt denies any complaint of pain or sx at this time.  Reliability of his answers are questionable.  Allergies  Review of patient's allergies indicates no known allergies.  Home Medications   Prior to Admission medications   Medication Sig Start Date End Date Taking? Authorizing Provider  bisacodyl (DULCOLAX) 10 MG suppository  Place 1 suppository (10 mg total) rectally daily as needed (no BM in 48 hours). 03/10/15  Yes Janece Canterbury, MD  bisacodyl (DULCOLAX) 5 MG EC tablet Take 10 mg by mouth daily as needed for moderate constipation.   Yes Historical Provider, MD  clopidogrel (PLAVIX) 75 MG tablet Take 75 mg by mouth daily.   Yes Historical Provider, MD  docusate sodium (COLACE) 100 MG capsule Take 100 mg by mouth 2 (two) times daily.   Yes Historical Provider, MD  feeding supplement, ENSURE ENLIVE, (ENSURE ENLIVE) LIQD Take 237 mLs by mouth at  bedtime.   Yes Historical Provider, MD  ferrous sulfate 325 (65 FE) MG tablet Take 325 mg by mouth daily with breakfast.   Yes Historical Provider, MD  furosemide (LASIX) 20 MG tablet Take 20 mg by mouth daily.   Yes Historical Provider, MD  isosorbide-hydrALAZINE (BIDIL) 20-37.5 MG tablet Take 1 tablet by mouth 3 (three) times daily. Patient taking differently: Take 0.5 tablets by mouth 2 (two) times daily.  03/10/15  Yes Janece Canterbury, MD  labetalol (NORMODYNE) 200 MG tablet Take 1 tablet (200 mg total) by mouth 3 (three) times daily. 03/10/15  Yes Janece Canterbury, MD  lisinopril (PRINIVIL,ZESTRIL) 20 MG tablet Take 20 mg by mouth daily.     Yes Historical Provider, MD  Maltodextrin-Xanthan Gum (Limon) POWD Per instructions to create nectar thick liquids 03/10/15  Yes Janece Canterbury, MD  metFORMIN (GLUCOPHAGE) 500 MG tablet Take 500 mg by mouth 2 (two) times daily with a meal.   Yes Historical Provider, MD  Omega-3 Fatty Acids (FISH OIL) 1000 MG CAPS Take 1,000 mg by mouth 3 (three) times daily.   Yes Historical Provider, MD  omeprazole (PRILOSEC OTC) 20 MG tablet Take 20 mg by mouth at bedtime.   Yes Historical Provider, MD  polyethylene glycol powder (MIRALAX) powder Take 17 g by mouth 2 (two) times daily. 03/10/15  Yes Janece Canterbury, MD  Potassium Chloride ER 20 MEQ TBCR Take 1 tablet by mouth daily.   Yes Historical Provider, MD  pravastatin (PRAVACHOL) 80 MG tablet Take 1 tablet (80 mg total) by mouth daily at 6 PM. 03/10/15  Yes Janece Canterbury, MD  senna (SENOKOT) 8.6 MG TABS tablet Take 2 tablets (17.2 mg total) by mouth daily. 03/10/15  Yes Janece Canterbury, MD  tamsulosin (FLOMAX) 0.4 MG CAPS capsule Take 0.4 mg by mouth daily after supper.   Yes Historical Provider, MD  ALPRAZolam Duanne Moron) 0.25 MG tablet Take one tablet by mouth three times daily for anxiety Patient not taking: Reported on 04/05/2015 03/31/15   Tiffany L Reed, DO  nicotine (NICODERM CQ - DOSED IN MG/24  HOURS) 14 mg/24hr patch Place 1 patch (14 mg total) onto the skin daily. Patient not taking: Reported on 04/05/2015 03/10/15   Janece Canterbury, MD   BP 163/60 mmHg  Pulse 62  Temp(Src) 98.2 F (36.8 C) (Oral)  Resp 16  SpO2 99% Physical Exam  Constitutional: He appears well-developed and well-nourished. No distress.  HENT:  Head: Normocephalic and atraumatic.  Mouth/Throat: Oropharynx is clear and moist.  Eyes: Conjunctivae are normal.  Cataract left eye. Right pupil reactive.  Neck: Normal range of motion.  Cardiovascular: Normal rate, regular rhythm, normal heart sounds and intact distal pulses.   Pulmonary/Chest: Effort normal. No stridor. No respiratory distress. He has no wheezes. He has no rales. He exhibits no tenderness.  Coarse breath sounds, clears with cough.  Abdominal: Soft. Bowel sounds are normal. He exhibits no  distension. There is tenderness in the right lower quadrant. There is no rebound and no guarding.  Endorses ttp rlq without guarding, no mass, no rebound.  abd soft and nondistended.  Musculoskeletal: Normal range of motion.  Lymphadenopathy:    He has no cervical adenopathy.  Neurological: He is alert. A cranial nerve deficit is present. No sensory deficit.  Left facial droop documented on prior visits. Right grip 5/5, left 3/5.   Skin: Skin is warm and dry. He is not diaphoretic.  Psychiatric: He has a normal mood and affect.  Nursing note and vitals reviewed.   ED Course  Procedures (including critical care time) Labs Review Labs Reviewed  CBC WITH DIFFERENTIAL/PLATELET - Abnormal; Notable for the following:    WBC 12.8 (*)    RBC 2.82 (*)    Hemoglobin 8.2 (*)    HCT 25.0 (*)    Platelets 423 (*)    Neutro Abs 8.9 (*)    All other components within normal limits  URINALYSIS, ROUTINE W REFLEX MICROSCOPIC (NOT AT [facility]) - Abnormal; Notable for the following:    APPearance HAZY (*)    Specific Gravity, Urine >1.030 (*)    Bilirubin Urine SMALL (*)     Ketones, ur TRACE (*)    All other components within normal limits  BASIC METABOLIC PANEL  TROPONIN I    Imaging Review Dg Chest Portable 1 View  04/05/2015  CLINICAL DATA:  Unresponsive. Pseudo seizure activity. History of hypertension, diabetes, coronary artery disease and COPD. EXAM: PORTABLE CHEST 1 VIEW COMPARISON:  04/03/2015 and 03/30/2015. FINDINGS: 1555 hours. The heart size and mediastinal contours are stable status post CABG. There is stable mild atelectasis at both lung bases superimposed on emphysema. No edema, confluent airspace opacity or significant pleural effusion. The bones appear unchanged. IMPRESSION: Stable chest with mild bibasilar atelectasis post CABG. Electronically Signed   By: Richardean Sale M.D.   On: 04/05/2015 16:21   I have personally reviewed and evaluated these images and lab results as part of my medical decision-making.   EKG Interpretation   Date/Time:  Saturday April 05 2015 16:17:18 EST Ventricular Rate:  59 PR Interval:  150 QRS Duration: 96 QT Interval:  433 QTC Calculation: 429 R Axis:   60 Text Interpretation:  Sinus rhythm LVH with secondary repolarization  abnormality Anterior ST elevation, probably due to LVH Baseline wander in  lead(s) V1 Confirmed by Alizabeth Antonio  MD, Trenita Hulme ([id number]) on 04/05/2015 4:30:11 PM      MDM   Final diagnoses:  LOC (loss of consciousness)  Anemia, unspecified    Pt with acute on chronic intermittent transient LOC, currently at baseline per family.  Pending lab results.  Discussed with Dr Lacinda Axon who will dispo patient once labs result.      Evalee Jefferson, PA-C 04/05/15 1704   Medical screening examination/treatment/procedure(s) were conducted as a shared visit with non-physician practitioner(s) and myself.  I personally evaluated the patient during the encounter.   EKG Interpretation   Date/Time:  Saturday April 05 2015 16:17:18 EST Ventricular Rate:  59 PR Interval:  150 QRS Duration: 96 QT  Interval:  433 QTC Calculation: 429 R Axis:   60 Text Interpretation:  Sinus rhythm LVH with secondary repolarization  abnormality Anterior ST elevation, probably due to LVH Baseline wander in  lead(s) V1 Confirmed by Odie Edmonds  MD, Earnest Thalman ([id number]) on 04/05/2015 4:30:11 PM     No life-threatening condition identified. Patient rechecked at 1900.  Discussed with family. He appears to  be at baseline. Patient is still anemic.  Hemoglobin is 8.2  Nat Christen, MD 04/05/15 1902

## 2015-04-07 ENCOUNTER — Encounter (HOSPITAL_COMMUNITY)
Admission: RE | Admit: 2015-04-07 | Discharge: 2015-04-07 | Disposition: A | Discharge status: Home / Self Care | Payer: Medicare Other | Source: Skilled Nursing Facility | Attending: Internal Medicine | Admitting: Internal Medicine

## 2015-04-07 ENCOUNTER — Non-Acute Institutional Stay (SKILLED_NURSING_FACILITY): Payer: Medicare Other | Admitting: Internal Medicine

## 2015-04-07 DIAGNOSIS — R55 Syncope and collapse: Secondary | ICD-10-CM | POA: Diagnosis not present

## 2015-04-07 DIAGNOSIS — E1143 Type 2 diabetes mellitus with diabetic autonomic (poly)neuropathy: Secondary | ICD-10-CM

## 2015-04-07 DIAGNOSIS — I951 Orthostatic hypotension: Secondary | ICD-10-CM | POA: Diagnosis not present

## 2015-04-07 LAB — CBC WITH DIFFERENTIAL/PLATELET
Basophils Absolute: 0.1 10*3/uL (ref 0.0–0.1)
Basophils Relative: 0 %
EOS PCT: 3 %
Eosinophils Absolute: 0.5 10*3/uL (ref 0.0–0.7)
HCT: 25 % — ABNORMAL LOW (ref 39.0–52.0)
HEMOGLOBIN: 8.1 g/dL — AB (ref 13.0–17.0)
LYMPHS ABS: 2.4 10*3/uL (ref 0.7–4.0)
LYMPHS PCT: 18 %
MCH: 28.5 pg (ref 26.0–34.0)
MCHC: 32.4 g/dL (ref 30.0–36.0)
MCV: 88 fL (ref 78.0–100.0)
MONOS PCT: 8 %
Monocytes Absolute: 1.1 10*3/uL — ABNORMAL HIGH (ref 0.1–1.0)
Neutro Abs: 9.8 10*3/uL — ABNORMAL HIGH (ref 1.7–7.7)
Neutrophils Relative %: 71 %
PLATELETS: 444 10*3/uL — AB (ref 150–400)
RBC: 2.84 MIL/uL — AB (ref 4.22–5.81)
RDW: 13.7 % (ref 11.5–15.5)
WBC: 13.8 10*3/uL — AB (ref 4.0–10.5)

## 2015-04-07 LAB — BASIC METABOLIC PANEL
Anion gap: 7 (ref 5–15)
BUN: 24 mg/dL — AB (ref 6–20)
CHLORIDE: 111 mmol/L (ref 101–111)
CO2: 25 mmol/L (ref 22–32)
Calcium: 8.8 mg/dL — ABNORMAL LOW (ref 8.9–10.3)
Creatinine, Ser: 0.9 mg/dL (ref 0.61–1.24)
GFR calc Af Amer: >60 mL/min (ref >60)
GFR calc non Af Amer: >60 mL/min (ref >60)
Glucose, Bld: 87 mg/dL (ref 65–99)
POTASSIUM: 3.7 mmol/L (ref 3.5–5.1)
SODIUM: 143 mmol/L (ref 135–145)

## 2015-04-09 ENCOUNTER — Inpatient Hospital Stay (HOSPITAL_COMMUNITY): Admit: 2015-04-09 | Payer: Medicare Other

## 2015-04-09 ENCOUNTER — Ambulatory Visit (HOSPITAL_COMMUNITY): Payer: Medicare Other | Admitting: Speech Pathology

## 2015-04-09 NOTE — Progress Notes (Addendum)
Patient ID: [name], male   DOB: 1935-01-10, 80 y.o.   MRN: [id number]                PROGRESS NOTE  DATE:  04/07/2015        FACILITY: [facility]                   LEVEL OF CARE:   SNF   Acute Visit                  CHIEF COMPLAINT:  Review of multiple episodes of loss of consciousness.      HISTORY OF PRESENT ILLNESS:  I see that Mr. Grewe was once again seen over in the emergency room on 04/05/2015.  I had previously seen him for this on 03/26/2015 when he was found simply with his head down in his chest, unresponsive.  The staff sometimes will then have an opportunity to lay him down and he becomes more responsive again.  He has never been found bradycardic or hypotensive.    He does have a history of orthostatic hypotension and this has been measured in the facility at 902 systolic, dropping down to 70 and the patient appears to be symptomatic.    I did an EEG on him which showed some sharp waves, suggestive of a partial onset seizure.  There has never been any overt seizure activity, however.    Apparently down in therapy, he does the same thing at which point he is brought up to the floor.     PAST MEDICAL HISTORY/PROBLEM LIST:   Reviewed.  He does have a history of coronary artery disease, severe hypertension.  Recent echocardiogram showed severe LVH, but normal left ventricular function.    CURRENT MEDICATIONS:  Medication list is reviewed.    REVIEW OF SYSTEMS:   I wonder how much of this is meaningful.  However:   CHEST/RESPIRATORY:  No shortness of breath.   CARDIAC:  No chest pain or palpitations.   GI:  No abdominal pain.  No diarrhea.    MUSCULOSKELETAL:  He is not complaining of any pain.    PHYSICAL EXAMINATION:   GENERAL APPEARANCE:  I woke him up lying in bed.  He is verbal.  Can answer questions.   CHEST/RESPIRATORY:  Clear air entry bilaterally.    CARDIOVASCULAR:   CARDIAC:  Heart sounds are normal.  There are no murmurs.   No carotid  bruits.   ORTHOSTATIC REVIEW:  His blood pressure was 170/70 supine.  Pulse 80.  Standing, he drops to about 409 systolic.  I think he is symptomatic, although he does not lose consciousness.  He does not have a postural tachycardia.    GASTROINTESTINAL:   ABDOMEN:  No masses.    LIVER/SPLEEN/KIDNEYS:  No liver, no spleen.    ASSESSMENT/PLAN:                Recurrent episodes of syncope.  He does have orthostatic hypotension and he is symptomatic, although I am not quite sure that this explains all of this.  I have reviewed his last several EKGs which show some degree of ST elevation in the anterior leads, although this does not evolve.  He clearly has lateral ischemia with marked T-wave inversion laterally.  He has rhythmic movements of the right shoulder area which he does not seem to be well aware of.  I wonder whether these are focal seizures.  Finally, with his coronary artery disease, his  LVH, this could obviously be a rhythm problem although I do not see the conclusive evidence of this, either.  He has been followed by Cardiology for years.    Leukocytosis.  His lab work from today showed a white count  of 13.8, normal differential.  Potassium is 3.7.  BUN and creatinine are normal.  He has had multiple electrolyte evaluations recently.  All of this appears to be reasonably stable.    Type 2 diabetes.  On metformin.  He has not been hypoglycemic.  It is very clear that he has peripheral neuropathy and probable autonomic neuropathy.  I am just not sure that this explains his symptom complex.    I think this gentleman is going to need to go back to Cardiology.  I wonder whether he needs a loop recorder. He may need to see neurology as well ?seizure. He is orthostatic although as opposed to some patients I am not certain this explains all of his symptoms  HE NEEDS ACCURATE VITALS AT THE TIME OF ANY EVENTS

## 2015-04-18 ENCOUNTER — Encounter: Payer: Self-pay | Admitting: Cardiovascular Disease

## 2015-04-18 ENCOUNTER — Ambulatory Visit (INDEPENDENT_AMBULATORY_CARE_PROVIDER_SITE_OTHER): Payer: Medicare Other | Admitting: Cardiovascular Disease

## 2015-04-18 VITALS — BP 118/62 | HR 66 | Ht 67.0 in

## 2015-04-18 DIAGNOSIS — I6523 Occlusion and stenosis of bilateral carotid arteries: Secondary | ICD-10-CM

## 2015-04-18 DIAGNOSIS — I2489 Other forms of acute ischemic heart disease: Secondary | ICD-10-CM

## 2015-04-18 DIAGNOSIS — I25812 Atherosclerosis of bypass graft of coronary artery of transplanted heart without angina pectoris: Secondary | ICD-10-CM

## 2015-04-18 DIAGNOSIS — I248 Other forms of acute ischemic heart disease: Secondary | ICD-10-CM | POA: Diagnosis not present

## 2015-04-18 DIAGNOSIS — R9431 Abnormal electrocardiogram [ECG] [EKG]: Secondary | ICD-10-CM

## 2015-04-18 DIAGNOSIS — Z9289 Personal history of other medical treatment: Secondary | ICD-10-CM

## 2015-04-18 DIAGNOSIS — E785 Hyperlipidemia, unspecified: Secondary | ICD-10-CM

## 2015-04-18 DIAGNOSIS — I10 Essential (primary) hypertension: Secondary | ICD-10-CM

## 2015-04-18 DIAGNOSIS — Z87898 Personal history of other specified conditions: Secondary | ICD-10-CM

## 2015-04-18 DIAGNOSIS — Z8673 Personal history of transient ischemic attack (TIA), and cerebral infarction without residual deficits: Secondary | ICD-10-CM

## 2015-04-18 NOTE — Patient Instructions (Signed)
Your physician wants you to follow-up in:  6 months with Dr Virgina Jock will receive a reminder letter in the mail two months in advance. If you don't receive a letter, please call our office to schedule the follow-up appointment.  Your physician recommends that you continue on your current medications as directed. Please refer to the Current Medication list given to you today.   If you need a refill on your cardiac medications before your next appointment, please call your pharmacy.   Your physician has requested that you have a lexiscan myoview. For further information please visit [email or web address]. Please follow instruction sheet, as given.   Your physician has requested that you have a carotid duplex. This test is an ultrasound of the carotid arteries in your neck. It looks at blood flow through these arteries that supply the brain with blood. Allow one hour for this exam. There are no restrictions or special instructions.    Thank you for choosing [facility] !

## 2015-04-18 NOTE — Progress Notes (Signed)
Patient ID: [name], male   DOB: 12-31-1934, 80 y.o.   MRN: [id number]       CARDIOLOGY CONSULT NOTE  Patient ID: [name] MRN: [id number] DOB/AGE: 11-May-1934 80 y.o.  Admit date: (Not on file) Primary Physician Neale Burly, MD  Reason for Consultation: CAD, HTN  HPI: The patient is a 80 year old male who I am meeting for the first time today. 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 He also has a history of prior strokes with residual left facial droop and left-sided weakness.  He was admitted with abdominal pain and hypertensive emergency in early November 2016. Blood pressure was 206/77. Troponin was 0.97, deemed secondary to demand ischemia. An echocardiogram showed vigorous left ventricular systolic function with severe LVH, EF 80%, with grade 1 diastolic dysfunction. His abdominal pain was deemed secondary to severe constipation and possibly ischemic colitis. Dr. Marlou Porch was consulted via phone.  He has not had a recent ischemic evaluation.  Denies chest pain, palpitations, leg swelling, and shortness of breath.  ECG on 04/05/15 demonstrated sinus rhythm with LVH and consequent repolarization abnormalities.    No Known Allergies  Current Outpatient Prescriptions  Medication Sig Dispense Refill  . clopidogrel (PLAVIX) 75 MG tablet Take 75 mg by mouth daily.    . feeding supplement, ENSURE ENLIVE, (ENSURE ENLIVE) LIQD Take 237 mLs by mouth at bedtime.    . ferrous sulfate 325 (65 FE) MG tablet Take 325 mg by mouth daily with breakfast.    . furosemide (LASIX) 20 MG tablet Take 20 mg by mouth daily. Reported on 04/18/2015    . labetalol (NORMODYNE) 200 MG tablet Take 1 tablet (200 mg total) by mouth 3 (three) times  daily. 90 tablet 0  . lisinopril (PRINIVIL,ZESTRIL) 20 MG tablet Take 20 mg by mouth daily.      . metFORMIN (GLUCOPHAGE) 500 MG tablet Take 500 mg by mouth 2 (two) times daily with a meal.    . Omega-3 Fatty Acids (FISH OIL) 1000 MG CAPS Take 1,000 mg by mouth 3 (three) times daily.    Marland Kitchen omeprazole (PRILOSEC OTC) 20 MG tablet Take 20 mg by mouth at bedtime.    . polyethylene glycol powder (MIRALAX) powder Take 17 g by mouth 2 (two) times daily. 850 g 0  . Potassium Chloride ER 20 MEQ TBCR Take 1 tablet by mouth daily.    . pravastatin (PRAVACHOL) 80 MG tablet Take 1 tablet (80 mg total) by mouth daily at 6 PM. 30 tablet 0  . senna (SENOKOT) 8.6 MG TABS tablet Take 2 tablets (17.2 mg total) by mouth daily. 120 each 0  . tamsulosin (FLOMAX) 0.4 MG CAPS capsule Take 0.4 mg by mouth daily after supper.    . ALPRAZolam (XANAX) 0.25 MG tablet Take one tablet by mouth three times daily for anxiety (Patient not taking: Reported on 04/05/2015) 90 tablet 5  . bisacodyl (DULCOLAX) 10 MG suppository Place 1 suppository (10 mg total) rectally daily as needed (no BM in 48 hours). 12 suppository 0  . bisacodyl (DULCOLAX) 5 MG EC tablet Take 10 mg by mouth daily as needed for moderate constipation.    . isosorbide-hydrALAZINE (BIDIL) 20-37.5 MG tablet Take 1 tablet by mouth 3 (three) times daily. (Patient taking differently: Take 0.5 tablets  by mouth 2 (two) times daily. ) 90 tablet 0  . Maltodextrin-Xanthan Gum ([place]) POWD Per instructions to create nectar thick liquids 5 Can 0   No current facility-administered medications for this visit.    Past Medical History  Diagnosis Date  . CAD (coronary artery disease)     multi vessel. s/p recent bare metal stenting of high grade proximal R CA stenosis. residual 70% : main in-stent restenosis, with distal filling of the left anterior descending from graft flow. patent bypass graft, except for 100% occlusion of saphernous vein graft - 1st diagonal  branch. 3-vessel CABG 2000.  Marland Kitchen CAD (coronary artery disease)     s/p of 95% L main artery stenosis, 4/03: cutting balloon PTCA of 80% in-stent restenosis, 7/04. preserved L ventriculr function  . Carotid bruit     bilateral. no sigificant distal abd atherosclerosis by recent cath.   Marland Kitchen COPD (chronic obstructive pulmonary disease) (HCC)     ongoing tobacco   . DM2 (diabetes mellitus, type 2) ([place])   . Dyslipidemia   . HTN (hypertension)   . Glaucoma   . CVA (cerebral infarction)   . Dysphagia   . Constipation     Past Surgical History  Procedure Laterality Date  . Cardiac catheterization      Social History   Social History  . Marital Status: Married    Spouse Name: N/A  . Number of Children: N/A  . Years of Education: N/A   Occupational History  . Not on file.   Social History Main Topics  . Smoking status: Former Smoker    Quit date: 08/03/2014  . Smokeless tobacco: Not on file     Comment: 50 pack year hx   . Alcohol Use: No     Comment: has not had alcohol in 30-40 years   . Drug Use: No  . Sexual Activity: Not on file   Other Topics Concern  . Not on file   Social History Narrative     No family history of premature CAD in 1st degree relatives.  Prior to Admission medications   Medication Sig Start Date End Date Taking? Authorizing Provider  clopidogrel (PLAVIX) 75 MG tablet Take 75 mg by mouth daily.   Yes Historical Provider, MD  feeding supplement, ENSURE ENLIVE, (ENSURE ENLIVE) LIQD Take 237 mLs by mouth at bedtime.   Yes Historical Provider, MD  ferrous sulfate 325 (65 FE) MG tablet Take 325 mg by mouth daily with breakfast.   Yes Historical Provider, MD  furosemide (LASIX) 20 MG tablet Take 20 mg by mouth daily. Reported on 04/18/2015   Yes Historical Provider, MD  labetalol (NORMODYNE) 200 MG tablet Take 1 tablet (200 mg total) by mouth 3 (three) times daily. 03/10/15  Yes Janece Canterbury, MD  lisinopril (PRINIVIL,ZESTRIL) 20 MG tablet Take 20 mg by  mouth daily.     Yes Historical Provider, MD  metFORMIN (GLUCOPHAGE) 500 MG tablet Take 500 mg by mouth 2 (two) times daily with a meal.   Yes Historical Provider, MD  Omega-3 Fatty Acids (FISH OIL) 1000 MG CAPS Take 1,000 mg by mouth 3 (three) times daily.   Yes Historical Provider, MD  omeprazole (PRILOSEC OTC) 20 MG tablet Take 20 mg by mouth at bedtime.   Yes Historical Provider, MD  polyethylene glycol powder (MIRALAX) powder Take 17 g by mouth 2 (two) times daily. 03/10/15  Yes Janece Canterbury, MD  Potassium Chloride ER 20 MEQ TBCR Take 1 tablet by mouth  daily.   Yes Historical Provider, MD  pravastatin (PRAVACHOL) 80 MG tablet Take 1 tablet (80 mg total) by mouth daily at 6 PM. 03/10/15  Yes Janece Canterbury, MD  senna (SENOKOT) 8.6 MG TABS tablet Take 2 tablets (17.2 mg total) by mouth daily. 03/10/15  Yes Janece Canterbury, MD  tamsulosin (FLOMAX) 0.4 MG CAPS capsule Take 0.4 mg by mouth daily after supper.   Yes Historical Provider, MD  ALPRAZolam Duanne Moron) 0.25 MG tablet Take one tablet by mouth three times daily for anxiety Patient not taking: Reported on 04/05/2015 03/31/15   Tiffany L Reed, DO  bisacodyl (DULCOLAX) 10 MG suppository Place 1 suppository (10 mg total) rectally daily as needed (no BM in 48 hours). 03/10/15   Janece Canterbury, MD  bisacodyl (DULCOLAX) 5 MG EC tablet Take 10 mg by mouth daily as needed for moderate constipation.    Historical Provider, MD  isosorbide-hydrALAZINE (BIDIL) 20-37.5 MG tablet Take 1 tablet by mouth 3 (three) times daily. Patient taking differently: Take 0.5 tablets by mouth 2 (two) times daily.  03/10/15   Janece Canterbury, MD  Maltodextrin-Xanthan Gum ([place]) POWD Per instructions to create nectar thick liquids 03/10/15   Janece Canterbury, MD     Review of systems complete and found to be negative unless listed above in HPI     Physical exam Blood pressure 118/62, pulse 66, height '5\' 7"'$  (1.702 m), SpO2 95 %. General: NAD Neck: No  JVD, no thyromegaly or thyroid nodule.  Lungs: Clear to auscultation bilaterally with normal respiratory effort. CV: Nondisplaced PMI. Regular rate and rhythm, normal S1/S2, no A6/T0, 1/6 systolic murmur heard throughout precordium.  No peripheral edema.  No carotid bruit.    Abdomen: Soft, nontender, no distention.  Neurologic: Alert and oriented.  Psych: Normal affect.  ECG: Most recent ECG reviewed.  Labs:   Lab Results  Component Value Date   WBC 13.8* 04/07/2015   HGB 8.1* 04/07/2015   HCT 25.0* 04/07/2015   MCV 88.0 04/07/2015   PLT 444* 04/07/2015   No results for input(s): NA, K, CL, CO2, BUN, CREATININE, CALCIUM, PROT, BILITOT, ALKPHOS, ALT, AST, GLUCOSE in the last 168 hours.  Invalid input(s): LABALBU Lab Results  Component Value Date   CKTOTAL 47 07/17/2007   CKMB 0.8 07/17/2007   TROPONINI <0.03 04/05/2015    Lab Results  Component Value Date   CHOL 131 03/06/2015   CHOL  11/23/2007    113        ATP III CLASSIFICATION:  <200     mg/dL   Desirable  200-239  mg/dL   Borderline High  >=240    mg/dL   High   CHOL  07/17/2007    133        ATP III CLASSIFICATION:  <200     mg/dL   Desirable  200-239  mg/dL   Borderline High  >=240    mg/dL   High   Lab Results  Component Value Date   HDL 43 03/06/2015   HDL 17* 11/23/2007   HDL 18* 07/17/2007   Lab Results  Component Value Date   LDLCALC 81 03/06/2015   [place]  11/23/2007    46        Total Cholesterol/HDL:CHD Risk Coronary Heart Disease Risk Table                     Men   Women  1/2 Average Risk   3.4   3.3  [place]  07/17/2007    94        Total Cholesterol/HDL:CHD Risk Coronary Heart Disease Risk Table                     Men   Women  1/2 Average Risk   3.4   3.3   Lab Results  Component Value Date   TRIG 37 03/06/2015   TRIG 252* 11/23/2007   TRIG 104 07/17/2007   Lab Results  Component Value Date   CHOLHDL 3.0 03/06/2015   CHOLHDL 6.6 11/23/2007   CHOLHDL 7.4 07/17/2007    No results found for: LDLDIRECT       Studies: No results found.  ASSESSMENT AND PLAN:  1. CAD: Symptomatically stable. Will obtain a Lexiscan Cardiolite stress test for ischemic surveillance purposes and given the elevated troponin/demand ischemia seen in November. Continue Plavix, labetalol, lisinopril, Bidil, and pravastatin.  2. Essential HTN: Controlled on labetalol and lisinopril along with Bidil. No changes.  3. Hyperlipidemia: Continue pravastatin 80 mg.  4. History of CVA: On Plavix.  5. Carotid artery stenosis: Will obtain carotid Dopplers.  Dispo: f/u 6 months.   Signed: Kate Sable, M.D., F.A.C.C.  04/18/2015, 1:53 PM

## 2015-04-23 ENCOUNTER — Other Ambulatory Visit: Payer: Self-pay

## 2015-04-23 ENCOUNTER — Encounter: Payer: Medicare Other | Admitting: Gastroenterology

## 2015-04-23 ENCOUNTER — Ambulatory Visit (INDEPENDENT_AMBULATORY_CARE_PROVIDER_SITE_OTHER): Payer: Medicare Other | Admitting: Gastroenterology

## 2015-04-23 DIAGNOSIS — R195 Other fecal abnormalities: Secondary | ICD-10-CM

## 2015-04-23 DIAGNOSIS — D649 Anemia, unspecified: Secondary | ICD-10-CM | POA: Diagnosis not present

## 2015-04-23 DIAGNOSIS — D6489 Other specified anemias: Secondary | ICD-10-CM

## 2015-04-23 MED ORDER — NA SULFATE-K SULFATE-MG SULF 17.5-3.13-1.6 GM/177ML PO SOLN: 1.0000 | ORAL | Status: DC

## 2015-04-23 NOTE — Progress Notes (Signed)
   Subjective:    Patient ID: [name], male    DOB: 08-Mar-1935, 80 y.o.   MRN: [id number]  Neale Burly, MD  HPI   Past Medical History  Diagnosis Date  . CAD (coronary artery disease)     multi vessel. s/p recent bare metal stenting of high grade proximal R CA stenosis. residual 70% : main in-stent restenosis, with distal filling of the left anterior descending from graft flow. patent bypass graft, except for 100% occlusion of saphernous vein graft - 1st diagonal branch. 3-vessel CABG 2000.  Marland Kitchen CAD (coronary artery disease)     s/p of 95% L main artery stenosis, 4/03: cutting balloon PTCA of 80% in-stent restenosis, 7/04. preserved L ventriculr function  . Carotid bruit     bilateral. no sigificant distal abd atherosclerosis by recent cath.   Marland Kitchen COPD (chronic obstructive pulmonary disease) (HCC)     ongoing tobacco   . DM2 (diabetes mellitus, type 2) ([place])   . Dyslipidemia   . HTN (hypertension)   . Glaucoma   . CVA (cerebral infarction)   . Dysphagia   . Constipation    Past Surgical History  Procedure Laterality Date  . Cardiac catheterization      No Known Allergies   Review of Systems PER HPI OTHERWISE ALL SYSTEMS ARE NEGATIVE.    Objective:   Physical Exam        Assessment & Plan:

## 2015-04-23 NOTE — Progress Notes (Signed)
ON RECALL  °

## 2015-04-23 NOTE — Progress Notes (Signed)
CC'ED TO PCP 

## 2015-04-23 NOTE — Assessment & Plan Note (Addendum)
ETIOLOGY UNCLEAR. DIFFERENTIAL DIAGNOSIS INCLUDES HEMORRHOIDS, COLON POLYPS, AVMs, H PYLORI GASTRITIS & LESS LIKELY GASTRIC OR COLON CA.  TCS/EGD WITHIN EXT 2-3 WEEKS. DISCUSSED PROCEDURE, & BENEFITS WITH PATIENT. CHECK FERRITING TODAY HOLD IRON FOR 7 DAYS FULL LIQUID DIET THE DAY BEFORE SUPREP WITH DULCOLAX THE DAY BEFORE FOLLOW UP IN 4 MOS.

## 2015-04-23 NOTE — Patient Instructions (Addendum)
COMPLETE COLONOSCOPY/EGD IN 2-3 WEEKS WITH SUPREP.   FULL LIQUID DIET & SUPREP SPLIT DOSING ON THE DAY BEFORE.  DULCOLAX 10 MG AT 3 PM AND 7 PM ON DAY BEFORE ENDOSCOPY.  HOLD IRON 7 DAYS BEFORE ENDOSCOPY.  CONTINUE GLUCOPHAGE.   WILL CONTACT DAUGHTER SHIRLEY FOR CONSENT.  FOLLOW UP IN 4 MOS.

## 2015-04-23 NOTE — Progress Notes (Signed)
Subjective:    Patient ID: [name], male    DOB: [birth year], 80 y.o.   MRN: [id number]  Neale Burly, MD Adalberto Cole, MD  HPI PT SEEN IN ED FOR SYNCOPE. TAKES PLAVIX. NOTED TO HAVE ANEMIA. STOOLS ARE HEME POSITIVE BUT NO BRBPR OR MELENA '@TO'$  NURSING(BETTY/MAVIS). DAUGHTER SHIRLEY CONSENTS FOR HIS PROCEDURES. PT ABLE TO GIVE HISTORY. HAD TCS REMOTELY. NO EGD EVER. PT DENIES FEVER, CHILLS, HEMATOCHEZIA, nausea, vomiting, melena, diarrhea, CHEST PAIN, SHORTNESS OF BREATH,  CHANGE IN BOWEL IN HABITS, constipation, abdominal pain, problems swallowing, OR heartburn or indigestion.  DAUGHTER UNAVAILABLE FOR VISIT. REVIEWED EMR AND SPOKE WITH NURSING STAFF AT [facility].  Past Medical History  Diagnosis Date  . CAD (coronary artery disease)     multi vessel. s/p recent bare metal stenting of high grade proximal R CA stenosis. residual 70% : main in-stent restenosis, with distal filling of the left anterior descending from graft flow. patent bypass graft, except for 100% occlusion of saphernous vein graft - 1st diagonal branch. 3-vessel CABG 2000.  Marland Kitchen CAD (coronary artery disease)     s/p of 95% L main artery stenosis, 4/03: cutting balloon PTCA of 80% in-stent restenosis, 7/04. preserved L ventriculr function  . Carotid bruit     bilateral. no sigificant distal abd atherosclerosis by recent cath.   Marland Kitchen COPD (chronic obstructive pulmonary disease) (HCC)     ongoing tobacco   . DM2 (diabetes mellitus, type 2) ([place])   . Dyslipidemia   . HTN (hypertension)   . Glaucoma   . CVA (cerebral infarction)   . Dysphagia   . Constipation     Past Surgical History  Procedure Laterality Date  . Cardiac catheterization     No Known Allergies  Current Outpatient Prescriptions  Medication Sig Dispense Refill  . ALPRAZolam (XANAX) 0.25 MG tablet Take one tablet by mouth three times daily for anxiety (Patient not taking: Reported on 04/05/2015)    . bisacodyl (DULCOLAX) 10 MG  suppository Place 1 suppository (10 mg total) rectally daily as needed (no BM in 48 hours).    . bisacodyl (DULCOLAX) 5 MG EC tablet Take 10 mg by mouth daily as needed for moderate constipation.    . clopidogrel (PLAVIX) 75 MG tablet Take 75 mg by mouth daily.    . feeding supplement, ENSURE ENLIVE, (ENSURE ENLIVE) LIQD Take 237 mLs by mouth at bedtime.    . ferrous sulfate 325 (65 FE) MG tablet Take 325 mg by mouth daily with breakfast. SINCE NOV 8   . furosemide (LASIX) 20 MG tablet Take 20 mg by mouth daily. Reported on 04/18/2015    . isosorbide-hydrALAZINE (BIDIL) 20-37.5 MG tablet Take 1 tablet by mouth 3 (three) times daily. (Patient taking differently: Take 0.5 tablets by mouth 2 (two) times daily. )    . labetalol (NORMODYNE) 200 MG tablet Take 1 tablet (200 mg total) by mouth 3 (three) times daily.    Marland Kitchen lisinopril (PRINIVIL,ZESTRIL) 20 MG tablet Take 20 mg by mouth daily.      . Maltodextrin-Xanthan Gum (RESOURCE THICKENUP CLEAR) POWD Per instructions to create nectar thick liquids    . metFORMIN (GLUCOPHAGE) 500 MG tablet Take 500 mg by mouth 2 (two) times daily with a meal.    . Omega-3 Fatty Acids (FISH OIL) 1000 MG CAPS Take 1,000 mg by mouth 3 (three) times daily.    Marland Kitchen omeprazole (PRILOSEC OTC) 20 MG tablet Take 20 mg by mouth at bedtime.    Marland Kitchen  polyethylene glycol powder (MIRALAX) powder Take 17 g by mouth 2 (two) times daily.    . Potassium Chloride ER 20 MEQ TBCR Take 1 tablet by mouth daily.    . pravastatin (PRAVACHOL) 80 MG tablet Take 1 tablet (80 mg total) by mouth daily at 6 PM.    . senna (SENOKOT) 8.6 MG TABS tablet Take 2 tablets (17.2 mg total) by mouth daily.    . tamsulosin (FLOMAX) 0.4 MG CAPS capsule Take 0.4 mg by mouth daily after supper.     No current facility-administered medications for this visit.   Family History  Problem Relation Age of Onset  . Hypertension Mother    Social History  Substance Use Topics  . Smoking status: Former Smoker    Quit  date: 08/03/2014  . Smokeless tobacco: Not on file     Comment: 50 pack year hx   . Alcohol Use: No     Comment: has not had alcohol in 30-40 years    LIVES AT [place].  Review of Systems PER HPI OTHERWISE ALL SYSTEMS ARE NEGATIVE.     Objective:   Physical Exam  Constitutional: He is oriented to person, place, and time. He appears well-developed and well-nourished. No distress.  HENT:  Head: Normocephalic and atraumatic.  Mouth/Throat: Oropharynx is clear and moist. No oropharyngeal exudate.  Eyes: No scleral icterus.  LEFT CORNEA OPACIFIED  Neck: Normal range of motion. Neck supple.  Cardiovascular: Normal rate, regular rhythm and normal heart sounds.   Pulmonary/Chest: Effort normal and breath sounds normal. No respiratory distress.  Abdominal: Soft. Bowel sounds are normal. He exhibits no distension. There is no tenderness.  EXAM LIMITED-PT IN [facility]  Musculoskeletal: He exhibits no edema.  EXAM LIMITED-PT IN WHEELCHAIR  Lymphadenopathy:    He has no cervical adenopathy.  Neurological: He is alert and oriented to person, place, and time.  BLIND IN LEFT EYE. NO  NEW FOCAL DEFICITS  Psychiatric: He has a normal mood and affect.  Vitals reviewed.     Assessment & Plan:

## 2015-05-09 ENCOUNTER — Encounter: Payer: Medicare Other | Admitting: Cardiology

## 2015-05-09 ENCOUNTER — Encounter (HOSPITAL_COMMUNITY)
Admission: RE | Admit: 2015-05-09 | Discharge: 2015-05-09 | Disposition: A | Discharge status: Home / Self Care | Payer: Medicare Other | Source: Ambulatory Visit | Attending: Cardiovascular Disease | Admitting: Cardiovascular Disease

## 2015-05-09 ENCOUNTER — Inpatient Hospital Stay (HOSPITAL_COMMUNITY): Admission: RE | Admit: 2015-05-09 | Payer: Medicare Other | Source: Ambulatory Visit

## 2015-05-09 ENCOUNTER — Encounter (HOSPITAL_BASED_OUTPATIENT_CLINIC_OR_DEPARTMENT_OTHER)
Admission: RE | Admit: 2015-05-09 | Discharge: 2015-05-09 | Disposition: A | Discharge status: Home / Self Care | Payer: Medicare Other | Source: Ambulatory Visit | Attending: Cardiovascular Disease | Admitting: Cardiovascular Disease

## 2015-05-09 ENCOUNTER — Ambulatory Visit (HOSPITAL_COMMUNITY)
Admission: RE | Admit: 2015-05-09 | Discharge: 2015-05-09 | Disposition: A | Discharge status: Home / Self Care | Payer: Medicare Other | Source: Ambulatory Visit | Attending: Cardiovascular Disease | Admitting: Cardiovascular Disease

## 2015-05-09 ENCOUNTER — Encounter (HOSPITAL_COMMUNITY): Payer: Self-pay

## 2015-05-09 ENCOUNTER — Non-Acute Institutional Stay (SKILLED_NURSING_FACILITY): Payer: Medicare Other | Admitting: Internal Medicine

## 2015-05-09 DIAGNOSIS — D649 Anemia, unspecified: Secondary | ICD-10-CM | POA: Diagnosis not present

## 2015-05-09 DIAGNOSIS — I248 Other forms of acute ischemic heart disease: Secondary | ICD-10-CM

## 2015-05-09 DIAGNOSIS — I6523 Occlusion and stenosis of bilateral carotid arteries: Secondary | ICD-10-CM | POA: Diagnosis not present

## 2015-05-09 DIAGNOSIS — R634 Abnormal weight loss: Secondary | ICD-10-CM

## 2015-05-09 DIAGNOSIS — I25812 Atherosclerosis of bypass graft of coronary artery of transplanted heart without angina pectoris: Secondary | ICD-10-CM

## 2015-05-09 LAB — NM MYOCAR MULTI W/SPECT W/WALL MOTION / EF
CHL CUP NUCLEAR SRS: 4
CHL CUP NUCLEAR SSS: 7
LHR: 0.26
LV sys vol: 25 mL
LVDIAVOL: 49 mL
NUC STRESS TID: 1.04
Peak HR: 83 {beats}/min
Rest HR: 60 {beats}/min
SDS: 3

## 2015-05-09 MED ORDER — TECHNETIUM TC 99M SESTAMIBI - CARDIOLITE
30.0000 | Freq: Once | INTRAVENOUS | Status: AC | PRN
  Administered 2015-05-09: 30 via INTRAVENOUS

## 2015-05-09 MED ORDER — TECHNETIUM TC 99M SESTAMIBI GENERIC - CARDIOLITE
10.0000 | Freq: Once | INTRAVENOUS | Status: AC | PRN
  Administered 2015-05-09: 10 via INTRAVENOUS

## 2015-05-09 MED ORDER — REGADENOSON 0.4 MG/5ML IV SOLN
INTRAVENOUS | Status: AC
  Administered 2015-05-09: 0.4 mg via INTRAVENOUS
  Filled 2015-05-09: qty 5

## 2015-05-09 MED ORDER — SODIUM CHLORIDE 0.9 % IJ SOLN
INTRAMUSCULAR | Status: AC
  Administered 2015-05-09: 10 mL via INTRAVENOUS
  Filled 2015-05-09: qty 3

## 2015-05-09 NOTE — Progress Notes (Signed)
Patient ID: [name], male   DOB: 04-Sep-1934, 81 y.o.   MRN: [id number]                    PROGRESS NOT           FACILITY: [facility]                    LEVEL OF CARE:   SNF   Acute Visit                    CHIEF COMPLAINT: Acute visit secondary to weight loss  F     HISTORY OF PRESENT ILLNESS:  [name] is a patien trecently admitted to the building.    He was felt to have abdominal pain secondary to constipation and ischemic colitis.  Discharged here on a seven-day course of ciprofloxacin and Flagyl.  From this regard, everything has been stable.   He has not been complaining of pain.    He also was felt to have a hypertensive urgency when he arrived in the hospital with a blood pressure of 206/77-this is stabilized however there is a feeling he may have orthostatic hypotension       Recently, the nurses here noted a left facial droop and sent him to the ER.  A CT scan of the head was negative.       The patient had a previous MRI of the brain on November 2nd.  This showed no acute or subacute infarction.  However, he does have stable remote infarcts involving the right cerebellum, left greater than right basal ganglia, brainstem, and right greater than left corona radiata.  Matters continue to be somewhat complicated here --it was noted his hemoglobin had dropped down to 9.2 he did have an occult positive stool test-we did start him on a proton pump inhibitor he continues on iron at that point was decided keep him on Plavix and aspirin with his history of CVA.and CAD  Hemoglobin on December 5 was 8.1 which has been relatively stable lately-he has been seen by GI and they are apparently planning an EGD at some point   Patient has had episodes of unresponsiveness however he quickly returned to baseline when  put to bed and his legs are elevated-he has been seen by cardiology and actually did have a stress test done apparently earlier today results are  pending--actually has not had these episodes for a while   There is some suspicion he does have a history of seizures     It has been noted the patient has a somewhat poor appetite appears to be losing weight weight most recently 142-it appears he was 148.6 back on December 12.  He does not complain of dysphagia or difficulty swallowing or abdominal discomfort says he just eats what he wants-family has been encouraged to bring in food that he likes in hopes this will help.  We also tested just started him on Remeron 7.5 mg a day with hopes this will spur some appetite as well  .           Past Medical History  Diagnosis Date  . CAD (coronary artery disease)     multi vessel. s/p recent bare metal stenting of high grade proximal R CA stenosis. residual 70% : main in-stent restenosis, with distal filling of the left anterior descending from graft flow. patent bypass graft, except for 100% occlusion of saphernous vein graft - 1st diagonal  branch. 3-vessel CABG 2000.  Marland Kitchen CAD (coronary artery disease)     s/p of 95% L main artery stenosis, 4/03: cutting balloon PTCA of 80% in-stent restenosis, 7/04. preserved L ventriculr function  . Carotid bruit     bilateral. no sigificant distal abd atherosclerosis by recent cath.   Marland Kitchen COPD (chronic obstructive pulmonary disease) (HCC)     ongoing tobacco   . DM2 (diabetes mellitus, type 2) ([place])   . Dyslipidemia   . HTN (hypertension)   . Glaucoma   . CVA (cerebral infarction)   . Dysphagia   . Constipation       Past Surgical History  Procedure Laterality Date  . Cardiac catheterization      Family medical social history reviewed per history and physical on 03/12/2015  Current Outpatient Prescriptions on File Prior to Visit  Medication Sig Dispense Refill  . ALPRAZolam (XANAX) 0.25 MG tablet Take one tablet by mouth three times daily for anxiety (Patient taking differently: Take 0.25 mg by mouth 3 (three) times daily. for anxiety) 90  tablet 5  . amLODipine (NORVASC) 5 MG tablet Take 5 mg by mouth daily.    Marland Kitchen aspirin 325 MG tablet Take 325 mg by mouth daily.      . bisacodyl (DULCOLAX) 10 MG suppository Place 1 suppository (10 mg total) rectally daily as needed (no BM in 48 hours). 12 suppository 0  . Calcium Carbonate-Vitamin D (OS-CAL 500 + D PO) Take 1 tablet by mouth daily as needed.    . ciprofloxacin (CIPRO) 500 MG tablet Take 1 tablet (500 mg total) by mouth 2 (two) times daily. 4 tablet 0  . clopidogrel (PLAVIX) 75 MG tablet Take 75 mg by mouth daily.    Marland Kitchen docusate sodium (COLACE) 100 MG capsule Take 100 mg by mouth 2 (two) times daily.    . ferrous sulfate 325 (65 FE) MG tablet Take 325 mg by mouth daily with breakfast.    . furosemide (LASIX) 20 MG tablet Take 20 mg by mouth daily.    . isosorbide-hydrALAZINE (BIDIL) 20-37.5 MG tablet Take 1 tablet by mouth 3 (three) times daily. 90 tablet 0  . labetalol (NORMODYNE) 200 MG tablet Take 1 tablet (200 mg total) by mouth 3 (three) times daily. 90 tablet 0  . lisinopril (PRINIVIL,ZESTRIL) 20 MG tablet Take 20 mg by mouth daily.      . Maltodextrin-Xanthan Gum (RESOURCE THICKENUP CLEAR) POWD Per instructions to create nectar thick liquids (Patient not taking: Reported on 03/15/2015) 5 Can 0  . metFORMIN (GLUCOPHAGE) 500 MG tablet Take 500 mg by mouth 2 (two) times daily with a meal.    .    6 tablet 0  . Multiple Vitamins-Minerals (MULTIVITAMIN PO) Take 1 tablet by mouth daily as needed.    . nicotine (NICODERM CQ - DOSED IN MG/24 HOURS) 14 mg/24hr patch Place 1 patch (14 mg total) onto the skin daily. 28 patch 0         . Omega-3 Fatty Acids (FISH OIL) 1000 MG CAPS Take 1,000 mg by mouth 3 (three) times daily.    . polyethylene glycol powder (MIRALAX) powder Take 17 g by mouth 2 (two) times daily. 850 g 0  .       . pravastatin (PRAVACHOL) 80 MG tablet Take 1 tablet (80 mg total) by mouth daily at 6 PM. 30 tablet 0  . senna (SENOKOT) 8.6 MG TABS tablet Take 2  tablets (17.2 mg total) by mouth daily. 120 each 0  .  tamsulosin (FLOMAX) 0.4 MG CAPS capsule Take 0.4 mg by mouth daily after supper.       LABORATORY DATA  04/07/2015.  Sodium 143 potassium 3.7 BUN 24 creatinine 0.9.  WBC 13.8 hemoglobin 8.1 platelets 444.  :  04/02/2015.  Sodium 139 potassium 4.1 BUN 15 creatinine 0.77.  03/31/2015.  WBC 11.8 hemoglobin 8.1 platelets 362.  Urine culture 03/30/2015-multiple species non-predominant       03/26/2015.  WBC 9.4 hemoglobin 9.2 platelets 367.  Sodium 137 potassium 4.5 BUN 23 creatinine 0.88.  November 18-----  hemoglobin was 11.4.  :     Sodium 138, BUN 13, creatinine 0.99.    White count 9, hemoglobin 11.4.    REVIEW OF SYSTEMS:    GENERAL:  The patient does not have any specific complaints Skin does not complain of rashes or itching.   HEENT:   No headache. Appears to be legally blind does have some sight in his right eye apparently  CHEST/RESPIRATORY:  No shortness of breath.   CARDIAC:  No chest pain.   GI:    no abdominal pain, diarrhea. GU-does not complaining of dysuria   NEUROLOGICAL:  He is not complaining of lateralizing weakness or dizziness-again apparently there is some thought he may have occasional seizure activity Psych-nursing does not report any issues continues to be pleasant and cooperative.  Marland Kitchen      PHYSICAL EXAMINATION:   Is afebrile pulse of 80 respirations 19 blood pressure 130/60 weight is 142 GENERAL APPEARANCE:  The patient is not in any distress sitting comfortably in his wheelchair  His skin is warm and dry Eyes-apparently he is legally blind he does apparently have some vision in his right eye but has significant visual --this appears to be baseline.               CHEST/RESPIRATORY:  Clear air entry bilaterally.    CARDIOVASCULAR:   CARDIAC: .  Heart sounds 2/6 systolic murmur.  Marland Kitchen--Minimal lower extremity edema      GASTROINTESTINAL:   ABDOMEN: Soft nontender with positive  bowel sounds    Musculoskeletal-appears to move his extremities at baseline he ambulates in a wheelchairoften         NEUROLOGICAL:    CRANIAL NERVES:   He does  have left facial weakness.  This is not new.   Otherwise appears to move his extremities at baseline Psych he appears grossly alert and oriented pleasant and appropriate    Labs. As noted above in addition  04/03/2015.  Sodium 141 potassium 4.4 BUN 12 creatinine 0.83.  WBC 10.4 hemoglobin 8.7 platelets 472.  EKG done did not show any changes compared to the EKG done in the ER on Thanksgiving day.    ASSESSMENT/PLAN:  Weight loss-patient apparently does not have very good appetite Remeron has just been started did encourage him about his eating-family has been encourages well to bring in food that he likes with hopes this will help.  Will check lab work tomorrow  Continue to monitor weights clinically he does not appear to be unstable   Anemia- He has been seen by GI-etiology unclear is my understanding and they are following up with an EGD in the near future-per review of GI note they state this could be due to possibly H. pylori-hemorrhoids-AVM-less likely carcinoma Will update lab again tomorrow.    History coronary artery disease-he has been seen recently cardiology he did have a stress test earlier today-it was thought to be fairly stable with his coronary  artery disease continue current medications including labetalol lisinopril -a statin-and Plavix   [id number]

## 2015-05-10 ENCOUNTER — Encounter (HOSPITAL_COMMUNITY): Payer: Self-pay | Admitting: Cardiology

## 2015-05-10 ENCOUNTER — Other Ambulatory Visit (HOSPITAL_COMMUNITY)
Admission: RE | Admit: 2015-05-10 | Discharge: 2015-05-10 | Disposition: A | Discharge status: Home / Self Care | Payer: Medicare Other | Source: Skilled Nursing Facility | Attending: Internal Medicine | Admitting: Internal Medicine

## 2015-05-10 ENCOUNTER — Emergency Department (HOSPITAL_COMMUNITY)
Admission: EM | Admit: 2015-05-10 | Discharge: 2015-05-10 | Disposition: A | Discharge status: Home / Self Care | Payer: Medicare Other | Attending: Emergency Medicine | Admitting: Emergency Medicine

## 2015-05-10 DIAGNOSIS — Z79899 Other long term (current) drug therapy: Secondary | ICD-10-CM | POA: Diagnosis not present

## 2015-05-10 DIAGNOSIS — Z7984 Long term (current) use of oral hypoglycemic drugs: Secondary | ICD-10-CM | POA: Diagnosis not present

## 2015-05-10 DIAGNOSIS — Z8673 Personal history of transient ischemic attack (TIA), and cerebral infarction without residual deficits: Secondary | ICD-10-CM | POA: Insufficient documentation

## 2015-05-10 DIAGNOSIS — K5900 Constipation, unspecified: Secondary | ICD-10-CM | POA: Insufficient documentation

## 2015-05-10 DIAGNOSIS — Z7902 Long term (current) use of antithrombotics/antiplatelets: Secondary | ICD-10-CM | POA: Diagnosis not present

## 2015-05-10 DIAGNOSIS — E785 Hyperlipidemia, unspecified: Secondary | ICD-10-CM | POA: Diagnosis not present

## 2015-05-10 DIAGNOSIS — Z87891 Personal history of nicotine dependence: Secondary | ICD-10-CM | POA: Diagnosis not present

## 2015-05-10 DIAGNOSIS — Z9889 Other specified postprocedural states: Secondary | ICD-10-CM | POA: Diagnosis not present

## 2015-05-10 DIAGNOSIS — E119 Type 2 diabetes mellitus without complications: Secondary | ICD-10-CM | POA: Diagnosis not present

## 2015-05-10 DIAGNOSIS — E860 Dehydration: Secondary | ICD-10-CM | POA: Diagnosis not present

## 2015-05-10 DIAGNOSIS — Z7901 Long term (current) use of anticoagulants: Secondary | ICD-10-CM | POA: Insufficient documentation

## 2015-05-10 DIAGNOSIS — I2510 Atherosclerotic heart disease of native coronary artery without angina pectoris: Secondary | ICD-10-CM | POA: Insufficient documentation

## 2015-05-10 DIAGNOSIS — R7989 Other specified abnormal findings of blood chemistry: Secondary | ICD-10-CM | POA: Diagnosis present

## 2015-05-10 DIAGNOSIS — Z8669 Personal history of other diseases of the nervous system and sense organs: Secondary | ICD-10-CM | POA: Insufficient documentation

## 2015-05-10 DIAGNOSIS — I10 Essential (primary) hypertension: Secondary | ICD-10-CM | POA: Diagnosis not present

## 2015-05-10 DIAGNOSIS — N179 Acute kidney failure, unspecified: Secondary | ICD-10-CM

## 2015-05-10 DIAGNOSIS — J449 Chronic obstructive pulmonary disease, unspecified: Secondary | ICD-10-CM | POA: Diagnosis not present

## 2015-05-10 LAB — CBC WITH DIFFERENTIAL/PLATELET
BASOS ABS: 0.1 10*3/uL (ref 0.0–0.1)
BASOS PCT: 1 %
Basophils Absolute: 0 10*3/uL (ref 0.0–0.1)
Basophils Relative: 1 %
EOS PCT: 6 %
Eosinophils Absolute: 0.4 10*3/uL (ref 0.0–0.7)
Eosinophils Absolute: 0.4 10*3/uL (ref 0.0–0.7)
Eosinophils Relative: 5 %
HCT: 32.7 % — ABNORMAL LOW (ref 39.0–52.0)
HEMATOCRIT: 31 % — AB (ref 39.0–52.0)
HEMOGLOBIN: 10.2 g/dL — AB (ref 13.0–17.0)
Hemoglobin: 10.6 g/dL — ABNORMAL LOW (ref 13.0–17.0)
LYMPHS ABS: 2.3 10*3/uL (ref 0.7–4.0)
LYMPHS PCT: 28 %
Lymphocytes Relative: 26 %
Lymphs Abs: 1.9 10*3/uL (ref 0.7–4.0)
MCH: 28.3 pg (ref 26.0–34.0)
MCH: 28 pg (ref 26.0–34.0)
MCHC: 32.9 g/dL (ref 30.0–36.0)
MCHC: 32.4 g/dL (ref 30.0–36.0)
MCV: 86.1 fL (ref 78.0–100.0)
MCV: 86.5 fL (ref 78.0–100.0)
MONO ABS: 0.4 10*3/uL (ref 0.1–1.0)
MONOS PCT: 5 %
Monocytes Absolute: 0.4 10*3/uL (ref 0.1–1.0)
Monocytes Relative: 5 %
NEUTROS PCT: 61 %
Neutro Abs: 5 10*3/uL (ref 1.7–7.7)
Neutro Abs: 4.5 10*3/uL (ref 1.7–7.7)
Neutrophils Relative %: 62 %
PLATELETS: 318 10*3/uL (ref 150–400)
Platelets: 299 10*3/uL (ref 150–400)
RBC: 3.6 MIL/uL — AB (ref 4.22–5.81)
RBC: 3.78 MIL/uL — AB (ref 4.22–5.81)
RDW: 14.2 % (ref 11.5–15.5)
RDW: 14.1 % (ref 11.5–15.5)
WBC: 8.2 10*3/uL (ref 4.0–10.5)
WBC: 7.3 10*3/uL (ref 4.0–10.5)

## 2015-05-10 LAB — BASIC METABOLIC PANEL
ANION GAP: 8 (ref 5–15)
BUN: 59 mg/dL — AB (ref 6–20)
CALCIUM: 9.9 mg/dL (ref 8.9–10.3)
CO2: 25 mmol/L (ref 22–32)
Chloride: 111 mmol/L (ref 101–111)
Creatinine, Ser: 1.85 mg/dL — ABNORMAL HIGH (ref 0.61–1.24)
GFR calc Af Amer: 39 mL/min — ABNORMAL LOW (ref >60)
GFR, EST NON AFRICAN AMERICAN: 33 mL/min — AB (ref >60)
GLUCOSE: 122 mg/dL — AB (ref 65–99)
POTASSIUM: 5.1 mmol/L (ref 3.5–5.1)
SODIUM: 144 mmol/L (ref 135–145)

## 2015-05-10 LAB — COMPREHENSIVE METABOLIC PANEL
ALBUMIN: 3.9 g/dL (ref 3.5–5.0)
ALK PHOS: 67 U/L (ref 38–126)
ALT: 21 U/L (ref 17–63)
ANION GAP: 10 (ref 5–15)
AST: 20 U/L (ref 15–41)
BILIRUBIN TOTAL: 0.4 mg/dL (ref 0.3–1.2)
BUN: 59 mg/dL — AB (ref 6–20)
CALCIUM: 9.6 mg/dL (ref 8.9–10.3)
CO2: 22 mmol/L (ref 22–32)
Chloride: 111 mmol/L (ref 101–111)
Creatinine, Ser: 1.93 mg/dL — ABNORMAL HIGH (ref 0.61–1.24)
GFR calc Af Amer: 37 mL/min — ABNORMAL LOW (ref >60)
GFR, EST NON AFRICAN AMERICAN: 32 mL/min — AB (ref >60)
GLUCOSE: 91 mg/dL (ref 65–99)
POTASSIUM: 5.2 mmol/L — AB (ref 3.5–5.1)
Sodium: 143 mmol/L (ref 135–145)
TOTAL PROTEIN: 8 g/dL (ref 6.5–8.1)

## 2015-05-10 MED ORDER — SODIUM CHLORIDE 0.9 % IV BOLUS (SEPSIS)
1000.0000 mL | Freq: Once | INTRAVENOUS | Status: AC
  Administered 2015-05-10: 1000 mL via INTRAVENOUS

## 2015-05-10 NOTE — ED Notes (Signed)
Call placed to [facility]- spoke to "[place]" pt's nurse , informed of MD's instructions to have NS '@100ml'$ /hr  For total of 1074ms and to have recheck of BMP in am . Also informed nurse pt is ready to be transported back to the facility

## 2015-05-10 NOTE — ED Provider Notes (Signed)
CSN: [id number]     Arrival date & time 05/10/15  1052 History   First MD Initiated Contact with Patient 05/10/15 1055     Chief Complaint  Patient presents with  . Abnormal Lab     (Consider location/radiation/quality/duration/timing/severity/associated sxs/prior Treatment) HPI Comments: Patient is a 81 year old male with past medical history of coronary artery disease, COPD, diabetes, CVA. He was sent from the [facility] for evaluation of abnormal laboratory studies. His BUN and creatinine are said to be elevated by his laboratory studies performed earlier today. The patient's only complaint is "not feeling good". He denies any chest pain or difficulty breathing. He denies any abdominal pain, vomiting, or diarrhea. He denies any fevers or chills.  The history is provided by the patient.    Past Medical History  Diagnosis Date  . CAD (coronary artery disease)     cath 11/2007: 70% LM ISR, SVG-LAD OK, SVG-D1 100%, IMA-D2 OK, SVG-OM OK, 4-vessel CABG 2000.  Marland Kitchen CAD (coronary artery disease)     s/p of 95% L main artery stenosis, 4/03: cutting balloon PTCA of 80% in-stent restenosis, 7/04. preserved L ventriculr function  . Carotid bruit     bilateral. no sigificant distal abd atherosclerosis by recent cath.   Marland Kitchen COPD (chronic obstructive pulmonary disease) (HCC)     ongoing tobacco   . DM2 (diabetes mellitus, type 2) ([place])   . Dyslipidemia   . HTN (hypertension)   . Glaucoma   . CVA (cerebral infarction)   . Dysphagia   . Constipation    Past Surgical History  Procedure Laterality Date  . Cardiac catheterization     Family History  Problem Relation Age of Onset  . Hypertension Mother    Social History  Substance Use Topics  . Smoking status: Former Smoker    Quit date: 08/03/2014  . Smokeless tobacco: None     Comment: 50 pack year hx   . Alcohol Use: No     Comment: has not had alcohol in 30-40 years     Review of Systems  All other systems reviewed and are  negative.     Allergies  Review of patient's allergies indicates no known allergies.  Home Medications   Prior to Admission medications   Medication Sig Start Date End Date Taking? Authorizing Provider  ALPRAZolam Duanne Moron) 0.25 MG tablet Take one tablet by mouth three times daily for anxiety Patient not taking: Reported on 04/05/2015 03/31/15   Tiffany L Reed, DO  bisacodyl (DULCOLAX) 10 MG suppository Place 1 suppository (10 mg total) rectally daily as needed (no BM in 48 hours). 03/10/15   Janece Canterbury, MD  bisacodyl (DULCOLAX) 5 MG EC tablet Take 10 mg by mouth daily as needed for moderate constipation.    Historical Provider, MD  clopidogrel (PLAVIX) 75 MG tablet Take 75 mg by mouth daily.    Historical Provider, MD  feeding supplement, ENSURE ENLIVE, (ENSURE ENLIVE) LIQD Take 237 mLs by mouth at bedtime.    Historical Provider, MD  ferrous sulfate 325 (65 FE) MG tablet Take 325 mg by mouth daily with breakfast.    Historical Provider, MD  furosemide (LASIX) 20 MG tablet Take 20 mg by mouth daily. Reported on 04/18/2015    Historical Provider, MD  isosorbide-hydrALAZINE (BIDIL) 20-37.5 MG tablet Take 1 tablet by mouth 3 (three) times daily. Patient taking differently: Take 0.5 tablets by mouth 2 (two) times daily.  03/10/15   Janece Canterbury, MD  labetalol (NORMODYNE) 200 MG tablet Take  1 tablet (200 mg total) by mouth 3 (three) times daily. 03/10/15   Janece Canterbury, MD  lisinopril (PRINIVIL,ZESTRIL) 20 MG tablet Take 20 mg by mouth daily.      Historical Provider, MD  Maltodextrin-Xanthan Gum ([place]) POWD Per instructions to create nectar thick liquids 03/10/15   Janece Canterbury, MD  metFORMIN (GLUCOPHAGE) 500 MG tablet Take 500 mg by mouth 2 (two) times daily with a meal.    Historical Provider, MD  Na Sulfate-K Sulfate-Mg Sulf (SUPREP BOWEL PREP) SOLN Take 1 kit by mouth as directed. 04/23/15   Danie Binder, MD  Omega-3 Fatty Acids (FISH OIL) 1000 MG CAPS Take  1,000 mg by mouth 3 (three) times daily.    Historical Provider, MD  omeprazole (PRILOSEC OTC) 20 MG tablet Take 20 mg by mouth at bedtime.    Historical Provider, MD  polyethylene glycol powder (MIRALAX) powder Take 17 g by mouth 2 (two) times daily. 03/10/15   Janece Canterbury, MD  Potassium Chloride ER 20 MEQ TBCR Take 1 tablet by mouth daily.    Historical Provider, MD  pravastatin (PRAVACHOL) 80 MG tablet Take 1 tablet (80 mg total) by mouth daily at 6 PM. 03/10/15   Janece Canterbury, MD  senna (SENOKOT) 8.6 MG TABS tablet Take 2 tablets (17.2 mg total) by mouth daily. 03/10/15   Janece Canterbury, MD  tamsulosin (FLOMAX) 0.4 MG CAPS capsule Take 0.4 mg by mouth daily after supper.    Historical Provider, MD   BP 143/60 mmHg  Pulse 60  Temp(Src) 98.3 F (36.8 C) (Oral)  Resp 18  Ht '5\' 9"'$  (1.753 m)  Wt 144 lb (65.318 kg)  BMI 21.26 kg/m2  SpO2 99% Physical Exam  Constitutional: He is oriented to person, place, and time.  Patient is a 81 year old male who appears chronically ill.  HENT:  Head: Normocephalic and atraumatic.  Eyes: Pupils are equal, round, and reactive to light.  Neck: Normal range of motion. Neck supple.  Cardiovascular: Normal rate, regular rhythm and normal heart sounds.   No murmur heard. Pulmonary/Chest: Effort normal and breath sounds normal. No respiratory distress. He has no wheezes.  Abdominal: Soft. Bowel sounds are normal. He exhibits no distension. There is no tenderness.  Musculoskeletal: Normal range of motion. He exhibits no edema.  Lymphadenopathy:    He has no cervical adenopathy.  Neurological: He is alert and oriented to person, place, and time.  Skin: Skin is warm and dry.  Nursing note and vitals reviewed.   ED Course  Procedures (including critical care time) Labs Review Labs Reviewed  BASIC METABOLIC PANEL  CBC WITH DIFFERENTIAL/PLATELET    Imaging Review US Carotid Duplex Bilateral  05/09/2015  CLINICAL DATA:  Coronary artery disease.  EXAM: BILATERAL CAROTID DUPLEX ULTRASOUND TECHNIQUE: Pearline Cables scale imaging, color Doppler and duplex ultrasound were performed of bilateral carotid and vertebral arteries in the neck. COMPARISON:  MRI 11/24/ 2016.  MRI 03/05/2015. FINDINGS: Criteria: Quantification of carotid stenosis is based on velocity parameters that correlate the residual internal carotid diameter with NASCET-based stenosis levels, using the diameter of the distal internal carotid lumen as the denominator for stenosis measurement. The following velocity measurements were obtained: RIGHT ICA:  67/16 cm/sec CCA:  0 cm/sec ECA:  49 cm/sec LEFT ICA:  208/39 cm/sec CCA:  258/52 cm/sec SYSTOLIC ICA/CCA RATIO:  1.6 DIASTOLIC ICA/CCA RATIO:  1.5 ECA:  293 cm/sec RIGHT CAROTID ARTERY: Complete occlusion of the right common carotid artery again noted. RIGHT VERTEBRAL ARTERY:  Patent  with retrograde flow. LEFT CAROTID ARTERY: Moderate plaque left carotid bifurcation. Degree of stenosis less than 50%. LEFT VERTEBRAL ARTERY:  No flow demonstrated. IMPRESSION: 1. Complete occlusion of the right common carotid artery again noted. 2. Moderate left carotid bifurcation atherosclerotic vascular disease with degree of stenosis less than 50%. 3. Retrograde flow in the right vertebral artery. No flow noted in the left vertebral artery. Electronically Signed   By: Marcello Moores  Register   On: 05/09/2015 13:36   Nm Myocar Multi W/spect W/wall Motion / Ef  05/09/2015   T wave inversion was noted during stress.  Defect 1: There is a small defect of mild severity present in the basal inferior location.  This is a low risk study.  The left ventricular ejection fraction is mildly decreased (45-54%).    I have personally reviewed and evaluated these images and lab results as part of my medical decision-making.   EKG Interpretation None      MDM   Final diagnoses:  None    Repeat laboratory studies reveal elevated BUN and creatinine. Patient does appear somewhat  dehydrated. He was given 1 L of normal saline here and will be discharged back to the [facility] with instructions to continue IV fluids there.    Veryl Speak, MD 05/10/15 1226

## 2015-05-10 NOTE — Discharge Instructions (Signed)
Continue normal saline at 100 mL per hour for the next 10 hours.  Repeat basic metabolic panel tomorrow.   Dehydration, Adult Dehydration is a condition in which you do not have enough fluid or water in your body. It happens when you take in less fluid than you lose. Vital organs such as the kidneys, brain, and heart cannot function without a proper amount of fluids. Any loss of fluids from the body can cause dehydration.  Dehydration can range from mild to severe. This condition should be treated right away to help prevent it from becoming severe. CAUSES  This condition may be caused by:  Vomiting.  Diarrhea.  Excessive sweating, such as when exercising in hot or humid weather.  Not drinking enough fluid during strenuous exercise or during an illness.  Excessive urine output.  Fever.  Certain medicines. RISK FACTORS This condition is more likely to develop in:  People who are taking certain medicines that cause the body to lose excess fluid (diuretics).   People who have a chronic illness, such as diabetes, that may increase urination.  Older adults.   People who live at high altitudes.   People who participate in endurance sports.  SYMPTOMS  Mild Dehydration  Thirst.  Dry lips.  Slightly dry mouth.  Dry, warm skin. Moderate Dehydration  Very dry mouth.   Muscle cramps.   Dark urine and decreased urine production.   Decreased tear production.   Headache.   Light-headedness, especially when you stand up from a sitting position.  Severe Dehydration  Changes in skin.   Cold and clammy skin.   Skin does not spring back quickly when lightly pinched and released.   Changes in body fluids.   Extreme thirst.   No tears.   Not able to sweat when body temperature is high, such as in hot weather.   Minimal urine production.   Changes in vital signs.   Rapid, weak pulse (more than 100 beats per minute when you are sitting still).    Rapid breathing.   Low blood pressure.   Other changes.   Sunken eyes.   Cold hands and feet.   Confusion.  Lethargy and difficulty being awakened.  Fainting (syncope).   Short-term weight loss.   Unconsciousness. DIAGNOSIS  This condition may be diagnosed based on your symptoms. You may also have tests to determine how severe your dehydration is. These tests may include:   Urine tests.   Blood tests.  TREATMENT  Treatment for this condition depends on the severity. Mild or moderate dehydration can often be treated at home. Treatment should be started right away. Do not wait until dehydration becomes severe. Severe dehydration needs to be treated at the hospital. Treatment for Mild Dehydration  Drinking plenty of water to replace the fluid you have lost.   Replacing minerals in your blood (electrolytes) that you may have lost.  Treatment for Moderate Dehydration  Consuming oral rehydration solution (ORS). Treatment for Severe Dehydration  Receiving fluid through an IV tube.   Receiving electrolyte solution through a feeding tube that is passed through your nose and into your stomach (nasogastric tube or NG tube).  Correcting any abnormalities in electrolytes. HOME CARE INSTRUCTIONS   Drink enough fluid to keep your urine clear or pale yellow.   Drink water or fluid slowly by taking small sips. You can also try sucking on ice cubes.  Have food or beverages that contain electrolytes. Examples include bananas and sports drinks.  Take over-the-counter and  prescription medicines only as told by your health care provider.   Prepare ORS according to the manufacturer's instructions. Take sips of ORS every 5 minutes until your urine returns to normal.  If you have vomiting or diarrhea, continue to try to drink water, ORS, or both.   If you have diarrhea, avoid:   Beverages that contain caffeine.   Fruit juice.   Milk.   Carbonated soft  drinks.  Do not take salt tablets. This can lead to the condition of having too much sodium in your body (hypernatremia).  SEEK MEDICAL CARE IF:  You cannot eat or drink without vomiting.  You have had moderate diarrhea during a period of more than 24 hours.  You have a fever. SEEK IMMEDIATE MEDICAL CARE IF:   You have extreme thirst.  You have severe diarrhea.  You have not urinated in 6-8 hours, or you have urinated only a small amount of very dark urine.  You have shriveled skin.  You are dizzy, confused, or both.   This information is not intended to replace advice given to you by your health care provider. Make sure you discuss any questions you have with your health care provider.   Document Released: 04/19/2005 Document Revised: 01/08/2015 Document Reviewed: 09/04/2014 Elsevier Interactive Patient Education [employer].

## 2015-05-10 NOTE — ED Notes (Signed)
Here from [facility] for elevated BUN and Creatnine.  Pt states he doesn't feel right in his head or neck.

## 2015-05-10 NOTE — ED Notes (Signed)
Discharge instructions given to caregiver . Also placed pt's yellow DNR form into envelope with DC instructions and written order . Pt wheeled out by Care taker from [facility]

## 2015-05-11 ENCOUNTER — Encounter: Payer: Self-pay | Admitting: Internal Medicine

## 2015-05-11 ENCOUNTER — Encounter (HOSPITAL_COMMUNITY)
Admission: AD | Admit: 2015-05-11 | Discharge: 2015-05-11 | Disposition: A | Discharge status: Home / Self Care | Payer: Medicare Other | Source: Skilled Nursing Facility | Attending: Internal Medicine | Admitting: Internal Medicine

## 2015-05-11 DIAGNOSIS — E785 Hyperlipidemia, unspecified: Secondary | ICD-10-CM | POA: Insufficient documentation

## 2015-05-11 DIAGNOSIS — D500 Iron deficiency anemia secondary to blood loss (chronic): Secondary | ICD-10-CM | POA: Insufficient documentation

## 2015-05-11 DIAGNOSIS — I10 Essential (primary) hypertension: Secondary | ICD-10-CM | POA: Diagnosis not present

## 2015-05-11 DIAGNOSIS — R634 Abnormal weight loss: Secondary | ICD-10-CM | POA: Insufficient documentation

## 2015-05-11 LAB — BASIC METABOLIC PANEL
Anion gap: 8 (ref 5–15)
BUN: 39 mg/dL — ABNORMAL HIGH (ref 6–20)
CALCIUM: 9.3 mg/dL (ref 8.9–10.3)
CO2: 22 mmol/L (ref 22–32)
CREATININE: 1.11 mg/dL (ref 0.61–1.24)
Chloride: 115 mmol/L — ABNORMAL HIGH (ref 101–111)
GFR calc Af Amer: >60 mL/min (ref >60)
GFR calc non Af Amer: >60 mL/min (ref >60)
Glucose, Bld: 88 mg/dL (ref 65–99)
Potassium: 4.9 mmol/L (ref 3.5–5.1)
SODIUM: 145 mmol/L (ref 135–145)

## 2015-05-12 ENCOUNTER — Encounter (HOSPITAL_COMMUNITY)
Admission: RE | Admit: 2015-05-12 | Discharge: 2015-05-12 | Disposition: A | Discharge status: Home / Self Care | Payer: Medicare Other | Source: Skilled Nursing Facility | Attending: Internal Medicine | Admitting: Internal Medicine

## 2015-05-12 DIAGNOSIS — E785 Hyperlipidemia, unspecified: Secondary | ICD-10-CM | POA: Diagnosis not present

## 2015-05-12 DIAGNOSIS — D500 Iron deficiency anemia secondary to blood loss (chronic): Secondary | ICD-10-CM | POA: Diagnosis not present

## 2015-05-12 DIAGNOSIS — I10 Essential (primary) hypertension: Secondary | ICD-10-CM | POA: Diagnosis not present

## 2015-05-12 LAB — BASIC METABOLIC PANEL
Anion gap: 7 (ref 5–15)
BUN: 31 mg/dL — ABNORMAL HIGH (ref 6–20)
CALCIUM: 9.2 mg/dL (ref 8.9–10.3)
CO2: 24 mmol/L (ref 22–32)
Chloride: 113 mmol/L — ABNORMAL HIGH (ref 101–111)
Creatinine, Ser: 1.09 mg/dL (ref 0.61–1.24)
GFR calc non Af Amer: >60 mL/min (ref >60)
Glucose, Bld: 87 mg/dL (ref 65–99)
Potassium: 4.7 mmol/L (ref 3.5–5.1)
SODIUM: 144 mmol/L (ref 135–145)

## 2015-05-13 ENCOUNTER — Non-Acute Institutional Stay (SKILLED_NURSING_FACILITY): Payer: Medicare Other | Admitting: Internal Medicine

## 2015-05-13 DIAGNOSIS — D649 Anemia, unspecified: Secondary | ICD-10-CM

## 2015-05-13 DIAGNOSIS — R634 Abnormal weight loss: Secondary | ICD-10-CM

## 2015-05-13 DIAGNOSIS — N179 Acute kidney failure, unspecified: Secondary | ICD-10-CM

## 2015-05-13 NOTE — Progress Notes (Signed)
Patient ID: [name], male   DOB: [birth year], 81 y.o.   MRN: [id number]                             FACILITY: [facility]                    LEVEL OF CARE:   SNF   Acute Visit                    CHIEF COMPLAINT: Acute visit follow-up renal insufficiency  F     HISTORY OF PRESENT ILLNESS:  [name] is a patien trecently admitted to the building.    He was felt to have abdominal pain secondary to constipation and ischemic colitis.  Discharged here on a seven-day course of ciprofloxacin and Flagyl.  From this regard, everything has been stable.   He has not been complaining of pain.    He also was felt to have a hypertensive urgency when he arrived in the hospital with a blood pressure of 206/77-this is stabilized however there is a feeling he may have orthostatic hypotension       Recently, the nurses here noted a left facial droop and sent him to the ER.  A CT scan of the head was negative.       The patient had a previous MRI of the brain on November 2nd.  This showed no acute or subacute infarction.  However, he does have stable remote infarcts involving the right cerebellum, left greater than right basal ganglia, brainstem, and right greater than left corona radiata.  Matters continue to be somewhat complicated here --it was noted his hemoglobin had dropped down to 9.2 he did have an occult positive stool test-we did start him on a proton pump inhibitor he continues on iron at that point was decided keep him on Plavix and aspirin with his history of CVA.and CAD  Hemoglobin on December 5 was 8.1 which has been relatively stable lately-he has been seen by GI and they are apparently planning an EGD at some point Most recent hemoglobin on July 7 showed improvement at 10.2   Patient has had episodes of unresponsiveness however he quickly returned to baseline when  put to bed and his legs are elevated-he has been seen by cardiology     There is some  suspicion he does have a history of seizures     It has been noted the patient has a somewhat poor appetite appears to be losing weight weight  recently 142-it appears he was 148.6 back on December 12-this appears to have stabilized somewhat with current weight 143.4.   He has been started on Remeron  Lab done on January 7 was concerning for a creatinine of 1.93 which is significantly above his baseline which appears to be around 1-she did go to the ER and received IV fluids and has come back creatinine appears to have normalized at 1.09 BUN of 31 on lab done yesterday this will have to be updated.  According nursing staff he appears to be improving    .           Past Medical History  Diagnosis Date  . CAD (coronary artery disease)     multi vessel. s/p recent bare metal stenting of high grade proximal R CA stenosis. residual 70% : main in-stent restenosis, with distal filling of the left anterior descending from graft  flow. patent bypass graft, except for 100% occlusion of saphernous vein graft - 1st diagonal branch. 3-vessel CABG 2000.  Marland Kitchen CAD (coronary artery disease)     s/p of 95% L main artery stenosis, 4/03: cutting balloon PTCA of 80% in-stent restenosis, 7/04. preserved L ventriculr function  . Carotid bruit     bilateral. no sigificant distal abd atherosclerosis by recent cath.   Marland Kitchen COPD (chronic obstructive pulmonary disease) (HCC)     ongoing tobacco   . DM2 (diabetes mellitus, type 2) ([place])   . Dyslipidemia   . HTN (hypertension)   . Glaucoma   . CVA (cerebral infarction)   . Dysphagia   . Constipation       Past Surgical History  Procedure Laterality Date  . Cardiac catheterization      Family medical social history reviewed per history and physical on 03/12/2015  Current Outpatient Prescriptions on File Prior to Visit  Medication Sig Dispense Refill  . ALPRAZolam (XANAX) 0.25 MG tablet Take one tablet by mouth three times daily for anxiety (Patient  taking differently: Take 0.25 mg by mouth 3 (three) times daily. for anxiety) 90 tablet 5  . amLODipine (NORVASC) 5 MG tablet Take 5 mg by mouth daily.    Marland Kitchen aspirin 325 MG tablet Take 325 mg by mouth daily.      . bisacodyl (DULCOLAX) 10 MG suppository Place 1 suppository (10 mg total) rectally daily as needed (no BM in 48 hours). 12 suppository 0  . Calcium Carbonate-Vitamin D (OS-CAL 500 + D PO) Take 1 tablet by mouth daily as needed.    . ciprofloxacin (CIPRO) 500 MG tablet Take 1 tablet (500 mg total) by mouth 2 (two) times daily. 4 tablet 0  . clopidogrel (PLAVIX) 75 MG tablet Take 75 mg by mouth daily.    Marland Kitchen docusate sodium (COLACE) 100 MG capsule Take 100 mg by mouth 2 (two) times daily.    . ferrous sulfate 325 (65 FE) MG tablet Take 325 mg by mouth daily with breakfast.    . furosemide (LASIX) 20 MG tablet Take 20 mg by mouth daily.    . isosorbide-hydrALAZINE (BIDIL) 20-37.5 MG tablet Take 1 tablet by mouth 3 (three) times daily. 90 tablet 0  . labetalol (NORMODYNE) 200 MG tablet Take 1 tablet (200 mg total) by mouth 3 (three) times daily. 90 tablet 0  . lisinopril (PRINIVIL,ZESTRIL) 20 MG tablet Take 20 mg by mouth daily.      . Maltodextrin-Xanthan Gum (RESOURCE THICKENUP CLEAR) POWD Per instructions to create nectar thick liquids (Patient not taking: Reported on 03/15/2015) 5 Can 0  . metFORMIN (GLUCOPHAGE) 500 MG tablet Take 500 mg by mouth 2 (two) times daily with a meal.    .    6 tablet 0  . Multiple Vitamins-Minerals (MULTIVITAMIN PO) Take 1 tablet by mouth daily as needed.    . nicotine (NICODERM CQ - DOSED IN MG/24 HOURS) 14 mg/24hr patch Place 1 patch (14 mg total) onto the skin daily. 28 patch 0         . Omega-3 Fatty Acids (FISH OIL) 1000 MG CAPS Take 1,000 mg by mouth 3 (three) times daily.    . polyethylene glycol powder (MIRALAX) powder Take 17 g by mouth 2 (two) times daily. 850 g 0  .       . pravastatin (PRAVACHOL) 80 MG tablet Take 1 tablet (80 mg total) by  mouth daily at 6 PM. 30 tablet 0  . senna (SENOKOT) 8.6  MG TABS tablet Take 2 tablets (17.2 mg total) by mouth daily. 120 each 0  . tamsulosin (FLOMAX) 0.4 MG CAPS capsule Take 0.4 mg by mouth daily after supper.       LABORATORY DATA  May 12 2015.  Sodium 144 potassium 4.7 BUN 31 creatinine 1.09.  May 10 2015.  Sodium 143 potassium 5.2 BUN 59 creatinine 1.93.  WBC 8.2 hemoglobin 10.2 platelets 299  04/07/2015.  Sodium 143 potassium 3.7 BUN 24 creatinine 0.9.  WBC 13.8 hemoglobin 8.1 platelets 444.  :  04/02/2015.  Sodium 139 potassium 4.1 BUN 15 creatinine 0.77.  03/31/2015.  WBC 11.8 hemoglobin 8.1 platelets 362.  Urine culture 03/30/2015-multiple species non-predominant       03/26/2015.  WBC 9.4 hemoglobin 9.2 platelets 367.  Sodium 137 potassium 4.5 BUN 23 creatinine 0.88.  November 18-----  hemoglobin was 11.4.  :     Sodium 138, BUN 13, creatinine 0.99.    White count 9, hemoglobin 11.4.    REVIEW OF SYSTEMS:    GENERAL:  The patient does not have any specific complaints--nursing feels he is a bit more energetic looking better Skin does not complain of rashes or itching.   HEENT:   No headache. Appears to be legally blind does have some sight in his right eye apparently  CHEST/RESPIRATORY:  No shortness of breath.   CARDIAC:  No chest pain.   GI:    no abdominal pain, diarrhea. GU-does not complaining of dysuria   NEUROLOGICAL:  He is not complaining of lateralizing weakness or dizziness-again apparently there is some thought he may have occasional seizure activity Psych-nursing does not report any issues continues to be pleasant and cooperative.  Marland Kitchen      PHYSICAL EXAMINATION:   Mental 98.0 pulse 77 respirations 18 blood pressure 98/58 GENERAL APPEARANCE:  The patient is not in any distress sitting comfortably in his wheelchair--he does appear generally a bit stronger than I have seen in the past  His skin is warm and dry Eyes-apparently he  is legally blind he does apparently have some vision in his right eye but has significant visual --this appears to be baseline. Oropharynx clear mucous membranes moist               CHEST/RESPIRATORY:  Clear air entry bilaterally.    CARDIOVASCULAR:   CARDIAC: .  Heart sounds 2/6 systolic murmur.  Marland Kitchen--Minimal lower extremity edema      GASTROINTESTINAL:   ABDOMEN: Soft nontender with positive bowel sounds    Musculoskeletal-appears to move his extremities at baseline he ambulates in a wheelchairoften         NEUROLOGICAL:    CRANIAL NERVES:   He does  have left facial weakness.  This is not new.   Otherwise appears to move his extremities at baseline Psych he appears grossly alert and oriented pleasant and appropriate         ASSESSMENT/PLAN:  Renal insufficiency-I do note he is on Lasix he does not really have evidence of increased edema will DC the Lasix secondary renal function and monitor labs-will update a metabolic panel on Thursday, January 12 most recent creatinine 1.09 a significant improvement from 1.93 back on January 7--apparently he is eating and drinking somewhat better this will have to be encouraged  Weight loss-this appears to be somewhat stabilized as noted above he is on Remeron--appears e to be a bit stronger -- doing better    Anemia- He has been seen by GI-etiology unclear is my  understanding and they are following up with an EGD in the near future-per review of GI note they state this could be due to possibly H. pylori-hemorrhoids-AVM-less likely carcinoma--her hemoglobin shows improvement at 10.2 on lab done 05/10/2015   [id number] .       [id number]

## 2015-05-14 ENCOUNTER — Encounter (HOSPITAL_COMMUNITY)
Admission: RE | Disposition: A | Discharge status: Skilled Nursing Facility | Payer: Self-pay | Source: Ambulatory Visit | Attending: Gastroenterology

## 2015-05-14 ENCOUNTER — Encounter (HOSPITAL_COMMUNITY)
Admission: RE | Admit: 2015-05-14 | Discharge: 2015-05-14 | Disposition: A | Discharge status: Home / Self Care | Payer: Medicare Other | Source: Skilled Nursing Facility | Attending: Internal Medicine | Admitting: Internal Medicine

## 2015-05-14 ENCOUNTER — Encounter (HOSPITAL_COMMUNITY): Payer: Self-pay | Admitting: *Deleted

## 2015-05-14 ENCOUNTER — Telehealth: Payer: Self-pay | Admitting: Gastroenterology

## 2015-05-14 ENCOUNTER — Ambulatory Visit (HOSPITAL_COMMUNITY)
Admission: RE | Admit: 2015-05-14 | Discharge: 2015-05-14 | Disposition: A | Discharge status: Skilled Nursing Facility | Payer: Medicare Other | Source: Ambulatory Visit | Attending: Gastroenterology | Admitting: Gastroenterology

## 2015-05-14 ENCOUNTER — Inpatient Hospital Stay
Admission: RE | Admit: 2015-05-14 | Discharge: 2015-06-02 | Disposition: A | Discharge status: Nursing Home (Includes ICF) | Payer: Medicare Other | Source: Ambulatory Visit | Attending: Internal Medicine | Admitting: Internal Medicine

## 2015-05-14 DIAGNOSIS — K2950 Unspecified chronic gastritis without bleeding: Secondary | ICD-10-CM | POA: Insufficient documentation

## 2015-05-14 DIAGNOSIS — D649 Anemia, unspecified: Secondary | ICD-10-CM | POA: Insufficient documentation

## 2015-05-14 DIAGNOSIS — Q2733 Arteriovenous malformation of digestive system vessel: Secondary | ICD-10-CM | POA: Insufficient documentation

## 2015-05-14 DIAGNOSIS — Z7984 Long term (current) use of oral hypoglycemic drugs: Secondary | ICD-10-CM | POA: Insufficient documentation

## 2015-05-14 DIAGNOSIS — D6489 Other specified anemias: Secondary | ICD-10-CM | POA: Insufficient documentation

## 2015-05-14 DIAGNOSIS — D500 Iron deficiency anemia secondary to blood loss (chronic): Secondary | ICD-10-CM | POA: Diagnosis not present

## 2015-05-14 DIAGNOSIS — E119 Type 2 diabetes mellitus without complications: Secondary | ICD-10-CM | POA: Diagnosis not present

## 2015-05-14 DIAGNOSIS — Z79899 Other long term (current) drug therapy: Secondary | ICD-10-CM | POA: Insufficient documentation

## 2015-05-14 DIAGNOSIS — R195 Other fecal abnormalities: Secondary | ICD-10-CM | POA: Insufficient documentation

## 2015-05-14 DIAGNOSIS — K648 Other hemorrhoids: Secondary | ICD-10-CM | POA: Insufficient documentation

## 2015-05-14 DIAGNOSIS — E785 Hyperlipidemia, unspecified: Secondary | ICD-10-CM | POA: Diagnosis not present

## 2015-05-14 DIAGNOSIS — I10 Essential (primary) hypertension: Secondary | ICD-10-CM | POA: Diagnosis not present

## 2015-05-14 DIAGNOSIS — Z7902 Long term (current) use of antithrombotics/antiplatelets: Secondary | ICD-10-CM | POA: Diagnosis not present

## 2015-05-14 DIAGNOSIS — I2510 Atherosclerotic heart disease of native coronary artery without angina pectoris: Secondary | ICD-10-CM | POA: Diagnosis not present

## 2015-05-14 DIAGNOSIS — J449 Chronic obstructive pulmonary disease, unspecified: Secondary | ICD-10-CM | POA: Insufficient documentation

## 2015-05-14 HISTORY — PX: ESOPHAGOGASTRODUODENOSCOPY: SHX5428

## 2015-05-14 HISTORY — PX: COLONOSCOPY: SHX5424

## 2015-05-14 LAB — BASIC METABOLIC PANEL
ANION GAP: 9 (ref 5–15)
BUN: 29 mg/dL — ABNORMAL HIGH (ref 6–20)
CALCIUM: 9.1 mg/dL (ref 8.9–10.3)
CHLORIDE: 112 mmol/L — AB (ref 101–111)
CO2: 24 mmol/L (ref 22–32)
CREATININE: 1.07 mg/dL (ref 0.61–1.24)
GFR calc non Af Amer: >60 mL/min (ref >60)
Glucose, Bld: 83 mg/dL (ref 65–99)
Potassium: 3.9 mmol/L (ref 3.5–5.1)
SODIUM: 145 mmol/L (ref 135–145)

## 2015-05-14 LAB — CBC
HCT: 26.2 % — ABNORMAL LOW (ref 39.0–52.0)
HEMOGLOBIN: 8.7 g/dL — AB (ref 13.0–17.0)
MCH: 28.3 pg (ref 26.0–34.0)
MCHC: 33.2 g/dL (ref 30.0–36.0)
MCV: 85.3 fL (ref 78.0–100.0)
Platelets: 269 10*3/uL (ref 150–400)
RBC: 3.07 MIL/uL — AB (ref 4.22–5.81)
RDW: 14 % (ref 11.5–15.5)
WBC: 6.5 10*3/uL (ref 4.0–10.5)

## 2015-05-14 LAB — FERRITIN: FERRITIN: 24 ng/mL (ref 24–336)

## 2015-05-14 SURGERY — COLONOSCOPY
Anesthesia: Moderate Sedation

## 2015-05-14 MED ORDER — LIDOCAINE VISCOUS 2 % MT SOLN
OROMUCOSAL | Status: DC | PRN
  Administered 2015-05-14: 4 mL via OROMUCOSAL

## 2015-05-14 MED ORDER — SODIUM CHLORIDE 0.9 % IV SOLN
INTRAVENOUS | Status: DC
  Administered 2015-05-14: 1000 mL via INTRAVENOUS

## 2015-05-14 MED ORDER — MEPERIDINE HCL 100 MG/ML IJ SOLN
INTRAMUSCULAR | Status: DC | PRN
  Administered 2015-05-14 (×2): 25 mg via INTRAVENOUS

## 2015-05-14 MED ORDER — STERILE WATER FOR IRRIGATION IR SOLN
Status: DC | PRN
  Administered 2015-05-14: 11:00:00

## 2015-05-14 MED ORDER — MEPERIDINE HCL 100 MG/ML IJ SOLN
INTRAMUSCULAR | Status: AC
  Filled 2015-05-14: qty 2

## 2015-05-14 MED ORDER — MIDAZOLAM HCL 5 MG/5ML IJ SOLN
INTRAMUSCULAR | Status: DC | PRN
  Administered 2015-05-14: 2 mg via INTRAVENOUS
  Administered 2015-05-14: 1 mg via INTRAVENOUS

## 2015-05-14 MED ORDER — MIDAZOLAM HCL 5 MG/5ML IJ SOLN
INTRAMUSCULAR | Status: AC
  Filled 2015-05-14: qty 10

## 2015-05-14 MED ORDER — LIDOCAINE VISCOUS 2 % MT SOLN
OROMUCOSAL | Status: AC
  Filled 2015-05-14: qty 15

## 2015-05-14 MED ORDER — FERROUS SULFATE 325 (65 FE) MG PO TABS: 325.0000 mg | ORAL_TABLET | Freq: Three times a day (TID) | ORAL | Status: DC

## 2015-05-14 NOTE — Discharge Instructions (Signed)
Your prep was not good. YOU NEED TO COME BACK. WE WILL SCHEDULE YOUR COLONOSCOPY WITHIN THE NEXT 2-3 WEEKS. HOLD IRON 7 DAYS PRIOR TO YOUR PROCEDURE.YOU HAVE A SMALL HIATAL HERNIA & GASTRITIS AND AVMS IN YOUR STOMACH AND SMALL BOWEL.   INCREASE ferrous sulfate 325 mg THREE TIMES A DAY.   FOLLOW PREVIOUS DIET. AVOID ITEMS THAT CAUSE BLOATING. SEE INFO BELOW.  YOUR BIOPSY RESULTS WILL BE AVAILABLE IN MY CHART JAN 16 AND MY OFFICE WILL CONTACT YOU IN 10-14 DAYS WITH YOUR RESULTS.   FULL LIQUIDS JAN 28. CLEAR LIQUIDS JAN 29. Next colonoscopy MON JAN 30.  CONTINUE GLUCOPHAGE ON DAY OF COLONOSCOPY.  Follow up in 4 mos.     ENDOSCOPY Care After Read the instructions outlined below and refer to this sheet in the next week. These discharge instructions provide you with general information on caring for yourself after you leave the hospital. While your treatment has been planned according to the most current medical practices available, unavoidable complications occasionally occur. If you have any problems or questions after discharge, call DR. Elvena Oyer, [phone].  ACTIVITY  You may resume your regular activity, but move at a slower pace for the next 24 hours.   Take frequent rest periods for the next 24 hours.   Walking will help get rid of the air and reduce the bloated feeling in your belly (abdomen).   No driving for 24 hours (because of the medicine (anesthesia) used during the test).   You may shower.   Do not sign any important legal documents or operate any machinery for 24 hours (because of the anesthesia used during the test).    NUTRITION  Drink plenty of fluids.   You may resume your normal diet as instructed by your doctor.   Begin with a light meal and progress to your normal diet. Heavy or fried foods are harder to digest and may make you feel sick to your stomach (nauseated).   Avoid alcoholic beverages for 24 hours or as instructed.    MEDICATIONS  You may  resume your normal medications.   WHAT YOU CAN EXPECT TODAY  Some feelings of bloating in the abdomen.   Passage of more gas than usual.   Spotting of blood in your stool or on the toilet paper  .  IF YOU HAD POLYPS REMOVED DURING THE ENDOSCOPY:  Eat a soft diet IF YOU HAVE NAUSEA, BLOATING, ABDOMINAL PAIN, OR VOMITING.    FINDING OUT THE RESULTS OF YOUR TEST Not all test results are available during your visit. DR. Oneida Alar WILL CALL YOU WITHIN 7 DAYS OF YOUR PROCEDUE WITH YOUR RESULTS. Do not assume everything is normal if you have not heard from DR. Cher Egnor IN ONE WEEK, CALL HER OFFICE AT [phone].  SEEK IMMEDIATE MEDICAL ATTENTION AND CALL THE OFFICE: [phone] IF:  You have more than a spotting of blood in your stool.   Your belly is swollen (abdominal distention).   You are nauseated or vomiting.   You have a temperature over 101F.   You have abdominal pain or discomfort that is severe or gets worse throughout the day.  Arteriovenous Malformation An arteriovenous malformation (AVM) is a disorder that has been present since birth (congenital). It is characterized by a complex, tangled web of arteries and veins. An AVM may occur in the STOMACH, COLON, OR SMALL BOWEL.  SYMPTOMS  The most common problems (symptoms) of AVM include:  Bleeding (hemorrhaging).   ANEMIA  TREATMENT  There are  three general forms of treatment for AVM: **ABLATION WITH HEAT

## 2015-05-14 NOTE — Telephone Encounter (Signed)
Nurse from Short Stay called to let us know that patient had a poor prep and per SF patient is to be rescheduled for 06/02/2015, hold iron on 1/22, Full liquid diet on 1/28. Clear liquid diet on 1/29 and to continue glucophage. Patient is a resident at the [facility]. SF said if 1/30 doesn't work to let her know.

## 2015-05-14 NOTE — Op Note (Signed)
[facility] [address] [place], [address]   ENDOSCOPY PROCEDURE REPORT  PATIENT: [name], [name]  MR#: [id number] BIRTHDATE: 1935-04-20 , 80  yrs. old GENDER: male  ENDOSCOPIST: Danie Binder, MD REFERRED CV:[id number] Dellia Nims, M.D.  PROCEDURE DATE: 05/30/2015 PROCEDURE:   EGD w/ biopsy  INDICATIONS:hemocult positive stool.   anemia. MEDICATIONS: TCS+ Versed 1 mg IV  MD INITIATED /ORDERED SEDATION: 1056, LAST DOSE: 1101. END OF TCS/EGD: 1138 TOPICAL ANESTHETIC:   Viscous Xylocaine ASA CLASS:  DESCRIPTION OF PROCEDURE:     Physical exam was performed.  Informed consent was obtained from the patient after explaining the benefits, risks, and alternatives to the procedure.  The patient was connected to the monitor and placed in the left lateral position.  Continuous oxygen was provided by nasal cannula and IV medicine administered through an indwelling cannula.  After administration of sedation, the patients esophagus was intubated and the EC-3890Li ([id number])  endoscope was advanced under direct visualization to the second portion of the duodenum.  The scope was removed slowly by carefully examining the color, texture, anatomy, and integrity of the mucosa on the way out.  The patient was recovered in endoscopy and discharged home in satisfactory condition.  Estimated blood loss is zero unless otherwise noted in this procedure report.    ESOPHAGUS: The mucosa of the esophagus appeared normal.   STOMACH: Mild non-erosive gastritis (inflammation) was found in the gastric antrum.  Multiple biopsies were performed using cold forceps. SINGLE GASTRIC AVM IN THE ANTRUM.   DUODENUM: Multiple arteriovenous malformations ranging between 1-19m in size were found in the 2nd part of the duodenum.   The duodenal mucosa showed no abnormalities in the duodenal bulb. COMPLICATIONS: There were no immediate complications.  ENDOSCOPIC IMPRESSION: 1.   MILD Non-erosive  gastritis 2.   HEME POSITIVE STOOLS/ANEMIA MOST LIKELY DUE TO GASTRIC/DUODENAL AVMs  RECOMMENDATIONS: INCREASE ferrous sulfate 325 mg THREE TIMES A DAY. FOLLOW PREVIOUS DIET. AWAIT BIOPSY RESULTS. FULL LIQUIDS JAN 28.  CLEAR LIQUIDS JAN 29.  Next colonoscopy MON JAN 30. CONTINUE GLUCOPHAGE ON DAY OF COLONOSCOPY. Follow up in 4 mos.  REPEAT EXAM:    eSigned:  SDanie Binder MD [id number] PMNrevised  CPT CODES: ICD CODES:  The ICD and CPT codes recommended by this software are interpretations from the data that the clinical staff has captured with the software.  The verification of the translation of this report to the ICD and CPT codes and modifiers is the sole responsibility of the health care institution and practicing physician where this report was generated.  [place] will not be held responsible for the validity of the ICD and CPT codes included on this report.  AMA assumes no liability for data contained or not contained herein. CPT is a rDesigner, television/film setof the [employer]

## 2015-05-14 NOTE — H&P (View-Only) (Signed)
   Subjective:    Patient ID: [name], male    DOB: June 02, 1934, 81 y.o.   MRN: [id number]  Neale Burly, MD  HPI   Past Medical History  Diagnosis Date  . CAD (coronary artery disease)     multi vessel. s/p recent bare metal stenting of high grade proximal R CA stenosis. residual 70% : main in-stent restenosis, with distal filling of the left anterior descending from graft flow. patent bypass graft, except for 100% occlusion of saphernous vein graft - 1st diagonal branch. 3-vessel CABG 2000.  Marland Kitchen CAD (coronary artery disease)     s/p of 95% L main artery stenosis, 4/03: cutting balloon PTCA of 80% in-stent restenosis, 7/04. preserved L ventriculr function  . Carotid bruit     bilateral. no sigificant distal abd atherosclerosis by recent cath.   Marland Kitchen COPD (chronic obstructive pulmonary disease) (HCC)     ongoing tobacco   . DM2 (diabetes mellitus, type 2) ([place])   . Dyslipidemia   . HTN (hypertension)   . Glaucoma   . CVA (cerebral infarction)   . Dysphagia   . Constipation    Past Surgical History  Procedure Laterality Date  . Cardiac catheterization      No Known Allergies   Review of Systems PER HPI OTHERWISE ALL SYSTEMS ARE NEGATIVE.    Objective:   Physical Exam        Assessment & Plan:

## 2015-05-14 NOTE — Interval H&P Note (Signed)
History and Physical Interval Note:  05/14/2015 10:51 AM  [name]  has presented today for surgery, with the diagnosis of HEME POSITIVE STOOLS/ANEMIA  The various methods of treatment have been discussed with the patient and family. After consideration of risks, benefits and other options for treatment, the patient has consented to  Procedure(s) with comments: COLONOSCOPY (N/A) - 1000 ESOPHAGOGASTRODUODENOSCOPY (EGD) (N/A) as a surgical intervention .  The patient's history has been reviewed, patient examined, no change in status, stable for surgery.  I have reviewed the patient's chart and labs.  Questions were answered to the patient's satisfaction.     [employer]

## 2015-05-14 NOTE — Op Note (Addendum)
[facility] [address] [place], [address]   COLONOSCOPY PROCEDURE REPORT  PATIENT: [name], [name]  MR#: [id number] BIRTHDATE: 26-May-1934 , 80  yrs. old GENDER: male ENDOSCOPIST: Danie Binder, MD REFERRED EB:[id number] Dellia Nims, M.D. PROCEDURE DATE:  2015/06/05 PROCEDURE:   Colonoscopy, diagnostic INDICATIONS:heme-positive stool.   ANEMIA MEDICATIONS: Demerol 50 mg IV and Versed 3 mg IV  MD INITIATED/ORDERED SEDATION: 1056, LAST DOSE: 1101. END OF TCS/EGD: 1138  DESCRIPTION OF PROCEDURE:    Physical exam was performed.  Informed consent was obtained from the patient after explaining the benefits, risks, and alternatives to procedure.  The patient was connected to monitor and placed in left lateral position. Continuous oxygen was provided by nasal cannula and IV medicine administered through an indwelling cannula.  After administration of sedation and rectal exam, the patients rectum was intubated and the EC-3890Li ([id number])  colonoscope was advanced under direct visualization to the ASCENDING COLON.  The scope was removed slowly by carefully examining the color, texture, anatomy, and integrity mucosa on the way out.  The patient was recovered in endoscopy and discharged home in satisfactory condition. Estimated blood loss is zero unless otherwise noted in this procedure report.     COLON FINDINGS: POOR PREP THORUGHOUT COLON.  LARGE AMOUNT OF LIQUID STOOL IN COLON.  IT COATED THE LENS OF SCOPE AND LIMITED VISUALIZATION and Moderate sized internal hemorrhoids were found.   PREP QUALITY: poor.  CECAL W/D TIME: 6       minutes COMPLICATIONS: None  ENDOSCOPIC IMPRESSION: 1.   POOR PREP THORUGHOUT COLON.  LARGE AMOUNT OF LIQUID STOOL IN COLON.  IT COATED TEH LENS OF SCOPE AND LIMITED VISUALIZATION 2.   Moderate sized internal hemorrhoids  RECOMMENDATIONS: INCREASE ferrous sulfate 325 mg THREE TIMES A DAY. FOLLOW PREVIOUS DIET. AWAIT BIOPSY RESULTS . FULL  LIQUIDS JAN 28.  CLEAR LIQUIDS JAN 29.  Next colonoscopy MON JAN 30. CONTINUE GLUCOPHAGE ON DAY OF COLONOSCOPY. Follow up in 4 mos.     _______________________________ Lorrin Mais:  Danie Binder, MD 05-Jun-2015 6:42 PM Revised: 06-05-2015 6:42 PM CPT CODES: ICD CODES:  The ICD and CPT codes recommended by this software are interpretations from the data that the clinical staff has captured with the software.  The verification of the translation of this report to the ICD and CPT codes and modifiers is the sole responsibility of the health care institution and practicing physician where this report was generated.  [place]. will not be held responsible for the validity of the ICD and CPT codes included on this report.  AMA assumes  no liability for data contained or not contained herein. CPT is a Designer, television/film set of the [employer].

## 2015-05-15 ENCOUNTER — Encounter (HOSPITAL_COMMUNITY)
Admission: AD | Admit: 2015-05-15 | Discharge: 2015-05-15 | Disposition: A | Discharge status: Home / Self Care | Payer: Medicare Other | Source: Skilled Nursing Facility | Attending: Internal Medicine | Admitting: Internal Medicine

## 2015-05-15 ENCOUNTER — Other Ambulatory Visit: Payer: Self-pay

## 2015-05-15 DIAGNOSIS — D649 Anemia, unspecified: Secondary | ICD-10-CM

## 2015-05-15 DIAGNOSIS — D500 Iron deficiency anemia secondary to blood loss (chronic): Secondary | ICD-10-CM | POA: Diagnosis not present

## 2015-05-15 DIAGNOSIS — E785 Hyperlipidemia, unspecified: Secondary | ICD-10-CM | POA: Diagnosis not present

## 2015-05-15 DIAGNOSIS — I10 Essential (primary) hypertension: Secondary | ICD-10-CM | POA: Diagnosis not present

## 2015-05-15 LAB — BASIC METABOLIC PANEL
Anion gap: 7 (ref 5–15)
BUN: 17 mg/dL (ref 6–20)
CHLORIDE: 112 mmol/L — AB (ref 101–111)
CO2: 24 mmol/L (ref 22–32)
CREATININE: 0.87 mg/dL (ref 0.61–1.24)
Calcium: 8.7 mg/dL — ABNORMAL LOW (ref 8.9–10.3)
GFR calc Af Amer: >60 mL/min (ref >60)
GFR calc non Af Amer: >60 mL/min (ref >60)
Glucose, Bld: 93 mg/dL (ref 65–99)
POTASSIUM: 3.7 mmol/L (ref 3.5–5.1)
SODIUM: 143 mmol/L (ref 135–145)

## 2015-05-15 LAB — LIPID PANEL
CHOL/HDL RATIO: 3.1 ratio
CHOLESTEROL: 81 mg/dL (ref 0–200)
HDL: 26 mg/dL — ABNORMAL LOW (ref >40)
LDL CALC: 40 mg/dL (ref 0–99)
Triglycerides: 73 mg/dL (ref <150)
VLDL: 15 mg/dL (ref 0–40)

## 2015-05-15 LAB — GLUCOSE, CAPILLARY: Glucose-Capillary: 77 mg/dL (ref 65–99)

## 2015-05-15 NOTE — Telephone Encounter (Signed)
Pt is set up for TCS on 06/02/15 @ 1:30. Instructions have been faxed to the nursing home along with the Rx.

## 2015-05-16 ENCOUNTER — Encounter (HOSPITAL_COMMUNITY): Payer: Self-pay | Admitting: Gastroenterology

## 2015-05-21 ENCOUNTER — Encounter (HOSPITAL_COMMUNITY)
Admission: AD | Admit: 2015-05-21 | Discharge: 2015-05-21 | Disposition: A | Discharge status: Home / Self Care | Payer: Medicare Other | Source: Skilled Nursing Facility | Attending: Internal Medicine | Admitting: Internal Medicine

## 2015-05-21 DIAGNOSIS — D500 Iron deficiency anemia secondary to blood loss (chronic): Secondary | ICD-10-CM | POA: Diagnosis not present

## 2015-05-21 DIAGNOSIS — I10 Essential (primary) hypertension: Secondary | ICD-10-CM | POA: Diagnosis not present

## 2015-05-21 DIAGNOSIS — E785 Hyperlipidemia, unspecified: Secondary | ICD-10-CM | POA: Diagnosis not present

## 2015-05-21 LAB — CBC WITH DIFFERENTIAL/PLATELET
BASOS PCT: 1 %
Basophils Absolute: 0 10*3/uL (ref 0.0–0.1)
EOS ABS: 0.5 10*3/uL (ref 0.0–0.7)
EOS PCT: 7 %
HCT: 27.3 % — ABNORMAL LOW (ref 39.0–52.0)
HEMOGLOBIN: 9.1 g/dL — AB (ref 13.0–17.0)
Lymphocytes Relative: 27 %
Lymphs Abs: 1.9 10*3/uL (ref 0.7–4.0)
MCH: 28.5 pg (ref 26.0–34.0)
MCHC: 33.3 g/dL (ref 30.0–36.0)
MCV: 85.6 fL (ref 78.0–100.0)
MONO ABS: 0.3 10*3/uL (ref 0.1–1.0)
MONOS PCT: 5 %
NEUTROS PCT: 60 %
Neutro Abs: 4.2 10*3/uL (ref 1.7–7.7)
PLATELETS: 344 10*3/uL (ref 150–400)
RBC: 3.19 MIL/uL — ABNORMAL LOW (ref 4.22–5.81)
RDW: 14.2 % (ref 11.5–15.5)
WBC: 7 10*3/uL (ref 4.0–10.5)

## 2015-05-21 LAB — BASIC METABOLIC PANEL
Anion gap: 9 (ref 5–15)
BUN: 13 mg/dL (ref 6–20)
CALCIUM: 9.3 mg/dL (ref 8.9–10.3)
CO2: 26 mmol/L (ref 22–32)
CREATININE: 0.73 mg/dL (ref 0.61–1.24)
Chloride: 107 mmol/L (ref 101–111)
GFR calc non Af Amer: >60 mL/min (ref >60)
Glucose, Bld: 91 mg/dL (ref 65–99)
Potassium: 3.5 mmol/L (ref 3.5–5.1)
SODIUM: 142 mmol/L (ref 135–145)

## 2015-05-28 ENCOUNTER — Ambulatory Visit: Payer: Medicare Other | Admitting: Gastroenterology

## 2015-06-02 ENCOUNTER — Ambulatory Visit (HOSPITAL_COMMUNITY)
Admission: RE | Admit: 2015-06-02 | Discharge: 2015-06-02 | Disposition: A | Discharge status: Skilled Nursing Facility | Payer: Medicare Other | Source: Ambulatory Visit | Attending: Gastroenterology | Admitting: Gastroenterology

## 2015-06-02 ENCOUNTER — Encounter (HOSPITAL_COMMUNITY): Payer: Self-pay | Admitting: *Deleted

## 2015-06-02 ENCOUNTER — Encounter (HOSPITAL_COMMUNITY)
Admission: RE | Disposition: A | Discharge status: Skilled Nursing Facility | Payer: Self-pay | Source: Ambulatory Visit | Attending: Gastroenterology

## 2015-06-02 DIAGNOSIS — J449 Chronic obstructive pulmonary disease, unspecified: Secondary | ICD-10-CM | POA: Insufficient documentation

## 2015-06-02 DIAGNOSIS — E785 Hyperlipidemia, unspecified: Secondary | ICD-10-CM | POA: Insufficient documentation

## 2015-06-02 DIAGNOSIS — Z8673 Personal history of transient ischemic attack (TIA), and cerebral infarction without residual deficits: Secondary | ICD-10-CM | POA: Insufficient documentation

## 2015-06-02 DIAGNOSIS — Z87891 Personal history of nicotine dependence: Secondary | ICD-10-CM | POA: Diagnosis not present

## 2015-06-02 DIAGNOSIS — I2510 Atherosclerotic heart disease of native coronary artery without angina pectoris: Secondary | ICD-10-CM | POA: Diagnosis not present

## 2015-06-02 DIAGNOSIS — Q438 Other specified congenital malformations of intestine: Secondary | ICD-10-CM | POA: Diagnosis not present

## 2015-06-02 DIAGNOSIS — E119 Type 2 diabetes mellitus without complications: Secondary | ICD-10-CM | POA: Diagnosis not present

## 2015-06-02 DIAGNOSIS — D649 Anemia, unspecified: Secondary | ICD-10-CM | POA: Diagnosis not present

## 2015-06-02 DIAGNOSIS — I10 Essential (primary) hypertension: Secondary | ICD-10-CM | POA: Insufficient documentation

## 2015-06-02 DIAGNOSIS — Z79899 Other long term (current) drug therapy: Secondary | ICD-10-CM | POA: Insufficient documentation

## 2015-06-02 DIAGNOSIS — D124 Benign neoplasm of descending colon: Secondary | ICD-10-CM | POA: Diagnosis not present

## 2015-06-02 DIAGNOSIS — Z951 Presence of aortocoronary bypass graft: Secondary | ICD-10-CM | POA: Insufficient documentation

## 2015-06-02 DIAGNOSIS — K648 Other hemorrhoids: Secondary | ICD-10-CM | POA: Diagnosis not present

## 2015-06-02 DIAGNOSIS — D123 Benign neoplasm of transverse colon: Secondary | ICD-10-CM | POA: Diagnosis not present

## 2015-06-02 DIAGNOSIS — Z7984 Long term (current) use of oral hypoglycemic drugs: Secondary | ICD-10-CM | POA: Insufficient documentation

## 2015-06-02 DIAGNOSIS — D128 Benign neoplasm of rectum: Secondary | ICD-10-CM | POA: Insufficient documentation

## 2015-06-02 DIAGNOSIS — Z7902 Long term (current) use of antithrombotics/antiplatelets: Secondary | ICD-10-CM | POA: Insufficient documentation

## 2015-06-02 HISTORY — PX: COLONOSCOPY: SHX5424

## 2015-06-02 LAB — GLUCOSE, CAPILLARY
Glucose-Capillary: 66 mg/dL (ref 65–99)
Glucose-Capillary: 104 mg/dL — ABNORMAL HIGH (ref 65–99)

## 2015-06-02 SURGERY — COLONOSCOPY
Anesthesia: Moderate Sedation

## 2015-06-02 MED ORDER — MEPERIDINE HCL 100 MG/ML IJ SOLN
INTRAMUSCULAR | Status: AC
  Filled 2015-06-02: qty 2

## 2015-06-02 MED ORDER — SODIUM CHLORIDE 0.9 % IV SOLN: INTRAVENOUS | Status: DC

## 2015-06-02 MED ORDER — MIDAZOLAM HCL 5 MG/5ML IJ SOLN
INTRAMUSCULAR | Status: DC | PRN
  Administered 2015-06-02: 2 mg via INTRAVENOUS
  Administered 2015-06-02: 1 mg via INTRAVENOUS

## 2015-06-02 MED ORDER — SIMETHICONE 40 MG/0.6ML PO SUSP
ORAL | Status: DC | PRN
  Administered 2015-06-02: 14:00:00

## 2015-06-02 MED ORDER — MEPERIDINE HCL 100 MG/ML IJ SOLN
INTRAMUSCULAR | Status: DC | PRN
  Administered 2015-06-02 (×2): 25 mg

## 2015-06-02 MED ORDER — MIDAZOLAM HCL 5 MG/5ML IJ SOLN
INTRAMUSCULAR | Status: AC
  Filled 2015-06-02: qty 10

## 2015-06-02 MED ORDER — DEXTROSE IN LACTATED RINGERS 5 % IV SOLN
INTRAVENOUS | Status: DC
  Administered 2015-06-02: 500 mL via INTRAVENOUS

## 2015-06-02 NOTE — H&P (Signed)
Primary Care Physician:  Neale Burly, MD Primary Gastroenterologist:  Dr. Oneida Alar  Pre-Procedure History & Physical: HPI:  [name] is a 81 y.o. male here for Anemia-normocytic.  Past Medical History  Diagnosis Date  . CAD (coronary artery disease)     cath 11/2007: 70% LM ISR, SVG-LAD OK, SVG-D1 100%, IMA-D2 OK, SVG-OM OK, 4-vessel CABG 2000.  Marland Kitchen CAD (coronary artery disease)     s/p of 95% L main artery stenosis, 4/03: cutting balloon PTCA of 80% in-stent restenosis, 7/04. preserved L ventriculr function  . Carotid bruit     bilateral. no sigificant distal abd atherosclerosis by recent cath.   Marland Kitchen COPD (chronic obstructive pulmonary disease) (HCC)     ongoing tobacco   . DM2 (diabetes mellitus, type 2) ([place])   . Dyslipidemia   . HTN (hypertension)   . Glaucoma   . CVA (cerebral infarction)   . Dysphagia   . Constipation     Past Surgical History  Procedure Laterality Date  . Cardiac catheterization    . Colonoscopy N/A 05/14/2015    Procedure: COLONOSCOPY;  Surgeon: Danie Binder, MD;  Location: [facility];  Service: Endoscopy;  Laterality: N/A;  1000  . Esophagogastroduodenoscopy N/A 05/14/2015    Procedure: ESOPHAGOGASTRODUODENOSCOPY (EGD);  Surgeon: Danie Binder, MD;  Location: [facility];  Service: Endoscopy;  Laterality: N/A;    Prior to Admission medications   Medication Sig Start Date End Date Taking? Authorizing Provider  bisacodyl (DULCOLAX) 10 MG suppository Place 1 suppository (10 mg total) rectally daily as needed (no BM in 48 hours). 03/10/15  Yes Janece Canterbury, MD  bisacodyl (DULCOLAX) 5 MG EC tablet Take 10 mg by mouth daily as needed for moderate constipation.   Yes Historical Provider, MD  clopidogrel (PLAVIX) 75 MG tablet Take 75 mg by mouth daily.   Yes Historical Provider, MD  docusate sodium (COLACE) 100 MG capsule Take 100 mg by mouth 2 (two) times daily.   Yes Historical Provider, MD  feeding supplement, ENSURE ENLIVE, (ENSURE ENLIVE)  LIQD Take 237 mLs by mouth at bedtime.   Yes Historical Provider, MD  ferrous sulfate 325 (65 FE) MG tablet Take 1 tablet (325 mg total) by mouth 3 (three) times daily with meals. 05/14/15  Yes Danie Binder, MD  labetalol (NORMODYNE) 200 MG tablet Take 1 tablet (200 mg total) by mouth 3 (three) times daily. 03/10/15  Yes Janece Canterbury, MD  lisinopril (PRINIVIL,ZESTRIL) 20 MG tablet Take 20 mg by mouth daily.     Yes Historical Provider, MD  Maltodextrin-Xanthan Gum ([place]) POWD Per instructions to create nectar thick liquids 03/10/15  Yes Janece Canterbury, MD  metFORMIN (GLUCOPHAGE) 500 MG tablet Take 500 mg by mouth 2 (two) times daily with a meal.   Yes Historical Provider, MD  mirtazapine (REMERON) 7.5 MG tablet Take 7.5 mg by mouth at bedtime.   Yes Historical Provider, MD  Omega-3 Fatty Acids (FISH OIL) 1000 MG CAPS Take 1,000 mg by mouth 3 (three) times daily.   Yes Historical Provider, MD  omeprazole (PRILOSEC OTC) 20 MG tablet Take 20 mg by mouth at bedtime.   Yes Historical Provider, MD  pantoprazole (PROTONIX) 40 MG tablet Take 40 mg by mouth daily.   Yes Historical Provider, MD  polyethylene glycol powder (MIRALAX) powder Take 17 g by mouth 2 (two) times daily. 03/10/15  Yes Janece Canterbury, MD  Potassium Chloride ER 20 MEQ TBCR Take 1 tablet by mouth daily.  Yes Historical Provider, MD  potassium chloride SA (K-DUR,KLOR-CON) 20 MEQ tablet Take 20 mEq by mouth daily.  05/08/15  Yes Historical Provider, MD  pravastatin (PRAVACHOL) 80 MG tablet Take 1 tablet (80 mg total) by mouth daily at 6 PM. 03/10/15  Yes Janece Canterbury, MD  senna (SENOKOT) 8.6 MG TABS tablet Take 2 tablets (17.2 mg total) by mouth daily. 03/10/15  Yes Janece Canterbury, MD  tamsulosin (FLOMAX) 0.4 MG CAPS capsule Take 0.4 mg by mouth daily after supper.   Yes Historical Provider, MD  ALPRAZolam Duanne Moron) 0.25 MG tablet Take one tablet by mouth three times daily for anxiety Patient not taking: Reported on  04/05/2015 03/31/15   Tiffany L Reed, DO  furosemide (LASIX) 20 MG tablet Take 20 mg by mouth daily. Reported on 04/18/2015    Historical Provider, MD  isosorbide-hydrALAZINE (BIDIL) 20-37.5 MG tablet Take 1 tablet by mouth 3 (three) times daily. Patient taking differently: Take 0.5 tablets by mouth 2 (two) times daily.  03/10/15   Janece Canterbury, MD    Allergies as of 05/15/2015  . (No Known Allergies)    Family History  Problem Relation Age of Onset  . Hypertension Mother     Social History   Social History  . Marital Status: Married    Spouse Name: N/A  . Number of Children: N/A  . Years of Education: N/A   Occupational History  . Not on file.   Social History Main Topics  . Smoking status: Former Smoker    Quit date: 08/03/2014  . Smokeless tobacco: Not on file     Comment: 50 pack year hx   . Alcohol Use: No     Comment: has not had alcohol in 30-40 years   . Drug Use: No  . Sexual Activity: Not on file   Other Topics Concern  . Not on file   Social History Narrative    Review of Systems: See HPI, otherwise negative ROS   Physical Exam: There were no vitals taken for this visit. General:   Alert,  pleasant and cooperative in NAD Head:  Normocephalic and atraumatic. Neck:  Supple; Lungs:  Clear throughout to auscultation.    Heart:  Regular rate and rhythm. Abdomen:  Soft, nontender and nondistended. Normal bowel sounds, without guarding, and without rebound.   Neurologic:  Alert and  oriented x4;  NO  NEW FOCAL DEFICITS  Impression/Plan:   Anemia-normocytic  PLAN: TCS TODAY

## 2015-06-02 NOTE — Op Note (Signed)
[facility] [address] [place], [address]   COLONOSCOPY PROCEDURE REPORT  PATIENT: [name], [name]  MR#: [id number] BIRTHDATE: 1935/02/18 , 80  yrs. old GENDER: male ENDOSCOPIST: Danie Binder, MD REFERRED JF:[id number] Hasanaj, M.D. PROCEDURE DATE:  Jun 17, 2015 PROCEDURE:   Colonoscopy with snare polypectomy INDICATIONS:anemia, non-specific. MEDICATIONS: Demerol 50 mg IV and Versed 3 mg IV  DESCRIPTION OF PROCEDURE:    Physical exam was performed.  Informed consent was obtained from the patient after explaining the benefits, risks, and alternatives to procedure.  The patient was connected to monitor and placed in left lateral position. Continuous oxygen was provided by nasal cannula and IV medicine administered through an indwelling cannula.  After administration of sedation and rectal exam, the patients rectum was intubated and the EC-[id number] ([id number])  colonoscope was advanced under direct visualization to the cecum.  The scope was removed slowly by carefully examining the color, texture, anatomy, and integrity mucosa on the way out.  The patient was recovered in endoscopy and discharged home in satisfactory condition. Estimated blood loss is zero unless otherwise noted in this procedure report.    COLON FINDINGS: Four sessile polyps ranging from 5 to 73m in size were found in the descending colon(1), rectum(1), and transverse colon.  A polypectomy was performed using snare cautery.  , The colon was redundant.  Manual abdominal counter-pressure was used to reach the cecum, and Moderate sized internal hemorrhoids were found.  PREP QUALITY: good.  CECAL W/D TIME: 27       minutes COMPLICATIONS: None  ENDOSCOPIC IMPRESSION: 1.   Four COLORECTAL polyps REMOVED . NO SOURCE FOR ANEMIA IDENTIFIED. 2.   The LEFT colon IS redundant 3.   Moderate sized internal hemorrhoids  RECOMMENDATIONS: HOLD IRON FOR 7 DAYS FOLLOWED BY [facility] CAPSULE STUDY. FOLLOW A HIGH  FIBER DIET. AWAIT BIOPSY RESULTS.      _______________________________ eLorrin MaisDanie Binder MD 014-Feb-20172:[id number] PM    CPT CODES: ICD CODES:  The ICD and CPT codes recommended by this software are interpretations from the data that the clinical staff has captured with the software.  The verification of the translation of this report to the ICD and CPT codes and modifiers is the sole responsibility of the health care institution and practicing physician where this report was generated.  [facility] will not be held responsible for the validity of the ICD and CPT codes included on this report.  AMA assumes no liability for data contained or not contained herein. CPT is a rDesigner, television/film setof the [employer]

## 2015-06-02 NOTE — Discharge Instructions (Signed)
HE had 4 polyps removed. HE HAS internal hemorrhoids. NO OBVIOUS SOURCE FOR HIS LOW BLOOD COUNT HAS BEEN IDENTIFIED.   HE NEEDS A [facility] CAPSULE ENDOSCOPY. HOLD IRON FOR 7 DAYS. Office will call to schedule this.  FOLLOW A HIGH FIBER DIET. AVOID ITEMS THAT CAUSE BLOATING & GAS. SEE INFO BELOW.  YOUR BIOPSY RESULTS WILL BE AVAILABLE IN MY CHART FEB 2 AND MY OFFICE WILL CONTACT YOU IN 10-14 DAYS WITH YOUR RESULTS.      Colonoscopy Care After Read the instructions outlined below and refer to this sheet in the next week. These discharge instructions provide you with general information on caring for yourself after you leave the hospital. While your treatment has been planned according to the most current medical practices available, unavoidable complications occasionally occur. If you have any problems or questions after discharge, call DR. FIELDS, [phone].  ACTIVITY  You may resume your regular activity, but move at a slower pace for the next 24 hours.   Take frequent rest periods for the next 24 hours.   Walking will help get rid of the air and reduce the bloated feeling in your belly (abdomen).   No driving for 24 hours (because of the medicine (anesthesia) used during the test).   You may shower.   Do not sign any important legal documents or operate any machinery for 24 hours (because of the anesthesia used during the test).    NUTRITION  Drink plenty of fluids.   You may resume your normal diet as instructed by your doctor.   Begin with a light meal and progress to your normal diet. Heavy or fried foods are harder to digest and may make you feel sick to your stomach (nauseated).   Avoid alcoholic beverages for 24 hours or as instructed.    MEDICATIONS  You may resume your normal medications.   WHAT YOU CAN EXPECT TODAY  Some feelings of bloating in the abdomen.   Passage of more gas than usual.   Spotting of blood in your stool or on the toilet paper  .    IF YOU HAD POLYPS REMOVED DURING THE COLONOSCOPY:  Eat a soft diet IF YOU HAVE NAUSEA, BLOATING, ABDOMINAL PAIN, OR VOMITING.    FINDING OUT THE RESULTS OF YOUR TEST Not all test results are available during your visit. DR. Oneida Alar WILL CALL YOU WITHIN 14 DAYS OF YOUR PROCEDUE WITH YOUR RESULTS. Do not assume everything is normal if you have not heard from DR. FIELDS, CALL HER OFFICE AT [phone].  SEEK IMMEDIATE MEDICAL ATTENTION AND CALL THE OFFICE: [phone] IF:  You have more than a spotting of blood in your stool.   Your belly is swollen (abdominal distention).   You are nauseated or vomiting.   You have a temperature over 101F.   You have abdominal pain or discomfort that is severe or gets worse throughout the day.   High-Fiber Diet A high-fiber diet changes your normal diet to include more whole grains, legumes, fruits, and vegetables. Changes in the diet involve replacing refined carbohydrates with unrefined foods. The calorie level of the diet is essentially unchanged. The Dietary Reference Intake (recommended amount) for adult males is 38 grams per day. For adult females, it is 25 grams per day. Pregnant and lactating women should consume 28 grams of fiber per day. Fiber is the intact part of a plant that is not broken down during digestion. Functional fiber is fiber that has been isolated from the plant to provide  a beneficial effect in the body. PURPOSE  Increase stool bulk.   Ease and regulate bowel movements.   Lower cholesterol.  INDICATIONS THAT YOU NEED MORE FIBER  Constipation and hemorrhoids.   Uncomplicated diverticulosis (intestine condition) and irritable bowel syndrome.   Weight management.   As a protective measure against hardening of the arteries (atherosclerosis), diabetes, and cancer.   GUIDELINES FOR INCREASING FIBER IN THE DIET  Start adding fiber to the diet slowly. A gradual increase of about 5 more grams (2 slices of whole-wheat  bread, 2 servings of most fruits or vegetables, or 1 bowl of high-fiber cereal) per day is best. Too rapid an increase in fiber may result in constipation, flatulence, and bloating.   Drink enough water and fluids to keep your urine clear or pale yellow. Water, juice, or caffeine-free drinks are recommended. Not drinking enough fluid may cause constipation.   Eat a variety of high-fiber foods rather than one type of fiber.   Try to increase your intake of fiber through using high-fiber foods rather than fiber pills or supplements that contain small amounts of fiber.   The goal is to change the types of food eaten. Do not supplement your present diet with high-fiber foods, but replace foods in your present diet.  INCLUDE A VARIETY OF FIBER SOURCES  Replace refined and processed grains with whole grains, canned fruits with fresh fruits, and incorporate other fiber sources. White rice, white breads, and most bakery goods contain little or no fiber.   Brown whole-grain rice, buckwheat oats, and many fruits and vegetables are all good sources of fiber. These include: broccoli, Brussels sprouts, cabbage, cauliflower, beets, sweet potatoes, white potatoes (skin on), carrots, tomatoes, eggplant, squash, berries, fresh fruits, and dried fruits.   Cereals appear to be the richest source of fiber. Cereal fiber is found in whole grains and bran. Bran is the fiber-rich outer coat of cereal grain, which is largely removed in refining. In whole-grain cereals, the bran remains. In breakfast cereals, the largest amount of fiber is found in those with "bran" in their names. The fiber content is sometimes indicated on the label.   You may need to include additional fruits and vegetables each day.   In baking, for 1 cup white flour, you may use the following substitutions:   1 cup whole-wheat flour minus 2 tablespoons.   1/2 cup white flour plus 1/2 cup whole-wheat flour.

## 2015-06-03 ENCOUNTER — Telehealth: Payer: Self-pay

## 2015-06-03 ENCOUNTER — Inpatient Hospital Stay
Admission: RE | Admit: 2015-06-03 | Discharge: 2015-06-17 | Disposition: A | Discharge status: Nursing Home (Includes ICF) | Payer: Medicare Other | Source: Ambulatory Visit | Attending: Internal Medicine | Admitting: Internal Medicine

## 2015-06-03 DIAGNOSIS — R1312 Dysphagia, oropharyngeal phase: Secondary | ICD-10-CM | POA: Diagnosis not present

## 2015-06-03 NOTE — Telephone Encounter (Addendum)
Inez Catalina from [facility] called to set up GIVEN. Please advise

## 2015-06-04 DIAGNOSIS — R1312 Dysphagia, oropharyngeal phase: Secondary | ICD-10-CM | POA: Diagnosis not present

## 2015-06-04 NOTE — Telephone Encounter (Signed)
HE NEEDS A [facility]ENS CAPSULE ENDOSCOPY WITHIN THE NEXT 2 WEEKS. HOLD IRON FOR 7 DAYS.

## 2015-06-05 DIAGNOSIS — R1312 Dysphagia, oropharyngeal phase: Secondary | ICD-10-CM | POA: Diagnosis not present

## 2015-06-06 ENCOUNTER — Encounter (HOSPITAL_COMMUNITY): Payer: Self-pay | Admitting: Gastroenterology

## 2015-06-06 DIAGNOSIS — R1312 Dysphagia, oropharyngeal phase: Secondary | ICD-10-CM | POA: Diagnosis not present

## 2015-06-09 ENCOUNTER — Other Ambulatory Visit: Payer: Self-pay

## 2015-06-09 DIAGNOSIS — R1312 Dysphagia, oropharyngeal phase: Secondary | ICD-10-CM | POA: Diagnosis not present

## 2015-06-09 DIAGNOSIS — D649 Anemia, unspecified: Secondary | ICD-10-CM

## 2015-06-09 NOTE — Telephone Encounter (Signed)
[facility] Approved. PA # is [id number]

## 2015-06-09 NOTE — Telephone Encounter (Signed)
Faxed instructions to Inez Catalina at the [facility]

## 2015-06-10 DIAGNOSIS — R1312 Dysphagia, oropharyngeal phase: Secondary | ICD-10-CM | POA: Diagnosis not present

## 2015-06-11 DIAGNOSIS — R1312 Dysphagia, oropharyngeal phase: Secondary | ICD-10-CM | POA: Diagnosis not present

## 2015-06-12 DIAGNOSIS — R1312 Dysphagia, oropharyngeal phase: Secondary | ICD-10-CM | POA: Diagnosis not present

## 2015-06-13 DIAGNOSIS — R1312 Dysphagia, oropharyngeal phase: Secondary | ICD-10-CM | POA: Diagnosis not present

## 2015-06-16 DIAGNOSIS — R1312 Dysphagia, oropharyngeal phase: Secondary | ICD-10-CM | POA: Diagnosis not present

## 2015-06-17 ENCOUNTER — Ambulatory Visit (HOSPITAL_COMMUNITY)
Admission: RE | Admit: 2015-06-17 | Discharge: 2015-06-17 | Disposition: A | Discharge status: Home / Self Care | Payer: Medicare Other | Source: Ambulatory Visit | Attending: Gastroenterology | Admitting: Gastroenterology

## 2015-06-17 ENCOUNTER — Encounter (HOSPITAL_COMMUNITY)
Admission: RE | Disposition: A | Discharge status: Home / Self Care | Payer: Self-pay | Source: Ambulatory Visit | Attending: Gastroenterology

## 2015-06-17 ENCOUNTER — Inpatient Hospital Stay
Admission: RE | Admit: 2015-06-17 | Discharge: 2017-07-22 | Disposition: A | Discharge status: Nursing Home (Includes ICF) | Payer: Medicare Other | Source: Ambulatory Visit | Attending: Internal Medicine | Admitting: Internal Medicine

## 2015-06-17 DIAGNOSIS — Q2733 Arteriovenous malformation of digestive system vessel: Secondary | ICD-10-CM

## 2015-06-17 DIAGNOSIS — D509 Iron deficiency anemia, unspecified: Secondary | ICD-10-CM | POA: Diagnosis not present

## 2015-06-17 DIAGNOSIS — R1312 Dysphagia, oropharyngeal phase: Secondary | ICD-10-CM | POA: Diagnosis not present

## 2015-06-17 HISTORY — PX: GIVENS CAPSULE STUDY: SHX5432

## 2015-06-17 SURGERY — IMAGING PROCEDURE, GI TRACT, INTRALUMINAL, VIA CAPSULE

## 2015-06-18 DIAGNOSIS — R1312 Dysphagia, oropharyngeal phase: Secondary | ICD-10-CM | POA: Diagnosis not present

## 2015-06-19 ENCOUNTER — Encounter (HOSPITAL_COMMUNITY): Payer: Self-pay | Admitting: Gastroenterology

## 2015-06-19 ENCOUNTER — Telehealth: Payer: Self-pay | Admitting: Gastroenterology

## 2015-06-19 NOTE — Telephone Encounter (Signed)
I called and pt's nurse, [place], has gone to lunch.

## 2015-06-19 NOTE — Procedures (Signed)
  PATIENT DATA: GASTRIC PASSAGE TIME:  1H 10 m, SB PASSAGE TIME: 3H 66m RESULTS: LIMITED views of gastric mucosa due to retained contents. No blood in the stomach. FREQUENT AVMs IN SMALL BOWEL (1:14-3:39:31)No masses,OR ULCERS SEEN. NO OLD BLOOD OR FRESH BLOOD SEEN. LIMITED VIEWS OF THE COLON DUE TO RETAINED CONTENTS.  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 FESO4 TID 2. AVOID ASA/NSAIDS UNLESS MEDICALLY NECESSARY 3. OPV IN JUN 2017

## 2015-06-19 NOTE — Telephone Encounter (Signed)
Reminder in epic °

## 2015-06-19 NOTE — Telephone Encounter (Signed)
Please call pt. He had FOUR simple adenomas removed. HIS capsule study shows multiple small bowel AVMS in his small bowel. AVMs ARE SMALL RED SPOTS ON HIS SMALL BOWEL THE BLEED AND MAKE HIM LOSE IRON & HAVE LOW BLOOD COUNT. HE SHOULD CONTINUE IRON PILLS THREE TIMES A DAY FOREVER. OPV IN 4 MOS WITH SLF E30 FEDA/WEIGHT LOSS.

## 2015-06-25 NOTE — Telephone Encounter (Signed)
Called and informed pt's nurse, Inez Catalina, at the [facility]. Also faxing this info to her at [phone].

## 2015-07-22 ENCOUNTER — Encounter: Payer: Self-pay | Admitting: Gastroenterology

## 2015-08-02 ENCOUNTER — Encounter (HOSPITAL_COMMUNITY)
Admission: RE | Admit: 2015-08-02 | Discharge: 2015-08-02 | Disposition: A | Discharge status: Home / Self Care | Payer: Medicare Other | Source: Skilled Nursing Facility | Attending: Internal Medicine | Admitting: Internal Medicine

## 2015-08-02 DIAGNOSIS — I739 Peripheral vascular disease, unspecified: Secondary | ICD-10-CM | POA: Insufficient documentation

## 2015-08-02 DIAGNOSIS — E119 Type 2 diabetes mellitus without complications: Secondary | ICD-10-CM | POA: Diagnosis not present

## 2015-08-02 LAB — CBC WITH DIFFERENTIAL/PLATELET
Basophils Absolute: 0.1 10*3/uL (ref 0.0–0.1)
Basophils Relative: 1 %
EOS ABS: 0.5 10*3/uL (ref 0.0–0.7)
Eosinophils Relative: 7 %
HCT: 31.5 % — ABNORMAL LOW (ref 39.0–52.0)
HEMOGLOBIN: 10 g/dL — AB (ref 13.0–17.0)
LYMPHS ABS: 2 10*3/uL (ref 0.7–4.0)
LYMPHS PCT: 30 %
MCH: 26.1 pg (ref 26.0–34.0)
MCHC: 31.7 g/dL (ref 30.0–36.0)
MCV: 82.2 fL (ref 78.0–100.0)
Monocytes Absolute: 0.6 10*3/uL (ref 0.1–1.0)
Monocytes Relative: 9 %
NEUTROS ABS: 3.7 10*3/uL (ref 1.7–7.7)
NEUTROS PCT: 53 %
Platelets: 338 10*3/uL (ref 150–400)
RBC: 3.83 MIL/uL — AB (ref 4.22–5.81)
RDW: 18.7 % — ABNORMAL HIGH (ref 11.5–15.5)
WBC: 6.9 10*3/uL (ref 4.0–10.5)

## 2015-08-02 LAB — BASIC METABOLIC PANEL
ANION GAP: 8 (ref 5–15)
BUN: 14 mg/dL (ref 6–20)
CHLORIDE: 109 mmol/L (ref 101–111)
CO2: 23 mmol/L (ref 22–32)
CREATININE: 0.75 mg/dL (ref 0.61–1.24)
Calcium: 9.3 mg/dL (ref 8.9–10.3)
GFR calc non Af Amer: >60 mL/min (ref >60)
Glucose, Bld: 103 mg/dL — ABNORMAL HIGH (ref 65–99)
POTASSIUM: 3.7 mmol/L (ref 3.5–5.1)
SODIUM: 140 mmol/L (ref 135–145)

## 2015-08-06 ENCOUNTER — Non-Acute Institutional Stay (SKILLED_NURSING_FACILITY): Payer: Medicare Other | Admitting: Internal Medicine

## 2015-08-06 DIAGNOSIS — I10 Essential (primary) hypertension: Secondary | ICD-10-CM | POA: Diagnosis not present

## 2015-08-06 DIAGNOSIS — Z8673 Personal history of transient ischemic attack (TIA), and cerebral infarction without residual deficits: Secondary | ICD-10-CM

## 2015-08-06 DIAGNOSIS — J42 Unspecified chronic bronchitis: Secondary | ICD-10-CM | POA: Diagnosis not present

## 2015-08-06 DIAGNOSIS — N179 Acute kidney failure, unspecified: Secondary | ICD-10-CM

## 2015-08-06 DIAGNOSIS — D500 Iron deficiency anemia secondary to blood loss (chronic): Secondary | ICD-10-CM | POA: Diagnosis not present

## 2015-08-06 NOTE — Progress Notes (Signed)
Patient ID: [name], male   DOB: Feb 20, 1935, 81 y.o.   MRN: [id number]                              FACILITY: [facility]                    LEVEL OF CARE:   SNF  This is a routine visit   Acute Visit                    CHIEF COMPLAINT: Medical management of chronic medical issues including anemia-history of colitis-hypertension-diabetes type 2-COPD-history of CVA.   F     HISTORY OF PRESENT ILLNESS:  [name] is a   pleasant 81 year old male with a complicated medical history-on admission to hospital  He was felt to have abdominal pain secondary to constipation and ischemic colitis.  Discharged here on a seven-day course of ciprofloxacin and Flagyl.  From this regard, everything has been stable.   He has not been complaining of pain.    He also was felt to have a hypertensive urgency when he arrived in the hospital with a blood pressure of 206/77-this is stabilized however ta one point was on possibly may have orthostatic hypotension but recent blood pressures have stabilized most recent we will1 32/43-138/55--I got 138/48 on exam today--I do note occasional pulse rates in the 50s actually got in the low 50s today he had been sleeping-will write an order to hold the labetalol if pulse is less than 50 although he appears to be asymptomatic--per nursing he does have pulses in the 90s at times as well--      Several months ago, the nurses here noted a left facial droop and sent him to the ER.  A CT scan of the head was negative.       The patient had a previous MRI of the brain on November 2nd.  This showed no acute or subacute infarction.  However, he does have stable remote infarcts involving the right cerebellum, left greater than right basal ganglia, brainstem, and right greater than left corona radiata.--He continues on Plavix  In regards to anemia --it was noted his hemoglobin had dropped down to 9.2 he did have an occult positive stool test-we did  start him on a proton pump inhibitor and he was seen by GI they did do a capsule study back in February that showed multiple small bowel AVMs-thought  this may cause low iron and low blood counts and recommendation was to continue iron pills indefinitely.  His hemoglobin has stabilized at around 10 on lab done earlier this week from this point he appears to have stabilized     Of note in the past Patient has had episodes of unresponsiveness however he quickly returned to baseline when  put to bed and his legs are elevated-he has been seen by cardiology --actually he has not has these episodes recently which is reassuring    There is some suspicion he does have a history of seizures     At one point patient has significant weight loss was down in the 140s however this has really turned around he is doing quite well recent weight 160.8 appears to be his new baseline again clinically he appears to be significantly improved from what he was experiencing a couple months ago   He has been started on Remeron  Lab done on January  7 was concerning for a creatinine of 1.93 which is significantly above his baseline which appears to be around 1-he did go to the ER and received IV fluids This is stabilized as well with a creatinine of 0.75 BUN of 14 on lab done earlier this week.  He does have a history of type 2 diabetes he is on Glucophage twice a day morning sugars appear to run largely from the 70s to the low 100s-mainly in the low to mid 100s  at bedtime.    At this point nursing does not report any acute issues they are encouraged by the fact that he appears to be doing considerably better now that was again when he first came here      Past Medical History  Diagnosis Date  . CAD (coronary artery disease)     multi vessel. s/p recent bare metal stenting of high grade proximal R CA stenosis. residual 70% : main in-stent restenosis, with distal filling of the left anterior descending from  graft flow. patent bypass graft, except for 100% occlusion of saphernous vein graft - 1st diagonal branch. 3-vessel CABG 2000.  Marland Kitchen CAD (coronary artery disease)     s/p of 95% L main artery stenosis, 4/03: cutting balloon PTCA of 80% in-stent restenosis, 7/04. preserved L ventriculr function  . Carotid bruit     bilateral. no sigificant distal abd atherosclerosis by recent cath.   Marland Kitchen COPD (chronic obstructive pulmonary disease) (HCC)     ongoing tobacco   . DM2 (diabetes mellitus, type 2) ([place])   . Dyslipidemia   . HTN (hypertension)   . Glaucoma   . CVA (cerebral infarction)   . Dysphagia   . Constipation       Past Surgical History  Procedure Laterality Date  . Cardiac catheterization      Family medical social history reviewed per history and physical on 03/12/2015  Current Outpatient Prescriptions on File Prior to Visit  Medication Sig Dispense Refill  .  Protonix 40 mg daily.  Iron 325 mg 3 times a day   90 tablet 5  .      .      . bisacodyl (DULCOLAX) 10 MG suppository Place 1 suppository (10 mg total) rectally daily as needed (no BM in 48 hours). 12 suppository 0  . Calcium Carbonate-Vitamin D (OS-CAL 500 + D PO) Take 1 tablet by mouth daily as needed.    . ciprofloxacin (CIPRO) 500 MG tablet Take 1 tablet (500 mg total) by mouth 2 (two) times daily. 4 tablet 0  . clopidogrel (PLAVIX) 75 MG tablet Take 75 mg by mouth daily.    Marland Kitchen docusate sodium (COLACE) 100 MG capsule Take 100 mg by mouth 2 (two) times daily.    . ferrous sulfate 325 (65 FE) MG tablet Take 325 mg by mouth daily with breakfast.    . Remeron 7.5 mg daily at bedtime      .      0  . labetalol (NORMODYNE) 200 MG tablet Take 1 tablet (200 mg total) by mouth 3 (three) times daily. 90 tablet 0  . lisinopril (PRINIVIL,ZESTRIL) 20 MG tablet Take 20 mg by mouth daily.      . Maltodextrin-Xanthan Gum (RESOURCE THICKENUP CLEAR) POWD Per instructions to create nectar thick liquids (Patient not taking: Reported on  03/15/2015) 5 Can 0  . metFORMIN (GLUCOPHAGE) 500 MG tablet Take 500 mg by mouth 2 (two) times daily with a meal.    .  6 tablet 0  . Multiple Vitamins-Minerals (MULTIVITAMIN PO) Take 1 tablet by mouth daily as needed.    . nicotine (NICODERM CQ - DOSED IN MG/24 HOURS) 14 mg/24hr patch Place 1 patch (14 mg total) onto the skin daily. 28 patch 0         . Omega-3 Fatty Acids (FISH OIL) 1000 MG CAPS Take 1,000 mg by mouth 3 (three) times daily.    . polyethylene glycol powder (MIRALAX) powder Take 17 g by mouth 2 (two) times daily. 850 g 0  .       . pravastatin (PRAVACHOL) 80 MG tablet Take 1 tablet (80 mg total) by mouth daily at 6 PM. 30 tablet 0  . senna (SENOKOT) 8.6 MG TABS tablet Take 2 tablets (17.2 mg total) by mouth daily. 120 each 0  . tamsulosin (FLOMAX) 0.4 MG CAPS capsule Take 0.4 mg by mouth daily after supper.       LABORATORY DATA  08/02/2015.  Sodium 140 potassium 3.7 BUN 14 creatinine 0.75.  WBC 6.9 hemoglobin 10.0 platelets 338.  05/15/2015.  Cholesterol 81-triglycerides 73--HDL 26--LDL 40  05/10/2015.  Liver function tests within normal limits  May 12 2015.  Sodium 144 potassium 4.7 BUN 31 creatinine 1.09.  May 10 2015.  Sodium 143 potassium 5.2 BUN 59 creatinine 1.93.  WBC 8.2 hemoglobin 10.2 platelets 299  04/07/2015.  Sodium 143 potassium 3.7 BUN 24 creatinine 0.9.  WBC 13.8 hemoglobin 8.1 platelets 444.  :  04/02/2015.  Sodium 139 potassium 4.1 BUN 15 creatinine 0.77.  03/31/2015.  WBC 11.8 hemoglobin 8.1 platelets 362.  Urine culture 03/30/2015-multiple species non-predominant       03/26/2015.  WBC 9.4 hemoglobin 9.2 platelets 367.  Sodium 137 potassium 4.5 BUN 23 creatinine 0.88.  November 18-----  hemoglobin was 11.4.  :     Sodium 138, BUN 13, creatinine 0.99.    White count 9, hemoglobin 11.4.    REVIEW OF SYSTEMS:    GENERAL:  The patient does not have any specific complaints--nursing feels he is a b more  energetic Eating better although at times  does have a spotty appetite Skin does not complain of rashes or itching.   HEENT:   No headache. Appears to be legally blind does have some sight in his right eye apparently  CHEST/RESPIRATORY:  No shortness of breath.   CARDIAC:  No chest pain.   GI:    no abdominal pain, diarrhea. GU-does not complaining of dysuria   NEUROLOGICAL:  He is not complaining of lateralizing weakness or dizziness-again apparently there is some thought he may have occasional seizure activity although has not had one in some time Psych-nursing does not report any issues continues to be pleasant and cooperative.  Marland Kitchen      PHYSICAL EXAMINATION:   Is afebrile pulse of 54 respirations 16 blood pressure taken manually 138/48-weight is 160.8 GENERAL APPEARANCE:  The patient is not in any distress lying comfortably in bed t  His skin is warm and dry Eyes-apparently he is legally blind he does apparently have some vision in his right eye but has significant visual --this appears to be baseline. Oropharynx clear mucous membranes moist               CHEST/RESPIRATORY:  Clear air entry bilaterally.  No labored breathing  CARDIOVASCULAR:   CARDIAC: .  Heart sounds 2/6 systolic murmur.  Marland Kitchen--Minimal lower extremity edema      GASTROINTESTINAL:   ABDOMEN: Soft nontender with positive bowel  sounds    Musculoskeletal-appears to move his extremities at baseline he ambulates in a wheelchairoften no lateralizing findings in his extremities         NEUROLOGICAL:    CRANIAL NERVES:   He does  have left facial weakness.  This is not new.   Otherwise appears to move his extremities at baseline Psych he appears grossly alert and oriented pleasant and appropriate         ASSESSMENT/PLAN:    Anemia- He has been seen by GI-hemoglobin appears this stabilized at around 10-she is now on aggressive iron supplementation 3 times a day-capsule study was done again by GI back in February he  did find AVMs or recommendation essentially to continue iron supplementation indefinitely-. He also continues on Protonix  #2 renal insufficiency this appears to have stabilized with a creatinine of 0.75 on lab done on April 1 asked his Lasix was discontinued previously but appears to have tolerated this well I do not see any increased edema I suspect weight gain is appetite related.  #3 history of ischemic colitis this has not reoccurred this is been stable again he is gaining weight does not complain of any abdominal pain.  #4-history hypertension-as noted above blood pressures appear to be stabilized he had been on numerous medications previously he is now only on lisinopril 20 mg --labetalol 100 mg 3 ttimes a day at this point will monitor.-Secondary to bradycardic readings at times-I suspect this is more  patient is sleeping or at rest-will write an order to hold labetalol if pulse is 50 or less--according nursing staff n he has borderline tachycardic readings at times--this will have to be monitored  #5 diabetes type 2- on Glucophage 500 twice a day-at bedtime snack will have to be encouraged also will update hemoglobin A1c --will reduce Glucophage to 500 mg every morning  History CVA apparently has had multiple small infarcts in the past-this appears stable he is on Plavix-also is on a statin-will update a lipid panel.  #7 history COPD this has been essentially asymptomatic at this point will monitor.  #8 weight loss asked again this has turned around fairly dramatically he is on Remeron this appears to be helping-she is also on supplementation including Magic cup-at this point continue to monitor.  [id number] of note greater than 40 minutes spent assessing patient-discussing his status with nursing staff-reviewing his chart and labs-and coordinating and formulating plan of care for numerous diagnoses-of note greater than 50% of time spent coordinating plan of care      .

## 2015-08-07 ENCOUNTER — Encounter (HOSPITAL_COMMUNITY)
Admission: RE | Admit: 2015-08-07 | Discharge: 2015-08-07 | Disposition: A | Discharge status: Home / Self Care | Payer: Medicare Other | Source: Skilled Nursing Facility | Attending: *Deleted | Admitting: *Deleted

## 2015-08-07 DIAGNOSIS — I739 Peripheral vascular disease, unspecified: Secondary | ICD-10-CM | POA: Diagnosis not present

## 2015-08-07 DIAGNOSIS — E119 Type 2 diabetes mellitus without complications: Secondary | ICD-10-CM | POA: Diagnosis not present

## 2015-08-07 LAB — LIPID PANEL
Cholesterol: 119 mg/dL (ref 0–200)
HDL: 41 mg/dL (ref >40)
LDL CALC: 61 mg/dL (ref 0–99)
Total CHOL/HDL Ratio: 2.9 RATIO
Triglycerides: 85 mg/dL (ref <150)
VLDL: 17 mg/dL (ref 0–40)

## 2015-08-07 LAB — HEPATIC FUNCTION PANEL
ALBUMIN: 3.5 g/dL (ref 3.5–5.0)
ALT: 30 U/L (ref 17–63)
AST: 25 U/L (ref 15–41)
Alkaline Phosphatase: 66 U/L (ref 38–126)
Total Bilirubin: 0.2 mg/dL — ABNORMAL LOW (ref 0.3–1.2)
Total Protein: 6.8 g/dL (ref 6.5–8.1)

## 2015-08-08 LAB — HEMOGLOBIN A1C
HEMOGLOBIN A1C: 5.9 % — AB (ref 4.8–5.6)
Mean Plasma Glucose: 123 mg/dL

## 2015-08-21 ENCOUNTER — Encounter: Payer: Self-pay | Admitting: Cardiovascular Disease

## 2015-09-03 ENCOUNTER — Encounter: Payer: Self-pay | Admitting: Gastroenterology

## 2015-09-03 ENCOUNTER — Ambulatory Visit (INDEPENDENT_AMBULATORY_CARE_PROVIDER_SITE_OTHER): Payer: Medicare Other | Admitting: Gastroenterology

## 2015-09-03 VITALS — BP 156/62 | HR 58 | Temp 98.3°F | Ht 69.0 in | Wt 169.0 lb

## 2015-09-03 DIAGNOSIS — K5901 Slow transit constipation: Secondary | ICD-10-CM

## 2015-09-03 DIAGNOSIS — D500 Iron deficiency anemia secondary to blood loss (chronic): Secondary | ICD-10-CM

## 2015-09-03 DIAGNOSIS — R1310 Dysphagia, unspecified: Secondary | ICD-10-CM

## 2015-09-03 NOTE — Assessment & Plan Note (Signed)
SYMPTOMS NOT IDEALLY CONTROLLED ON MIRALAX , SENNA, AND COLACE.   ADD LINZESS 30 MINS PRIOR TO BREAKFAST. IT MAY CAUSE EXPLOSIVE DIARRHEA. DRINK WATER TO KEEP YOUR URINE LIGHT YELLOW. FOLLOW A HIGH FIBER DIET. AVOID ITEMS THAT CAUSE BLOATING & GAS.  USE MIRALAX 1 SCOOP, COLACE, OR SENNA TWICE DAILY AS NEEDED TO HAVE BOWEL MOVEMENT 2-3 TIMES A WEEK. PLEASE CALL IN TWO WEEKS IF CONSTIPATION IS NOT BETTER OR IF LINZESS CAUSES DIARRHEA. FOLLOW UP IN 6 MOS.

## 2015-09-03 NOTE — Progress Notes (Signed)
ON RECALL  °

## 2015-09-03 NOTE — Progress Notes (Signed)
   Subjective:    Patient ID: [name], male    DOB: October 30, 1934, 81 y.o.   MRN: [id number] Neale Burly, MD   HPI DOING ALL RIGHT. BOWELS MOVE ONCE A WEEK WITH MIRALAX BID AND SENNA QD PLUS COLACE. NO BRBPR OR MELENA. APPETITE: PRETTY GOOD. PT DENIES FEVER, CHILLS, HEMATOCHEZIA, HEMATEMESIS, nausea, vomiting, melena, diarrhea, CHEST PAIN, SHORTNESS OF BREATH, CHANGE IN BOWEL IN HABITS, abdominal pain, problems swallowing, OR heartburn or indigestion.   Past Medical History  Diagnosis Date  . CAD (coronary artery disease)     cath 11/2007: 70% LM ISR, SVG-LAD OK, SVG-D1 100%, IMA-D2 OK, SVG-OM OK, 4-vessel CABG 2000.  Marland Kitchen CAD (coronary artery disease)     s/p of 95% L main artery stenosis, 4/03: cutting balloon PTCA of 80% in-stent restenosis, 7/04. preserved L ventriculr function  . Carotid bruit     bilateral. no sigificant distal abd atherosclerosis by recent cath.   Marland Kitchen COPD (chronic obstructive pulmonary disease) (HCC)     ongoing tobacco   . DM2 (diabetes mellitus, type 2) ([place])   . Dyslipidemia   . HTN (hypertension)   . Glaucoma   . CVA (cerebral infarction)   . Dysphagia   . Constipation    Past Surgical History  Procedure Laterality Date  . Cardiac catheterization    . Colonoscopy N/A 05/14/2015    POOR PREP  . Esophagogastroduodenoscopy N/A 05/14/2015    CHRONIC GASTRITIS  . Colonoscopy N/A 06/02/2015    SLF: 1. four colorectal polyps removed. no source for anemia identified. 2. the left colon is redundant 3. moderate sized internal hemorroids.   Freda Munro capsule study N/A 06/17/2015    Procedure: GIVENS CAPSULE STUDY;  Surgeon: Danie Binder, MD;  Location: [facility];  Service: Endoscopy;  Laterality: N/A;  0800   No Known Allergies  Reviewed  current outpatient prescriptions list Sep 03 2015.   Review of Systems PER HPI OTHERWISE ALL SYSTEMS ARE NEGATIVE.     Objective:   Physical Exam  Constitutional: He is oriented to person, place, and time. He  appears distressed.  HENT:  Head: Normocephalic and atraumatic.  Eyes: No scleral icterus.  Blind in left eye  Neck: Normal range of motion. Neck supple.  Cardiovascular: Normal rate, regular rhythm and normal heart sounds.   Pulmonary/Chest: Effort normal and breath sounds normal. No respiratory distress.  Abdominal: Soft. Bowel sounds are normal. He exhibits no distension. There is no tenderness.  EXAM LIMITED-PT IN wheelCHAIR  Musculoskeletal: He exhibits no edema.  Lymphadenopathy:    He has no cervical adenopathy.  Neurological: He is alert and oriented to person, place, and time.  NO  NEW FOCAL DEFICITS  Psychiatric: He has a normal mood and affect.  Vitals reviewed.     Assessment & Plan:

## 2015-09-03 NOTE — Patient Instructions (Addendum)
ADD LINZESS 30 MINS PRIOR TO BREAKFAST. IT MAY CAUSE EXPLOSIVE DIARRHEA.  DRINK WATER TO KEEP YOUR URINE LIGHT YELLOW.  FOLLOW A HIGH FIBER DIET. AVOID ITEMS THAT CAUSE BLOATING & GAS.   USE MIRALAX 1 SCOOP, COLACE, OR SENNA TWICE DAILY AS NEEDED TO HAVE BOWEL MOVEMENT 2-3 TIMES A WEEK.  PLEASE CALL IN TWO WEEKS IF CONSTIPATION IS NOT BETTER OR IF LINZESS CAUSES DIARRHEA.  CBC/FERRITIN IN 3 MOS  FOLLOW UP IN 6 MOS.

## 2015-09-03 NOTE — Assessment & Plan Note (Signed)
SYMPTOMS CONTROLLED/RESOLVED.  CONTINUE TO MONITOR SYMPTOMS. 

## 2015-09-03 NOTE — Assessment & Plan Note (Signed)
DUE TO SMALL BOWEL AVMs. NO WARNING SIGNS/SYMPTOMS. NO BRBPR OR MELENA.  CBC/FERRITIN IN 3 MOS FOLLOW UP IN 6 MOS.

## 2015-09-04 NOTE — Progress Notes (Signed)
cc'ed to pcp °

## 2015-09-10 ENCOUNTER — Non-Acute Institutional Stay (SKILLED_NURSING_FACILITY): Payer: Medicare Other | Admitting: Internal Medicine

## 2015-09-10 DIAGNOSIS — J42 Unspecified chronic bronchitis: Secondary | ICD-10-CM | POA: Diagnosis not present

## 2015-09-10 DIAGNOSIS — D500 Iron deficiency anemia secondary to blood loss (chronic): Secondary | ICD-10-CM | POA: Diagnosis not present

## 2015-09-10 DIAGNOSIS — I10 Essential (primary) hypertension: Secondary | ICD-10-CM

## 2015-09-10 DIAGNOSIS — E119 Type 2 diabetes mellitus without complications: Secondary | ICD-10-CM

## 2015-09-10 NOTE — Progress Notes (Signed)
Patient ID: [name], male   DOB: 01/19/1935, 81 y.o.   MRN: [id number]                               FACILITY: [facility]                    LEVEL OF CARE:   SNF  This is a routine visit   Acute Visit                    CHIEF COMPLAINT: Medical management of chronic medical issues including anemia-history of colitis-hypertension-diabetes type 2-COPD-history of CVA.   F     HISTORY OF PRESENT ILLNESS:  [name] is a   pleasant 81year-old male with a complicated medical history-on admission to hospital  He was felt to have abdominal pain secondary to constipation and ischemic colitis.  Discharged here on a seven-day course of ciprofloxacin and Flagyl.  From this regard, everything has been stable.   He has not been complaining of pain.    He also was felt to have a hypertensive urgency when he arrived in the hospital with a blood pressure of 206/77-this is stabilized however ta one point was on possibly may have orthostatic hypotension  Recent blood pressures appear to have stabilized however on exam tonight I did get 160/62-I see previous readings 149/60 appears at times he has elevated systolics we will have to get more readings here to see if this is a true trend-      Several months ago, the nurses here noted a left facial droop and sent him to the ER.  A CT scan of the head was negative.       The patient had a previous MRI of the brain on November 2nd.  This showed no acute or subacute infarction.  However, he does have stable remote infarcts involving the right cerebellum, left greater than right basal ganglia, brainstem, and right greater than left corona radiata.--He continues on Plavix  In regards to anemia --it was noted his hemoglobin had dropped down to 9.2 he did have an occult positive stool test-we did start him on a proton pump inhibitor and he was seen by GI they did do a capsule study back in February that showed multiple small bowel  AVMs-thought  this may cause low iron and low blood counts and recommendation was to continue iron pills indefinitely.  His hemoglobin has stabilized at around 10 on lab done last month in April from this point he appears to have stabilized  He did see gastroenterology on May 3 thought to be doing well he had complain of constipation and Linzess was added--as well as MiraLAX when necessary-apparently this has been tolerated well.  In the past he did have episodes of unresponsiveness however he quickly returned to baseline when  put to bed and his legs are elevated-he has been seen by cardiology --actually he has not has these episodes recently which is reassuring    There is some suspicion he does have a history of seizures     At one point patient has significant weight loss was down in the 140s however this has really turned around he is doing quite well recent weight  169.6-he is on Remeron I suspect we can discontinue this and monitor.      Lab done on January 7 was concerning for a creatinine of 1.93 which  is significantly above his baseline which appears to be around 1-he did go to the ER and received IV fluids This is stabilized as well with a creatinine of 0.75 BUN of 14 on lab done last month we will update this he is eating and drinking well  He does have a history of type 2 diabetes he was on Glucophage but this has been discontinued secondary to stability a.m. blood sugars appear to be a low 100s occasionally he's around 200 later in the day this appears to be stable--will update hemoglobin A1c was 5.9 last month.  Currently has no complaints appears to be doing quite well again we will have to monitor his blood pressure.             Past Medical History  Diagnosis Date  . CAD (coronary artery disease)     multi vessel. s/p recent bare metal stenting of high grade proximal R CA stenosis. residual 70% : Miller in-stent restenosis, with distal filling of the left  anterior descending from graft flow. patent bypass graft, except for 100% occlusion of saphernous vein graft - 1st diagonal branch. 3-vessel CABG 2000.  Marland Kitchen CAD (coronary artery disease)     s/p of 95% L Miller artery stenosis, 4/03: cutting balloon PTCA of 80% in-stent restenosis, 7/04. preserved L ventriculr function  . Carotid bruit     bilateral. no sigificant distal abd atherosclerosis by recent cath.   Marland Kitchen COPD (chronic obstructive pulmonary disease) (HCC)     ongoing tobacco   . DM2 (diabetes mellitus, type 2) ([place])   . Dyslipidemia   . HTN (hypertension)   . Glaucoma   . CVA (cerebral infarction)   . Dysphagia   . Constipation       Past Surgical History  Procedure Laterality Date  . Cardiac catheterization      Family medical social history reviewed per history and physical on 03/12/2015  Current Outpatient Prescriptions on File Prior to Visit  Medication Sig Dispense Refill  .  Protonix 40 mg daily.  Iron 325 mg 3 times a day   90 tablet 5  .      .      . bisacodyl (DULCOLAX) 10 MG suppository Place 1 suppository (10 mg total) rectally daily as needed (no BM in 48 hours). 12 suppository 0  . Calcium Carbonate-Vitamin D (OS-CAL 500 + D PO) Take 1 tablet by mouth daily as needed.    . ciprofloxacin (CIPRO) 500 MG tablet Take 1 tablet (500 mg total) by mouth 2 (two) times daily. 4 tablet 0  . clopidogrel (PLAVIX) 75 MG tablet Take 75 mg by mouth daily.    Marland Kitchen docusate sodium (COLACE) 100 MG capsule Take 100 mg by mouth 2 (two) times daily.    . ferrous sulfate 325 (65 FE) MG tablet Take 325 mg by mouth daily with breakfast.    . Remeron 7.5 mg daily at bedtime      .      0  . labetalol (NORMODYNE) 200 MG tablet Take 1 tablet (200 mg total) by mouth 3 (three) times daily. 90 tablet 0  . lisinopril (PRINIVIL,ZESTRIL) 20 MG tablet Take 20 mg by mouth daily.      . Maltodextrin-Xanthan Gum (RESOURCE THICKENUP CLEAR) POWD Per instructions to create nectar thick liquids (Patient  not taking: Reported on 03/15/2015) 5 Can 0  . metFORMIN (GLUCOPHAGE) 500 MG tablet Take 500 mg by mouth 2 (two) times daily with a meal.    .  6 tablet 0  . Multiple Vitamins-Minerals (MULTIVITAMIN PO) Take 1 tablet by mouth daily as needed.    . nicotine (NICODERM CQ - DOSED IN MG/24 HOURS) 14 mg/24hr patch Place 1 patch (14 mg total) onto the skin daily. 28 patch 0         . Omega-3 Fatty Acids (FISH OIL) 1000 MG CAPS Take 1,000 mg by mouth 3 (three) times daily.    . polyethylene glycol powder (MIRALAX) powder Take 17 g by mouth 2 (two) times daily. 850 g 0  .       . pravastatin (PRAVACHOL) 80 MG tablet Take 1 tablet (80 mg total) by mouth daily at 6 PM. 30 tablet 0  . senna (SENOKOT) 8.6 MG TABS tablet Take 2 tablets (17.2 mg total) by mouth daily. 120 each 0  . tamsulosin (FLOMAX) 0.4 MG CAPS capsule Take 0.4 mg by mouth daily after supper.     Of note she has been started on Linzess 72 mg daily.  \Is also on MiraLAX twice a day when necessary   LABORATORY DATA  08/09/2015.  Cholesterol 119-HDL 41-LDL 61-triglycerides 85.  08/07/2015.  Liver function tests essentially within normal limits bilirubin slightly low at 0.2 08/02/2015.  Sodium 140 potassium 3.7 BUN 14 creatinine 0.75.  WBC 6.9 hemoglobin 10.0 platelets 338.  05/15/2015.  Cholesterol 81-triglycerides 73--HDL 26--LDL 40  05/10/2015.  Liver function tests within normal limits  May 12 2015.  Sodium 144 potassium 4.7 BUN 31 creatinine 1.09.  May 10 2015.  Sodium 143 potassium 5.2 BUN 59 creatinine 1.93.  WBC 8.2 hemoglobin 10.2 platelets 299  04/07/2015.  Sodium 143 potassium 3.7 BUN 24 creatinine 0.9.  WBC 13.8 hemoglobin 8.1 platelets 444.  :  04/02/2015.  Sodium 139 potassium 4.1 BUN 15 creatinine 0.77.  03/31/2015.  WBC 11.8 hemoglobin 8.1 platelets 362.  Urine culture 03/30/2015-multiple species non-predominant       03/26/2015.  WBC 9.4 hemoglobin 9.2 platelets  367.  Sodium 137 potassium 4.5 BUN 23 creatinine 0.88.  November 18-----  hemoglobin was 11.4.  :     Sodium 138, BUN 13, creatinine 0.99.    White count 9, hemoglobin 11.4.    REVIEW OF SYSTEMS:    GENERAL:  The patient does not have any specific complaints--per nursing doing very well has gained weight Skin does not complain of rashes or itching.   HEENT:   No headache. Appears to be legally blind does have some sight in his right eye apparently  CHEST/RESPIRATORY:  No shortness of breath.   CARDIAC:  No chest pain.   GI:    no abdominal pain, diarrhea. GU-does not complaining of dysuria   NEUROLOGICAL:  He is not complaining of lateralizing weakness or dizziness-again apparently there is some thought he may have occasional seizure activity although has not had one in some time Psych-nursing does not report any issues continues to be pleasant and cooperative.  Marland Kitchen      PHYSICAL EXAMINATION:   Is afebrile pulse 64 respirations 18 blood pressure 160/62 weight continues to go up at 169.6 GENERAL APPEARANCE:  The patient is not in any distress lying comfortably in bed t  His skin is warm and dry Eyes-apparently he is legally blind he does apparently have some vision in his right eye but has significant visual --this appears to be baseline. Oropharynx clear mucous membranes moist               CHEST/RESPIRATORY:  Clear air entry bilaterally.  No labored breathing  CARDIOVASCULAR:   CARDIAC: .  Heart sounds 2/6 systolic murmur.  Marland Kitchen--Minimal lower extremity edema      GASTROINTESTINAL:   ABDOMEN: Soft nontender with positive bowel sounds    Musculoskeletal-appears to move his extremities at baseline he ambulates in a wheelchairoften no lateralizing findings in his extremities         NEUROLOGICAL:    CRANIAL NERVES:   He does  have left facial weakness.  This is not new.   Otherwise appears to move his extremities at baseline Psych he appears grossly alert and oriented pleasant  and appropriate         ASSESSMENT/PLAN:    Anemia- He has been seen by GI-hemoglobin appears this stabilized at around 10-she is now on aggressive iron supplementation 3 times a day-capsule study was done again by GI back in February he did find AVMs or recommendation essentially to continue iron supplementation indefinitely-. He also continues on Protonix -- We will update a CBC   #2 renal insufficiency this appears to have stabilized creatinine in April was 0.75 BUN of 14 we will update this d.  #3 history of ischemic colitis this has not reoccurred this is been stable again he is gaining weight does not complain of any abdominal pain.  #4-history hypertension-as noted above --he is now only on lisinopril 20 mg --labetalol 100 mg 3 ttimes a day --it appears some systolics are somewhat elevated although certainly not classified as malignant hypertension-will order blood pressure checks every shift 4 log for review later this week I suspect we may need to go up on the lisinopril but would like to see if this is a true baseline   #5 diabetes type 2- this appears stable despite being off Glucophage will update a hemoglobin A1c  History CVA apparently has had multiple small infarcts in the past-this appears stable he is on Plavix  #7 history COPD this has been essentially asymptomatic at this point will monitor.  #8 weight loss this does not really appear to be an issue anymore he is significantly gaining weight I suspect this is appetite related his edema is minimal he is doing well clinically-we'll discontinue the Remeron and continue to monitor  #9 constipation patient did complain of this to gastroenterologist recently-although nursing staff reports she is having pretty regular bowel movements-he is now on linens S once a day also MiraLAX twice a day when necessary continues on correlates 100 mg twice a day and senna daily this appears to be stable at this point will  monitor.  #10 history hyperlipidemia he is on a statin--closer panel on April 8 show stability with cholesterol 119 triglycerides 85 HDL 41 LDL 61- liver function tests  on 08/07/2015 appear to be largely within normal limits  [id number] of note greater than 35 minutes spent assessing patient-discussing his status with nursing staff-reviewing his chart and labs-and coordinating and formulating plan of care for numerous diagnoses-of note greater than 50% of time spent coordinating plan of care      .

## 2015-09-11 ENCOUNTER — Encounter (HOSPITAL_COMMUNITY)
Admission: AD | Admit: 2015-09-11 | Discharge: 2015-09-11 | Disposition: A | Discharge status: Home / Self Care | Payer: Medicare Other | Source: Skilled Nursing Facility | Attending: Internal Medicine | Admitting: Internal Medicine

## 2015-09-11 DIAGNOSIS — I739 Peripheral vascular disease, unspecified: Secondary | ICD-10-CM | POA: Insufficient documentation

## 2015-09-11 DIAGNOSIS — E119 Type 2 diabetes mellitus without complications: Secondary | ICD-10-CM | POA: Insufficient documentation

## 2015-09-11 LAB — COMPREHENSIVE METABOLIC PANEL
ALBUMIN: 3.4 g/dL — AB (ref 3.5–5.0)
ALT: 17 U/L (ref 17–63)
ANION GAP: 8 (ref 5–15)
AST: 18 U/L (ref 15–41)
Alkaline Phosphatase: 62 U/L (ref 38–126)
BILIRUBIN TOTAL: 0.2 mg/dL — AB (ref 0.3–1.2)
BUN: 14 mg/dL (ref 6–20)
CHLORIDE: 108 mmol/L (ref 101–111)
CO2: 25 mmol/L (ref 22–32)
Calcium: 9.2 mg/dL (ref 8.9–10.3)
Creatinine, Ser: 0.86 mg/dL (ref 0.61–1.24)
GFR calc Af Amer: >60 mL/min (ref >60)
GFR calc non Af Amer: >60 mL/min (ref >60)
GLUCOSE: 114 mg/dL — AB (ref 65–99)
POTASSIUM: 4.1 mmol/L (ref 3.5–5.1)
SODIUM: 141 mmol/L (ref 135–145)
Total Protein: 7.3 g/dL (ref 6.5–8.1)

## 2015-09-11 LAB — CBC
HEMATOCRIT: 34.2 % — AB (ref 39.0–52.0)
Hemoglobin: 11 g/dL — ABNORMAL LOW (ref 13.0–17.0)
MCH: 26.8 pg (ref 26.0–34.0)
MCHC: 32.2 g/dL (ref 30.0–36.0)
MCV: 83.4 fL (ref 78.0–100.0)
PLATELETS: 174 10*3/uL (ref 150–400)
RBC: 4.1 MIL/uL — ABNORMAL LOW (ref 4.22–5.81)
RDW: 18.7 % — AB (ref 11.5–15.5)
WBC: 6 10*3/uL (ref 4.0–10.5)

## 2015-09-12 ENCOUNTER — Encounter: Payer: Self-pay | Admitting: Internal Medicine

## 2015-09-12 ENCOUNTER — Non-Acute Institutional Stay (SKILLED_NURSING_FACILITY): Payer: Medicare Other | Admitting: Internal Medicine

## 2015-09-12 DIAGNOSIS — I10 Essential (primary) hypertension: Secondary | ICD-10-CM | POA: Diagnosis not present

## 2015-09-12 LAB — HEMOGLOBIN A1C
Hgb A1c MFr Bld: 6.7 % — ABNORMAL HIGH (ref 4.8–5.6)
Mean Plasma Glucose: 146 mg/dL

## 2015-09-12 NOTE — Progress Notes (Signed)
Location:  [facility] Room Number: 104/W Place of Service:  SNF (31) Provider:  Tillman Sers, MD  Patient Care Team: Neale Burly, MD as PCP - General (Internal Medicine) Danie Binder, MD as Consulting Physician (Gastroenterology)  Extended Emergency Contact Information Primary Emergency Contact: Dorathy Kinsman [place] of [place] Phone: [phone] Relation: Daughter Secondary Emergency Contact: [place] of [place] Phone: [phone] Relation: Daughter  Goals of care: Advanced Directive information Advanced Directives 09/12/2015  Does patient have an advance directive? Yes  Type of Advance Directive Out of facility DNR (pink MOST or yellow form)  Does patient want to make changes to advanced directive? No - Patient declined  Copy of advanced directive(s) in chart? Yes     Chief Complaint  Patient presents with  . Acute Visit    For B/P recheck    HPI:  Pt is a 81 y.o. male seen today for an acute visit for Recheck of his blood pressures-I did notice on routine visit earlier this week blood pressures at times are somewhat elevated I got systolic in the 027X-A saw 128 systolic as well as-he does have a history of hypertension is on labetalol 100 mg 3 times a day as well as lisinopril 20 mg a day.  I did take a reading tonight and got 164/50 this was confirmed by machine-he does not complain of any headache dizziness or increased weakness.  He does have a history of left facial weakness which is chronic.     Past Medical History  Diagnosis Date  . CAD (coronary artery disease)     cath 11/2007: 70% LM ISR, SVG-LAD OK, SVG-D1 100%, IMA-D2 OK, SVG-OM OK, 4-vessel CABG 2000.  Marland Kitchen CAD (coronary artery disease)     s/p of 95% L main artery stenosis, 4/03: cutting balloon PTCA of 80% in-stent restenosis, 7/04. preserved L ventriculr function  . Carotid bruit     bilateral. no sigificant distal  abd atherosclerosis by recent cath.   Marland Kitchen COPD (chronic obstructive pulmonary disease) (HCC)     ongoing tobacco   . DM2 (diabetes mellitus, type 2) ([place])   . Dyslipidemia   . HTN (hypertension)   . Glaucoma   . CVA (cerebral infarction)   . Dysphagia   . Constipation    Past Surgical History  Procedure Laterality Date  . Cardiac catheterization    . Colonoscopy N/A 05/14/2015    POOR PREP  . Esophagogastroduodenoscopy N/A 05/14/2015    CHRONIC GASTRITIS  . Colonoscopy N/A 06/02/2015    SLF: 1. four colorectal polyps removed. no source for anemia identified. 2. the left colon is redundant 3. moderate sized internal hemorroids.   Freda Munro capsule study N/A 06/17/2015    Procedure: GIVENS CAPSULE STUDY;  Surgeon: Danie Binder, MD;  Location: [facility];  Service: Endoscopy;  Laterality: N/A;  0800    No Known Allergies  Current Outpatient Prescriptions on File Prior to Visit  Medication Sig Dispense Refill  . clopidogrel (PLAVIX) 75 MG tablet Take 75 mg by mouth daily.    Marland Kitchen docusate sodium (COLACE) 100 MG capsule Take 100 mg by mouth 2 (two) times daily.    . ferrous sulfate 325 (65 FE) MG tablet Take 1 tablet (325 mg total) by mouth 3 (three) times daily with meals. 90 tablet 11  . labetalol (NORMODYNE) 200 MG tablet Take 1 tablet (200 mg total) by mouth 3 (three) times daily. [place]  tablet 0  . Omega-3 Fatty Acids (FISH OIL) 1000 MG CAPS Take 1,000 mg by mouth 3 (three) times daily.    . pantoprazole (PROTONIX) 40 MG tablet Take 40 mg by mouth daily.    . polyethylene glycol powder (MIRALAX) powder Take 17 g by mouth 2 (two) times daily. 850 g 0  . senna (SENOKOT) 8.6 MG TABS tablet Take 2 tablets (17.2 mg total) by mouth daily. 120 each 0  . tamsulosin (FLOMAX) 0.4 MG CAPS capsule Take 0.4 mg by mouth daily after supper.     No current facility-administered medications on file prior to visit.  Of note he is also on lisinopril 20 mg a day   Review of Systems   In general no  complaints of fever or chills.  Eyes  does have a history of blindness.  Respirate is not clinic shortness of breath or cough.  .  Cardiac no chest pain.  GI does not complain of any nausea vomiting diarrhea constipation still is eating quite well.  Neurologic is not complaining of dizziness or headache does have a history of left facial weakness.  Psych no behaviors have been noted he is pleasant cooperative is doing quite well in this respect  Immunization History  Administered Date(s) Administered  . Influenza,inj,Quad PF,36+ Mos 03/07/2015   Pertinent  Health Maintenance Due  Topic Date Due  . FOOT EXAM  09/11/2016 (Originally 08/31/1944)  . OPHTHALMOLOGY EXAM  09/11/2016 (Originally 08/31/1944)  . URINE MICROALBUMIN  09/11/2016 (Originally 08/31/1944)  . PNA vac Low Risk Adult (1 of 2 - PCV13) 09/11/2016 (Originally 08/30/2001)  . INFLUENZA VACCINE  12/02/2015  . HEMOGLOBIN A1C  02/06/2016   No flowsheet data found. Functional Status Survey:      Physical Exam   Is afebrile pulses 70 respirations 17 and blood pressure 164/50.  General this is a pleasant LE male in no distress resting comfortably in bed.  His skin is warm and dry.  Oropharynx clear mucous membranes moist.  Chest is clear to auscultation there is no labored breathing.  Heart is regular rate and rhythm without murmur gallop or rub.  Abdomen is soft nontender positive bowel sounds.  Neurologic is grossly at baseline he does have left facial weakness which is not new is able to move all his extremities 4.    Labs reviewed:  Recent Labs  05/21/15 0700 08/02/15 0600 09/11/15 0715  NA 142 140 141  K 3.5 3.7 4.1  CL 107 109 108  CO2 '26 23 25  '$ GLUCOSE 91 103* 114*  BUN '13 14 14  '$ CREATININE 0.73 0.75 0.86  CALCIUM 9.3 9.3 9.2    Recent Labs  05/10/15 0722 08/07/15 0715 09/11/15 0715  AST '20 25 18  '$ ALT '21 30 17  '$ ALKPHOS 67 66 62  BILITOT 0.4 0.2* 0.2*  PROT 8.0 6.8 7.3  ALBUMIN  3.9 3.5 3.4*    Recent Labs  05/10/15 1130  05/21/15 0700 08/02/15 0600 09/11/15 0715  WBC 7.3  < > 7.0 6.9 6.0  NEUTROABS 4.5  --  4.2 3.7  --   HGB 10.6*  < > 9.1* 10.0* 11.0*  HCT 32.7*  < > 27.3* 31.5* 34.2*  MCV 86.5  < > 85.6 82.2 83.4  PLT 318  < > 344 338 174  < > = values in this interval not displayed. TSH-1.38 Lab Results  Component Value Date   HGBA1C 6.7* 09/11/2015   Lab Results  Component Value Date   CHOL 119  08/07/2015   HDL 41 08/07/2015   LDLCALC 61 08/07/2015   TRIG 85 08/07/2015   CHOLHDL 2.9 08/07/2015    Significant Diagnostic Results in last 30 days:  No results found.  Assessment/Plan  Hypertension-it appears his systolics are somewhat consistently elevated we'll increase his lisinopril to 30 mg a day and monitor continue to monitor every shift I suspect we will be reassessing this next week and possibly may titrate his medicine up more-clinically he appears to be stable no complaints of headache dizziness or chest pain.  [place], [place], [place]

## 2015-09-13 ENCOUNTER — Encounter: Payer: Self-pay | Admitting: Internal Medicine

## 2015-09-14 ENCOUNTER — Encounter: Payer: Self-pay | Admitting: Internal Medicine

## 2015-10-08 ENCOUNTER — Ambulatory Visit: Payer: Medicare Other | Admitting: Cardiovascular Disease

## 2015-10-16 ENCOUNTER — Other Ambulatory Visit (HOSPITAL_COMMUNITY)
Admission: AD | Admit: 2015-10-16 | Discharge: 2015-10-16 | Disposition: A | Discharge status: Home / Self Care | Payer: Medicare Other | Source: Other Acute Inpatient Hospital | Attending: Internal Medicine | Admitting: Internal Medicine

## 2015-10-16 ENCOUNTER — Ambulatory Visit: Payer: Medicare Other | Admitting: Cardiovascular Disease

## 2015-10-16 DIAGNOSIS — N390 Urinary tract infection, site not specified: Secondary | ICD-10-CM | POA: Insufficient documentation

## 2015-10-16 LAB — URINALYSIS, ROUTINE W REFLEX MICROSCOPIC
BILIRUBIN URINE: NEGATIVE
Glucose, UA: NEGATIVE mg/dL
KETONES UR: NEGATIVE mg/dL
Leukocytes, UA: NEGATIVE
Nitrite: NEGATIVE
SPECIFIC GRAVITY, URINE: 1.025 (ref 1.005–1.030)
pH: 6 (ref 5.0–8.0)

## 2015-10-16 LAB — URINE MICROSCOPIC-ADD ON

## 2015-10-18 LAB — URINE CULTURE

## 2015-10-22 ENCOUNTER — Non-Acute Institutional Stay (SKILLED_NURSING_FACILITY): Payer: Medicare Other | Admitting: Internal Medicine

## 2015-10-22 ENCOUNTER — Encounter: Payer: Self-pay | Admitting: Internal Medicine

## 2015-10-22 DIAGNOSIS — I10 Essential (primary) hypertension: Secondary | ICD-10-CM

## 2015-10-22 DIAGNOSIS — E1151 Type 2 diabetes mellitus with diabetic peripheral angiopathy without gangrene: Secondary | ICD-10-CM | POA: Diagnosis not present

## 2015-10-22 DIAGNOSIS — E113593 Type 2 diabetes mellitus with proliferative diabetic retinopathy without macular edema, bilateral: Secondary | ICD-10-CM | POA: Diagnosis not present

## 2015-10-22 DIAGNOSIS — E785 Hyperlipidemia, unspecified: Secondary | ICD-10-CM | POA: Diagnosis not present

## 2015-10-22 DIAGNOSIS — D649 Anemia, unspecified: Secondary | ICD-10-CM | POA: Insufficient documentation

## 2015-10-22 NOTE — Assessment & Plan Note (Signed)
No bleeding dyscrasias but CBC needs to be monitored to rule out progression of anemia

## 2015-10-22 NOTE — Progress Notes (Signed)
Patient ID: [name], male   DOB: 1934/06/28, 81 y.o.   MRN: [id number]  .  This is a nursing facility follow up of chronic medical diagnoses Interim medical record and care since last [facility] visit was updated with review of diagnostic studies and change in clinical status since last visit were documented.  HPI: The patient is 81 year old male with multiple comorbidities including COPD, hypertension , coronary artery disease, peripheral vascular disease, anemia, history of TIA, and diabetes. He is on generic Plavix for the peripheral vascular disease and TIA. He is on iron supplement because of chronic anemia. He is also on high-dose statin, specifically pravastatin 80 mg daily. He is on beta blocker as well as an ACE- inhibitor.  He is legally blind related to diabetic retinopathy, cataracts, and glaucoma. Most recent labs were 09/11/15. He had anemia which was improved compared to April this year. His hematocrit had risen from 31.5 up to 34.2. A1c was at goal at 6.7% on 09/11/15 on no diabetic medication. Liver function tests were normal despite the high-dose statin. His lipids were at goal on 08/07/15.   Comprehensive review of systems: Negative except for intermittent constipation and edema. Constitutional: No fever,significant weight change, fatigue  Eyes: No redness, discharge, pain,new vision change ENT/mouth: No nasal congestion,  purulent discharge, earache,change in hearing ,sore throat  Cardiovascular: No chest pain, palpitations,paroxysmal nocturnal dyspnea, claudication  Respiratory: No cough, sputum production,hemoptysis, DOE , significant snoring,apnea   Gastrointestinal: No heartburn,dysphagia,abdominal pain, nausea / vomiting,rectal bleeding, melena Genitourinary: No dysuria,hematuria, pyuria,  incontinence, nocturia Musculoskeletal: No joint stiffness, joint swelling, weakness,pain Dermatologic: No rash, pruritus, change in appearance of skin Neurologic:  No dizziness,headache,syncope, seizures, numbness , tingling Psychiatric: No significant anxiety , depression, insomnia, anorexia Endocrine: No change in hair/skin/ nails, excessive thirst, excessive hunger, excessive urination  Hematologic/lymphatic: No significant bruising, lymphadenopathy,abnormal bleeding Allergy/immunology: No itchy/ watery eyes, significant sneezing, urticaria, angioedema  Physical exam:  Pertinent or positive findings: Pattern alopecia is present. Arcus senilis is present. There is marked ptosis on the left. The left eye is opaque and light blue He is completely edentulous. He has a left carotid bruit. He has minor rhonchi at the bases. Breath sounds in upper lobes are slightly decreased. Pedal pulses are decreased. No significant edema is present. Decreased grip in the left hand. There is decreased range of motion of the left upper extremity. Able to raise left upper extremity to the shoulder only. The deep tendon reflex @ left knee is reduced. Clubbing of the nailbeds present General appearance:Adequately nourished; no acute distress , increased work of breathing is present.   Lymphatic: No lymphadenopathy about the head, neck, axilla . Eyes: No conjunctival inflammation or lid edema is present. There is no scleral icterus. Ears:  External ear exam shows no significant lesions or deformities.   Nose:  External nasal examination shows no deformity or inflammation. Nasal mucosa are pink and moist without lesions ,exudates Oral exam: lips and gums are healthy appearing.There is no oropharyngeal erythema or exudate . Neck:  No thyromegaly, masses, tenderness noted.    Heart:  Normal rate and regular rhythm. S1 and S2 normal without gallop, murmur, click, rub .  Abdomen:Bowel sounds are normal. Abdomen is soft and nontender with no organomegaly, hernias,masses. GU: deferred. Extremities:  No cyanosis, clubbing,edema  Neurologic exam : Oriented  3 Balance,Rhomberg,finger to nose testing could not be completed due to clinical state Skin: Warm & dry w/o tenting. No significant lesions or rash.  See summary under each active problem in the Problem List with associated updated therapeutic plan

## 2015-10-22 NOTE — Patient Instructions (Signed)
New orders for Matrix entry. CBC and differential  BMET  CK

## 2015-10-22 NOTE — Assessment & Plan Note (Signed)
Liver function tests have been normal Fasting lipids will be due in October 2017 Because of advanced age, high-dose statin and polypharmacy; CK monitor is appropriate

## 2015-10-22 NOTE — Assessment & Plan Note (Signed)
Repeat A1c mid-August 2017 Continued sugar restriction No medications

## 2015-10-22 NOTE — Assessment & Plan Note (Signed)
Adequate control No change in medications BMET

## 2015-10-23 ENCOUNTER — Encounter (HOSPITAL_COMMUNITY)
Admission: RE | Admit: 2015-10-23 | Discharge: 2015-10-23 | Disposition: A | Discharge status: Home / Self Care | Payer: Medicare Other | Source: Skilled Nursing Facility | Attending: Internal Medicine | Admitting: Internal Medicine

## 2015-10-23 DIAGNOSIS — K5901 Slow transit constipation: Secondary | ICD-10-CM | POA: Insufficient documentation

## 2015-10-23 DIAGNOSIS — D500 Iron deficiency anemia secondary to blood loss (chronic): Secondary | ICD-10-CM | POA: Insufficient documentation

## 2015-10-23 DIAGNOSIS — I10 Essential (primary) hypertension: Secondary | ICD-10-CM | POA: Diagnosis not present

## 2015-10-23 DIAGNOSIS — F5089 Other specified eating disorder: Secondary | ICD-10-CM | POA: Insufficient documentation

## 2015-10-23 DIAGNOSIS — K559 Vascular disorder of intestine, unspecified: Secondary | ICD-10-CM | POA: Insufficient documentation

## 2015-10-23 DIAGNOSIS — N4289 Other specified disorders of prostate: Secondary | ICD-10-CM | POA: Insufficient documentation

## 2015-10-23 LAB — BASIC METABOLIC PANEL
ANION GAP: 6 (ref 5–15)
BUN: 11 mg/dL (ref 6–20)
CHLORIDE: 106 mmol/L (ref 101–111)
CO2: 25 mmol/L (ref 22–32)
Calcium: 8.7 mg/dL — ABNORMAL LOW (ref 8.9–10.3)
Creatinine, Ser: 0.78 mg/dL (ref 0.61–1.24)
GFR calc Af Amer: >60 mL/min (ref >60)
GLUCOSE: 130 mg/dL — AB (ref 65–99)
POTASSIUM: 3.7 mmol/L (ref 3.5–5.1)
SODIUM: 137 mmol/L (ref 135–145)

## 2015-10-23 LAB — CBC WITH DIFFERENTIAL/PLATELET
BASOS ABS: 0.1 10*3/uL (ref 0.0–0.1)
Basophils Relative: 1 %
EOS PCT: 7 %
Eosinophils Absolute: 0.5 10*3/uL (ref 0.0–0.7)
HCT: 34.3 % — ABNORMAL LOW (ref 39.0–52.0)
HEMOGLOBIN: 11.3 g/dL — AB (ref 13.0–17.0)
LYMPHS ABS: 2.5 10*3/uL (ref 0.7–4.0)
LYMPHS PCT: 37 %
MCH: 27.7 pg (ref 26.0–34.0)
MCHC: 32.9 g/dL (ref 30.0–36.0)
MCV: 84.1 fL (ref 78.0–100.0)
Monocytes Absolute: 0.6 10*3/uL (ref 0.1–1.0)
Monocytes Relative: 8 %
NEUTROS PCT: 48 %
Neutro Abs: 3.2 10*3/uL (ref 1.7–7.7)
PLATELETS: 259 10*3/uL (ref 150–400)
RBC: 4.08 MIL/uL — AB (ref 4.22–5.81)
RDW: 16.1 % — ABNORMAL HIGH (ref 11.5–15.5)
WBC: 6.8 10*3/uL (ref 4.0–10.5)

## 2015-10-23 LAB — CK: CK TOTAL: 101 U/L (ref 49–397)

## 2015-10-31 ENCOUNTER — Encounter (HOSPITAL_COMMUNITY)
Admission: RE | Admit: 2015-10-31 | Discharge: 2015-10-31 | Disposition: A | Discharge status: Home / Self Care | Payer: Medicare Other | Source: Skilled Nursing Facility | Attending: Internal Medicine | Admitting: Internal Medicine

## 2015-10-31 ENCOUNTER — Encounter: Payer: Self-pay | Admitting: Internal Medicine

## 2015-10-31 ENCOUNTER — Non-Acute Institutional Stay: Payer: Medicare Other | Admitting: Internal Medicine

## 2015-10-31 DIAGNOSIS — K559 Vascular disorder of intestine, unspecified: Secondary | ICD-10-CM | POA: Diagnosis not present

## 2015-10-31 DIAGNOSIS — I10 Essential (primary) hypertension: Secondary | ICD-10-CM | POA: Diagnosis not present

## 2015-10-31 DIAGNOSIS — R001 Bradycardia, unspecified: Secondary | ICD-10-CM | POA: Diagnosis not present

## 2015-10-31 DIAGNOSIS — D500 Iron deficiency anemia secondary to blood loss (chronic): Secondary | ICD-10-CM | POA: Diagnosis not present

## 2015-10-31 DIAGNOSIS — N4289 Other specified disorders of prostate: Secondary | ICD-10-CM | POA: Diagnosis not present

## 2015-10-31 DIAGNOSIS — K5901 Slow transit constipation: Secondary | ICD-10-CM | POA: Diagnosis not present

## 2015-10-31 DIAGNOSIS — R5383 Other fatigue: Secondary | ICD-10-CM | POA: Diagnosis not present

## 2015-10-31 LAB — URINALYSIS, ROUTINE W REFLEX MICROSCOPIC
Bilirubin Urine: NEGATIVE
Glucose, UA: NEGATIVE mg/dL
Hgb urine dipstick: NEGATIVE
KETONES UR: NEGATIVE mg/dL
LEUKOCYTES UA: NEGATIVE
NITRITE: NEGATIVE
PROTEIN: NEGATIVE mg/dL
Specific Gravity, Urine: >1.03 — ABNORMAL HIGH (ref 1.005–1.030)
pH: 5.5 (ref 5.0–8.0)

## 2015-10-31 LAB — CBC WITH DIFFERENTIAL/PLATELET
BASOS ABS: 0.1 10*3/uL (ref 0.0–0.1)
Basophils Relative: 1 %
EOS PCT: 5 %
Eosinophils Absolute: 0.4 10*3/uL (ref 0.0–0.7)
HCT: 39.3 % (ref 39.0–52.0)
Hemoglobin: 12.7 g/dL — ABNORMAL LOW (ref 13.0–17.0)
LYMPHS PCT: 31 %
Lymphs Abs: 2.4 10*3/uL (ref 0.7–4.0)
MCH: 27.7 pg (ref 26.0–34.0)
MCHC: 32.3 g/dL (ref 30.0–36.0)
MCV: 85.8 fL (ref 78.0–100.0)
MONO ABS: 0.4 10*3/uL (ref 0.1–1.0)
MONOS PCT: 5 %
Neutro Abs: 4.5 10*3/uL (ref 1.7–7.7)
Neutrophils Relative %: 58 %
PLATELETS: 287 10*3/uL (ref 150–400)
RBC: 4.58 MIL/uL (ref 4.22–5.81)
RDW: 15.5 % (ref 11.5–15.5)
WBC: 7.9 10*3/uL (ref 4.0–10.5)

## 2015-10-31 LAB — COMPREHENSIVE METABOLIC PANEL
ALT: 18 U/L (ref 17–63)
ANION GAP: 4 — AB (ref 5–15)
AST: 17 U/L (ref 15–41)
Albumin: 4 g/dL (ref 3.5–5.0)
Alkaline Phosphatase: 82 U/L (ref 38–126)
BUN: 18 mg/dL (ref 6–20)
CHLORIDE: 106 mmol/L (ref 101–111)
CO2: 27 mmol/L (ref 22–32)
Calcium: 9.7 mg/dL (ref 8.9–10.3)
Creatinine, Ser: 0.91 mg/dL (ref 0.61–1.24)
Glucose, Bld: 182 mg/dL — ABNORMAL HIGH (ref 65–99)
POTASSIUM: 4 mmol/L (ref 3.5–5.1)
Sodium: 137 mmol/L (ref 135–145)
TOTAL PROTEIN: 7.9 g/dL (ref 6.5–8.1)
Total Bilirubin: 0.4 mg/dL (ref 0.3–1.2)

## 2015-10-31 LAB — TSH: TSH: 1.064 u[IU]/mL (ref 0.350–4.500)

## 2015-10-31 NOTE — Progress Notes (Signed)
Location:   [facility] Room Number: 104/W Place of Service:  SNF (31) Provider:  Leeanne Deed, MD  Patient Care Team: Hendricks Limes, MD as PCP - General (Internal Medicine) Danie Binder, MD as Consulting Physician (Gastroenterology)  Extended Emergency Contact Information Primary Emergency Contact: Dorathy Kinsman [place] of [place] Phone: [phone] Relation: Daughter Secondary Emergency Contact: [place] Phone: [phone] Relation: Daughter  Code Status:  DNR Goals of care: Advanced Directive information Advanced Directives 10/31/2015  Does patient have an advance directive? Yes  Type of Advance Directive Out of facility DNR (pink MOST or yellow form)  Does patient want to make changes to advanced directive? No - Patient declined  Copy of advanced directive(s) in chart? Yes     Chief Complaint  Patient presents with  . Acute Visit    Heart rate low  Elevated blood pressure  HPI:  Pt is a 81 y.o. male seen today for an acute visit for follow-up of hypertension-also has at times bradycardia with pulse in the 50s.  Patient does have a history of hypertension she is currently on labetalol 200 mg 3 times a day with orders to hold for pulse less than 50-lisinopril 30 mg a day-blood pressures taken today showed some elevation with systolic 387 diastolic in the [id number] systolic was still in the mid 150s.  He is not complaining of any headache or dizziness does say he feels a bit more fatigued than usual however.  He is afebrile otherwise vital signs are stable I do note he does have bradycardia with pulses at times in the 50s I got 59 on exam today her chart review. Pulses run largely in the 60s and 70s with occasional pulses in the 50s.  Nursing did do an EKG which showed slight sinus bradycardia with a pulse of 59.  Currently he does not complaining any headache dizziness or syncopal  feelings to says he feels a bit more fatigued than usual.  Patient continues to do very well with supportive care in the facility    Past Medical History  Diagnosis Date  . CAD (coronary artery disease)     cath 11/2007: 70% LM ISR, SVG-LAD OK, SVG-D1 100%, IMA-D2 OK, SVG-OM OK, 4-vessel CABG 2000.  Marland Kitchen CAD (coronary artery disease)     s/p of 95% L main artery stenosis, 4/03: cutting balloon PTCA of 80% in-stent restenosis, 7/04. preserved L ventriculr function  . Carotid bruit     bilateral. no sigificant distal abd atherosclerosis by recent cath.   Marland Kitchen COPD (chronic obstructive pulmonary disease) (HCC)     ongoing tobacco   . DM2 (diabetes mellitus, type 2) ([place])   . Dyslipidemia   . HTN (hypertension)   . Glaucoma   . CVA (cerebral infarction)   . Dysphagia   . Constipation   . Anemia due to chronic blood loss   . Chronic bronchitis [facility])    Past Surgical History  Procedure Laterality Date  . Cardiac catheterization    . Colonoscopy N/A 05/14/2015    POOR PREP  . Esophagogastroduodenoscopy N/A 05/14/2015    CHRONIC GASTRITIS  . Colonoscopy N/A 06/02/2015    SLF: 1. four colorectal polyps removed. no source for anemia identified. 2. the left colon is redundant 3. moderate sized internal hemorroids.   Freda Munro capsule study N/A 06/17/2015    Procedure: GIVENS CAPSULE STUDY;  Surgeon: Danie Binder, MD;  Location: [facility];  Service: Endoscopy;  Laterality: N/A;  0800    No Known Allergies  Current Outpatient Prescriptions on File Prior to Visit  Medication Sig Dispense Refill  . Calcium Carbonate-Vitamin D (OSCAL 500/200 D-3 PO) 1 Tablet once a day by mouth . Can't be given with iron    . clopidogrel (PLAVIX) 75 MG tablet Take 75 mg by mouth daily.    Marland Kitchen docusate sodium (COLACE) 100 MG capsule Take 100 mg by mouth 2 (two) times daily.    . ferrous sulfate 325 (65 FE) MG tablet Take 1 tablet (325 mg total) by mouth 3 (three) times daily with meals. 90 tablet 11  .  labetalol (NORMODYNE) 200 MG tablet Take 200 mg by mouth 3 (three) times daily. Reported on 10/22/2015    . linaclotide (LINZESS) 72 MCG capsule Take 72 mcg by mouth daily before breakfast.    . lisinopril (PRINIVIL,ZESTRIL) 30 MG tablet Take 30 mg by mouth daily.    . Omega-3 Fatty Acids (FISH OIL) 1000 MG CAPS Take 1,000 mg by mouth 3 (three) times daily.    . pantoprazole (PROTONIX) 40 MG tablet Take 40 mg by mouth daily.    . polyethylene glycol powder (MIRALAX) powder Take 17 g by mouth 2 (two) times daily. 850 g 0  . pravastatin (PRAVACHOL) 80 MG tablet Take 80 mg by mouth daily.    Marland Kitchen senna (SENOKOT) 8.6 MG TABS tablet Take 2 tablets (17.2 mg total) by mouth daily. 120 each 0  . tamsulosin (FLOMAX) 0.4 MG CAPS capsule Take 0.4 mg by mouth daily after supper.     No current facility-administered medications on file prior to visit.    Review of Systems  General no complaints of fever or chills says he feels a bit more fatigued   Skin does not complain of rashes or itching.  Eyes does have a history of blindness.  Head ears s nose mouth and throat does not complain of sore throat or nasal discharge   Respiratory does not complain of cough or shortness of breath nursing staff has not noted this either.  Cardiac does not complaining of any chest pain.  GI is not complaining of nausea vomiting diarrhea constipation or abdominal discomfort.  GU does not specifically complaining of dysuria.  Musculoskeletal does have history of some left-sided weakness which is baseline is not really complaining of joint pain just generally feeling somewhat weak.  Neurologic is not complaining of dizziness headache or syncopal-type feelings.  Psych is not complaining of overt depression or anxiety       .     Immunization History  Administered Date(s) Administered  . Influenza,inj,Quad PF,36+ Mos 03/07/2015  . PPD Test 03/10/2015, 03/24/2015   Pertinent  Health Maintenance Due  Topic  Date Due  . FOOT EXAM  09/11/2016 (Originally 08/31/1944)  . OPHTHALMOLOGY EXAM  09/11/2016 (Originally 08/31/1944)  . URINE MICROALBUMIN  09/11/2016 (Originally 08/31/1944)  . PNA vac Low Risk Adult (1 of 2 - PCV13) 09/11/2016 (Originally 08/30/2001)  . INFLUENZA VACCINE  12/02/2015  . HEMOGLOBIN A1C  03/13/2016   No flowsheet data found. Functional Status Survey:    Filed Vitals:   10/31/15 0846  BP: 155/47  Pulse: 60  Temp: 99 F (37.2 C)  TempSrc: Oral  Resp: 21  Height: '5\' 9"'$  (1.753 m)  Weight: 169 lb (76.658 kg)  SpO2: 97%  Of note variable recent blood pressures show systolic ranging up to 401U diastolics in the 27O Body mass index is 24.95 kg/(m^2).  Physical Exam In general this is a pleasant elderly male in no distress sitting comfortably in his wheelchair.  His skin is warm and dry.  Eyes he does have opacity of his left eye again he does have a history of blindness.  Oropharynx is clear mucous membranes moist she is edentulous.  Chest is clear to auscultation there is no labored breathing.  Heart is regular rate and rhythm borderline bradycardic at 59-he does not have significant lower extremity edema.  Abdomen is soft nontender with positive bowel sounds  GU could not really appreciate overt dysuria.  Musculoskeletal does have some left-sided weakness upper and lower which is baseline otherwise moves extremities at baseline.  Neurologic as noted above does have left-sided weakness which is baseline speech is slightly slurred.  Psych appears grossly alert and oriented to self continues to be pleasant and follow simple verbal commands   Labs reviewed:  Recent Labs  08/02/15 0600 09/11/15 0715 10/23/15 0705  NA 140 141 137  K 3.7 4.1 3.7  CL 109 108 106  CO2 '23 25 25  '$ GLUCOSE 103* 114* 130*  BUN '14 14 11  '$ CREATININE 0.75 0.86 0.78  CALCIUM 9.3 9.2 8.7*    Recent Labs  05/10/15 0722 08/07/15 0715 09/11/15 0715  AST '20 25 18  '$ ALT '21 30 17    '$ ALKPHOS 67 66 62  BILITOT 0.4 0.2* 0.2*  PROT 8.0 6.8 7.3  ALBUMIN 3.9 3.5 3.4*    Recent Labs  05/21/15 0700 08/02/15 0600 09/11/15 0715 10/23/15 0705  WBC 7.0 6.9 6.0 6.8  NEUTROABS 4.2 3.7  --  3.2  HGB 9.1* 10.0* 11.0* 11.3*  HCT 27.3* 31.5* 34.2* 34.3*  MCV 85.6 82.2 83.4 84.1  PLT 344 338 174 259     Lab Results  Component Value Date   CHOL 119 08/07/2015   HDL 41 08/07/2015   LDLCALC 61 08/07/2015   TRIG 85 08/07/2015   CHOLHDL 2.9 08/07/2015    Significant Diagnostic Results in last 30 days:  No results found.  Assessment/Plan  #1-hypertension-again readings today indicate some systolic elevations I do see previous recommendation by Dr. Linna Darner to increase lisinopril if systolic consistently above 150 this appears to be the case today Will increase lisinopril to 40 mg a day monitor blood pressures Q shift a day with pulse continue to hold labetalol for pulse less than 60.  In regards to weakness EKG was done which shows borderline sinus bradycardia with a pulse of 59-will update a UA CNS and also update blood work including a CBC and metabolic panelWell as TSH-also monitor his vital signs every shift for 48 hours.  This was discussed with Dr. Linna Darner via phone  Clinically he appears to be stable and essentially at baseline although apparently according nursing is not quite himself today appears somewhat more fatigued.  [place], [place], [place]

## 2015-11-02 LAB — URINE CULTURE

## 2015-11-07 ENCOUNTER — Encounter: Payer: Self-pay | Admitting: Cardiovascular Disease

## 2015-11-07 ENCOUNTER — Ambulatory Visit: Payer: Medicare Other | Admitting: Cardiovascular Disease

## 2015-11-07 DIAGNOSIS — R0989 Other specified symptoms and signs involving the circulatory and respiratory systems: Secondary | ICD-10-CM

## 2015-11-12 ENCOUNTER — Non-Acute Institutional Stay (SKILLED_NURSING_FACILITY): Payer: Medicare Other | Admitting: Internal Medicine

## 2015-11-12 ENCOUNTER — Encounter: Payer: Self-pay | Admitting: Internal Medicine

## 2015-11-12 DIAGNOSIS — I10 Essential (primary) hypertension: Secondary | ICD-10-CM

## 2015-11-12 DIAGNOSIS — Z8673 Personal history of transient ischemic attack (TIA), and cerebral infarction without residual deficits: Secondary | ICD-10-CM | POA: Diagnosis not present

## 2015-11-12 DIAGNOSIS — D500 Iron deficiency anemia secondary to blood loss (chronic): Secondary | ICD-10-CM | POA: Diagnosis not present

## 2015-11-12 DIAGNOSIS — E1151 Type 2 diabetes mellitus with diabetic peripheral angiopathy without gangrene: Secondary | ICD-10-CM

## 2015-11-12 NOTE — Progress Notes (Signed)
Patient ID: [name], male   DOB: 08-11-1934, 81 y.o.   MRN: [id number]                                FACILITY: [facility]                    LEVEL OF CARE:   SNF  This is a routine visit   Acute Visit                    CHIEF COMPLAINT: Medical management of chronic medical issues including anemia-history of colitis-hypertension-diabetes type 2-COPD-history of CVA.   F     HISTORY OF PRESENT ILLNESS:  [name] is a   pleasant 81year-old male with a complicated medical history He has been enjoying a period of relative stability which is encouraging  -on  Initial dmission to hospital  He was felt to have abdominal pain secondary to constipation and ischemic colitis.  Discharged here on a seven-day course of ciprofloxacin and Flagyl.  From this regard, everything has been stable.   He has not been complaining of pain.    He also was felt to have a hypertensive urgency when he arrived in the hospital with a blood pressure of 206/77-this is stabilized however ta one point was on possibly may have orthostatic hypotension   Secondary to somewhat elevated systolics and lisinopril was recently titrated up to 40 mg a day he's also on labetalol 200 mg three times  a day he continues having variability but I do not see consistent systolic elevations above 150 most recent blood pressures 126/77-130/66-132/76-162/60.  -      Several months ago, the nurses here noted a left facial droop and sent him to the ER.  A CT scan of the head was negative.       The patient had a previous MRI of the brain on November 2nd.  This showed no acute or subacute infarction.  However, he does have stable remote infarcts involving the right cerebellum, left greater than right basal ganglia, brainstem, and right greater than left corona radiata.--He continues on Plavix--  In regards to anemia --it was noted his hemoglobin had dropped down to 9.2 he did have an occult positive  stool test-we did start him on a proton pump inhibitor and he was seen by GI they did do a capsule study back in February that showed multiple small bowel AVMs-thought  this may cause low iron and low blood counts and recommendation was to continue iron pills indefinitely.   His hemoglobin has risen fairly significantly most recently 12.7 on lab done 10/31/2015  He did see gastroenterology on May 3 thought to be doing well he had complain of constipation and Linzess was added--as well as MiraLAX when necessary-apparently this has been tolerated well.  In the past he did have episodes of unresponsiveness however he quickly returned to baseline when  put to bed and his legs are elevated-he has been seen by cardiology --actually he has not has these episodes recently which is reassuring    There is some suspicion he does have a history of seizures     At one point patient had significant failure to thrive issues but this is turned around fairly dramatically he has a very good appetite  Current  weight at 176 appears to be a gain of about 7 pounds since May  Lab done on January 7 was concerning for a creatinine of 1.93 which is significantly above his baseline which appears to be around 1-he did go to the ER and received IV fluids This is stabilized as well with a creatinine 0.91 and BUN of 18 on 10/31/2015  He does have a history of type 2 diabetes he was on Glucophage but this has been discontinued secondary to stability a.m. blood sugars  Hemoglobin A1c on 09/11/2015 6.7-morning blood sugars appear to be largely in the low 100s some more variability at at bedtime ranging from mid 100s up to low 200s updated healing: A1c is due in August   I recently saw him for nursing concerns of increased weakness we did do an EKG which showed borderline sinus bradycardia again he is on a beta blocker with orders to hold atenolol for pulse less than 60.  Later in that day he appeared to be at his  baseline labs were unremarkable there's been no further increased weakness noted-we did do a urine analysis which was pretty benign             Past Medical History  Diagnosis Date  . CAD (coronary artery disease)     multi vessel. s/p recent bare metal stenting of high grade proximal R CA stenosis. residual 70% : main in-stent restenosis, with distal filling of the left anterior descending from graft flow. patent bypass graft, except for 100% occlusion of saphernous vein graft - 1st diagonal branch. 3-vessel CABG 2000.  Marland Kitchen CAD (coronary artery disease)     s/p of 95% L main artery stenosis, 4/03: cutting balloon PTCA of 80% in-stent restenosis, 7/04. preserved L ventriculr function  . Carotid bruit     bilateral. no sigificant distal abd atherosclerosis by recent cath.   Marland Kitchen COPD (chronic obstructive pulmonary disease) (HCC)     ongoing tobacco   . DM2 (diabetes mellitus, type 2) ([place])   . Dyslipidemia   . HTN (hypertension)   . Glaucoma   . CVA (cerebral infarction)   . Dysphagia   . Constipation       Past Surgical History  Procedure Laterality Date  . Cardiac catheterization      Family medical social history reviewed per history and physical on 03/12/2015  Current Outpatient Prescriptions on File Prior to Visit  Medication Sig Dispense Refill  .  Protonix 40 mg daily.  Iron 325 mg 3 times a day   90 tablet 5  .      .      . bisacodyl (DULCOLAX) 10 MG suppository Place 1 suppository (10 mg total) rectally daily as needed (no BM in 48 hours). 12 suppository 0  . Calcium Carbonate-Vitamin D (OS-CAL 500 + D PO) Take 1 tablet by mouth daily as needed.    . ciprofloxacin (CIPRO) 500 MG tablet Take 1 tablet (500 mg total) by mouth 2 (two) times daily. 4 tablet 0  . clopidogrel (PLAVIX) 75 MG tablet Take 75 mg by mouth daily.    Marland Kitchen docusate sodium (COLACE) 100 MG capsule Take 100 mg by mouth 2 (two) times daily.    . ferrous sulfate 325 (65 FE) MG tablet Take 325 mg by  mouth daily with breakfast.           .      0  . labetalol (NORMODYNE) 200 MG tablet Take 1 tablet (200 mg total) by mouth 3 (three) times daily. 90 tablet 0  . lisinopril (PRINIVIL,ZESTRIL) 40 MG tablet  Take 40 mg by mouth daily.      . Maltodextrin-Xanthan Gum (RESOURCE THICKENUP CLEAR) POWD Per instructions to create nectar thick liquids (Patient not taking: Reported on 03/15/2015) 5 Can 0          .    6 tablet 0  . Multiple Vitamins-Minerals (MULTIVITAMIN PO) Take 1 tablet by mouth daily as needed.    . nicotine (NICODERM CQ - DOSED IN MG/24 HOURS) 14 mg/24hr patch Place 1 patch (14 mg total) onto the skin daily. 28 patch 0         . Omega-3 Fatty Acids (FISH OIL) 1000 MG CAPS Take 1,000 mg by mouth 3 (three) times daily.    . polyethylene glycol powder (MIRALAX) powder Take 17 g by mouth 2 (two) times daily. 850 g 0  .       . pravastatin (PRAVACHOL) 80 MG tablet Take 1 tablet (80 mg total) by mouth daily at 6 PM. 30 tablet 0  . senna (SENOKOT) 8.6 MG TABS tablet Take 2 tablets (17.2 mg total) by mouth daily. 120 each 0  . tamsulosin (FLOMAX) 0.4 MG CAPS capsule Take 0.4 mg by mouth daily after supper.     Of note he has been started on Linzess 72 mg daily.  \Is also on MiraLAX twice a day when necessary   LABORATORY DATA  10/31/2015.  WBC 7.9 hemoglobin 12.7 platelets 287.  Sodium 137 potassium 4 BUN 18 creatinine 0.91.  Liver function tests within normal limits.  TSH-1.064.  Hemoglobin A1c 6.7 on 09/11/2015  08/09/2015.  Cholesterol 119-HDL 41-LDL 61-triglycerides 85.  08/07/2015.  Liver function tests essentially within normal limits bilirubin slightly low at 0.2 08/02/2015.  Sodium 140 potassium 3.7 BUN 14 creatinine 0.75.  WBC 6.9 hemoglobin 10.0 platelets 338.  05/15/2015.  Cholesterol 81-triglycerides 73--HDL 26--LDL 40  05/10/2015.  Liver function tests within normal limits  May 12 2015.  Sodium 144 potassium 4.7 BUN 31 creatinine  1.09.  May 10 2015.  Sodium 143 potassium 5.2 BUN 59 creatinine 1.93.  WBC 8.2 hemoglobin 10.2 platelets 299  04/07/2015.  Sodium 143 potassium 3.7 BUN 24 creatinine 0.9.  WBC 13.8 hemoglobin 8.1 platelets 444.  :  04/02/2015.  Sodium 139 potassium 4.1 BUN 15 creatinine 0.77.  03/31/2015.  WBC 11.8 hemoglobin 8.1 platelets 362.  Urine culture 03/30/2015-multiple species non-predominant       03/26/2015.  WBC 9.4 hemoglobin 9.2 platelets 367.  Sodium 137 potassium 4.5 BUN 23 creatinine 0.88.  November 18-----  hemoglobin was 11.4.  :     Sodium 138, BUN 13, creatinine 0.99.    White count 9, hemoglobin 11.4.    REVIEW OF SYSTEMS:    GENERAL:  The patient does not have any specific complaints--per nursing continues to do well Skin does not complain of rashes or itching.   HEENT:   No headache. Appears to be legally blind does have some sight in his right eye apparently  CHEST/RESPIRATORY:  No shortness of breath.   CARDIAC:  No chest pain.   GI:    no abdominal pain, diarrhea. GU-does not complaining of dysuria   NEUROLOGICAL:  He is not complaining of lateralizing weakness or dizziness-again apparently there is some thought he may have occasional seizure activity although has not had one in some time--apparently had a period transitory weakness last month but this quickly resolved again lab work EKG was unremarkable neurologic I did not really see any changes Psych-nursing does not report any issues continues to  be pleasant and cooperative.  Marland Kitchen      PHYSICAL EXAMINATION:    He is afebrile pulse 68 respirations 20 blood pressure 126/77  GENERAL APPEARANCE:  The patient is not in any distress sitting comfortably in his wheelchair he is alert laughing in good spirits t  His skin is warm and dry Eyes-apparently he is legally blind he does apparently have some vision in his right eye but has significant visual --this appears to be baseline. Left eye continues  to be opaque Oropharynx clear mucous membranes moist               CHEST/RESPIRATORY:  Clear air entry bilaterally.  No labored breathing  CARDIOVASCULAR:   CARDIAC: .  Heart sounds 2/6 systolic murmur.  Marland Kitchen--Minimal lower extremity edema      GASTROINTESTINAL:   ABDOMEN: Soft nontender with positive bowel sounds    Musculoskeletal-appears to move his extremities at baseline he ambulates in a wheelchairoften no lateralizing findings in his extremities         NEUROLOGICAL:    CRANIAL NERVES:   He does  have left facial weakness.  This is not new.   Otherwise appears to move his extremities at baseline some weakness of his left upper extremity although he can move this and cannot raise as high as his right arm Psych he appears grossly alert and oriented pleasant and appropriate         ASSESSMENT/PLAN:    Anemia- He has been seen by GI-hemoglobin appears this stabilized most recently 12.7-she is now on aggressive iron supplementation 3 times a day-capsule study was done again by GI back in February he did find AVMs or recommendation essentially to continue iron supplementation indefinitely-. He also continues on Protonix --Continue to monitor CBGs periodically   #2 renal insufficiency this appears to have stabilized with a creatinine of 0.91 on lab done 10/31/2015.  #3 history of ischemic colitis this has not reoccurred this has been stable again he is gaining weight does not complain of any abdominal pain.  #4-history hypertension As noted above she is now on Cipro 40 mg a day in addition to labetalol 200 mg 3 times a day-this appears variable but I do not see consistent systolic elevations over 035 as noted above at this point continue to monitor   #5 diabetes type 2- this appears stable despite being off Glucophage blood sugars as noted above he does have some elevations later in the day but hemoglobin A1c of 6.7 back in May is satisfactory-update hemoglobin A1c is slated for  August  #6 History CVA apparently has had multiple small infarcts in the past-this appears stable he is on Plavix  #7 history COPD this has been essentially asymptomatic at this point will monitor.  #8 weight loss this does not really appear to be an issue anymore he is significantly gaining weight I suspect this is appetite related his edema is minimal he is doing well clinically-Remeron has been discontinued  #9 constipation patient did complain of this to gastroenterologist recently-although nursing staff reports she is having pretty regular bowel movements-he is now on Linzess once a day also MiraLAX twice a day when necessary continues on Colace 100 mg twice a day and senna daily this appears to be stable at this point will monitor.  #10 history hyperlipidemia he is on a statin--lipid panel on April 8 show stability with cholesterol 119 triglycerides 85 HDL 41 LDL 61- liver function tests  on 08/07/2015 appear to be largely  within normal limits--of note also continues on fish oil  #11 bradycardia-this appears to be asymptomatic at times he does have pulses in the 50s there are orders to hold his labetalol for any pulse under 60-he does not show any symptomatic weakness syncope at this time at this point will monitor-  [id number] of note greater than 35 minutes spent assessing patient-discussing his status with nursing staff-reviewing his chart and labs-and coordinating and formulating plan of care for numerous diagnoses-of note greater than 50% of time spent coordinating plan of care      .

## 2015-12-04 ENCOUNTER — Ambulatory Visit: Payer: Medicare Other | Admitting: Cardiovascular Disease

## 2015-12-04 ENCOUNTER — Encounter (HOSPITAL_COMMUNITY)
Admission: RE | Admit: 2015-12-04 | Discharge: 2015-12-04 | Disposition: A | Discharge status: Home / Self Care | Payer: Medicare Other | Source: Skilled Nursing Facility | Attending: Internal Medicine | Admitting: Internal Medicine

## 2015-12-04 DIAGNOSIS — D500 Iron deficiency anemia secondary to blood loss (chronic): Secondary | ICD-10-CM | POA: Insufficient documentation

## 2015-12-04 DIAGNOSIS — N4289 Other specified disorders of prostate: Secondary | ICD-10-CM | POA: Diagnosis not present

## 2015-12-04 DIAGNOSIS — F5089 Other specified eating disorder: Secondary | ICD-10-CM | POA: Insufficient documentation

## 2015-12-04 DIAGNOSIS — K559 Vascular disorder of intestine, unspecified: Secondary | ICD-10-CM | POA: Diagnosis not present

## 2015-12-04 LAB — CBC WITH DIFFERENTIAL/PLATELET
BASOS PCT: 1 %
Basophils Absolute: 0 10*3/uL (ref 0.0–0.1)
EOS ABS: 0.5 10*3/uL (ref 0.0–0.7)
EOS PCT: 6 %
HCT: 35.9 % — ABNORMAL LOW (ref 39.0–52.0)
Hemoglobin: 11.7 g/dL — ABNORMAL LOW (ref 13.0–17.0)
LYMPHS ABS: 2 10*3/uL (ref 0.7–4.0)
Lymphocytes Relative: 29 %
MCH: 27.7 pg (ref 26.0–34.0)
MCHC: 32.6 g/dL (ref 30.0–36.0)
MCV: 85.1 fL (ref 78.0–100.0)
MONO ABS: 0.5 10*3/uL (ref 0.1–1.0)
MONOS PCT: 7 %
Neutro Abs: 4 10*3/uL (ref 1.7–7.7)
Neutrophils Relative %: 57 %
PLATELETS: 288 10*3/uL (ref 150–400)
RBC: 4.22 MIL/uL (ref 4.22–5.81)
RDW: 13.9 % (ref 11.5–15.5)
WBC: 7 10*3/uL (ref 4.0–10.5)

## 2015-12-04 LAB — FERRITIN: FERRITIN: 41 ng/mL (ref 24–336)

## 2015-12-05 ENCOUNTER — Non-Acute Institutional Stay (SKILLED_NURSING_FACILITY): Payer: Medicare Other | Admitting: Internal Medicine

## 2015-12-05 ENCOUNTER — Encounter: Payer: Self-pay | Admitting: Internal Medicine

## 2015-12-05 DIAGNOSIS — R040 Epistaxis: Secondary | ICD-10-CM

## 2015-12-05 DIAGNOSIS — D509 Iron deficiency anemia, unspecified: Secondary | ICD-10-CM

## 2015-12-05 NOTE — Progress Notes (Signed)
Location:   [facility] Room Number: 104/W Place of Service:  SNF [phone]) Provider:  Leeanne Deed, MD  Patient Care Team: Hendricks Limes, MD as PCP - General (Internal Medicine) Danie Binder, MD as Consulting Physician (Gastroenterology)  Extended Emergency Contact Information Primary Emergency Contact: [name] of [place] Phone: [phone] Relation: Daughter Secondary Emergency Contact: [place] of [place] Phone: [phone] Relation: Daughter  Code Status:  DNR Goals of care: Advanced Directive information Advanced Directives 12/05/2015  Does patient have an advance directive? Yes  Type of Advance Directive Out of facility DNR (pink MOST or yellow form)  Does patient want to make changes to advanced directive? No - Patient declined  Copy of advanced directive(s) in chart? Yes  Would patient like information on creating an advanced directive? -     Chief Complaint  Patient presents with  . Acute Visit    Bloody nose    HPI:  Pt is a 81 y.o. male seen today for an acute visit forA short episode of epistaxis.  Apparently was no trauma patient developed a nosebleed of short duration this apparently resolved fairly quickly with gentle pressure to the area.  Currently patient is sitting comfortably in his wheelchair does not report any discomfort or breathing difficulties or increased weakness he continues to do quite well after initially being admitted with failure to thrive issues.  He does have a history CVA on Plavix-also a history of anemia but this has improved significantly on iron he was seen by GI thought to have multiple small bowel AVMs  He continues on a proton pump inhibitor as well      Past Medical History:  Diagnosis Date  . Anemia due to chronic blood loss   . CAD (coronary artery disease)    cath 11/2007: 70% LM ISR, SVG-LAD OK, SVG-D1 100%, IMA-D2 OK,  SVG-OM OK, 4-vessel CABG 2000.  Marland Kitchen CAD (coronary artery disease)    s/p of 95% L main artery stenosis, 4/03: cutting balloon PTCA of 80% in-stent restenosis, 7/04. preserved L ventriculr function  . Carotid bruit    bilateral. no sigificant distal abd atherosclerosis by recent cath.   . Chronic bronchitis ([place])   . Constipation   . COPD (chronic obstructive pulmonary disease) (HCC)    ongoing tobacco   . CVA (cerebral infarction)   . DM2 (diabetes mellitus, type 2) ([place])   . Dyslipidemia   . Dysphagia   . Glaucoma   . HTN (hypertension)    Past Surgical History:  Procedure Laterality Date  . CARDIAC CATHETERIZATION    . COLONOSCOPY N/A 05/14/2015   POOR PREP  . COLONOSCOPY N/A 06/02/2015   SLF: 1. four colorectal polyps removed. no source for anemia identified. 2. the left colon is redundant 3. moderate sized internal hemorroids.   . ESOPHAGOGASTRODUODENOSCOPY N/A 05/14/2015   CHRONIC GASTRITIS  . GIVENS CAPSULE STUDY N/A 06/17/2015   Procedure: GIVENS CAPSULE STUDY;  Surgeon: Danie Binder, MD;  Location[facility];  Service: Endoscopy;  Laterality: N/A;  0800    No Known Allergies  Current Outpatient Prescriptions on File Prior to Visit  Medication Sig Dispense Refill  . Calcium Carbonate-Vitamin D (OSCAL 500/200 D-3 PO) 1 Tablet once a day by mouth . Can't be given with iron    . clopidogrel (PLAVIX) 75 MG tablet Take 75 mg by mouth daily.    Marland Kitchen docusate sodium (COLACE) 100 MG capsule  Take 100 mg by mouth 2 (two) times daily.    . ferrous sulfate 325 (65 FE) MG tablet Take 1 tablet (325 mg total) by mouth 3 (three) times daily with meals. 90 tablet 11  . labetalol (NORMODYNE) 200 MG tablet Take 200 mg by mouth 3 (three) times daily. Reported on 10/22/2015    . linaclotide (LINZESS) 72 MCG capsule Take 72 mcg by mouth daily before breakfast.    . lisinopril (PRINIVIL,ZESTRIL) 30 MG tablet Take 40 mg by mouth daily.     . Omega-3 Fatty Acids (FISH OIL) 1000 MG CAPS Take 1,000  mg by mouth 3 (three) times daily.    . pantoprazole (PROTONIX) 40 MG tablet Take 40 mg by mouth daily.    . polyethylene glycol powder (MIRALAX) powder Take 17 g by mouth 2 (two) times daily. 850 g 0  . pravastatin (PRAVACHOL) 80 MG tablet Take 80 mg by mouth daily.    Marland Kitchen senna (SENOKOT) 8.6 MG TABS tablet Take 2 tablets (17.2 mg total) by mouth daily. 120 each 0  . tamsulosin (FLOMAX) 0.4 MG CAPS capsule Take 0.4 mg by mouth daily after supper.     No current facility-administered medications on file prior to visit.      Review of Systems   General is not ver chills.  Does not complain of rashitching no increased bruising is been noted.  Head ears eyes nose mouth and throat other than the short episode of nose bleeding does not complain of any issues he does have a history of blindness which is baseline.  Respiratory not complaint shortness breath or cough.  Cardiac does not complain of chest pain  GI is not complaining of any abdominal discomfort nausea vomiting diarrhea or constipation currently  Immunization History  Administered Date(s) Administered  . Influenza,inj,Quad PF,36+ Mos 03/07/2015  . PPD Test 03/10/2015, 03/24/2015   Pertinent  Health Maintenance Due  Topic Date Due  . INFLUENZA VACCINE  02/01/2016 (Originally 12/02/2015)  . FOOT EXAM  09/11/2016 (Originally 08/31/1944)  . OPHTHALMOLOGY EXAM  09/11/2016 (Originally 08/31/1944)  . URINE MICROALBUMIN  09/11/2016 (Originally 08/31/1944)  . PNA vac Low Risk Adult (1 of 2 - PCV13) 09/11/2016 (Originally 08/30/2001)  . HEMOGLOBIN A1C  03/13/2016   No flowsheet data found. Functional Status Survey:    Vitals:   12/05/15 1600  BP: 128/69  Pulse: (!) 59  Resp: 19  SpO2: 93%  Weight: 169 lb (76.7 kg)  Height: '5\' 9"'$  (1.753 m)   Body mass index is 24.96 kg/m. Physical Exam GENERAL APPEARANCE:  The patient is not in any distress sitting comfortably in his wheelchair he is alert  in good spirits t  His skin is  warm and dry Eyes-apparently he is legally blind he does apparently have some vision in his right eye but has significant visual deficits--this appears to be baseline. Left eye continues to be opaque Oropharynx clear mucous membranes moist   Nose-she does have some dried blood in the left turbinate no active bleeding              CHEST/RESPIRATORY:  Clear air entry bilaterally.  No labored breathing  CARDIOVASCULAR:   CARDIAC: .  Heart sounds 2/6 systolic murmur.  Marland Kitchen--Minimal lower extremity edema      GASTROINTESTINAL:   ABDOMEN: Soft nontender with positive bowel sounds    Musculoskeletal-appears to move his extremities at baseline he ambulates in a wheelchairoften no lateralizing findings in his extremities  NEUROLOGICAL:    CRANIAL NERVES:   He does  have left facial weakness.  This is not new.     Labs reviewed:  Recent Labs  09/11/15 0715 10/23/15 0705 10/31/15 1502  NA 141 137 137  K 4.1 3.7 4.0  CL 108 106 106  CO2 '25 25 27  '$ GLUCOSE 114* 130* 182*  BUN '14 11 18  '$ CREATININE 0.86 0.78 0.91  CALCIUM 9.2 8.7* 9.7    Recent Labs  08/07/15 0715 09/11/15 0715 10/31/15 1502  AST '25 18 17  '$ ALT '30 17 18  '$ ALKPHOS 66 62 82  BILITOT 0.2* 0.2* 0.4  PROT 6.8 7.3 7.9  ALBUMIN 3.5 3.4* 4.0    Recent Labs  10/23/15 0705 10/31/15 1502 12/04/15 0700  WBC 6.8 7.9 7.0  NEUTROABS 3.2 4.5 4.0  HGB 11.3* 12.7* 11.7*  HCT 34.3* 39.3 35.9*  MCV 84.1 85.8 85.1  PLT 259 287 288   Lab Results  Component Value Date   TSH 1.064 10/31/2015   Lab Results  Component Value Date   HGBA1C 6.7 (H) 09/11/2015   Lab Results  Component Value Date   CHOL 119 08/07/2015   HDL 41 08/07/2015   LDLCALC 61 08/07/2015   TRIG 85 08/07/2015   CHOLHDL 2.9 08/07/2015    Significant Diagnostic Results in last 30 days:  No results found.  Assessment/Plan #1 epistaxis-apparently this was of very short duration I do not see any sign of distress here whatsoever at this point will  monitor hemoglobin yesterday on August 3 was 11.7 which show stability --platelets wer at 288,000. He does continue on iron with a history of suspected small bowel AVMs-also continues on a proton pump inhibitor.  In regards to CVA continues on Plavix as well again he appears to be tolerating this well with a stable hemoglobin.   [place], [place], [place]

## 2015-12-18 ENCOUNTER — Non-Acute Institutional Stay (SKILLED_NURSING_FACILITY): Payer: Medicare Other | Admitting: Internal Medicine

## 2015-12-18 ENCOUNTER — Encounter: Payer: Self-pay | Admitting: Internal Medicine

## 2015-12-18 DIAGNOSIS — E113593 Type 2 diabetes mellitus with proliferative diabetic retinopathy without macular edema, bilateral: Secondary | ICD-10-CM | POA: Diagnosis not present

## 2015-12-18 DIAGNOSIS — I639 Cerebral infarction, unspecified: Secondary | ICD-10-CM | POA: Diagnosis not present

## 2015-12-18 DIAGNOSIS — E1151 Type 2 diabetes mellitus with diabetic peripheral angiopathy without gangrene: Secondary | ICD-10-CM | POA: Diagnosis not present

## 2015-12-18 DIAGNOSIS — D649 Anemia, unspecified: Secondary | ICD-10-CM | POA: Diagnosis not present

## 2015-12-18 DIAGNOSIS — I10 Essential (primary) hypertension: Secondary | ICD-10-CM | POA: Diagnosis not present

## 2015-12-18 NOTE — Progress Notes (Signed)
Location:   [facility] Room Number: 104/W Place of Service:  SNF [id number]) Provider: Haze Justin, MD  Patient Care Team: Hendricks Limes, MD as PCP - General (Internal Medicine) Danie Binder, MD as Consulting Physician (Gastroenterology)  Extended Emergency Contact Information Primary Emergency Contact: Dorathy Kinsman [place] of [place] Phone: [phone] Relation: Daughter Secondary Emergency Contact: [place] of [place] Phone: [phone] Relation: Daughter  Code Status: DNR Goals of care: Advanced Directive information Advanced Directives 12/18/2015  Does patient have an advance directive? Yes  Type of Advance Directive Out of facility DNR (pink MOST or yellow form)  Does patient want to make changes to advanced directive? No - Patient declined  Copy of advanced directive(s) in chart? Yes  Would patient like information on creating an advanced directive? -     Chief Complaint  Patient presents with  . Medical Management of Chronic Issues    Routine visit    HPI:  Pt is a 81 y.o. male seen today for medical management of chronic diseases.   Patient is doing well. He is not have any new complains. His appetite is good and he is sleeping well. No C/O Chest pain, cough or SOB. Gets around in wheel chair.   Past Medical History:  Diagnosis Date  . Anemia due to chronic blood loss   . CAD (coronary artery disease)    cath 11/2007: 70% LM ISR, SVG-LAD OK, SVG-D1 100%, IMA-D2 OK, SVG-OM OK, 4-vessel CABG 2000.  Marland Kitchen CAD (coronary artery disease)    s/p of 95% L main artery stenosis, 4/03: cutting balloon PTCA of 80% in-stent restenosis, 7/04. preserved L ventriculr function  . Carotid bruit    bilateral. no sigificant distal abd atherosclerosis by recent cath.   . Chronic bronchitis ([place])   . Constipation   . COPD (chronic obstructive pulmonary disease) (HCC)    ongoing tobacco   . CVA  (cerebral infarction)   . DM2 (diabetes mellitus, type 2) ([place])   . Dyslipidemia   . Dysphagia   . Glaucoma   . HTN (hypertension)    Past Surgical History:  Procedure Laterality Date  . CARDIAC CATHETERIZATION    . COLONOSCOPY N/A 05/14/2015   POOR PREP  . COLONOSCOPY N/A 06/02/2015   SLF: 1. four colorectal polyps removed. no source for anemia identified. 2. the left colon is redundant 3. moderate sized internal hemorroids.   . ESOPHAGOGASTRODUODENOSCOPY N/A 05/14/2015   CHRONIC GASTRITIS  . GIVENS CAPSULE STUDY N/A 06/17/2015   Procedure: GIVENS CAPSULE STUDY;  Surgeon: Danie Binder, MD;  [facility];  Service: Endoscopy;  Laterality: N/A;  0800    No Known Allergies  Current Outpatient Prescriptions on File Prior to Visit  Medication Sig Dispense Refill  . Calcium Carbonate-Vitamin D (OSCAL 500/200 D-3 PO) 1 Tablet once a day by mouth . Can't be given with iron    . clopidogrel (PLAVIX) 75 MG tablet Take 75 mg by mouth daily.    Marland Kitchen docusate sodium (COLACE) 100 MG capsule Take 100 mg by mouth 2 (two) times daily.    . ferrous sulfate 325 (65 FE) MG tablet Take 1 tablet (325 mg total) by mouth 3 (three) times daily with meals. 90 tablet 11  . labetalol (NORMODYNE) 200 MG tablet Take 200 mg by mouth 3 (three) times daily. Reported on 10/22/2015    . linaclotide (LINZESS) 72 MCG capsule Take 72 mcg by mouth daily before  breakfast.    . lisinopril (PRINIVIL,ZESTRIL) 30 MG tablet Take 40 mg by mouth daily.     . Omega-3 Fatty Acids (FISH OIL) 1000 MG CAPS Take 1,000 mg by mouth 3 (three) times daily.    . pantoprazole (PROTONIX) 40 MG tablet Take 40 mg by mouth daily.    . polyethylene glycol powder (MIRALAX) powder Take 17 g by mouth 2 (two) times daily. 850 g 0  . pravastatin (PRAVACHOL) 80 MG tablet Take 80 mg by mouth daily.    Marland Kitchen senna (SENOKOT) 8.6 MG TABS tablet Take 2 tablets (17.2 mg total) by mouth daily. 120 each 0  . tamsulosin (FLOMAX) 0.4 MG CAPS capsule Take  0.4 mg by mouth daily after supper.     No current facility-administered medications on file prior to visit.     Review of Systems  Constitutional: Negative for activity change, appetite change, chills and diaphoresis.  HENT: Negative for congestion.   Eyes: Negative.   Respiratory: Negative.   Cardiovascular: Negative.     Immunization History  Administered Date(s) Administered  . Influenza,inj,Quad PF,36+ Mos 03/07/2015  . PPD Test 03/10/2015, 03/24/2015   Pertinent  Health Maintenance Due  Topic Date Due  . INFLUENZA VACCINE  02/01/2016 (Originally 12/02/2015)  . FOOT EXAM  09/11[birth year]2016 (Originally 08/31/1944)  . OPHTHALMOLOGY EXAM  09/11/2016 (Originally 08/31/1944)  . URINE MICROALBUMIN  09/11/2016 (Originally 08/31/1944)  . PNA vac Low Risk Adult (1 of 2 - PCV13) 09/11/2016 (Originally 08/30/2001)  . HEMOGLOBIN A1C  03/13/2016   No flowsheet data found. Functional Status Survey:    Vitals:   12/18/15 1410  BP: (!) 147/62  Pulse: 64  Resp: 20  Temp: 98 F (36.7 C)  TempSrc: Oral  SpO2: 96%  Weight: 179 lb 3.2 oz (81.3 kg)   Body mass index is 26.46 kg/m. Physical Exam  Constitutional: He is oriented to person, place, and time. He appears well-developed and well-nourished.  HENT:  Head: Normocephalic.  Neck: Neck supple.  Cardiovascular: Normal heart sounds.   Pulmonary/Chest: Breath sounds normal. No respiratory distress. He has no wheezes. He has no rales.  Abdominal: He exhibits no distension. There is no tenderness. There is no rebound.  Musculoskeletal: He exhibits edema.  B/L  edema  Neurological: He is alert and oriented to person, place, and time.  Had 4/5 in RUE and 3/5 in LUE 3/5 in RLE and 2/5 in LLE    Labs reviewed:  Recent Labs  09/11/15 0715 10/23/15 0705 10/31/15 1502  NA 141 137 137  K 4.1 3.7 4.0  CL 108 106 106  CO2 '25 25 27  '$ GLUCOSE 114* 130* 182*  BUN '14 11 18  '$ CREATININE 0.86 0.78 0.91  CALCIUM 9.2 8.7* 9.7    Recent Labs   08/07/15 0715 09/11/15 0715 10/31/15 1502  AST '25 18 17  '$ ALT '30 17 18  '$ ALKPHOS 66 62 82  BILITOT 0.2* 0.2* 0.4  PROT 6.8 7.3 7.9  ALBUMIN 3.5 3.4* 4.0    Recent Labs  10/23/15 0705 10/31/15 1502 12/04/15 0700  WBC 6.8 7.9 7.0  NEUTROABS 3.2 4.5 4.0  HGB 11.3* 12.7* 11.7*  HCT 34.3* 39.3 35.9*  MCV 84.1 85.8 85.1  PLT 259 287 288   Lab Results  Component Value Date   TSH 1.064 10/31/2015   Lab Results  Component Value Date   HGBA1C 6.7 (H) 09/11/2015   Lab Results  Component Value Date   CHOL 119 08/07/2015   HDL 41 08/07/2015  LDLCALC 61 08/07/2015   TRIG 85 08/07/2015   CHOLHDL 2.9 08/07/2015    Assessment/Plan Essential hypertension  BP runs sometimes around 725 systolic. But would not make any changes right now. Continue monitor. Consider increasing lisinopril.   Anemia, unspecified  He has had extensive w/u before with colonoscopy and EGD . HGB stable. Continue Iron supplement. Last HGB ws in 08/17 11.3  DM (diabetes mellitus) with peripheral vascular complication (HCC) Patients BS r running 150 in morning to 250 in afternoon. I think he would be benefited with metformin or levimir. Will d/w nurse. Last HGBA1C was 6.7 in 06/17  Cerebrovascular accident (CVA), unspecified mechanism ([place])  Doing well continue Plavix.

## 2015-12-19 ENCOUNTER — Ambulatory Visit: Payer: Medicare Other | Admitting: Cardiovascular Disease

## 2015-12-25 ENCOUNTER — Non-Acute Institutional Stay (SKILLED_NURSING_FACILITY): Payer: Medicare Other | Admitting: Internal Medicine

## 2015-12-25 ENCOUNTER — Encounter: Payer: Self-pay | Admitting: Internal Medicine

## 2015-12-25 DIAGNOSIS — I10 Essential (primary) hypertension: Secondary | ICD-10-CM | POA: Diagnosis not present

## 2015-12-25 DIAGNOSIS — E113593 Type 2 diabetes mellitus with proliferative diabetic retinopathy without macular edema, bilateral: Secondary | ICD-10-CM

## 2015-12-25 NOTE — Progress Notes (Signed)
Location:   [facility] Room Number: 104/W Place of Service:  SNF [id number]) Provider:  Leeanne Deed, MD  Patient Care Team: Hendricks Limes, MD as PCP - General (Internal Medicine) Danie Binder, MD as Consulting Physician (Gastroenterology)  Extended Emergency Contact Information Primary Emergency Contact: Dorathy Kinsman States of [place] Phone: [phone] Relation: Daughter Secondary Emergency Contact: [place] of [place] Phone: [phone] Relation: Daughter  Code Status:  DNR Goals of care: Advanced Directive information Advanced Directives 12/25/2015  Does patient have an advance directive? Yes  Type of Advance Directive Out of facility DNR (pink MOST or yellow form)  Does patient want to make changes to advanced directive? No - Patient declined  Copy of advanced directive(s) in chart? Yes  Would patient like information on creating an advanced directive? -     Chief Complaint  Patient presents with  . Acute Visit    B/S    HPI:  Pt is a 81 y.o. male seen today for an acute visit for Follow-up of blood sugars.  Patient's hemoglobin A1c back in May 2017 was 6. 7-3  He is a diet-controlled type II diabetic.  When he was seen for routine visit recently by Dr. Lyndel Safe was noted at times his p.m. blood sugars were 250 range.  There was suggested possibly starting Glucophage or Lantus.  We have been following his blood sugars since then however appears of moderated some his blood sugars are now largely in the mid to higher 100s the last several days.  He continues to be stable clinically has a very good appetite this has improved significantly during his stay here continues to be doing quite well with supportive care.  I did discuss this with Dr. Lyndel Safe and at this point  would be hesitant to add any medication for concerns of hypoglycemia.         Past Medical History:  Diagnosis  Date  . Anemia due to chronic blood loss   . CAD (coronary artery disease)    cath 11/2007: 70% LM ISR, SVG-LAD OK, SVG-D1 100%, IMA-D2 OK, SVG-OM OK, 4-vessel CABG 2000.  Marland Kitchen CAD (coronary artery disease)    s/p of 95% L main artery stenosis, 4/03: cutting balloon PTCA of 80% in-stent restenosis, 7/04. preserved L ventriculr function  . Carotid bruit    bilateral. no sigificant distal abd atherosclerosis by recent cath.   . Chronic bronchitis ([place])   . Constipation   . COPD (chronic obstructive pulmonary disease) (HCC)    ongoing tobacco   . CVA (cerebral infarction)   . DM2 (diabetes mellitus, type 2) ([place])   . Dyslipidemia   . Dysphagia   . Glaucoma   . HTN (hypertension)    Past Surgical History:  Procedure Laterality Date  . CARDIAC CATHETERIZATION    . COLONOSCOPY N/A 05/14/2015   POOR PREP  . COLONOSCOPY N/A 06/02/2015   SLF: 1. four colorectal polyps removed. no source for anemia identified. 2. the left colon is redundant 3. moderate sized internal hemorroids.   . ESOPHAGOGASTRODUODENOSCOPY N/A 05/14/2015   CHRONIC GASTRITIS  . GIVENS CAPSULE STUDY N/A 06/17/2015   Procedure: GIVENS CAPSULE STUDY;  Surgeon: Danie Binder, MD;  [facility];  Service: Endoscopy;  Laterality: N/A;  0800    No Known Allergies Current Outpatient Prescriptions on File Prior to Visit  Medication Sig Dispense Refill  . Calcium Carbonate-Vitamin D (OSCAL 500/200 D-3 PO) 1 Tablet  once a day by mouth . Can't be given with iron    . clopidogrel (PLAVIX) 75 MG tablet Take 75 mg by mouth daily.    Marland Kitchen docusate sodium (COLACE) 100 MG capsule Take 100 mg by mouth 2 (two) times daily.    . ferrous sulfate 325 (65 FE) MG tablet Take 1 tablet (325 mg total) by mouth 3 (three) times daily with meals. 90 tablet 11  . labetalol (NORMODYNE) 200 MG tablet Take 200 mg by mouth 3 (three) times daily. Reported on 10/22/2015    . linaclotide (LINZESS) 72 MCG capsule Take 72 mcg by mouth daily before  breakfast.    . lisinopril (PRINIVIL,ZESTRIL) 30 MG tablet Take 40 mg by mouth daily.     . Multiple Vitamin (MULTIVITAMIN) tablet Take 1 tablet by mouth daily.    . Omega-3 Fatty Acids (FISH OIL) 1000 MG CAPS Take 1,000 mg by mouth 3 (three) times daily.    . pantoprazole (PROTONIX) 40 MG tablet Take 40 mg by mouth daily.    . polyethylene glycol powder (MIRALAX) powder Take 17 g by mouth 2 (two) times daily. 850 g 0  . pravastatin (PRAVACHOL) 80 MG tablet Take 80 mg by mouth daily.    Marland Kitchen senna (SENOKOT) 8.6 MG TABS tablet Take 2 tablets (17.2 mg total) by mouth daily. 120 each 0  . tamsulosin (FLOMAX) 0.4 MG CAPS capsule Take 0.4 mg by mouth daily after supper.     No current facility-administered medications on file prior to visit.     Review of Systems   General -no complaints of fever or chills  Does not complain of rashitching no increased bruising is been noted.  Head ears eyes nose mouth and throat  He is legally blind is not complaining of sore throat no recent nosebleeds.  Respiratory not complaint shortness breath or cough.  Cardiac does not complain of chest pain  GI is not complaining of any abdominal discomfort nausea vomiting diarrhea or constipation currently  Immunization History  Administered Date(s) Administered  . Influenza,inj,Quad PF,36+ Mos 03/07/2015  . PPD Test 03/10/2015, 03/24/2015   Pertinent  Health Maintenance Due  Topic Date Due  . INFLUENZA VACCINE  02/01/2016 (Originally 12/02/2015)  . FOOT EXAM  09/11/2016 (Originally 08/31/1944)  . OPHTHALMOLOGY EXAM  09/11/2016 (Originally 08/31/1944)  . URINE MICROALBUMIN  09/11/2016 (Originally 08/31/1944)  . PNA vac Low Risk Adult (1 of 2 - PCV13) 09/11/2016 (Originally 08/30/2001)  . HEMOGLOBIN A1C  03/13/2016   No flowsheet data found. Functional Status Survey:    Vitals:   12/25/15 1503  BP: (!) 149/70  Pulse: (!) 52  Resp: 20  Temp: 98.2 F (36.8 C)  TempSrc: Oral  SpO2: 96%  Of note I  got a pulse of 60 on physical exam today There is no height or weight on file to calculate BMI. Physical Exam  GENERAL APPEARANCE: The patient is not in any distresssitting comfortably in his wheelchair he is alert  i and continues to be in good spirits t  His skin is warm and dry Eyes-apparently he is legally blind he does apparently have some vision in his right eye but has significant visual deficits--this appears to be baseline.Left eye continues to be opaque Oropharynx clear mucous membranes moist    CHEST/RESPIRATORY: Clear air entry bilaterally. No labored breathing CARDIOVASCULAR:  CARDIAC: . Heart sounds are regular- 2/6 systolic murmur. Marland Kitchen--Minimal lower extremity edema GASTROINTESTINAL:  ABDOMEN: Soft nontender with positive bowel sounds Musculoskeletal-appears to move his extremities  at baseline he ambulates in a wheelchairoften no lateralizing findings in his extremities   NEUROLOGICAL:  CRANIAL NERVES: He does have left facial weakness. This is not new.  Psyche is oriented to self is pleasant and appropriate    Labs reviewed:  Recent Labs  09/11/15 0715 10/23/15 0705 10/31/15 1502  NA 141 137 137  K 4.1 3.7 4.0  CL 108 106 106  CO2 '25 25 27  '$ GLUCOSE 114* 130* 182*  BUN '14 11 18  '$ CREATININE 0.86 0.78 0.91  CALCIUM 9.2 8.7* 9.7    Recent Labs  08/07/15 0715 09/11/15 0715 10/31/15 1502  AST '25 18 17  '$ ALT '30 17 18  '$ ALKPHOS 66 62 82  BILITOT 0.2* 0.2* 0.4  PROT 6.8 7.3 7.9  ALBUMIN 3.5 3.4* 4.0    Recent Labs  10/23/15 0705 10/31/15 1502 12/04/15 0700  WBC 6.8 7.9 7.0  NEUTROABS 3.2 4.5 4.0  HGB 11.3* 12.7* 11.7*  HCT 34.3* 39.3 35.9*  MCV 84.1 85.8 85.1  PLT 259 287 288   Lab Results  Component Value Date   TSH 1.064 10/31/2015   Lab Results  Component Value Date   HGBA1C 6.7 (H) 09/11/2015   Lab Results  Component Value Date   CHOL 119 08/07/2015   HDL 41 08/07/2015   LDLCALC 61  08/07/2015   TRIG 85 08/07/2015   CHOLHDL 2.9 08/07/2015    Significant Diagnostic Results in last 30 days:  No results found.  Assessment/Plan  .#1 diabetes type 2-have reviewed his blood sugars recently morning sugars appear to be In the mid 100 range-evening 160- 190s-as discussed above would be hesitant to start any medication secondary to hypoglycemic concerns at this point will monitor he did have a hemoglobin A1c done back in May which was 6.7 which appears to be satisfactory.  #2 hypertension-I did take a manual blood pressure and got 160/80-at this point will monitor blood pressuresand pulse BID with a log for review he is on labetalol 200 mg 3 times a day as well as lisinopril 40 mg a day-I would like to get more readings before making any medication adjustments-.   [place], [place], [place]

## 2015-12-31 ENCOUNTER — Encounter: Payer: Self-pay | Admitting: Gastroenterology

## 2016-01-01 ENCOUNTER — Encounter: Payer: Self-pay | Admitting: Adult Health

## 2016-01-01 ENCOUNTER — Ambulatory Visit (INDEPENDENT_AMBULATORY_CARE_PROVIDER_SITE_OTHER): Payer: Medicare Other | Admitting: Adult Health

## 2016-01-01 VITALS — BP 148/62 | HR 62 | Wt 179.2 lb

## 2016-01-01 DIAGNOSIS — I10 Essential (primary) hypertension: Secondary | ICD-10-CM

## 2016-01-01 DIAGNOSIS — I2510 Atherosclerotic heart disease of native coronary artery without angina pectoris: Secondary | ICD-10-CM

## 2016-01-01 DIAGNOSIS — I739 Peripheral vascular disease, unspecified: Secondary | ICD-10-CM

## 2016-01-01 DIAGNOSIS — I779 Disorder of arteries and arterioles, unspecified: Secondary | ICD-10-CM

## 2016-01-01 NOTE — Progress Notes (Signed)
Cardiology Office Note   Date:  01/01/2016   ID:  Bristol, Soy Feb 16, 1935, MRN [id number]  PCP:  Unice Cobble, MD  Cardiologist: Woodroe Chen, NP   No chief complaint on file.     History of Present Illness: [name] is a 81 y.o. male who presents for ongoing assessment and management of hypertension, hyperlipidemia,  CAD with prior CABG, and PCI of the Left Main in 2003, carotid artery stenosis, with anxiety and depression, COPD, ongoing tobacco abuse,  and left eye blindness Was last seen by Dr. Bronson Ing in 04/2015 in [facility] office. He was symptomatically stable but a Lexiscan myoview was ordered along with carotid artery doppler studies.   Lexiscan 05/12/2015   T wave inversion was noted during stress.  Defect 1: There is a small defect of mild severity present in the basal inferior location.  This is a low risk study.  The left ventricular ejection fraction is mildly decreased (45-54%).    Carotid Artery Doppler 05/09/2015 IMPRESSION: 1. Complete occlusion of the right common carotid artery again noted. 2. Moderate left carotid bifurcation atherosclerotic vascular disease with degree of stenosis less than 50%. 3. Retrograde flow in the right vertebral artery. No flow noted in the left vertebral artery. Repeat in one year.   He is doing well. Has a great appetite, remains busy as he can be within his physical ability.He denies any symptoms.   Past Medical History:  Diagnosis Date  . Anemia due to chronic blood loss   . CAD (coronary artery disease)    cath 11/2007: 70% LM ISR, SVG-LAD OK, SVG-D1 100%, IMA-D2 OK, SVG-OM OK, 4-vessel CABG 2000.  Marland Kitchen CAD (coronary artery disease)    s/p of 95% L main artery stenosis, 4/03: cutting balloon PTCA of 80% in-stent restenosis, 7/04. preserved L ventriculr function  . Carotid bruit    bilateral. no sigificant distal abd atherosclerosis by recent cath.   . Chronic bronchitis ([place])   . Constipation    . COPD (chronic obstructive pulmonary disease) (HCC)    ongoing tobacco   . CVA (cerebral infarction)   . DM2 (diabetes mellitus, type 2) ([place])   . Dyslipidemia   . Dysphagia   . Glaucoma   . HTN (hypertension)     Past Surgical History:  Procedure Laterality Date  . CARDIAC CATHETERIZATION    . COLONOSCOPY N/A 05/14/2015   POOR PREP  . COLONOSCOPY N/A 06/02/2015   SLF: 1. four colorectal polyps removed. no source for anemia identified. 2. the left colon is redundant 3. moderate sized internal hemorroids.   . ESOPHAGOGASTRODUODENOSCOPY N/A 05/14/2015   CHRONIC GASTRITIS  . GIVENS CAPSULE STUDY N/A 06/17/2015   Procedure: GIVENS CAPSULE STUDY;  Surgeon: Danie Binder, MD;  Location: [facility];  Service: Endoscopy;  Laterality: N/A;  0800     No current outpatient prescriptions on file.   No current facility-administered medications for this visit.     Allergies:   Review of patient's allergies indicates no known allergies.    Social History:  The patient  reports that he quit smoking about 16 months ago. He has never used smokeless tobacco. He reports that he does not drink alcohol or use drugs.   Family History:  The patient's family history includes Hypertension in his mother; Stroke in his maternal grandmother.    ROS: All other systems are reviewed and negative. Unless otherwise mentioned in H&P    PHYSICAL EXAM: VS:  BP (!) 148/62  Pulse 62   Wt 179 lb 3.2 oz (81.3 kg) Comment: 12/02/15  SpO2 97%   BMI 26.46 kg/m  , BMI Body mass index is 26.46 kg/m. GEN: Well nourished, well developed, in no acute distress Sitting in a wheelchair.  HEENT: normal Left eye closed.  Neck: no JVD, carotid bruits, or masses Cardiac: RRR; no murmurs, rubs, or gallops,no edema  Respiratory: Clear to auscultation bilaterally, normal work of breathing GI: soft, nontender, nondistended, + BS MS: no deformity or atrophy  Skin: warm and dry, no rash Neuro:  Strength and sensation  are intact Psych: euthymic mood, full affect   Recent Labs: 10/31/2015: ALT 18; BUN 18; Creatinine, Ser 0.91; Potassium 4.0; Sodium 137; TSH 1.064 12/04/2015: Hemoglobin 11.7; Platelets 288    Lipid Panel    Component Value Date/Time   CHOL 119 08/07/2015 0715   TRIG 85 08/07/2015 0715   HDL 41 08/07/2015 0715   CHOLHDL 2.9 08/07/2015 0715   VLDL 17 08/07/2015 0715   LDLCALC 61 08/07/2015 0715      Wt Readings from Last 3 Encounters:  01/01/16 179 lb 3.2 oz (81.3 kg)  12/18/15 179 lb 3.2 oz (81.3 kg)  12/05/15 169 lb (76.7 kg)      ASSESSMENT AND PLAN:  1.  CAD: He is without cardiac complaints. I will not make any changes at this time. Continue current medication regimen. See him again in 6 months unless symptomatic.   2. Hypertension: BP is well controlled. He is doing well. Recent labs have been reviewed. No changes   3. Carotid Artery Disease: Complete occlusion of the right carotid artery. Will have follow up carotid study in next 5 months.    Current medicines are reviewed at length with the patient today.    Labs/ tests ordered today include:  No orders of the defined types were placed in this encounter.    Disposition:   FU with  6 months Signed, Jory Sims, NP  01/01/2016 4:18 PM    [facility] [address], [place], [place] [address] Phone: [phone]; Fax: [phone]

## 2016-01-01 NOTE — Progress Notes (Signed)
Name: [name]    DOB: [birth year]  Age: 81 y.o.  MR#: [id number]       PCP:  Unice Cobble, MD      Insurance: Payor: [employer]nie Boer MEDICARE / Plan: [facility] MEDICARE / Product Type: *No Product type* /   CC:   No chief complaint on file.   VS Vitals:   01/01/16 1505  BP: (!) 148/62  Pulse: 62  SpO2: 97%  Weight: 179 lb 3.2 oz (81.3 kg)    Weights Current Weight  01/01/16 179 lb 3.2 oz (81.3 kg)  12/18/15 179 lb 3.2 oz (81.3 kg)  12/05/15 169 lb (76.7 kg)    Blood Pressure  BP Readings from Last 3 Encounters:  01/01/16 (!) 148/62  12/25/15 (!) 149/70  12/18/15 (!) 147/62     Admit date:  (Not on file) Last encounter with RMR:  Visit date not found   Allergy Review of patient's allergies indicates no known allergies.  No current outpatient prescriptions on file.   No current facility-administered medications for this visit.     Discontinued Meds:   There are no discontinued medications.  Patient Active Problem List   Diagnosis Date Noted  . DM (diabetes mellitus) with peripheral vascular complication ([place]) 61/60/7371  . Anemia, unspecified 10/22/2015  . Hyperkalemia 03/27/2015  . Anemia due to chronic blood loss 03/26/2015  . Dysphagia 03/09/2015  . Colitis, ischemic ([employer]) 03/07/2015  . Facial droop 03/07/2015  . Malignant hypertension 03/06/2015  . Constipation 03/06/2015  . Anxiety 03/06/2015  . AKI (acute kidney injury) ([place]) 03/06/2015  . Elevated troponin 03/06/2015  . Diabetes mellitus with retinopathy of both eyes ([place]) 03/06/2015  . COPD (chronic obstructive pulmonary disease) ([place]) 11/18/2009  . HLD (hyperlipidemia) 11/18/2009  . NONDEPENDENT TOBACCO USE DISORDER 11/18/2009  . Essential hypertension 11/18/2009  . INTERMEDIATE CORONARY SYNDROME 11/18/2009  . Coronary atherosclerosis 11/18/2009  . PVD 11/18/2009  . CHRONIC AIRWAY OBSTRUCTION NEC 11/18/2009  . PERSONAL HX TIA & CI W/O RESIDUAL DEFICITS 11/18/2009  . POSTSURGICAL AORTOCORONARY  BYPASS STATUS 11/18/2009  . POSTSURG PERCUT TRANSLUMINAL COR ANGPLSTY STS 11/18/2009    LABS    Component Value Date/Time   NA 137 10/31/2015 1502   NA 137 10/23/2015 0705   NA 141 09/11/2015 0715   K 4.0 10/31/2015 1502   K 3.7 10/23/2015 0705   K 4.1 09/11/2015 0715   CL 106 10/31/2015 1502   CL 106 10/23/2015 0705   CL 108 09/11/2015 0715   CO2 27 10/31/2015 1502   CO2 25 10/23/2015 0705   CO2 25 09/11/2015 0715   GLUCOSE 182 (H) 10/31/2015 1502   GLUCOSE 130 (H) 10/23/2015 0705   GLUCOSE 114 (H) 09/11/2015 0715   BUN 18 10/31/2015 1502   BUN 11 10/23/2015 0705   BUN 14 09/11/2015 0715   CREATININE 0.91 10/31/2015 1502   CREATININE 0.78 10/23/2015 0705   CREATININE 0.86 09/11/2015 0715   CALCIUM 9.7 10/31/2015 1502   CALCIUM 8.7 (L) 10/23/2015 0705   CALCIUM 9.2 09/11/2015 0715   GFRNONAA >60 10/31/2015 1502   GFRNONAA >60 10/23/2015 0705   GFRNONAA >60 09/11/2015 0715   GFRAA >60 10/31/2015 1502   GFRAA >60 10/23/2015 0705   GFRAA >60 09/11/2015 0715   CMP     Component Value Date/Time   NA 137 10/31/2015 1502   K 4.0 10/31/2015 1502   CL 106 10/31/2015 1502   CO2 27 10/31/2015 1502   GLUCOSE 182 (H) 10/31/2015 1502   BUN  18 10/31/2015 1502   CREATININE 0.91 10/31/2015 1502   CALCIUM 9.7 10/31/2015 1502   PROT 7.9 10/31/2015 1502   ALBUMIN 4.0 10/31/2015 1502   AST 17 10/31/2015 1502   ALT 18 10/31/2015 1502   ALKPHOS 82 10/31/2015 1502   BILITOT 0.4 10/31/2015 1502   GFRNONAA >60 10/31/2015 1502   GFRAA >60 10/31/2015 1502       Component Value Date/Time   WBC 7.0 12/04/2015 0700   WBC 7.9 10/31/2015 1502   WBC 6.8 10/23/2015 0705   HGB 11.7 (L) 12/04/2015 0700   HGB 12.7 (L) 10/31/2015 1502   HGB 11.3 (L) 10/23/2015 0705   HCT 35.9 (L) 12/04/2015 0700   HCT 39.3 10/31/2015 1502   HCT 34.3 (L) 10/23/2015 0705   MCV 85.1 12/04/2015 0700   MCV 85.8 10/31/2015 1502   MCV 84.1 10/23/2015 0705    Lipid Panel     Component Value Date/Time    CHOL 119 08/07/2015 0715   TRIG 85 08/07/2015 0715   HDL 41 08/07/2015 0715   CHOLHDL 2.9 08/07/2015 0715   VLDL 17 08/07/2015 0715   LDLCALC 61 08/07/2015 0715    ABG    Component Value Date/Time   HCO3 25.3 (H) 07/16/2007 2148   TCO2 29 07/29/2012 1737     Lab Results  Component Value Date   TSH 1.064 10/31/2015   BNP (last 3 results) No results for input(s): BNP in the last 8760 hours.  ProBNP (last 3 results) No results for input(s): PROBNP in the last 8760 hours.  Cardiac Panel (last 3 results) No results for input(s): CKTOTAL, CKMB, TROPONINI, RELINDX in the last 72 hours.  Iron/TIBC/Ferritin/ %Sat    Component Value Date/Time   IRON 27 (L) 11/23/2007 0630   TIBC 363 11/23/2007 0630   FERRITIN 41 12/04/2015 0700   IRONPCTSAT 7 (L) 11/23/2007 0630     EKG Orders placed or performed during the hospital encounter of 04/05/15  . ED EKG  . ED EKG  . EKG 12-Lead  . EKG 12-Lead  . EKG     Prior Assessment and Plan Problem List as of 01/01/2016 Reviewed: 12/25/2015  9:35 PM by Granville Lewis, PA-C     Cardiovascular and Mediastinum   Essential hypertension   Last Assessment & Plan 10/22/2015 Nursing Home Written 10/22/2015  1:21 PM by Hendricks Limes, MD    Adequate control No change in medications BMET      INTERMEDIATE CORONARY SYNDROME   Coronary atherosclerosis   PVD   PERSONAL HX TIA & CI W/O RESIDUAL DEFICITS   POSTSURGICAL AORTOCORONARY BYPASS STATUS   Malignant hypertension   DM (diabetes mellitus) with peripheral vascular complication (HCC)     Respiratory   COPD (chronic obstructive pulmonary disease) ([place])   CHRONIC AIRWAY OBSTRUCTION NEC     Digestive   Constipation   Last Assessment & Plan 09/03/2015 Office Visit Written 09/03/2015  2:35 PM by Danie Binder, MD    SYMPTOMS NOT IDEALLY CONTROLLED ON [place] , SENNA, AND COLACE.   ADD LINZESS 30 MINS PRIOR TO BREAKFAST. IT MAY CAUSE EXPLOSIVE DIARRHEA. DRINK WATER TO KEEP YOUR URINE LIGHT  YELLOW. FOLLOW A HIGH FIBER DIET. AVOID ITEMS THAT CAUSE BLOATING & GAS.  USE MIRALAX 1 SCOOP, COLACE, OR SENNA TWICE DAILY AS NEEDED TO HAVE BOWEL MOVEMENT 2-3 TIMES A WEEK. PLEASE CALL IN TWO WEEKS IF CONSTIPATION IS NOT BETTER OR IF LINZESS CAUSES DIARRHEA. FOLLOW UP IN 6 MOS.  Colitis, ischemic [facility])   Dysphagia   Last Assessment & Plan 09/03/2015 Office Visit Written 09/03/2015  2:36 PM by Danie Binder, MD    SYMPTOMS CONTROLLED/RESOLVED.  CONTINUE TO MONITOR SYMPTOMS.        Endocrine   Diabetes mellitus with retinopathy of both eyes [facility])   Last Assessment & Plan 10/22/2015 Nursing Home Written 10/22/2015  1:23 PM by Hendricks Limes, MD    Repeat A1c mid-August 2017 Continued sugar restriction No medications        Genitourinary   AKI (acute kidney injury) ([place])     Other   HLD (hyperlipidemia)   Last Assessment & Plan 10/22/2015 Nursing Home Written 10/22/2015  1:27 PM by Hendricks Limes, MD    Liver function tests have been normal Fasting lipids will be due in October 2017 Because of advanced age, high-dose statin and polypharmacy; CK monitor is appropriate      NONDEPENDENT TOBACCO USE DISORDER   POSTSURG PERCUT TRANSLUMINAL COR ANGPLSTY STS   Anxiety   Elevated troponin   Facial droop   Anemia due to chronic blood loss   Last Assessment & Plan 09/03/2015 Office Visit Written 09/03/2015  2:34 PM by Danie Binder, MD    DUE TO SMALL BOWEL AVMs. NO WARNING SIGNS/SYMPTOMS. NO BRBPR OR MELENA.  CBC/FERRITIN IN 3 MOS FOLLOW UP IN 6 MOS.       Hyperkalemia   Anemia, unspecified   Last Assessment & Plan 10/22/2015 Nursing Home Written 10/22/2015  1:32 PM by Hendricks Limes, MD    No bleeding dyscrasias but CBC needs to be monitored to rule out progression of anemia          Imaging: No results found.

## 2016-01-01 NOTE — Patient Instructions (Signed)
Your physician wants you to follow-up in: 6 Months with Dr. Koneswaran. You will receive a reminder letter in the mail two months in advance. If you don't receive a letter, please call our office to schedule the follow-up appointment.  Your physician recommends that you continue on your current medications as directed. Please refer to the Current Medication list given to you today.  If you need a refill on your cardiac medications before your next appointment, please call your pharmacy.  Thank you for choosing [facility]!   

## 2016-01-02 ENCOUNTER — Other Ambulatory Visit (HOSPITAL_COMMUNITY)
Admission: AD | Admit: 2016-01-02 | Discharge: 2016-01-02 | Disposition: A | Discharge status: Home / Self Care | Payer: Medicare Other | Source: Skilled Nursing Facility | Attending: Internal Medicine | Admitting: Internal Medicine

## 2016-01-02 DIAGNOSIS — N390 Urinary tract infection, site not specified: Secondary | ICD-10-CM | POA: Diagnosis not present

## 2016-01-02 DIAGNOSIS — N4289 Other specified disorders of prostate: Secondary | ICD-10-CM | POA: Insufficient documentation

## 2016-01-02 LAB — BASIC METABOLIC PANEL
Anion gap: 7 (ref 5–15)
BUN: 10 mg/dL (ref 6–20)
CO2: 26 mmol/L (ref 22–32)
Calcium: 9 mg/dL (ref 8.9–10.3)
Chloride: 108 mmol/L (ref 101–111)
Creatinine, Ser: 0.81 mg/dL (ref 0.61–1.24)
GFR calc Af Amer: >60 mL/min (ref >60)
GFR calc non Af Amer: >60 mL/min (ref >60)
Glucose, Bld: 154 mg/dL — ABNORMAL HIGH (ref 65–99)
Potassium: 3.6 mmol/L (ref 3.5–5.1)
Sodium: 141 mmol/L (ref 135–145)

## 2016-01-03 LAB — HEMOGLOBIN A1C
Hgb A1c MFr Bld: 8 % — ABNORMAL HIGH (ref 4.8–5.6)
Mean Plasma Glucose: 183 mg/dL

## 2016-01-12 ENCOUNTER — Other Ambulatory Visit (HOSPITAL_COMMUNITY)
Admission: RE | Admit: 2016-01-12 | Discharge: 2016-01-12 | Disposition: A | Discharge status: Home / Self Care | Payer: Medicare Other | Source: Skilled Nursing Facility | Attending: Internal Medicine | Admitting: Internal Medicine

## 2016-01-12 DIAGNOSIS — K559 Vascular disorder of intestine, unspecified: Secondary | ICD-10-CM | POA: Diagnosis not present

## 2016-01-13 ENCOUNTER — Non-Acute Institutional Stay (SKILLED_NURSING_FACILITY): Payer: Medicare Other | Admitting: Internal Medicine

## 2016-01-13 ENCOUNTER — Encounter: Payer: Self-pay | Admitting: Internal Medicine

## 2016-01-13 DIAGNOSIS — I10 Essential (primary) hypertension: Secondary | ICD-10-CM | POA: Diagnosis not present

## 2016-01-13 DIAGNOSIS — Z8673 Personal history of transient ischemic attack (TIA), and cerebral infarction without residual deficits: Secondary | ICD-10-CM | POA: Diagnosis not present

## 2016-01-13 DIAGNOSIS — E1151 Type 2 diabetes mellitus with diabetic peripheral angiopathy without gangrene: Secondary | ICD-10-CM | POA: Diagnosis not present

## 2016-01-13 DIAGNOSIS — N179 Acute kidney failure, unspecified: Secondary | ICD-10-CM

## 2016-01-13 DIAGNOSIS — D649 Anemia, unspecified: Secondary | ICD-10-CM | POA: Diagnosis not present

## 2016-01-13 LAB — HEMOGLOBIN A1C
Hgb A1c MFr Bld: 7.9 % — ABNORMAL HIGH (ref 4.8–5.6)
Mean Plasma Glucose: 180 mg/dL

## 2016-01-13 NOTE — Progress Notes (Signed)
Location:   [facility] Room Number: 104/W Place of Service:  SNF [phone]) Provider:  Leeanne Deed, MD  Patient Care Team: Hendricks Limes, MD as PCP - General (Internal Medicine) Danie Binder, MD as Consulting Physician (Gastroenterology)  Extended Emergency Contact Information Primary Emergency Contact: Dorathy Kinsman [place] of [place] Phone: [phone] Relation: Daughter Secondary Emergency Contact: [place] Phone: [phone] Relation: Daughter  Code Status:  DNR Goals of care: Advanced Directive information Advanced Directives 01/13/2016  Does patient have an advance directive? Yes  Type of Advance Directive Out of facility DNR (pink MOST or yellow form)  Does patient want to make changes to advanced directive? No - Patient declined  Copy of advanced directive(s) in chart? Yes  Would patient like information on creating an advanced directive? -     Chief Complaint  Patient presents with  . Medical Management of Chronic Issues    Routine Visit   Including diabetes type 2-hypertension-history CVA-anemia-renal insufficiency- HPI:  Pt is a 81 y.o. male seen today for medical management of chronic diseases. As noted above-he appears to be enjoying a period of relative stability.  We did do a recent hemoglobin A1c which came back rising at 7.9I have reviewed his blood sugars and he does have at times readings are quite variable usually in the higher 100s in the morning and this appears to be the case in evening as well with some 200s more frequent in the evening.  He is currently not on any medication although has been in the past but was discontinued I suspect secondary to relative stability.  He also has a history of significant anemia at one point hemoglobin had dropped down to 9.2 he did have an occult positive stool test he was started on proton pump inhibitor has been seen by GI  they did do a capsule study in February that showed multiple small bowel AVMs thought this may be causing his low iron and low blood counts and recommendation was to continue iron pills indefinitely and this appears to have worked hemoglobin of 11.7 on lab done on 12/04/2015.  On initial admission to the hospital before his stay here he was felt to have abdominal pain secondary to constipation and ischemic colitis this was treated with ciprofloxacin and Flagyl and this has stabilized with no recurrence.  He also had hypertensive urgency but this stabilized as well in the facility he has somewhat variable blood pressures but no consistent elevations recently 144/60-120/64-she is on labetalol 200 mg 3 times a day lisinopril 40 mg a day.  Earlier this year he did have a left facial droop and went to the ER CT scan of the head was negative for any acute process MRI of the brain back on November 2016 did not show any acute infarction did show stable remote infarcts involving the right cerebellum left greater than right basal ganglia brainstem and right greater than left coronal radiata-he continues on Plavix.   Alt one   point he had significant failure to thrive issues with this appears to be in his past current weight of 180.6 shows continued uptake-he is eating and drinking quite well.  He does have some history of renal insufficiency at one point his creatinine did go up to 1.98 he did respond to IV fluids and this is stabilized with creatinine 0.81 on lab done on 01/02/2016.  Currently has no complaints she continues to sit in his  wheelchair comfortably he does not have any complaints today      Past Medical History:  Diagnosis Date  . Anemia due to chronic blood loss   . CAD (coronary artery disease)    cath 11/2007: 70% LM ISR, SVG-LAD OK, SVG-D1 100%, IMA-D2 OK, SVG-OM OK, 4-vessel CABG 2000.  Marland Kitchen CAD (coronary artery disease)    s/p of 95% L main artery stenosis, 4/03: cutting balloon PTCA  of 80% in-stent restenosis, 7/04. preserved L ventriculr function  . Carotid bruit    bilateral. no sigificant distal abd atherosclerosis by recent cath.   . Chronic bronchitis ([place])   . Constipation   . COPD (chronic obstructive pulmonary disease) (HCC)    ongoing tobacco   . CVA (cerebral infarction)   . DM2 (diabetes mellitus, type 2) ([place])   . Dyslipidemia   . Dysphagia   . Glaucoma   . HTN (hypertension)    Past Surgical History:  Procedure Laterality Date  . CARDIAC CATHETERIZATION    . COLONOSCOPY N/A 05/14/2015   POOR PREP  . COLONOSCOPY N/A 06/02/2015   SLF: 1. four colorectal polyps removed. no source for anemia identified. 2. the left colon is redundant 3. moderate sized internal hemorroids.   . ESOPHAGOGASTRODUODENOSCOPY N/A 05/14/2015   CHRONIC GASTRITIS  . GIVENS CAPSULE STUDY N/A 06/17/2015   Procedure: GIVENS CAPSULE STUDY;  Surgeon: Danie Binder, MD;  Location: [facility];  Service: Endoscopy;  Laterality: N/A;  0800    No Known Allergies  Current Outpatient Prescriptions on File Prior to Visit  Medication Sig Dispense Refill  . Calcium Carbonate-Vitamin D (OSCAL 500/200 D-3 PO) 1 Tablet once a day by mouth . Can't be given with iron    . clopidogrel (PLAVIX) 75 MG tablet Take 75 mg by mouth daily.    Marland Kitchen docusate sodium (COLACE) 100 MG capsule Take 100 mg by mouth 2 (two) times daily.    . ferrous sulfate 325 (65 FE) MG tablet Take 1 tablet (325 mg total) by mouth 3 (three) times daily with meals. 90 tablet 11  . labetalol (NORMODYNE) 200 MG tablet Take 200 mg by mouth 3 (three) times daily. Reported on 10/22/2015    . linaclotide (LINZESS) 72 MCG capsule Take 72 mcg by mouth daily before breakfast.    . lisinopril (PRINIVIL,ZESTRIL) 30 MG tablet Take 40 mg by mouth daily.     . Multiple Vitamin (MULTIVITAMIN) tablet Take 1 tablet by mouth daily.    . Omega-3 Fatty Acids (FISH OIL) 1000 MG CAPS Take 1,000 mg by mouth 3 (three) times daily.    . pantoprazole  (PROTONIX) 40 MG tablet Take 40 mg by mouth daily.    . polyethylene glycol powder (MIRALAX) powder Take 17 g by mouth 2 (two) times daily. 850 g 0  . pravastatin (PRAVACHOL) 80 MG tablet Take 80 mg by mouth daily.    Marland Kitchen senna (SENOKOT) 8.6 MG TABS tablet Take 2 tablets (17.2 mg total) by mouth daily. 120 each 0  . tamsulosin (FLOMAX) 0.4 MG CAPS capsule Take 0.4 mg by mouth daily after supper.     No current facility-administered medications on file prior to visit.      Review of Systems  GENERAL:  The patient does not have any specific complaints--per nursing continues to do well Skin does not complain of rashes or itching.   HEENT:   No headache. Appears to be legally blind does have some sight in his right eye apparently  CHEST/RESPIRATORY:  No shortness of breath.   cough or wheezing noted CARDIAC:  No chest pain.   or significant lower extremity edema GI:    no abdominal pain, diarrhea. GU-does not complaining of dysuria   NEUROLOGICAL:  He is not complaining of lateralizing weakness or dizziness   Psych-nursing does not report any issues continues to be pleasant and cooperative -- snacks frequently Immunization History  Administered Date(s) Administered  . Influenza,inj,Quad PF,36+ Mos 03/07/2015  . PPD Test 03/10/2015, 03/24/2015   Pertinent  Health Maintenance Due  Topic Date Due  . INFLUENZA VACCINE  02/01/2016 (Originally 12/02/2015)  . FOOT EXAM  09/11/2016 (Originally 08/31/1944)  . OPHTHALMOLOGY EXAM  09/11/2016 (Originally 08/31/1944)  . URINE MICROALBUMIN  09/11/2016 (Originally 08/31/1944)  . PNA vac Low Risk Adult (1 of 2 - PCV13) 09/11/2016 (Originally 08/30/2001)  . HEMOGLOBIN A1C  07/11/2016   No flowsheet data found. Functional Status Survey:    Vitals:   01/13/16 1457  BP: 120/64  Pulse: 74  Resp: 20  Temp: 98.4 F (36.9 C)  TempSrc: Oral  SpO2: 96%  Weight: 180 lb 9.6 oz (81.9 kg)  Height: '5\' 9"'$  (1.753 m)   Body mass index is 26.67 kg/m. Physical  Exam    GENERAL APPEARANCE:  The patient is not in any distress sitting comfortably in his wheelchair he is alert continues to be  in good spirits t  His skin is warm and dry Eyes-apparently he is legally blind he does apparently have some vision in his right eye but has significant visual --this appears to be baseline. Left eye continues to be opaque Oropharynx clear mucous membranes moist               CHEST/RESPIRATORY:  Clear air entry bilaterally.  No labored breathing  CARDIOVASCULAR:   CARDIAC: .  Heart sounds 2/6 systolic murmur.  Marland Kitchen--Minimal lower extremity edema      GASTROINTESTINAL:   ABDOMEN: Soft nontender with positive bowel sounds    Musculoskeletal-appears to move his extremities at baseline he ambulates in a wheelchairoften no lateralizing findings in his extremities         NEUROLOGICAL:    CRANIAL NERVES:   He does  have left facial weakness.  This is not new.   Otherwise appears to move his extremities at baseline some weakness of his left upper extremity  But this appears to be chronic Psych he appears grossly alert and oriented pleasant and appropriate Labs reviewed:  Recent Labs  10/23/15 0705 10/31/15 1502 01/02/16 0720  NA 137 137 141  K 3.7 4.0 3.6  CL 106 106 108  CO2 '25 27 26  '$ GLUCOSE 130* 182* 154*  BUN '11 18 10  '$ CREATININE 0.78 0.91 0.81  CALCIUM 8.7* 9.7 9.0    Recent Labs  08/07/15 0715 09/11/15 0715 10/31/15 1502  AST '25 18 17  '$ ALT '30 17 18  '$ ALKPHOS 66 62 82  BILITOT 0.2* 0.2* 0.4  PROT 6.8 7.3 7.9  ALBUMIN 3.5 3.4* 4.0    Recent Labs  10/23/15 0705 10/31/15 1502 12/04/15 0700  WBC 6.8 7.9 7.0  NEUTROABS 3.2 4.5 4.0  HGB 11.3* 12.7* 11.7*  HCT 34.3* 39.3 35.9*  MCV 84.1 85.8 85.1  PLT 259 287 288   Lab Results  Component Value Date   TSH 1.064 10/31/2015   Lab Results  Component Value Date   HGBA1C 7.9 (H) 01/12/2016   Lab Results  Component Value Date   CHOL 119 08/07/2015   HDL 41  08/07/2015   LDLCALC  61 08/07/2015   TRIG 85 08/07/2015   CHOLHDL 2.9 08/07/2015    Significant Diagnostic Results in last 30 days:  No results found.  Assessment/Plan       Anemia- He has been seen by GI-hemoglobin appears this stabilized most recently 11.7-he is now on aggressive iron supplementation 3 times a day-capsule study was done again by GI back in February he did find AVMs or recommendation essentially to continue iron supplementation indefinitely-. He also continues on Protonix --Continue to monitor CBGs periodically we will recheck this   #2 renal insufficiency this appears to have stabilized with a creatinine of 0.81 on 01/02/2016.  #3 history of ischemic colitis this has not reoccurred this has been stable again he is gaining weight does not complain of any abdominal pain.  #4-history hypertension As noted above he is now on lisinopril 40 mg a day in addition to labetalol 200 mg 3 times a day-this appears variable but I do not see consistent systolic elevations   #5 diabetes type 2- t Blood sugars appear to be rising hemoglobin A1c is also elevated at 7.9-at one point he had been on Glucophage but this was discontinued secondary to stability we will restart this at 500 mg every morning and monitor  #6 History CVA apparently has had multiple small infarcts in the past-this appears stable he is on Plavix  #7 history COPD this has been essentially asymptomatic at this point will monitor.  #8 weight loss this does not really appear to be an issue anymore he is significantly gaining weight I suspect this is appetite related his edema is minimal he is doing well clinically-Remeron has been discontinued  #9 constipation patient did complain of this to gastroenterologist at an [facility] staff reports she is having pretty regular bowel movements-he is now on Linzess once a day also MiraLAX twice a day when necessary continues on Colace 100 mg twice a day and senna  daily this appears to be stable at this point will monitor.  #10 history hyperlipidemia he is on a statin--lipid panel on April 8 show stability with cholesterol 119 triglycerides 85 HDL 41 LDL 61- liver function tests  on 10/31/2015 appear to be largely within normal limits--of note also continues on fish oil  #11 bradycardia-this appears to be asymptomatic at times he does have pulses in the 50s there are orders to hold his labetalol for any pulse under 60-he does not show any symptomatic weakness syncope at this time at this point will monitor-  [id number]-of note greater than 40 minutes spent assessing patient-reviewing his chart-reviewing his labs discussing his status with nursing staff-and coordinating and formulating a plan of care for numerous diagnoses-of note greater than 50% of time spent coordinating plan of care

## 2016-01-14 ENCOUNTER — Encounter (HOSPITAL_COMMUNITY)
Admission: RE | Admit: 2016-01-14 | Discharge: 2016-01-14 | Disposition: A | Discharge status: Home / Self Care | Payer: Medicare Other | Source: Skilled Nursing Facility | Attending: Internal Medicine | Admitting: Internal Medicine

## 2016-01-14 DIAGNOSIS — D500 Iron deficiency anemia secondary to blood loss (chronic): Secondary | ICD-10-CM | POA: Insufficient documentation

## 2016-01-14 DIAGNOSIS — K559 Vascular disorder of intestine, unspecified: Secondary | ICD-10-CM | POA: Diagnosis not present

## 2016-01-14 DIAGNOSIS — F5089 Other specified eating disorder: Secondary | ICD-10-CM | POA: Insufficient documentation

## 2016-01-14 DIAGNOSIS — N4289 Other specified disorders of prostate: Secondary | ICD-10-CM | POA: Insufficient documentation

## 2016-01-14 DIAGNOSIS — R1312 Dysphagia, oropharyngeal phase: Secondary | ICD-10-CM | POA: Diagnosis not present

## 2016-01-14 LAB — CBC WITH DIFFERENTIAL/PLATELET
Basophils Absolute: 0 10*3/uL (ref 0.0–0.1)
Basophils Relative: 0 %
EOS ABS: 0.4 10*3/uL (ref 0.0–0.7)
EOS PCT: 3 %
HCT: 34.1 % — ABNORMAL LOW (ref 39.0–52.0)
Hemoglobin: 11.2 g/dL — ABNORMAL LOW (ref 13.0–17.0)
LYMPHS ABS: 2.3 10*3/uL (ref 0.7–4.0)
LYMPHS PCT: 19 %
MCH: 27.8 pg (ref 26.0–34.0)
MCHC: 32.8 g/dL (ref 30.0–36.0)
MCV: 84.6 fL (ref 78.0–100.0)
Monocytes Absolute: 1 10*3/uL (ref 0.1–1.0)
Monocytes Relative: 8 %
Neutro Abs: 8.4 10*3/uL — ABNORMAL HIGH (ref 1.7–7.7)
Neutrophils Relative %: 70 %
PLATELETS: 229 10*3/uL (ref 150–400)
RBC: 4.03 MIL/uL — ABNORMAL LOW (ref 4.22–5.81)
RDW: 14.3 % (ref 11.5–15.5)
WBC: 12.2 10*3/uL — ABNORMAL HIGH (ref 4.0–10.5)

## 2016-01-15 ENCOUNTER — Encounter (HOSPITAL_COMMUNITY)
Admission: RE | Admit: 2016-01-15 | Discharge: 2016-01-15 | Disposition: A | Discharge status: Home / Self Care | Payer: Medicare Other | Source: Skilled Nursing Facility | Attending: *Deleted | Admitting: *Deleted

## 2016-01-15 ENCOUNTER — Non-Acute Institutional Stay (SKILLED_NURSING_FACILITY): Payer: Medicare Other | Admitting: Internal Medicine

## 2016-01-15 ENCOUNTER — Encounter: Payer: Self-pay | Admitting: Internal Medicine

## 2016-01-15 DIAGNOSIS — R1312 Dysphagia, oropharyngeal phase: Secondary | ICD-10-CM | POA: Diagnosis not present

## 2016-01-15 DIAGNOSIS — D72829 Elevated white blood cell count, unspecified: Secondary | ICD-10-CM

## 2016-01-15 DIAGNOSIS — R300 Dysuria: Secondary | ICD-10-CM

## 2016-01-15 DIAGNOSIS — N4289 Other specified disorders of prostate: Secondary | ICD-10-CM | POA: Diagnosis not present

## 2016-01-15 DIAGNOSIS — K559 Vascular disorder of intestine, unspecified: Secondary | ICD-10-CM | POA: Diagnosis not present

## 2016-01-15 DIAGNOSIS — D500 Iron deficiency anemia secondary to blood loss (chronic): Secondary | ICD-10-CM | POA: Diagnosis not present

## 2016-01-15 LAB — URINALYSIS, ROUTINE W REFLEX MICROSCOPIC
Bilirubin Urine: NEGATIVE
GLUCOSE, UA: NEGATIVE mg/dL
Ketones, ur: NEGATIVE mg/dL
Nitrite: NEGATIVE
Protein, ur: 100 mg/dL — AB
pH: >9 — ABNORMAL HIGH (ref 5.0–8.0)

## 2016-01-15 LAB — URINE MICROSCOPIC-ADD ON

## 2016-01-15 NOTE — Progress Notes (Signed)
Location:   [facility] Room Number: 104/W Place of Service:  SNF [phone]) Provider:  Leeanne Deed, MD  Patient Care Team: Hendricks Limes, MD as PCP - General (Internal Medicine) Danie Binder, MD as Consulting Physician (Gastroenterology)  Extended Emergency Contact Information Primary Emergency Contact: Dorathy Kinsman [place] of [place] Phone: [phone] Relation: Daughter Secondary Emergency Contact: [place] Phone: [phone] Relation: Daughter  Code Status:  DNR Goals of care: Advanced Directive information Advanced Directives 01/15/2016  Does patient have an advance directive? Yes  Type of Advance Directive Out of facility DNR (pink MOST or yellow form)  Does patient want to make changes to advanced directive? No - Patient declined  Copy of advanced directive(s) in chart? Yes  Would patient like information on creating an advanced directive? -     Chief Complaint  Patient presents with  . Acute Visit    Pain while urinating   As well as elevated white count HPI:  Pt is a 81 y.o. male seen today for an acute visit for for complaints of dysuria and a mildly elevated white count on lab done yesterday was 12.2.  He has been afebrile he's not complaining of fever or chills.  He is prone to urinary tract infections however.   Past Medical History:  Diagnosis Date  . Anemia due to chronic blood loss   . CAD (coronary artery disease)    cath 11/2007: 70% LM ISR, SVG-LAD OK, SVG-D1 100%, IMA-D2 OK, SVG-OM OK, 4-vessel CABG 2000.  Marland Kitchen CAD (coronary artery disease)    s/p of 95% L main artery stenosis, 4/03: cutting balloon PTCA of 80% in-stent restenosis, 7/04. preserved L ventriculr function  . Carotid bruit    bilateral. no sigificant distal abd atherosclerosis by recent cath.   . Chronic bronchitis ([place])   . Constipation   . COPD (chronic obstructive pulmonary disease) (HCC)     ongoing tobacco   . CVA (cerebral infarction)   . DM2 (diabetes mellitus, type 2) ([place])   . Dyslipidemia   . Dysphagia   . Glaucoma   . HTN (hypertension)    Past Surgical History:  Procedure Laterality Date  . CARDIAC CATHETERIZATION    . COLONOSCOPY N/A 05/14/2015   POOR PREP  . COLONOSCOPY N/A 06/02/2015   SLF: 1. four colorectal polyps removed. no source for anemia identified. 2. the left colon is redundant 3. moderate sized internal hemorroids.   . ESOPHAGOGASTRODUODENOSCOPY N/A 05/14/2015   CHRONIC GASTRITIS  . GIVENS CAPSULE STUDY N/A 06/17/2015   Procedure: GIVENS CAPSULE STUDY;  Surgeon: Danie Binder, MD;  [facility];  Service: Endoscopy;  Laterality: N/A;  0800    No Known Allergies  Current Outpatient Prescriptions on File Prior to Visit  Medication Sig Dispense Refill  . Calcium Carbonate-Vitamin D (OSCAL 500/200 D-3 PO) 1 Tablet once a day by mouth . Can't be given with iron    . clopidogrel (PLAVIX) 75 MG tablet Take 75 mg by mouth daily.    Marland Kitchen docusate sodium (COLACE) 100 MG capsule Take 100 mg by mouth 2 (two) times daily.    . ferrous sulfate 325 (65 FE) MG tablet Take 1 tablet (325 mg total) by mouth 3 (three) times daily with meals. 90 tablet 11  . labetalol (NORMODYNE) 200 MG tablet Take 200 mg by mouth 3 (three) times daily. Reported on 10/22/2015    . linaclotide (LINZESS) 72 MCG  capsule Take 72 mcg by mouth daily before breakfast.    . lisinopril (PRINIVIL,ZESTRIL) 30 MG tablet Take 40 mg by mouth daily.     . Multiple Vitamin (MULTIVITAMIN) tablet Take 1 tablet by mouth daily.    . Omega-3 Fatty Acids (FISH OIL) 1000 MG CAPS Take 1,000 mg by mouth 3 (three) times daily.    . pantoprazole (PROTONIX) 40 MG tablet Take 40 mg by mouth daily.    . polyethylene glycol powder (MIRALAX) powder Take 17 g by mouth 2 (two) times daily. 850 g 0  . pravastatin (PRAVACHOL) 80 MG tablet Take 80 mg by mouth daily.    Marland Kitchen senna (SENOKOT) 8.6 MG TABS tablet Take 2  tablets (17.2 mg total) by mouth daily. 120 each 0  . tamsulosin (FLOMAX) 0.4 MG CAPS capsule Take 0.4 mg by mouth daily after supper.     No current facility-administered medications on file prior to visit.      Review of Systems   GENERAL: The patient does not have any specific complaints-other than some pain with urinating does not complain of fever or chills Skin does not complain of rashes or itching.  HEENT: No headache. Appears to be legally blind does have some sight in his right eye apparently CHEST/RESPIRATORY: No shortness of breath.  cough or wheezing noted CARDIAC: No chest pain.  or significant lower extremity edema GI: no abdominal pain, diarrhea. GU-pain at times with urinating as noted above NEUROLOGICAL: He is not complaining of lateralizing weaknessor dizziness   Psych-nursing does not report any issues continues to be pleasant and cooperative -- snacks frequently  Immunization History  Administered Date(s) Administered  . Influenza,inj,Quad PF,36+ Mos 03/07/2015  . PPD Test 03/10/2015, 03/24/2015   Pertinent  Health Maintenance Due  Topic Date Due  . INFLUENZA VACCINE  02/01/2016 (Originally 12/02/2015)  . FOOT EXAM  09/11/2016 (Originally 08/31/1944)  . OPHTHALMOLOGY EXAM  09/11/2016 (Originally 08/31/1944)  . URINE MICROALBUMIN  09/11/2016 (Originally 08/31/1944)  . PNA vac Low Risk Adult (1 of 2 - PCV13) 09/11/2016 (Originally 08/30/2001)  . HEMOGLOBIN A1C  07/11/2016   No flowsheet data found. Functional Status Survey:    He is currently afebrile pulses 70 respirations 18 There is no height or weight on file to calculate BMI. Physical Exam GENERAL APPEARANCE: The patient is not in any distresssitting comfortably in his wheelchair he is alert continues to be  in good spirits t  His skin is warm and dry Eyes-apparently he is legally blind he does apparently have some vision in his right eye but has significant visual --this appears to be  baseline.Left eye continues to be opaque Oropharynx clear mucous membranes moist CHEST/RESPIRATORY: Clear air entry bilaterally. No labored breathing CARDIOVASCULAR:  CARDIAC: . Heart sounds 2/6 systolic murmur. Marland Kitchen--Minimal lower extremity edema GASTROINTESTINAL:  ABDOMEN: Soft nontender with positive bowel sounds Musculoskeletal-appears to move his extremities at baseline he ambulates in a wheelchairoften no lateralizing findings in his extremities  GU-does not appear to have any  CV tenderness possibly some mild suprapubic tenderness to palpation NEUROLOGICAL:  CRANIAL NERVES: He does have left facial weakness. This is not new.  Otherwise appears to move his extremities at baseline some weakness of his left upper extremity  But this appears to be chronic Psych he appears grossly alert and oriented pleasant and appropriate Labs reviewed:    01/14/2016.  WBC 12.2 hemoglobin 11.2 platelets 229  Recent Labs  10/23/15 0705 10/31/15 1502 01/02/16 0720  NA 137 137 141  K 3.7 4.0 3.6  CL 106 106 108  CO2 '25 27 26  '$ GLUCOSE 130* 182* 154*  BUN '11 18 10  '$ CREATININE 0.78 0.91 0.81  CALCIUM 8.7* 9.7 9.0    Recent Labs  08/07/15 0715 09/11/15 0715 10/31/15 1502  AST '25 18 17  '$ ALT '30 17 18  '$ ALKPHOS 66 62 82  BILITOT 0.2* 0.2* 0.4  PROT 6.8 7.3 7.9  ALBUMIN 3.5 3.4* 4.0    Recent Labs  10/31/15 1502 12/04/15 0700 01/14/16 0715  WBC 7.9 7.0 12.2*  NEUTROABS 4.5 4.0 8.4*  HGB 12.7* 11.7* 11.2*  HCT 39.3 35.9* 34.1*  MCV 85.8 85.1 84.6  PLT 287 288 229   Lab Results  Component Value Date   TSH 1.064 10/31/2015   Lab Results  Component Value Date   HGBA1C 7.9 (H) 01/12/2016   Lab Results  Component Value Date   CHOL 119 08/07/2015   HDL 41 08/07/2015   LDLCALC 61 08/07/2015   TRIG 85 08/07/2015   CHOLHDL 2.9 08/07/2015    Significant Diagnostic Results in last 30 days:  No results found.  Assessment/Plan  #1  dysuria with elevated white count-one would be suspicious of UTI will start Cipro 2050 mg twice a day for 7 days empirically as well as a probiotic for 7 days-obtain a urine culture-also update CBC with differential next week to keep an eye on his white count-clinically appears stable but this will have to be watched  [place], [place], [place]

## 2016-01-17 LAB — URINE CULTURE: Culture: >=100000 — AB

## 2016-02-10 DIAGNOSIS — H2511 Age-related nuclear cataract, right eye: Secondary | ICD-10-CM | POA: Diagnosis not present

## 2016-02-10 DIAGNOSIS — H1712 Central corneal opacity, left eye: Secondary | ICD-10-CM | POA: Diagnosis not present

## 2016-02-10 DIAGNOSIS — Z7984 Long term (current) use of oral hypoglycemic drugs: Secondary | ICD-10-CM | POA: Diagnosis not present

## 2016-02-10 DIAGNOSIS — H524 Presbyopia: Secondary | ICD-10-CM | POA: Diagnosis not present

## 2016-02-10 DIAGNOSIS — E113293 Type 2 diabetes mellitus with mild nonproliferative diabetic retinopathy without macular edema, bilateral: Secondary | ICD-10-CM | POA: Diagnosis not present

## 2016-02-10 LAB — HM DIABETES EYE EXAM

## 2016-02-12 ENCOUNTER — Ambulatory Visit (INDEPENDENT_AMBULATORY_CARE_PROVIDER_SITE_OTHER): Payer: Medicare Other | Admitting: Gastroenterology

## 2016-02-12 ENCOUNTER — Encounter: Payer: Self-pay | Admitting: Gastroenterology

## 2016-02-12 DIAGNOSIS — D500 Iron deficiency anemia secondary to blood loss (chronic): Secondary | ICD-10-CM

## 2016-02-12 DIAGNOSIS — K5901 Slow transit constipation: Secondary | ICD-10-CM | POA: Diagnosis not present

## 2016-02-12 NOTE — Progress Notes (Signed)
CC'ED TO PCP 

## 2016-02-12 NOTE — Patient Instructions (Signed)
Continue LINZESS AND SENNA.  CONTINUE PROTONIX. TAKE 30 MINUTES PRIOR TO BREAKFAST.   FOLLOW UP IN 6 MOS. MERRY CHRISTMAS AND HAPPY NEW YEAR!

## 2016-02-12 NOTE — Progress Notes (Signed)
Subjective:    Patient ID: [name], male    DOB: 1934-09-24, 81 y.o.   MRN: [id number] Oneita Kras, MD   HPI Appetite: good. BOWELS: GOOD. C/O RUNNY NOSE AFTER TAKING THE FLU SHOT.  PT DENIES FEVER, CHILLS, HEMATOCHEZIA, HEMATEMESIS, nausea, vomiting, melena, diarrhea, CHEST PAIN, SHORTNESS OF BREATH, CHANGE IN BOWEL IN HABITS, constipation, abdominal pain, problems swallowing, OR heartburn or indigestion.  Past Medical History:  Diagnosis Date  . Anemia due to chronic blood loss   . CAD (coronary artery disease)    cath 11/2007: 70% LM ISR, SVG-LAD OK, SVG-D1 100%, IMA-D2 OK, SVG-OM OK, 4-vessel CABG 2000.  Marland Kitchen CAD (coronary artery disease)    s/p of 95% L main artery stenosis, 4/03: cutting balloon PTCA of 80% in-stent restenosis, 7/04. preserved L ventriculr function  . Carotid bruit    bilateral. no sigificant distal abd atherosclerosis by recent cath.   . Chronic bronchitis ([place])   . Constipation   . COPD (chronic obstructive pulmonary disease) (HCC)    ongoing tobacco   . CVA (cerebral infarction)   . DM2 (diabetes mellitus, type 2) ([place])   . Dyslipidemia   . Dysphagia   . Glaucoma   . HTN (hypertension)     Past Surgical History:  Procedure Laterality Date  . CARDIAC CATHETERIZATION    . COLONOSCOPY N/A 05/14/2015   POOR PREP  . COLONOSCOPY N/A 06/02/2015   SLF: 1. four colorectal polyps removed. no source for anemia identified. 2. the left colon is redundant 3. moderate sized internal hemorroids.   . ESOPHAGOGASTRODUODENOSCOPY N/A 05/14/2015   CHRONIC GASTRITIS  . GIVENS CAPSULE STUDY N/A 06/17/2015   Procedure: GIVENS CAPSULE STUDY;  Surgeon: Danie Binder, MD;  [facility];  Service: Endoscopy;  Laterality: N/A;  0800    No Known Allergies  Outpatient Encounter Prescriptions as of 02/12/2016  Medication Sig  . Calcium Carbonate-Vitamin D (OSCAL 500/200 D-3 PO) 1 Tablet once a day by mouth . Can't be given with iron  . clopidogrel (PLAVIX)  75 MG tablet Take 75 mg by mouth daily.  Marland Kitchen docusate sodium (COLACE) 100 MG capsule Take 100 mg by mouth 2 (two) times daily.  . ferrous sulfate 325 (65 FE) MG tablet Take 1 tablet (325 mg total) by mouth 3 (three) times daily with meals.  Marland Kitchen labetalol (NORMODYNE) 200 MG tablet Take 200 mg by mouth 3 (three) times daily. Reported on 10/22/2015  . linaclotide (LINZESS) 72 MCG capsule Take 72 mcg by mouth daily before breakfast.  . lisinopril (PRINIVIL,ZESTRIL) 30 MG tablet Take 40 mg by mouth daily.   . metFORMIN (GLUCOPHAGE) 500 MG tablet Take 500 mg by mouth daily with breakfast.  . Omega-3 Fatty Acids (FISH OIL) 1000 MG CAPS Take 1,000 mg by mouth 3 (three) times daily.  . pantoprazole (PROTONIX) 40 MG tablet Take 40 mg by mouth daily.  . polyethylene glycol powder (MIRALAX) powder Take 17 g by mouth 2 (two) times daily.  . pravastatin (PRAVACHOL) 80 MG tablet Take 80 mg by mouth daily.  Marland Kitchen senna (SENOKOT) 8.6 MG TABS tablet Take 2 tablets (17.2 mg total) by mouth daily.  . tamsulosin (FLOMAX) 0.4 MG CAPS capsule Take 0.4 mg by mouth daily after supper.  . Multiple Vitamin (MULTIVITAMIN) tablet Take 1 tablet by mouth daily.   Review of Systems PER HPI OTHERWISE ALL SYSTEMS ARE NEGATIVE.    Objective:   Physical Exam  Constitutional: He is oriented to person, place, and time.  He appears well-developed and well-nourished. No distress.  HENT:  Head: Normocephalic and atraumatic.  Mouth/Throat: Oropharynx is clear and moist. No oropharyngeal exudate.  Eyes: Pupils are equal, round, and reactive to light. No scleral icterus.  Neck: Normal range of motion. Neck supple.  Cardiovascular: Normal rate, regular rhythm and normal heart sounds.   Pulmonary/Chest: Effort normal and breath sounds normal. No respiratory distress.  Abdominal: Soft. Bowel sounds are normal. He exhibits no distension. There is no tenderness.  EXAM LIMITED-PT IN [facility]  Musculoskeletal: He exhibits no edema.    Lymphadenopathy:    He has no cervical adenopathy.  Neurological: He is alert and oriented to person, place, and time.  NO  NEW FOCAL DEFICITS  Psychiatric: He has a normal mood and affect.  Vitals reviewed.     Assessment & Plan:

## 2016-02-12 NOTE — Assessment & Plan Note (Signed)
SYMPTOMS CONTROLLED/RESOLVED.  CONTINUE LINZESS AND SENNA.  FOLLOW UP IN 6 MOS.

## 2016-02-12 NOTE — Assessment & Plan Note (Signed)
NO BRBPR OR MELENA. SEP 2017 HB 11.2.  CBC/FERRITIN IN 3 MOS. FOLLOW UP IN 3 MOS.

## 2016-02-13 NOTE — Progress Notes (Signed)
ON RECALL  °

## 2016-02-16 ENCOUNTER — Non-Acute Institutional Stay (SKILLED_NURSING_FACILITY): Payer: Medicare Other | Admitting: Internal Medicine

## 2016-02-16 ENCOUNTER — Encounter: Payer: Self-pay | Admitting: Internal Medicine

## 2016-02-16 DIAGNOSIS — E1151 Type 2 diabetes mellitus with diabetic peripheral angiopathy without gangrene: Secondary | ICD-10-CM | POA: Diagnosis not present

## 2016-02-16 DIAGNOSIS — E785 Hyperlipidemia, unspecified: Secondary | ICD-10-CM

## 2016-02-16 DIAGNOSIS — I10 Essential (primary) hypertension: Secondary | ICD-10-CM

## 2016-02-16 DIAGNOSIS — D500 Iron deficiency anemia secondary to blood loss (chronic): Secondary | ICD-10-CM | POA: Diagnosis not present

## 2016-02-16 NOTE — Progress Notes (Signed)
Location:   [place] Room Number: 104/W Place of Service:  SNF (31) Provider:  Bakary Bramer,Mahrosh Donnell   Patient Care Team:  Danie Binder, MD as Consulting Physician (Gastroenterology) [name], MD as Consulting Physician (Geriatric Medicine)  Extended Emergency Contact Information Primary Emergency Contact: Dorathy Kinsman [place] of [place] Phone: [phone] Relation: Daughter Secondary Emergency Contact: [place] Phone: [phone] Relation: Daughter  Code Status:  DNR Goals of care: Advanced Directive information Advanced Directives 02/16/2016  Does patient have an advance directive? Yes  Type of Advance Directive Out of facility DNR (pink MOST or yellow form)  Does patient want to make changes to advanced directive? No - Patient declined  Copy of advanced directive(s) in chart? Yes  Would patient like information on creating an advanced directive? -     Chief Complaint  Patient presents with  . Medical Management of Chronic Issues    Routine Visit    HPI:  Pt is a 81 y.o. male seen today for medical management of chronic diseases.  Patient has history of Hypertension, Diabetes mellitus type 2, Anemia secondary to chronic GI bleed,. Patient has extensive GI work u and his GI bleed is sec to AVMs. Patient also Has h/o CVA  S/P infarcts involving the right cerebellum left greater than right basal ganglia brainstem and right greater than left coronal radiata-he is on Plavix. Patient continues to do well. He did have high HgA1c of 8  and was started back on his metformin. His BS are running anywhere b/w 130 in the morning to 180 in the evening.  Patient is on multiple Laxatives and is followed by GI . His Hgb is stable at 11.2 in 01/14/16  Patient has been very stable and had no complains. Past Medical History:  Diagnosis Date  . Anemia due to chronic blood loss   . CAD (coronary artery disease)     cath 11/2007: 70% LM ISR, SVG-LAD OK, SVG-D1 100%, IMA-D2 OK, SVG-OM OK, 4-vessel CABG 2000.  Marland Kitchen CAD (coronary artery disease)    s/p of 95% L main artery stenosis, 4/03: cutting balloon PTCA of 80% in-stent restenosis, 7/04. preserved L ventriculr function  . Carotid bruit    bilateral. no sigificant distal abd atherosclerosis by recent cath.   . Chronic bronchitis ([place])   . Constipation   . COPD (chronic obstructive pulmonary disease) (HCC)    ongoing tobacco   . CVA (cerebral infarction)   . DM2 (diabetes mellitus, type 2) ([place])   . Dyslipidemia   . Dysphagia   . Glaucoma   . HTN (hypertension)    Past Surgical History:  Procedure Laterality Date  . CARDIAC CATHETERIZATION    . COLONOSCOPY N/A 05/14/2015   POOR PREP  . COLONOSCOPY N/A 06/02/2015   SLF: 1. four colorectal polyps removed. no source for anemia identified. 2. the left colon is redundant 3. moderate sized internal hemorroids.   . ESOPHAGOGASTRODUODENOSCOPY N/A 05/14/2015   CHRONIC GASTRITIS  . GIVENS CAPSULE STUDY N/A 06/17/2015   Procedure: GIVENS CAPSULE STUDY;  Surgeon: Danie Binder, MD;  [facility];  Service: Endoscopy;  Laterality: N/A;  0800    No Known Allergies  Current Outpatient Prescriptions on File Prior to Visit  Medication Sig Dispense Refill  . Calcium Carbonate-Vitamin D (OSCAL 500/200 D-3 PO) 1 Tablet once a day by mouth . Can't be given with iron    . clopidogrel (PLAVIX) 75 MG tablet Take  75 mg by mouth daily.    Marland Kitchen docusate sodium (COLACE) 100 MG capsule Take 100 mg by mouth 2 (two) times daily.    . ferrous sulfate 325 (65 FE) MG tablet Take 1 tablet (325 mg total) by mouth 3 (three) times daily with meals. 90 tablet 11  . labetalol (NORMODYNE) 200 MG tablet Take 200 mg by mouth 3 (three) times daily. Reported on 10/22/2015    . linaclotide (LINZESS) 72 MCG capsule Take 72 mcg by mouth daily before breakfast.    . lisinopril (PRINIVIL,ZESTRIL) 30 MG tablet Take 40 mg by mouth  daily.     . metFORMIN (GLUCOPHAGE) 500 MG tablet Take 500 mg by mouth daily with breakfast.    . Multiple Vitamin (MULTIVITAMIN) tablet Take 1 tablet by mouth daily.    . Omega-3 Fatty Acids (FISH OIL) 1000 MG CAPS Take 1,000 mg by mouth 3 (three) times daily.    . pantoprazole (PROTONIX) 40 MG tablet Take 40 mg by mouth daily.    . polyethylene glycol powder (MIRALAX) powder Take 17 g by mouth 2 (two) times daily. 850 g 0  . pravastatin (PRAVACHOL) 80 MG tablet Take 80 mg by mouth daily.    Marland Kitchen senna (SENOKOT) 8.6 MG TABS tablet Take 2 tablets (17.2 mg total) by mouth daily. 120 each 0  . tamsulosin (FLOMAX) 0.4 MG CAPS capsule Take 0.4 mg by mouth daily after supper.     No current facility-administered medications on file prior to visit.      Review of Systems  Constitutional: Negative for activity change, appetite change, chills, diaphoresis, fatigue, fever and unexpected weight change.  HENT: Negative for congestion, dental problem, drooling, ear discharge, ear pain and hearing loss.   Respiratory: Negative for apnea, cough, choking, chest tightness, shortness of breath, wheezing and stridor.   Cardiovascular: Negative for chest pain, palpitations and leg swelling.  Gastrointestinal: Negative for abdominal distention, abdominal pain, constipation, diarrhea and nausea.  Musculoskeletal: Negative for arthralgias, back pain, myalgias and neck pain.  Psychiatric/Behavioral: Negative for agitation, behavioral problems, confusion and decreased concentration.    Immunization History  Administered Date(s) Administered  . Influenza,inj,Quad PF,36+ Mos 03/07/2015  . Influenza-Unspecified 02/03/2016  . PPD Test 03/10/2015, 03/24/2015   Pertinent  Health Maintenance Due  Topic Date Due  . FOOT EXAM  09/11/2016 (Originally 08/31/1944)  . URINE MICROALBUMIN  09/11/2016 (Originally 08/31/1944)  . PNA vac Low Risk Adult (1 of 2 - PCV13) 09/11/2016 (Originally 08/30/2001)  . HEMOGLOBIN A1C   07/11/2016  . OPHTHALMOLOGY EXAM  02/09/2017  . INFLUENZA VACCINE  Completed   No flowsheet data found. Functional Status Survey:    Vitals:   02/15/16 0944  BP: (!) 160/52  Pulse: (!) 53  Resp: 18  Temp: 97.9 F (36.6 C)  TempSrc: Oral   There is no height or weight on file to calculate BMI. Physical Exam  Constitutional: He is oriented to person, place, and time. He appears well-developed and well-nourished.  HENT:  Head: Normocephalic.  Mouth/Throat: Oropharynx is clear and moist.  Cardiovascular: Normal rate, regular rhythm and normal heart sounds.   Pulmonary/Chest: Effort normal and breath sounds normal. No respiratory distress. He has no wheezes. He has no rales. He exhibits no tenderness.  Abdominal: Soft. Bowel sounds are normal. He exhibits no distension and no mass. There is no tenderness. There is no rebound and no guarding.  Musculoskeletal: He exhibits no edema.  Neurological: He is alert and oriented to person, place, and time.  But does get confused about certain things.  Good motor strength in Right UE and LE. Slightly reduced strength in left Upper and Lower extremity.    Labs reviewed:  Recent Labs  10/23/15 0705 10/31/15 1502 01/02/16 0720  NA 137 137 141  K 3.7 4.0 3.6  CL 106 106 108  CO2 '25 27 26  '$ GLUCOSE 130* 182* 154*  BUN '11 18 10  '$ CREATININE 0.78 0.91 0.81  CALCIUM 8.7* 9.7 9.0    Recent Labs  08/07/15 0715 09/11/15 0715 10/31/15 1502  AST '25 18 17  '$ ALT '30 17 18  '$ ALKPHOS 66 62 82  BILITOT 0.2* 0.2* 0.4  PROT 6.8 7.3 7.9  ALBUMIN 3.5 3.4* 4.0    Recent Labs  10/31/15 1502 12/04/15 0700 01/14/16 0715  WBC 7.9 7.0 12.2*  NEUTROABS 4.5 4.0 8.4*  HGB 12.7* 11.7* 11.2*  HCT 39.3 35.9* 34.1*  MCV 85.8 85.1 84.6  PLT 287 288 229   Lab Results  Component Value Date   TSH 1.064 10/31/2015   Lab Results  Component Value Date   HGBA1C 7.9 (H) 01/12/2016   Lab Results  Component Value Date   CHOL 119 08/07/2015   HDL  41 08/07/2015   LDLCALC 61 08/07/2015   TRIG 85 08/07/2015   CHOLHDL 2.9 08/07/2015    Significant Diagnostic Results in last 30 days:  No results found.  Assessment/Plan  DM (diabetes mellitus) with peripheral vascular complication  And retinopathy  Patients BS Less then 200 mainly since started on Metformin. Repeat A1C is due in next routine. Will consider adding another agent if still elevated. His goal for his age will be less then 7.5  Essential hypertension  It is hard with few readings to know if his BP is running high. Will monitor it  More closely .   Anemia due to chronic blood loss Hgb is stable at 11.2 Continue Iron supplement. Due for CBC in Nov.  Hyperlipidemia Last LDL was 61. Repeat Labs in next routine. Constipation  Patient stable. Saw GI with no new changes.     Family/ staff Communication:   Labs/tests ordered:  Patient will need repeat Lab work in Dec for HA1C, Lipids, And CBC  With BMP.

## 2016-03-12 ENCOUNTER — Encounter: Payer: Self-pay | Admitting: Internal Medicine

## 2016-03-12 ENCOUNTER — Non-Acute Institutional Stay (SKILLED_NURSING_FACILITY): Payer: Medicare Other | Admitting: Internal Medicine

## 2016-03-12 DIAGNOSIS — E1151 Type 2 diabetes mellitus with diabetic peripheral angiopathy without gangrene: Secondary | ICD-10-CM

## 2016-03-12 DIAGNOSIS — I10 Essential (primary) hypertension: Secondary | ICD-10-CM | POA: Diagnosis not present

## 2016-03-12 DIAGNOSIS — K5901 Slow transit constipation: Secondary | ICD-10-CM

## 2016-03-12 DIAGNOSIS — E785 Hyperlipidemia, unspecified: Secondary | ICD-10-CM | POA: Diagnosis not present

## 2016-03-12 DIAGNOSIS — D500 Iron deficiency anemia secondary to blood loss (chronic): Secondary | ICD-10-CM | POA: Diagnosis not present

## 2016-03-12 DIAGNOSIS — Z8673 Personal history of transient ischemic attack (TIA), and cerebral infarction without residual deficits: Secondary | ICD-10-CM | POA: Diagnosis not present

## 2016-03-12 NOTE — Progress Notes (Signed)
Location:   [facility] Room Number: 104/W Place of Service:  SNF (31) Provider:  Freddi Starr, MD  Patient Care Team: [name], MD as PCP - General (Internal Medicine) Danie Binder, MD as Consulting Physician (Gastroenterology)  Extended Emergency Contact Information Primary Emergency Contact: Dorathy Kinsman [place] of [place] Phone: [phone] Relation: Daughter Secondary Emergency Contact: [place] Phone: [phone] Relation: Daughter  Code Status:  DNR Goals of care: Advanced Directive information Advanced Directives 03/12/2016  Does patient have an advance directive? Yes  Type of Advance Directive Out of facility DNR (pink MOST or yellow form)  Does patient want to make changes to advanced directive? No - Patient declined  Copy of advanced directive(s) in chart? Yes  Would patient like information on creating an advanced directive? -     Chief Complaint  Patient presents with  . Medical Management of Chronic Issues    Routine Visit  Medical management of chronic medical conditions including history of anemia with GI bleed-diabetes type 2-hypertension-hyperlipidemia-constipation  HPI:  Pt is a 81 y.o. male seen today for medical management of chronic diseases. As noted above he continues to enjoy a period of stability.  Nursing staff does not report any acute issues-he is a type II diabetic is on Glucophage 500 mg twice a day blood sugars appear to run in the higher 100s occasionally lower 200s in the morning-at night blood sugars appear to have the same amount of variability-he does not quite a bit which I suspect contributes to higher blood sugars at times most recent hemoglobin A1c was 7.9 back in September we will update this.  He also has a history of GI bleed with AVMs-hemoglobin has been stable at 11.2 he is on aggressive iron supplementation as well as on a proton  pump inhibitor-will update this.  He has been seen by GI in the past.  He does have a history of CVA involving the right cerebellum left greater than right basal ganglia brainstem and right greater than left carotid radiata-- He continues on Plavix.  Patient also was thought to have significant constipation when he saw GI recently and they put him on Linzess he also continues on Colace as well as senna apparently this has not been an issue now.  Currently he is sitting in his wheelchair comfortably has no complaints again he does rsnack next quite often which I believe probably contributed to elevated blood sugars at times.  Regards hypertension he is on lisinopril 40 mg daily as well as labetalol 200 mg 3 times a day blood pressure today is somewhat elevated with systolic in the [id number] to have variable blood pressures within trying to obtain these on a more consistent basis to see what his true trend this I also see a recent blood pressure 137/74 pulse today is in the 60s     Past Medical History:  Diagnosis Date  . Anemia due to chronic blood loss   . CAD (coronary artery disease)    cath 11/2007: 70% LM ISR, SVG-LAD OK, SVG-D1 100%, IMA-D2 OK, SVG-OM OK, 4-vessel CABG 2000.  Marland Kitchen CAD (coronary artery disease)    s/p of 95% L main artery stenosis, 4/03: cutting balloon PTCA of 80% in-stent restenosis, 7/04. preserved L ventriculr function  . Carotid bruit    bilateral. no sigificant distal abd atherosclerosis by recent cath.   . Chronic bronchitis ([place])   . Constipation   .  COPD (chronic obstructive pulmonary disease) (HCC)    ongoing tobacco   . CVA (cerebral infarction)   . DM2 (diabetes mellitus, type 2) ([place])   . Dyslipidemia   . Dysphagia   . Glaucoma   . HTN (hypertension)    Past Surgical History:  Procedure Laterality Date  . CARDIAC CATHETERIZATION    . COLONOSCOPY N/A 05/14/2015   POOR PREP  . COLONOSCOPY N/A 06/02/2015   SLF: 1. four colorectal polyps removed.  no source for anemia identified. 2. the left colon is redundant 3. moderate sized internal hemorroids.   . ESOPHAGOGASTRODUODENOSCOPY N/A 05/14/2015   CHRONIC GASTRITIS  . GIVENS CAPSULE STUDY N/A 06/17/2015   Procedure: GIVENS CAPSULE STUDY;  Surgeon: Danie Binder, MD;  [facility];  Service: Endoscopy;  Laterality: N/A;  0800    No Known Allergies  Current Outpatient Prescriptions on File Prior to Visit  Medication Sig Dispense Refill  . Calcium Carbonate-Vitamin D (OSCAL 500/200 D-3 PO) 1 Tablet once a day by mouth . Can't be given with iron    . clopidogrel (PLAVIX) 75 MG tablet Take 75 mg by mouth daily.    Marland Kitchen docusate sodium (COLACE) 100 MG capsule Take 100 mg by mouth 2 (two) times daily.    . ferrous sulfate 325 (65 FE) MG tablet Take 1 tablet (325 mg total) by mouth 3 (three) times daily with meals. 90 tablet 11  . labetalol (NORMODYNE) 200 MG tablet Take 200 mg by mouth 3 (three) times daily. Reported on 10/22/2015    . linaclotide (LINZESS) 72 MCG capsule Take 72 mcg by mouth daily before breakfast.    . lisinopril (PRINIVIL,ZESTRIL) 30 MG tablet Take 40 mg by mouth daily.     . metFORMIN (GLUCOPHAGE) 500 MG tablet Take 500 mg by mouth daily with breakfast.    . Multiple Vitamin (MULTIVITAMIN) tablet Take 1 tablet by mouth daily.    . Omega-3 Fatty Acids (FISH OIL) 1000 MG CAPS Take 1,000 mg by mouth 3 (three) times daily.    . pantoprazole (PROTONIX) 40 MG tablet Take 40 mg by mouth daily.    . pravastatin (PRAVACHOL) 80 MG tablet Take 80 mg by mouth daily.    Marland Kitchen senna (SENOKOT) 8.6 MG TABS tablet Take 2 tablets (17.2 mg total) by mouth daily. 120 each 0  . tamsulosin (FLOMAX) 0.4 MG CAPS capsule Take 0.4 mg by mouth daily after supper.     No current facility-administered medications on file prior to visit.      Review of Systems \\  In general does not complain of any fever or chills he has gained about 10 pounds since early June suspect this is appetite  related.  Skin does not complain of rashes or itching.  Head ears eyes nose mouth and throat does not complaining sore throat-he is legally blind  Rest very does not complain of shortness breath or cough.  Cardiac no chest pain.  GI is not complaining of any nausea vomiting diarrhea or constipation does have a history of constipation in the past is not complaining of any abdominal discomfort.  Muscle skeletal is not complaining of joint pain.  Neurologic does not complain of dizziness or headache or syncope.  Psych nursing staff does not report any behavior issues continues to be pleasant cooperative  I  Immunization History  Administered Date(s) Administered  . Influenza,inj,Quad PF,36+ Mos 03/07/2015  . Influenza-Unspecified 02/03/2016  . PPD Test 03/10/2015, 03/24/2015  . Pneumococcal-Unspecified 02/10/2016   Pertinent  Health Maintenance Due  Topic Date Due  . FOOT EXAM  09/11/2016 (Originally 08/31/1944)  . URINE MICROALBUMIN  09/11/2016 (Originally 08/31/1944)  . HEMOGLOBIN A1C  07/11/2016  . OPHTHALMOLOGY EXAM  02/09/2017  . PNA vac Low Risk Adult (2 of 2 - PCV13) 02/09/2017  . INFLUENZA VACCINE  Completed   No flowsheet data found. Functional Status Survey:    Vitals:   03/12/16 1048  BP: 137/74  Pulse: (!) 58  Resp: 20  Temp: 98.6 F (37 C)  TempSrc: Oral  SpO2: 97%  Appears to be having gradual weight gain most recently 184 pounds  Physical Exam   GENERAL APPEARANCE: The patient is not in any distresssitting comfortably in his wheelchair he is alert continues to be in good spirits   His skin is warm and dry Eyes-apparently he is legally blind he does apparently have some vision in his right eye but has significant visual --this appears to be baseline.Left eye continues to be opaque Oropharynx clear mucous membranes moist CHEST/RESPIRATORY: Clear air entry bilaterally. No labored breathing CARDIOVASCULAR:  CARDIAC: . Heart  sounds 2/6 systolic murmur. Marland Kitchen--Minimal lower extremity edema GASTROINTESTINAL:  ABDOMEN: Soft nontender with positive bowel sounds Musculoskeletal-appears to move his extremities at baseline he ambulates in a wheelchair--he has some slight left-sided weakness  GU-does not appear to have any  CV tenderness possibly some mild suprapubic tenderness to palpation NEUROLOGICAL:  CRANIAL NERVES: He does have left facial weakness. This is not new.  Otherwise appears to move his extremities at baseline some weakness of his left upper extremity  But this appears to be chronic Psych he appears grossly alert and oriented pleasant and appropriate  Labs reviewed:  Recent Labs  10/23/15 0705 10/31/15 1502 01/02/16 0720  NA 137 137 141  K 3.7 4.0 3.6  CL 106 106 108  CO2 '25 27 26  '$ GLUCOSE 130* 182* 154*  BUN '11 18 10  '$ CREATININE 0.78 0.91 0.81  CALCIUM 8.7* 9.7 9.0    Recent Labs  08/07/15 0715 09/11/15 0715 10/31/15 1502  AST '25 18 17  '$ ALT '30 17 18  '$ ALKPHOS 66 62 82  BILITOT 0.2* 0.2* 0.4  PROT 6.8 7.3 7.9  ALBUMIN 3.5 3.4* 4.0    Recent Labs  10/31/15 1502 12/04/15 0700 01/14/16 0715  WBC 7.9 7.0 12.2*  NEUTROABS 4.5 4.0 8.4*  HGB 12.7* 11.7* 11.2*  HCT 39.3 35.9* 34.1*  MCV 85.8 85.1 84.6  PLT 287 288 229   Lab Results  Component Value Date   TSH 1.064 10/31/2015   Lab Results  Component Value Date   HGBA1C 7.9 (H) 01/12/2016   Lab Results  Component Value Date   CHOL 119 08/07/2015   HDL 41 08/07/2015   LDLCALC 61 08/07/2015   TRIG 85 08/07/2015   CHOLHDL 2.9 08/07/2015    Significant Diagnostic Results in last 30 days:  No results found.  Assessment/Plan  #1 diabetes type 2 again he has somewhat variable blood sugars mainly in the higher 100s average most recent hemoglobin A1c was 7.9 back in early September will update this he is on Glucophage 500 mg twice a day   #2 history of CVA he is on Plavix as well as a statin he appears to  be stable in this regards does quite well with supportive care.-Continues to have some left-sided weakness  #3 history of anemia with GI bleed as noted above has an extensive history here but hemoglobin has been stable on iron and protonix will  update a CBC.  #4 history of hypertension continues on Lisinopril 40 mg daily as well as labetalol 200 mg twice a day more frequent blood pressure checks have been taken but these are difficult apparently to assess today Will assess these when available-continue to do this daily for further assessment this appears to be variable however from the information I been able to obtain  #5 history of hyperlipidemia he is on pravastatin 80 mg a day will update a fasting lipid panel. Last LDL was 61  #6 history of constipation this appears to be resolved essentially on linzess Colace as well as senna.-Per review of GI note 02/12/2016 appear to be doing well in this regard  #7-history of BPH he is on Flomax this appears to be stable as well.  #8 history of failure to thrive patient actually when he came here has significant issues with failure to thrive poor appetite but this has really turned around he is continuing to gain weight eats very well at this point will monitor.  [id number]

## 2016-03-15 ENCOUNTER — Encounter (HOSPITAL_COMMUNITY)
Admission: RE | Admit: 2016-03-15 | Discharge: 2016-03-15 | Disposition: A | Discharge status: Home / Self Care | Payer: Medicare Other | Source: Skilled Nursing Facility | Attending: Internal Medicine | Admitting: Internal Medicine

## 2016-03-15 DIAGNOSIS — N4289 Other specified disorders of prostate: Secondary | ICD-10-CM | POA: Insufficient documentation

## 2016-03-15 DIAGNOSIS — D500 Iron deficiency anemia secondary to blood loss (chronic): Secondary | ICD-10-CM | POA: Insufficient documentation

## 2016-03-15 DIAGNOSIS — K559 Vascular disorder of intestine, unspecified: Secondary | ICD-10-CM | POA: Insufficient documentation

## 2016-03-15 DIAGNOSIS — F5089 Other specified eating disorder: Secondary | ICD-10-CM | POA: Insufficient documentation

## 2016-03-15 LAB — BASIC METABOLIC PANEL
Anion gap: 6 (ref 5–15)
BUN: 14 mg/dL (ref 6–20)
CHLORIDE: 109 mmol/L (ref 101–111)
CO2: 26 mmol/L (ref 22–32)
Calcium: 9 mg/dL (ref 8.9–10.3)
Creatinine, Ser: 0.92 mg/dL (ref 0.61–1.24)
GFR calc Af Amer: >60 mL/min (ref >60)
GFR calc non Af Amer: >60 mL/min (ref >60)
GLUCOSE: 147 mg/dL — AB (ref 65–99)
POTASSIUM: 3.6 mmol/L (ref 3.5–5.1)
Sodium: 141 mmol/L (ref 135–145)

## 2016-03-15 LAB — LIPID PANEL
CHOL/HDL RATIO: 3.8 ratio
CHOLESTEROL: 124 mg/dL (ref 0–200)
HDL: 33 mg/dL — ABNORMAL LOW (ref >40)
LDL Cholesterol: 76 mg/dL (ref 0–99)
Triglycerides: 75 mg/dL (ref <150)
VLDL: 15 mg/dL (ref 0–40)

## 2016-03-15 LAB — CBC
HEMATOCRIT: 35.2 % — AB (ref 39.0–52.0)
HEMOGLOBIN: 11.4 g/dL — AB (ref 13.0–17.0)
MCH: 27.9 pg (ref 26.0–34.0)
MCHC: 32.4 g/dL (ref 30.0–36.0)
MCV: 86.1 fL (ref 78.0–100.0)
Platelets: 286 10*3/uL (ref 150–400)
RBC: 4.09 MIL/uL — AB (ref 4.22–5.81)
RDW: 14.5 % (ref 11.5–15.5)
WBC: 7.7 10*3/uL (ref 4.0–10.5)

## 2016-03-16 LAB — HEMOGLOBIN A1C
Hgb A1c MFr Bld: 8.1 % — ABNORMAL HIGH (ref 4.8–5.6)
Mean Plasma Glucose: 186 mg/dL

## 2016-04-06 DIAGNOSIS — H524 Presbyopia: Secondary | ICD-10-CM | POA: Diagnosis not present

## 2016-04-13 ENCOUNTER — Encounter: Payer: Self-pay | Admitting: Gastroenterology

## 2016-04-22 ENCOUNTER — Encounter: Payer: Self-pay | Admitting: Internal Medicine

## 2016-04-22 ENCOUNTER — Non-Acute Institutional Stay (SKILLED_NURSING_FACILITY): Payer: Medicare Other | Admitting: Internal Medicine

## 2016-04-22 DIAGNOSIS — D500 Iron deficiency anemia secondary to blood loss (chronic): Secondary | ICD-10-CM

## 2016-04-22 DIAGNOSIS — E1151 Type 2 diabetes mellitus with diabetic peripheral angiopathy without gangrene: Secondary | ICD-10-CM | POA: Diagnosis not present

## 2016-04-22 DIAGNOSIS — I10 Essential (primary) hypertension: Secondary | ICD-10-CM | POA: Diagnosis not present

## 2016-04-22 DIAGNOSIS — E785 Hyperlipidemia, unspecified: Secondary | ICD-10-CM

## 2016-04-22 NOTE — Progress Notes (Signed)
Location:   [facility] Room Number: 104/D Place of Service:  SNF (31) Provider:  Clydene Fake, MD  Patient Care Team: [name], MD as PCP - General (Internal Medicine) Danie Binder, MD as Consulting Physician (Gastroenterology)  Extended Emergency Contact Information Primary Emergency Contact: Dorathy Kinsman [place] of [place] Phone: [phone] Relation: Daughter Secondary Emergency Contact: [place] of [place] Phone: [phone] Relation: Daughter  Code Status:  DNR Goals of care: Advanced Directive information Advanced Directives 04/22/2016  Does Patient Have a Medical Advance Directive? Yes  Type of Advance Directive Out of facility DNR (pink MOST or yellow form)  Does patient want to make changes to medical advance directive? No - Patient declined  Copy of [place] in Chart? -  Would patient like information on creating a medical advance directive? -     Chief Complaint  Patient presents with  . Medical Management of Chronic Issues    Routine Visit    HPI:  Pt is a 81 y.o. male seen today for medical management of chronic diseases.   Patient has h/o Diabetes mellitus Type 2, Anemia Secondary to Chronic GI bleed followed by GI H/O CVA  Status post  infarcts involving the right cerebellum left greater than right basal ganglia brainstem and right greater than left coronal radiata-he is on Plavix.  Patient is wheel chair bound and Long term resident of facility. He has been stable mostly since my last visit. His metformin suppose to be increased to  Twice a day but I did not see that order. His BS are mostly running b/w 130 to 240. His last A1C was 8.1 in 11/17 Patient is followed by GI for his Chronic GI bleed and Constipation. He is on Proton Pump inhibitor. And Iron supplement. His Hgb is stable at 11.4 in 11/17. His BP is stable on Lisinopril and Labetalol. His  weight has actually increased few pounds to 186 lbs. Patient had no new complains. He only wanted to know if he can get something to help him sleep.  Past Medical History:  Diagnosis Date  . Anemia due to chronic blood loss   . CAD (coronary artery disease)    cath 11/2007: 70% LM ISR, SVG-LAD OK, SVG-D1 100%, IMA-D2 OK, SVG-OM OK, 4-vessel CABG 2000.  Marland Kitchen CAD (coronary artery disease)    s/p of 95% L main artery stenosis, 4/03: cutting balloon PTCA of 80% in-stent restenosis, 7/04. preserved L ventriculr function  . Carotid bruit    bilateral. no sigificant distal abd atherosclerosis by recent cath.   . Chronic bronchitis ([place])   . Constipation   . COPD (chronic obstructive pulmonary disease) (HCC)    ongoing tobacco   . CVA (cerebral infarction)   . DM2 (diabetes mellitus, type 2) ([facility])   . Dyslipidemia   . Dysphagia   . Glaucoma   . HTN (hypertension)    Past Surgical History:  Procedure Laterality Date  . CARDIAC CATHETERIZATION    . COLONOSCOPY N/A 05/14/2015   POOR PREP  . COLONOSCOPY N/A 06/02/2015   SLF: 1. four colorectal polyps removed. no source for anemia identified. 2. the left colon is redundant 3. moderate sized internal hemorroids.   . ESOPHAGOGASTRODUODENOSCOPY N/A 05/14/2015   CHRONIC GASTRITIS  . GIVENS CAPSULE STUDY N/A 06/17/2015   Procedure: GIVENS CAPSULE STUDY;  Surgeon: Danie Binder, MD;  Location: [facility];  Service: Endoscopy;  Laterality: N/A;  0800    No Known Allergies  Current Outpatient Prescriptions on File Prior to Visit  Medication Sig Dispense Refill  . Calcium Carbonate-Vitamin D (OSCAL 500/200 D-3 PO) 1 Tablet once a day by mouth . Can't be given with iron    . clopidogrel (PLAVIX) 75 MG tablet Take 75 mg by mouth daily.    Marland Kitchen docusate sodium (COLACE) 100 MG capsule Take 100 mg by mouth 2 (two) times daily.    . ferrous sulfate 325 (65 FE) MG tablet Take 1 tablet (325 mg total) by mouth 3 (three) times daily with meals. 90 tablet 11  .  labetalol (NORMODYNE) 200 MG tablet Take 200 mg by mouth 3 (three) times daily. Reported on 10/22/2015    . linaclotide (LINZESS) 72 MCG capsule Take 72 mcg by mouth daily before breakfast.    . lisinopril (PRINIVIL,ZESTRIL) 30 MG tablet Take 40 mg by mouth daily.     . metFORMIN (GLUCOPHAGE) 500 MG tablet Take 500 mg by mouth daily with breakfast.    . Omega-3 Fatty Acids (FISH OIL) 1000 MG CAPS Take 1,000 mg by mouth 3 (three) times daily.    . pantoprazole (PROTONIX) 40 MG tablet Take 40 mg by mouth daily.    . pravastatin (PRAVACHOL) 80 MG tablet Take 80 mg by mouth daily.    Marland Kitchen senna (SENOKOT) 8.6 MG TABS tablet Take 2 tablets (17.2 mg total) by mouth daily. 120 each 0  . tamsulosin (FLOMAX) 0.4 MG CAPS capsule Take 0.4 mg by mouth daily after supper.     No current facility-administered medications on file prior to visit.      Review of SystemsROS Review of Systems  Constitutional: Negative for activity change, appetite change, chills, diaphoresis, fatigue and fever.  HENT: Negative for mouth sores, postnasal drip, rhinorrhea, sinus pain and sore throat.   Respiratory: Negative for apnea, cough, chest tightness, shortness of breath and wheezing.   Cardiovascular: Negative for chest pain, palpitations and leg swelling.  Gastrointestinal: Negative for abdominal distention, abdominal pain, constipation, diarrhea, nausea and vomiting.  Genitourinary: Negative for dysuria and frequency.  Musculoskeletal: Negative for arthralgias, joint swelling and myalgias.  Skin: Negative for rash.  Neurological: Negative for dizziness, syncope, weakness, light-headedness and numbness.  Psychiatric/Behavioral: Negative for behavioral problems, confusion and does have sleep disturbance.   Immunization History  Administered Date(s) Administered  . Influenza,inj,Quad PF,36+ Mos 03/07/2015  . Influenza-Unspecified 02/03/2016  . PPD Test 03/10/2015, 03/24/2015  . Pneumococcal-Unspecified 02/10/2016    Pertinent  Health Maintenance Due  Topic Date Due  . FOOT EXAM  09/11/2016 (Originally 08/31/1944)  . URINE MICROALBUMIN  09/11/2016 (Originally 08/31/1944)  . HEMOGLOBIN A1C  09/12/2016  . OPHTHALMOLOGY EXAM  02/09/2017  . PNA vac Low Risk Adult (2 of 2 - PCV13) 02/09/2017  . INFLUENZA VACCINE  Completed   No flowsheet data found. Functional Status Survey:    Vitals:   04/22/16 1048  BP: 126/63  Pulse: 72  Resp: 19  Temp: 98.6 F (37 C)  TempSrc: Oral   There is no height or weight on file to calculate BMI. Physical Exam  Constitutional: He appears well-developed and well-nourished.  HENT:  Head: Normocephalic.  Mouth/Throat: Oropharynx is clear and moist.  Neck: Neck supple.  Cardiovascular: Normal rate, regular rhythm and normal heart sounds.   No murmur heard. Pulmonary/Chest: Effort normal and breath sounds normal. No respiratory distress. He has no wheezes. He has no rales.  Abdominal: Soft. Bowel sounds are normal. He exhibits no distension.  There is no tenderness. There is no rebound.  Musculoskeletal: He exhibits no edema or tenderness.  Lymphadenopathy:    He has no cervical adenopathy.  Neurological: He is alert.  Had good strength in all extremities.  Skin: No rash noted.  Psychiatric: He has a normal mood and affect. His behavior is normal. Thought content normal.    Labs reviewed:  Recent Labs  10/31/15 1502 01/02/16 0720 03/15/16 1003  NA 137 141 141  K 4.0 3.6 3.6  CL 106 108 109  CO2 '27 26 26  '$ GLUCOSE 182* 154* 147*  BUN '18 10 14  '$ CREATININE 0.91 0.81 0.92  CALCIUM 9.7 9.0 9.0    Recent Labs  08/07/15 0715 09/11/15 0715 10/31/15 1502  AST '25 18 17  '$ ALT '30 17 18  '$ ALKPHOS 66 62 82  BILITOT 0.2* 0.2* 0.4  PROT 6.8 7.3 7.9  ALBUMIN 3.5 3.4* 4.0    Recent Labs  10/31/15 1502 12/04/15 0700 01/14/16 0715 03/15/16 1003  WBC 7.9 7.0 12.2* 7.7  NEUTROABS 4.5 4.0 8.4*  --   HGB 12.7* 11.7* 11.2* 11.4*  HCT 39.3 35.9* 34.1* 35.2*   MCV 85.8 85.1 84.6 86.1  PLT 287 288 229 286   Lab Results  Component Value Date   TSH 1.064 10/31/2015   Lab Results  Component Value Date   HGBA1C 8.1 (H) 03/15/2016   Lab Results  Component Value Date   CHOL 124 03/15/2016   HDL 33 (L) 03/15/2016   LDLCALC 76 03/15/2016   TRIG 75 03/15/2016   CHOLHDL 3.8 03/15/2016    Significant Diagnostic Results in last 30 days:  No results found.  Assessment/Plan  DM (diabetes mellitus) with peripheral vascular complication  And retinopathy His A1C is 8.1. Will increase his Metformin to BID. Continue Accu check. Also Patient due for Urine for Microalbuminuria. He os already on ACE inhibitor.  Essential hypertension BP stable. Continue on Labetalol and Lisinopril  Anemia Due to Chronic Blood loss Patient Hgb stable. Continue Iron supplement.  Hyperlipidemia LDL is 76 Continue on Prevachol  Constipation Stable on Linzess  Insomnia Start him on Melatonin.PRN.  Family/ staff Communication:   Labs/tests ordered:  Patients Lab were reviewed.

## 2016-04-29 DIAGNOSIS — E119 Type 2 diabetes mellitus without complications: Secondary | ICD-10-CM | POA: Diagnosis not present

## 2016-04-29 DIAGNOSIS — I739 Peripheral vascular disease, unspecified: Secondary | ICD-10-CM | POA: Diagnosis not present

## 2016-04-29 DIAGNOSIS — B351 Tinea unguium: Secondary | ICD-10-CM | POA: Diagnosis not present

## 2016-04-29 DIAGNOSIS — I70203 Unspecified atherosclerosis of native arteries of extremities, bilateral legs: Secondary | ICD-10-CM | POA: Diagnosis not present

## 2016-05-13 ENCOUNTER — Encounter: Payer: Self-pay | Admitting: Internal Medicine

## 2016-05-13 ENCOUNTER — Non-Acute Institutional Stay (SKILLED_NURSING_FACILITY): Payer: Medicare Other | Admitting: Internal Medicine

## 2016-05-13 DIAGNOSIS — E785 Hyperlipidemia, unspecified: Secondary | ICD-10-CM

## 2016-05-13 DIAGNOSIS — I2510 Atherosclerotic heart disease of native coronary artery without angina pectoris: Secondary | ICD-10-CM | POA: Diagnosis not present

## 2016-05-13 DIAGNOSIS — Z8673 Personal history of transient ischemic attack (TIA), and cerebral infarction without residual deficits: Secondary | ICD-10-CM

## 2016-05-13 DIAGNOSIS — E113593 Type 2 diabetes mellitus with proliferative diabetic retinopathy without macular edema, bilateral: Secondary | ICD-10-CM

## 2016-05-13 DIAGNOSIS — I10 Essential (primary) hypertension: Secondary | ICD-10-CM

## 2016-05-13 NOTE — Progress Notes (Signed)
Location:   [facility] Room Number: 104/W Place of Service:  SNF (31) Provider:  Freddi Starr, MD  Patient Care Team: [name], MD as PCP - General (Internal Medicine) Danie Binder, MD as Consulting Physician (Gastroenterology)  Extended Emergency Contact Information Primary Emergency Contact: Dorathy Kinsman [place] of [place] Phone: [phone] Relation: Daughter Secondary Emergency Contact: [place] of [place] Phone: [phone] Relation: Daughter  Code Status:  DNR Goals of care: Advanced Directive information Advanced Directives 05/13/2016  Does Patient Have a Medical Advance Directive? Yes  Type of Advance Directive Out of facility DNR (pink MOST or yellow form)  Does patient want to make changes to medical advance directive? No - Patient declined  Copy of [place] in Chart? -  Would patient like information on creating a medical advance directive? -     Chief Complaint  Patient presents with  . Medical Management of Chronic Issues    Routine Visit  For medical management of chronic medical issues including anemia with history of GI bleed-diabetes 2-history CVA-hypertension-constipation-hyperlipidemia-BPH-failure to thrive  HPI:  Pt is a 82 y.o. male seen today for medical management of chronic diseases. --He continues to have an extended period of stability   He is status post CVA of the right cerebellum left greater than right basal ganglia brainstem and right greater than left coronal radiata--he continues on Plavix and a statin he has done very well with supportive care.  At first on admission there were concerns of failure to thrive but this is certainly not been the case over a period of time he has had significant weight gain appears to have stabilized at 185 eats very well appears to be doing quite well with supportive care.  He is a type II diabetic his  Glucophage is currently twice a day he is also on an ACE inhibitor blood sugars appear to be relatively stable recently in the lower 100s in the morning and appears to be a bit more variable later in the day more mid to higher 100s it appears his hemoglobin A1c  e was 8.1 in November 2017-at that point it appears his Glucophage was increased and again this appears to be improved.  He does have a history of hypertension he is on lisinopril  40 mg a day and labetalol 200 mg 3 times a day appear she has somewhat elevated systolics I got 937/16 manually I see listed 153/81-173/71-I also see 126/63 although again systolics appear to be somewhat elevated consistently-  He recently was started on melatonin as needed for insomnia he is not complaining of insomnia today.  He does have a history of GI bleed with anemia and has been followed by GI--he is on iron as well as Protonix and hemoglobin has been stable now for some time most recently 11.4 on lab done in November 2017.  He also has a history of coronary artery disease with complete occlusion of the right common carotid artery noted on study done last January-moderate left carotid bifurcation arthrosclerotic vascular disease with stenosis less than 50%  There is recommendation for cardiology follow-up here early this year per review of cardiology note in August 2017 He continues to be asymptomatic of any chest pain or discomfort in this regards.  At one point he was thought to be constipation and started on when sassy's also on Colace as appears to have resolved.  Currently he is sitting in his  wheelchair comfortably has no complaints he is legally blind but again does quite well with supportive care   Past Medical History:  Diagnosis Date  . Anemia due to chronic blood loss   . CAD (coronary artery disease)    cath 11/2007: 70% LM ISR, SVG-LAD OK, SVG-D1 100%, IMA-D2 OK, SVG-OM OK, 4-vessel CABG 2000.  Marland Kitchen CAD (coronary artery disease)    s/p  of 95% L main artery stenosis, 4/03: cutting balloon PTCA of 80% in-stent restenosis, 7/04. preserved L ventriculr function  . Carotid bruit    bilateral. no sigificant distal abd atherosclerosis by recent cath.   . Chronic bronchitis ([place])   . Constipation   . COPD (chronic obstructive pulmonary disease) (HCC)    ongoing tobacco   . CVA (cerebral infarction)   . DM2 (diabetes mellitus, type 2) ([place])   . Dyslipidemia   . Dysphagia   . Glaucoma   . HTN (hypertension)    Past Surgical History:  Procedure Laterality Date  . CARDIAC CATHETERIZATION    . COLONOSCOPY N/A 05/14/2015   POOR PREP  . COLONOSCOPY N/A 06/02/2015   SLF: 1. four colorectal polyps removed. no source for anemia identified. 2. the left colon is redundant 3. moderate sized internal hemorroids.   . ESOPHAGOGASTRODUODENOSCOPY N/A 05/14/2015   CHRONIC GASTRITIS  . GIVENS CAPSULE STUDY N/A 06/17/2015   Procedure: GIVENS CAPSULE STUDY;  Surgeon: Danie Binder, MD;  [facility];  Service: Endoscopy;  Laterality: N/A;  0800    No Known Allergies  Current Outpatient Prescriptions on File Prior to Visit  Medication Sig Dispense Refill  . Calcium Carbonate-Vitamin D (OSCAL 500/200 D-3 PO) 1 Tablet once a day by mouth . Can't be given with iron    . clopidogrel (PLAVIX) 75 MG tablet Take 75 mg by mouth daily.    Marland Kitchen docusate sodium (COLACE) 100 MG capsule Take 100 mg by mouth 2 (two) times daily.    . ferrous sulfate 325 (65 FE) MG tablet Take 1 tablet (325 mg total) by mouth 3 (three) times daily with meals. 90 tablet 11  . labetalol (NORMODYNE) 200 MG tablet Take 200 mg by mouth 3 (three) times daily. Reported on 10/22/2015    . linaclotide (LINZESS) 72 MCG capsule Take 72 mcg by mouth daily before breakfast.    . lisinopril (PRINIVIL,ZESTRIL) 30 MG tablet Take 40 mg by mouth daily.     . metFORMIN (GLUCOPHAGE) 500 MG tablet Take 500 mg by mouth 2 (two) times daily with a meal.     . Omega-3 Fatty Acids (FISH OIL)  1000 MG CAPS Take 1,000 mg by mouth 3 (three) times daily.    . pantoprazole (PROTONIX) 40 MG tablet Take 40 mg by mouth daily.    . pravastatin (PRAVACHOL) 80 MG tablet Take 80 mg by mouth daily.    Marland Kitchen senna (SENOKOT) 8.6 MG TABS tablet Take 2 tablets (17.2 mg total) by mouth daily. 120 each 0  . tamsulosin (FLOMAX) 0.4 MG CAPS capsule Take 0.4 mg by mouth daily after supper.     No current facility-administered medications on file prior to visit.      Review of Systems   In general does not complain of any fever or chills has hadt weight gain which appears to have stabilized  Skin does not complain of rashes or itching.  Head ears eyes nose mouth and throat does not complaining sore throat-he is legally blind  Resp-- does not complain of shortness breath  or cough.  Cardiac no chest pain. Or significant lower extremity edema  GI is not complaining of any nausea vomiting diarrhea or constipation does have a history of constipation in the past is not complaining of any abdominal discomfort.  Muscle skeletal is not complaining of joint pain.  Neurologic does not complain of dizziness or headache or syncope.  Psych nursing staff does not report any behavior issues continues to be pleasant cooperative  I   Immunization History  Administered Date(s) Administered  . Influenza,inj,Quad PF,36+ Mos 03/07/2015  . Influenza-Unspecified 02/03/2016  . PPD Test 03/10/2015, 03/24/2015  . Pneumococcal-Unspecified 02/10/2016   Pertinent  Health Maintenance Due  Topic Date Due  . FOOT EXAM  09/11/2016 (Originally 08/31/1944)  . URINE MICROALBUMIN  09/11/2016 (Originally 08/31/1944)  . HEMOGLOBIN A1C  09/12/2016  . OPHTHALMOLOGY EXAM  02/09/2017  . PNA vac Low Risk Adult (2 of 2 - PCV13) 02/09/2017  . INFLUENZA VACCINE  Completed   No flowsheet data found. Functional Status Survey:    Vitals:   05/13/16 1611  BP: (!) 173/71  Pulse: (!) 55  Resp: 20  Temp: 98.6 F (37 C)   TempSrc: Oral  SpO2: 99%  Of note manual blood pressure today was 150/64 Weight is 185 this appears to be stable Physical Exam  GENERAL APPEARANCE: The patient is not in any distresssitting comfortably in his wheelchair he is alert continues to be in good spirits   His skin is warm and dry Eyes-apparently he is legally blind he does apparently have some vision in his right eye but has significant visual --this appears to be baseline.Left eye continues to be opaque Oropharynx clear mucous membranes moist CHEST/RESPIRATORY: Clear air entry bilaterally. No labored breathing CARDIOVASCULAR:  CARDIAC: . Heart sounds are regular slightly bradycardic in the 94R --2/6 systolic murmur. .--scant  lower extremity edema GASTROINTESTINAL:  ABDOMEN: Soft nontender with positive bowel sounds Musculoskeletal-appears to move his extremities at baseline he ambulates in a wheelchair--he has some slight left-sided weakness    NEUROLOGICAL:  CRANIAL NERVES: He does have left facial weakness. This is not new.  Otherwise appears to move his extremities at baseline some weakness of his left upper extremity  But this appears to be chronic Psych he appears grossly alert and oriented pleasant and appropriate  Labs reviewed:  Recent Labs  10/31/15 1502 01/02/16 0720 03/15/16 1003  NA 137 141 141  K 4.0 3.6 3.6  CL 106 108 109  CO2 '27 26 26  '$ GLUCOSE 182* 154* 147*  BUN '18 10 14  '$ CREATININE 0.91 0.81 0.92  CALCIUM 9.7 9.0 9.0    Recent Labs  08/07/15 0715 09/11/15 0715 10/31/15 1502  AST '25 18 17  '$ ALT '30 17 18  '$ ALKPHOS 66 62 82  BILITOT 0.2* 0.2* 0.4  PROT 6.8 7.3 7.9  ALBUMIN 3.5 3.4* 4.0    Recent Labs  10/31/15 1502 12/04/15 0700 01/14/16 0715 03/15/16 1003  WBC 7.9 7.0 12.2* 7.7  NEUTROABS 4.5 4.0 8.4*  --   HGB 12.7* 11.7* 11.2* 11.4*  HCT 39.3 35.9* 34.1* 35.2*  MCV 85.8 85.1 84.6 86.1  PLT 287 288 229 286   Lab Results    Component Value Date   TSH 1.064 10/31/2015   Lab Results  Component Value Date   HGBA1C 8.1 (H) 03/15/2016   Lab Results  Component Value Date   CHOL 124 03/15/2016   HDL 33 (L) 03/15/2016   LDLCALC 76 03/15/2016   TRIG 75 03/15/2016  CHOLHDL 3.8 03/15/2016    Significant Diagnostic Results in last 30 days:  No results found.  Assessment/Plan  #1 history CVA--he continues on Plavix as well as a statin he appears to be stable in this regard doing well with supportive care continues to ablate largely in a wheelchair.  #2 anemia secondary to GI bleed AVM malformations he has been followed by GI he is on iron and per Connie's and hemoglobin has been stable now for some time most recently 11.4 on lab done November 2017 will update a CBC to ensure stability.  #3 diabetes type 2 he is on Glucophage twice a day now as well as an ACE inhibitor hemoglobin A1c actually rose up a little at 8.1 on lab done in November 2017-blood sugars appear to be more stable now as noted above largely in the 100s range at this point will monitor.  #4-hypertension he is on lisinopril 40 mg a day and labetalol 200 mg 3 times a day-systolics appear to be somewhat elevated in fact he had a recent systolic in the 366Q I got a 150 on exam today Will add low dose Norvasc 2.5 mg a day check blood pressures daily with a log for review next week.  #5 history hyperlipidemia LDL on recent lab was 76 he is on a statin. Is also on Fish well  #6 history BPH this appears relatively stable he is on Flomax.  #7 history of constipation this appears stabilized on Linzessas well as Colace. And Senokot  #8 insomnia he is on melatonin when necessary he is not complaining of any insomnia today nursing staff does not report this either.  #9 history of failure to thrive this certainly does not appear to be the case anymore his weight is stable now at 185 continue supportive care he's done very well with this during his stay  here.  [id number] note greater than 35 minutes spent assessing patient-reviewing his chart-reviewing his labs-and coordinating and formulating a plan of care for numerous diagnoses-of note greater than 50% of time spent coordinating a plan of care

## 2016-05-14 ENCOUNTER — Encounter (HOSPITAL_COMMUNITY)
Admission: RE | Admit: 2016-05-14 | Discharge: 2016-05-14 | Disposition: A | Discharge status: Home / Self Care | Payer: Medicare Other | Source: Skilled Nursing Facility | Attending: Internal Medicine | Admitting: Internal Medicine

## 2016-05-14 DIAGNOSIS — I10 Essential (primary) hypertension: Secondary | ICD-10-CM | POA: Diagnosis not present

## 2016-05-14 DIAGNOSIS — N4289 Other specified disorders of prostate: Secondary | ICD-10-CM | POA: Insufficient documentation

## 2016-05-14 DIAGNOSIS — F5089 Other specified eating disorder: Secondary | ICD-10-CM | POA: Diagnosis present

## 2016-05-14 DIAGNOSIS — K559 Vascular disorder of intestine, unspecified: Secondary | ICD-10-CM | POA: Insufficient documentation

## 2016-05-14 DIAGNOSIS — D500 Iron deficiency anemia secondary to blood loss (chronic): Secondary | ICD-10-CM | POA: Insufficient documentation

## 2016-05-14 DIAGNOSIS — R1312 Dysphagia, oropharyngeal phase: Secondary | ICD-10-CM | POA: Insufficient documentation

## 2016-05-14 LAB — BASIC METABOLIC PANEL
ANION GAP: 7 (ref 5–15)
BUN: 10 mg/dL (ref 6–20)
CALCIUM: 9.1 mg/dL (ref 8.9–10.3)
CO2: 25 mmol/L (ref 22–32)
Chloride: 105 mmol/L (ref 101–111)
Creatinine, Ser: 0.83 mg/dL (ref 0.61–1.24)
GFR calc Af Amer: >60 mL/min (ref >60)
Glucose, Bld: 93 mg/dL (ref 65–99)
Potassium: 3.7 mmol/L (ref 3.5–5.1)
Sodium: 137 mmol/L (ref 135–145)

## 2016-05-14 LAB — CBC
HCT: 34.3 % — ABNORMAL LOW (ref 39.0–52.0)
Hemoglobin: 11.7 g/dL — ABNORMAL LOW (ref 13.0–17.0)
MCH: 28.7 pg (ref 26.0–34.0)
MCHC: 34.1 g/dL (ref 30.0–36.0)
MCV: 84.3 fL (ref 78.0–100.0)
PLATELETS: 376 10*3/uL (ref 150–400)
RBC: 4.07 MIL/uL — ABNORMAL LOW (ref 4.22–5.81)
RDW: 14.1 % (ref 11.5–15.5)
WBC: 7.4 10*3/uL (ref 4.0–10.5)

## 2016-06-03 ENCOUNTER — Ambulatory Visit (INDEPENDENT_AMBULATORY_CARE_PROVIDER_SITE_OTHER): Payer: Medicare Other | Admitting: Gastroenterology

## 2016-06-03 ENCOUNTER — Encounter: Payer: Self-pay | Admitting: Gastroenterology

## 2016-06-03 ENCOUNTER — Other Ambulatory Visit: Payer: Self-pay

## 2016-06-03 VITALS — BP 172/84 | HR 58 | Temp 98.2°F | Ht 69.0 in

## 2016-06-03 DIAGNOSIS — D500 Iron deficiency anemia secondary to blood loss (chronic): Secondary | ICD-10-CM

## 2016-06-03 DIAGNOSIS — K5900 Constipation, unspecified: Secondary | ICD-10-CM | POA: Diagnosis not present

## 2016-06-03 NOTE — Assessment & Plan Note (Signed)
No overt GI bleeding. Hemoglobin has remained stable. Consider CBC/ferritin in 3 months, return office visit in 6 months.

## 2016-06-03 NOTE — Progress Notes (Signed)
cc'ed to pcp °

## 2016-06-03 NOTE — Progress Notes (Signed)
Lab orders on file for 08/31/2016.

## 2016-06-03 NOTE — Assessment & Plan Note (Signed)
Symptoms well controlled on current regimen. Return to the office in 6 months or sooner if needed.

## 2016-06-03 NOTE — Progress Notes (Addendum)
Primary Care Physician: [name], MD  Primary Gastroenterologist:  Barney Drain, MD   Chief Complaint  Patient presents with  . Anemia    doing ok    HPI: [name] is a 82 y.o. male here for follow-up visit. Last seen October 2017. History of constipation, anemia due to chronic blood loss from small bowel AVMs. Recent hemoglobin stable at 11.7. Reviewed nursing home office visit note from 05/13/2016. According to that note patient has been thriving, weight gain noted. Constipation stable on current regimen.  Patient states his bowel movements are regular. He denies any blood in his stool. He states his appetite is good. No abdominal pain. No heartburn.  Current Outpatient Prescriptions on File Prior to Visit  Medication Sig Dispense Refill  . Calcium Carbonate-Vitamin D (OSCAL 500/200 D-3 PO) 1 Tablet once a day by mouth . Can't be given with iron    . clopidogrel (PLAVIX) 75 MG tablet Take 75 mg by mouth daily.    Marland Kitchen docusate sodium (COLACE) 100 MG capsule Take 100 mg by mouth 2 (two) times daily.    . ferrous sulfate 325 (65 FE) MG tablet Take 1 tablet (325 mg total) by mouth 3 (three) times daily with meals. 90 tablet 11  . labetalol (NORMODYNE) 200 MG tablet Take 200 mg by mouth 3 (three) times daily. Reported on 10/22/2015    . linaclotide (LINZESS) 72 MCG capsule Take 72 mcg by mouth daily before breakfast.    . lisinopril (PRINIVIL,ZESTRIL) 30 MG tablet Take 40 mg by mouth daily.     . Melatonin 5 MG TABS Take 5 mg by mouth at bedtime as needed.    . metFORMIN (GLUCOPHAGE) 500 MG tablet Take 500 mg by mouth 2 (two) times daily with a meal.     . Omega-3 Fatty Acids (FISH OIL) 1000 MG CAPS Take 1,000 mg by mouth 3 (three) times daily.    . pantoprazole (PROTONIX) 40 MG tablet Take 40 mg by mouth daily.    . pravastatin (PRAVACHOL) 80 MG tablet Take 80 mg by mouth daily.    . Saline (DEEP SEA NASAL SPRAY NA) 1 sprat each nostril twice a day    . senna (SENOKOT)  8.6 MG TABS tablet Take 2 tablets (17.2 mg total) by mouth daily. 120 each 0  . tamsulosin (FLOMAX) 0.4 MG CAPS capsule Take 0.4 mg by mouth daily after supper.     No current facility-administered medications on file prior to visit.      Allergies as of 06/03/2016  . (No Known Allergies)    ROS:  General: Negative for anorexia, weight loss, fever, chills, fatigue, weakness. ENT: Negative for hoarseness, difficulty swallowing , nasal congestion. CV: Negative for chest pain, angina, palpitations, dyspnea on exertion, peripheral edema.  Respiratory: Negative for dyspnea at rest, dyspnea on exertion, cough, sputum, wheezing.  GI: See history of present illness. GU:  Negative for dysuria, hematuria, urinary incontinence, urinary frequency, nocturnal urination.  Endo: Negative for unusual weight change.    Physical Examination:   BP (!) 172/84 (BP Location: Left Arm, Cuff Size: Normal)   Pulse (!) 58   Temp 98.2 F (36.8 C) (Oral)   Ht '5\' 9"'$  (1.753 m)   General: Well-nourished, well-developed in no acute distress.  Eyes: No icterus. Mouth: Oropharyngeal mucosa moist and pink , no lesions erythema or exudate. Lungs: Clear to auscultation bilaterally.  Heart: Regular rate and rhythm, no murmurs rubs or gallops.  Abdomen:  Bowel sounds are normal, nontender, nondistended, no hepatosplenomegaly or masses, no abdominal bruits or hernia , no rebound or guarding.   Extremities: No lower extremity edema. No clubbing or deformities. Neuro: Alert and oriented x 4   Skin: Warm and dry, no jaundice.   Psych: Alert and cooperative, normal mood and affect.  Labs:  Lab Results  Component Value Date   CREATININE 0.83 05/14/2016   BUN 10 05/14/2016   NA 137 05/14/2016   K 3.7 05/14/2016   CL 105 05/14/2016   CO2 25 05/14/2016   Lab Results  Component Value Date   WBC 7.4 05/14/2016   HGB 11.7 (L) 05/14/2016   HCT 34.3 (L) 05/14/2016   MCV 84.3 05/14/2016   PLT 376 05/14/2016     Imaging Studies: No results found.

## 2016-06-03 NOTE — Progress Notes (Signed)
Can we plan for CBC and ferritin in 3 months.

## 2016-06-03 NOTE — Patient Instructions (Signed)
1. Continue current bowel regimen and oral iron.  2. Consider checking CBC every 3 months to document stability of anemia.  3. Return to the office in six months or sooner if needed.

## 2016-06-15 DIAGNOSIS — R1312 Dysphagia, oropharyngeal phase: Secondary | ICD-10-CM | POA: Diagnosis not present

## 2016-06-16 DIAGNOSIS — R1312 Dysphagia, oropharyngeal phase: Secondary | ICD-10-CM | POA: Diagnosis not present

## 2016-06-17 DIAGNOSIS — R1312 Dysphagia, oropharyngeal phase: Secondary | ICD-10-CM | POA: Diagnosis not present

## 2016-06-18 DIAGNOSIS — R1312 Dysphagia, oropharyngeal phase: Secondary | ICD-10-CM | POA: Diagnosis not present

## 2016-06-21 DIAGNOSIS — R1312 Dysphagia, oropharyngeal phase: Secondary | ICD-10-CM | POA: Diagnosis not present

## 2016-06-22 ENCOUNTER — Encounter: Payer: Self-pay | Admitting: Internal Medicine

## 2016-06-22 ENCOUNTER — Non-Acute Institutional Stay (SKILLED_NURSING_FACILITY): Payer: Medicare Other | Admitting: Internal Medicine

## 2016-06-22 DIAGNOSIS — I2510 Atherosclerotic heart disease of native coronary artery without angina pectoris: Secondary | ICD-10-CM | POA: Diagnosis not present

## 2016-06-22 DIAGNOSIS — I10 Essential (primary) hypertension: Secondary | ICD-10-CM

## 2016-06-22 DIAGNOSIS — Z8673 Personal history of transient ischemic attack (TIA), and cerebral infarction without residual deficits: Secondary | ICD-10-CM

## 2016-06-22 DIAGNOSIS — K5900 Constipation, unspecified: Secondary | ICD-10-CM

## 2016-06-22 DIAGNOSIS — E1151 Type 2 diabetes mellitus with diabetic peripheral angiopathy without gangrene: Secondary | ICD-10-CM | POA: Diagnosis not present

## 2016-06-22 DIAGNOSIS — R1312 Dysphagia, oropharyngeal phase: Secondary | ICD-10-CM | POA: Diagnosis not present

## 2016-06-22 DIAGNOSIS — D500 Iron deficiency anemia secondary to blood loss (chronic): Secondary | ICD-10-CM

## 2016-06-22 NOTE — Progress Notes (Signed)
Location:   [facility] Room Number: 104/W Place of Service:  SNF [phone]) Provider:  Clydene Fake, MD  Patient Care Team: [name], MD as PCP - General (Internal Medicine) Danie Binder, MD as Consulting Physician (Gastroenterology)  Extended Emergency Contact Information Primary Emergency Contact: Dorathy Kinsman [place] of [place] Phone: [phone] Relation: Daughter Secondary Emergency Contact: [place] of [place] Phone: [phone] Relation: Daughter  Code Status:  DNR Goals of care: Advanced Directive information Advanced Directives 06/22/2016  Does Patient Have a Medical Advance Directive? Yes  Type of Advance Directive Out of facility DNR (pink MOST or yellow form)  Does patient want to make changes to medical advance directive? No - Patient declined  Copy of [place] in Chart? -  Would patient like information on creating a medical advance directive? -     Chief Complaint  Patient presents with  . Medical Management of Chronic Issues    Routine Visit    HPI:  Pt is a 82 y.o. male seen today for medical management of chronic diseases.   Patient has H/O GI bleed, Diabetes type 2 S/P CVA , Hypertension, Hyperlipidemia, constipation. Poor vision . CAD S/P CABG in 2000 and PCI of Left Main on 2003 and Carotid artery stenosis.with complete occlusion of RCA.  Patient is doing well in facility. Has no complains today . His appetite is good. His weight is stable at 184 lbs.His accu checks show BS are less then 200. His Last A1C was 8.1 and his Glucophage was increased in dose. He has also seen by Gastroenterology and treated for constipation and follow up for GI bleed. I also reviewed his chart from Cardiologist where he needs follow up with Cardiology for Carotid Stenosis follow up.   Past Medical History:  Diagnosis Date  . Anemia due to chronic blood loss   . CAD  (coronary artery disease)    cath 11/2007: 70% LM ISR, SVG-LAD OK, SVG-D1 100%, IMA-D2 OK, SVG-OM OK, 4-vessel CABG 2000.  Marland Kitchen CAD (coronary artery disease)    s/p of 95% L main artery stenosis, 4/03: cutting balloon PTCA of 80% in-stent restenosis, 7/04. preserved L ventriculr function  . Carotid bruit    bilateral. no sigificant distal abd atherosclerosis by recent cath.   . Chronic bronchitis ([place])   . Constipation   . COPD (chronic obstructive pulmonary disease) (HCC)    ongoing tobacco   . CVA (cerebral infarction)   . DM2 (diabetes mellitus, type 2) ([place])   . Dyslipidemia   . Dysphagia   . Glaucoma   . HTN (hypertension)    Past Surgical History:  Procedure Laterality Date  . CARDIAC CATHETERIZATION    . COLONOSCOPY N/A 05/14/2015   POOR PREP  . COLONOSCOPY N/A 06/02/2015   SLF: 1. four colorectal polyps removed. no source for anemia identified. 2. the left colon is redundant 3. moderate sized internal hemorroids.   . ESOPHAGOGASTRODUODENOSCOPY N/A 05/14/2015   CHRONIC GASTRITIS  . GIVENS CAPSULE STUDY N/A 06/17/2015   Procedure: GIVENS CAPSULE STUDY;  Surgeon: Danie Binder, MD;  Location: [facility];  Service: Endoscopy;  Laterality: N/A;  0800    No Known Allergies  Current Outpatient Prescriptions on File Prior to Visit  Medication Sig Dispense Refill  . amLODipine (NORVASC) 2.5 MG tablet Take 2.5 mg by mouth daily.    . Calcium Carbonate-Vitamin D (OSCAL 500/200 D-3 PO) 1 Tablet once a  day by mouth . Can't be given with iron    . clopidogrel (PLAVIX) 75 MG tablet Take 75 mg by mouth daily.    Marland Kitchen docusate sodium (COLACE) 100 MG capsule Take 100 mg by mouth 2 (two) times daily.    . ferrous sulfate 325 (65 FE) MG tablet Take 1 tablet (325 mg total) by mouth 3 (three) times daily with meals. 90 tablet 11  . labetalol (NORMODYNE) 200 MG tablet Take 200 mg by mouth 3 (three) times daily. Reported on 10/22/2015    . linaclotide (LINZESS) 72 MCG capsule Take 72 mcg by mouth  daily before breakfast.    . Melatonin 5 MG TABS Take 5 mg by mouth at bedtime as needed.    . metFORMIN (GLUCOPHAGE) 500 MG tablet Take 500 mg by mouth 2 (two) times daily with a meal.     . Multiple Vitamin (MULTIVITAMIN) tablet Take 1 tablet by mouth daily.    . Omega-3 Fatty Acids (FISH OIL) 1000 MG CAPS Take 1,000 mg by mouth 3 (three) times daily.    . pantoprazole (PROTONIX) 40 MG tablet Take 40 mg by mouth daily.    . pravastatin (PRAVACHOL) 80 MG tablet Take 80 mg by mouth daily.    . Saline (DEEP SEA NASAL SPRAY NA) 1 sprat each nostril twice a day    . senna (SENOKOT) 8.6 MG TABS tablet Take 2 tablets (17.2 mg total) by mouth daily. 120 each 0  . tamsulosin (FLOMAX) 0.4 MG CAPS capsule Take 0.4 mg by mouth daily after supper.     No current facility-administered medications on file prior to visit.      Review of Systems  Review of Systems  Constitutional: Negative for activity change, appetite change, chills, diaphoresis, fatigue and fever.  HENT: Negative for mouth sores, postnasal drip, rhinorrhea, sinus pain and sore throat.   Respiratory: Negative for apnea, cough, chest tightness, shortness of breath and wheezing.   Cardiovascular: Negative for chest pain, palpitations and leg swelling.  Gastrointestinal: Negative for abdominal distention, abdominal pain, constipation, diarrhea, nausea and vomiting.  Genitourinary: Negative for dysuria and frequency.  Musculoskeletal: Negative for arthralgias, joint swelling and myalgias.  Skin: Negative for rash.  Neurological: Negative for dizziness, syncope, weakness, light-headedness and numbness.  Psychiatric/Behavioral: Negative for behavioral problems, confusion and sleep disturbance.     Immunization History  Administered Date(s) Administered  . Influenza,inj,Quad PF,36+ Mos 03/07/2015  . Influenza-Unspecified 02/03/2016  . PPD Test 03/10/2015, 03/24/2015  . Pneumococcal-Unspecified 02/10/2016   Pertinent  Health  Maintenance Due  Topic Date Due  . FOOT EXAM  09/11/2016 (Originally 08/31/1944)  . URINE MICROALBUMIN  09/11/2016 (Originally 08/31/1944)  . HEMOGLOBIN A1C  09/12/2016  . OPHTHALMOLOGY EXAM  02/09/2017  . PNA vac Low Risk Adult (2 of 2 - PCV13) 02/09/2017  . INFLUENZA VACCINE  Completed   No flowsheet data found. Functional Status Survey:    Vitals:   06/22/16 0912  BP: (!) 161/71 Repeat is 138/70  Pulse: 62  Resp: 18  Temp: 98 F (36.7 C)  TempSrc: Oral  SpO2: 95%   There is no height or weight on file to calculate BMI. Physical Exam  Constitutional: He appears well-developed and well-nourished.  HENT:  Head: Normocephalic.  Mouth/Throat: Oropharynx is clear and moist.  Eyes: Right eye exhibits no discharge. Left eye exhibits no discharge.  Neck: Normal range of motion. Neck supple.  Cardiovascular: Normal rate and regular rhythm.   Pulmonary/Chest: Effort normal and breath sounds normal. No  respiratory distress. He has no wheezes. He has no rales.  Abdominal: Soft. Bowel sounds are normal. He exhibits no distension. There is no tenderness. There is no rebound.  Musculoskeletal: He exhibits no edema.  Neurological: He is alert.  Skin: Skin is warm and dry. No erythema.  Psychiatric: He has a normal mood and affect. His behavior is normal. Thought content normal.    Labs reviewed:  Recent Labs  01/02/16 0720 03/15/16 1003 05/14/16 0700  NA 141 141 137  K 3.6 3.6 3.7  CL 108 109 105  CO2 '26 26 25  '$ GLUCOSE 154* 147* 93  BUN '10 14 10  '$ CREATININE 0.81 0.92 0.83  CALCIUM 9.0 9.0 9.1    Recent Labs  08/07/15 0715 09/11/15 0715 10/31/15 1502  AST '25 18 17  '$ ALT '30 17 18  '$ ALKPHOS 66 62 82  BILITOT 0.2* 0.2* 0.4  PROT 6.8 7.3 7.9  ALBUMIN 3.5 3.4* 4.0    Recent Labs  10/31/15 1502 12/04/15 0700 01/14/16 0715 03/15/16 1003 05/14/16 0700  WBC 7.9 7.0 12.2* 7.7 7.4  NEUTROABS 4.5 4.0 8.4*  --   --   HGB 12.7* 11.7* 11.2* 11.4* 11.7*  HCT 39.3 35.9*  34.1* 35.2* 34.3*  MCV 85.8 85.1 84.6 86.1 84.3  PLT 287 288 229 286 376   Lab Results  Component Value Date   TSH 1.064 10/31/2015   Lab Results  Component Value Date   HGBA1C 8.1 (H) 03/15/2016   Lab Results  Component Value Date   CHOL 124 03/15/2016   HDL 33 (L) 03/15/2016   LDLCALC 76 03/15/2016   TRIG 75 03/15/2016   CHOLHDL 3.8 03/15/2016    Significant Diagnostic Results in last 30 days:  No results found.  Assessment/Plan  DM (diabetes mellitus) with peripheral vascular complication (HCC) His BS are better controlled. Will repeat Urine for Microalbuminuria Repot A1C. Continue Accu check. CAD He is on Plavix. On beta blocker and statin. He is also on ACE inhibitor He does need follow with Cardiology as indicated by their recommendation..  Essential hypertension BP stable on Labetalol, ACE inhibitor and Amlodipine.  Constipation On Linzess Doing well  Anemia Follows with GI Ferritin and CBC due in April. HGB stable H/O CVA Stable on Plavix. Hyperlipidemia LDL in 11/17 was 28 Family/ staff Communication:   Labs/tests ordered:

## 2016-06-23 DIAGNOSIS — R1312 Dysphagia, oropharyngeal phase: Secondary | ICD-10-CM | POA: Diagnosis not present

## 2016-06-24 DIAGNOSIS — R1312 Dysphagia, oropharyngeal phase: Secondary | ICD-10-CM | POA: Diagnosis not present

## 2016-06-25 ENCOUNTER — Encounter (HOSPITAL_COMMUNITY)
Admission: RE | Admit: 2016-06-25 | Discharge: 2016-06-25 | Disposition: A | Discharge status: Home / Self Care | Payer: Medicare Other | Source: Skilled Nursing Facility | Attending: Internal Medicine | Admitting: Internal Medicine

## 2016-06-25 DIAGNOSIS — N4289 Other specified disorders of prostate: Secondary | ICD-10-CM | POA: Diagnosis not present

## 2016-06-25 DIAGNOSIS — R1312 Dysphagia, oropharyngeal phase: Secondary | ICD-10-CM | POA: Diagnosis not present

## 2016-06-25 DIAGNOSIS — D500 Iron deficiency anemia secondary to blood loss (chronic): Secondary | ICD-10-CM | POA: Diagnosis not present

## 2016-06-25 DIAGNOSIS — K559 Vascular disorder of intestine, unspecified: Secondary | ICD-10-CM | POA: Diagnosis not present

## 2016-06-25 DIAGNOSIS — F5089 Other specified eating disorder: Secondary | ICD-10-CM | POA: Insufficient documentation

## 2016-06-25 LAB — ALBUMIN: Albumin: 3.9 g/dL (ref 3.5–5.0)

## 2016-06-26 LAB — HEMOGLOBIN A1C
HEMOGLOBIN A1C: 6.8 % — AB (ref 4.8–5.6)
MEAN PLASMA GLUCOSE: 148 mg/dL

## 2016-06-26 LAB — MICROALBUMIN, URINE: Microalb, Ur: 11.4 ug/mL — ABNORMAL HIGH

## 2016-06-28 DIAGNOSIS — R1312 Dysphagia, oropharyngeal phase: Secondary | ICD-10-CM | POA: Diagnosis not present

## 2016-06-29 DIAGNOSIS — R1312 Dysphagia, oropharyngeal phase: Secondary | ICD-10-CM | POA: Diagnosis not present

## 2016-06-30 DIAGNOSIS — R1312 Dysphagia, oropharyngeal phase: Secondary | ICD-10-CM | POA: Diagnosis not present

## 2016-07-01 DIAGNOSIS — R1312 Dysphagia, oropharyngeal phase: Secondary | ICD-10-CM | POA: Diagnosis not present

## 2016-07-01 DIAGNOSIS — I639 Cerebral infarction, unspecified: Secondary | ICD-10-CM | POA: Diagnosis not present

## 2016-07-01 DIAGNOSIS — R279 Unspecified lack of coordination: Secondary | ICD-10-CM | POA: Diagnosis not present

## 2016-07-01 DIAGNOSIS — M6281 Muscle weakness (generalized): Secondary | ICD-10-CM | POA: Diagnosis not present

## 2016-07-02 DIAGNOSIS — R279 Unspecified lack of coordination: Secondary | ICD-10-CM | POA: Diagnosis not present

## 2016-07-02 DIAGNOSIS — R1312 Dysphagia, oropharyngeal phase: Secondary | ICD-10-CM | POA: Diagnosis not present

## 2016-07-02 DIAGNOSIS — M6281 Muscle weakness (generalized): Secondary | ICD-10-CM | POA: Diagnosis not present

## 2016-07-02 DIAGNOSIS — I639 Cerebral infarction, unspecified: Secondary | ICD-10-CM | POA: Diagnosis not present

## 2016-07-05 DIAGNOSIS — M6281 Muscle weakness (generalized): Secondary | ICD-10-CM | POA: Diagnosis not present

## 2016-07-05 DIAGNOSIS — I639 Cerebral infarction, unspecified: Secondary | ICD-10-CM | POA: Diagnosis not present

## 2016-07-05 DIAGNOSIS — R1312 Dysphagia, oropharyngeal phase: Secondary | ICD-10-CM | POA: Diagnosis not present

## 2016-07-05 DIAGNOSIS — R279 Unspecified lack of coordination: Secondary | ICD-10-CM | POA: Diagnosis not present

## 2016-07-06 DIAGNOSIS — R1312 Dysphagia, oropharyngeal phase: Secondary | ICD-10-CM | POA: Diagnosis not present

## 2016-07-06 DIAGNOSIS — R279 Unspecified lack of coordination: Secondary | ICD-10-CM | POA: Diagnosis not present

## 2016-07-06 DIAGNOSIS — M6281 Muscle weakness (generalized): Secondary | ICD-10-CM | POA: Diagnosis not present

## 2016-07-06 DIAGNOSIS — I639 Cerebral infarction, unspecified: Secondary | ICD-10-CM | POA: Diagnosis not present

## 2016-07-07 DIAGNOSIS — R1312 Dysphagia, oropharyngeal phase: Secondary | ICD-10-CM | POA: Diagnosis not present

## 2016-07-07 DIAGNOSIS — I639 Cerebral infarction, unspecified: Secondary | ICD-10-CM | POA: Diagnosis not present

## 2016-07-07 DIAGNOSIS — R279 Unspecified lack of coordination: Secondary | ICD-10-CM | POA: Diagnosis not present

## 2016-07-07 DIAGNOSIS — M6281 Muscle weakness (generalized): Secondary | ICD-10-CM | POA: Diagnosis not present

## 2016-07-08 DIAGNOSIS — M6281 Muscle weakness (generalized): Secondary | ICD-10-CM | POA: Diagnosis not present

## 2016-07-08 DIAGNOSIS — R279 Unspecified lack of coordination: Secondary | ICD-10-CM | POA: Diagnosis not present

## 2016-07-08 DIAGNOSIS — I639 Cerebral infarction, unspecified: Secondary | ICD-10-CM | POA: Diagnosis not present

## 2016-07-08 DIAGNOSIS — R1312 Dysphagia, oropharyngeal phase: Secondary | ICD-10-CM | POA: Diagnosis not present

## 2016-07-09 DIAGNOSIS — I639 Cerebral infarction, unspecified: Secondary | ICD-10-CM | POA: Diagnosis not present

## 2016-07-09 DIAGNOSIS — R279 Unspecified lack of coordination: Secondary | ICD-10-CM | POA: Diagnosis not present

## 2016-07-09 DIAGNOSIS — R1312 Dysphagia, oropharyngeal phase: Secondary | ICD-10-CM | POA: Diagnosis not present

## 2016-07-09 DIAGNOSIS — M6281 Muscle weakness (generalized): Secondary | ICD-10-CM | POA: Diagnosis not present

## 2016-07-12 ENCOUNTER — Encounter: Payer: Self-pay | Admitting: Internal Medicine

## 2016-07-12 DIAGNOSIS — R279 Unspecified lack of coordination: Secondary | ICD-10-CM | POA: Diagnosis not present

## 2016-07-12 DIAGNOSIS — M6281 Muscle weakness (generalized): Secondary | ICD-10-CM | POA: Diagnosis not present

## 2016-07-12 DIAGNOSIS — I639 Cerebral infarction, unspecified: Secondary | ICD-10-CM | POA: Diagnosis not present

## 2016-07-12 DIAGNOSIS — R1312 Dysphagia, oropharyngeal phase: Secondary | ICD-10-CM | POA: Diagnosis not present

## 2016-07-12 NOTE — Progress Notes (Signed)
Location:   [facility] Room Number: 104/W Place of Service:  SNF (31) Provider:  Freddi Starr, MD  Patient Care Team: [name], MD as PCP - General (Internal Medicine) Danie Binder, MD as Consulting Physician (Gastroenterology)  Extended Emergency Contact Information Primary Emergency Contact: Dorathy Kinsman States of [place] Phone: [phone] Relation: Daughter Secondary Emergency Contact: [place] of [place] Phone: [phone] Relation: Daughter  Code Status:  DNR Goals of care: Advanced Directive information Advanced Directives 07/12/2016  Does Patient Have a Medical Advance Directive? Yes  Type of Advance Directive Out of facility DNR (pink MOST or yellow form)  Does patient want to make changes to medical advance directive? No - Patient declined  Copy of [place] in Chart? -  Would patient like information on creating a medical advance directive? -     Chief Complaint  Patient presents with  . Medical Management of Chronic Issues    Routine Visit    HPI:  Pt is a 82 y.o. male seen today for medical management of chronic diseases.     Past Medical History:  Diagnosis Date  . Anemia due to chronic blood loss   . CAD (coronary artery disease)    cath 11/2007: 70% LM ISR, SVG-LAD OK, SVG-D1 100%, IMA-D2 OK, SVG-OM OK, 4-vessel CABG 2000.  Marland Kitchen CAD (coronary artery disease)    s/p of 95% L main artery stenosis, 4/03: cutting balloon PTCA of 80% in-stent restenosis, 7/04. preserved L ventriculr function  . Carotid bruit    bilateral. no sigificant distal abd atherosclerosis by recent cath.   . Chronic bronchitis ([place])   . Constipation   . COPD (chronic obstructive pulmonary disease) (HCC)    ongoing tobacco   . CVA (cerebral infarction)   . DM2 (diabetes mellitus, type 2) ([place])   . Dyslipidemia   . Dysphagia   . Glaucoma   . HTN (hypertension)    Past  Surgical History:  Procedure Laterality Date  . CARDIAC CATHETERIZATION    . COLONOSCOPY N/A 05/14/2015   POOR PREP  . COLONOSCOPY N/A 06/02/2015   SLF: 1. four colorectal polyps removed. no source for anemia identified. 2. the left colon is redundant 3. moderate sized internal hemorroids.   . ESOPHAGOGASTRODUODENOSCOPY N/A 05/14/2015   CHRONIC GASTRITIS  . GIVENS CAPSULE STUDY N/A 06/17/2015   Procedure: GIVENS CAPSULE STUDY;  Surgeon: Danie Binder, MD;  [facility];  Service: Endoscopy;  Laterality: N/A;  0800    No Known Allergies  Current Outpatient Prescriptions on File Prior to Visit  Medication Sig Dispense Refill  . amLODipine (NORVASC) 2.5 MG tablet Take 2.5 mg by mouth daily.    . Calcium Carbonate-Vitamin D (OSCAL 500/200 D-3 PO) 1 Tablet once a day by mouth . Can't be given with iron    . clopidogrel (PLAVIX) 75 MG tablet Take 75 mg by mouth daily.    Marland Kitchen docusate sodium (COLACE) 100 MG capsule Take 100 mg by mouth 2 (two) times daily.    . ferrous sulfate 325 (65 FE) MG tablet Take 1 tablet (325 mg total) by mouth 3 (three) times daily with meals. 90 tablet 11  . labetalol (NORMODYNE) 200 MG tablet Take 200 mg by mouth 3 (three) times daily. Reported on 10/22/2015    . linaclotide (LINZESS) 72 MCG capsule Take 72 mcg by mouth daily before breakfast.    . lisinopril (PRINIVIL,ZESTRIL) 40 MG  tablet Take 40 mg by mouth daily.    . Melatonin 5 MG TABS Take 5 mg by mouth at bedtime as needed.    . metFORMIN (GLUCOPHAGE) 500 MG tablet Take 500 mg by mouth 2 (two) times daily with a meal.     . Multiple Vitamin (MULTIVITAMIN) tablet Take 1 tablet by mouth daily.    . Omega-3 Fatty Acids (FISH OIL) 1000 MG CAPS Take 1,000 mg by mouth 3 (three) times daily.    . pantoprazole (PROTONIX) 40 MG tablet Take 40 mg by mouth daily.    . pravastatin (PRAVACHOL) 80 MG tablet Take 80 mg by mouth daily.    . Saline (DEEP SEA NASAL SPRAY NA) 1 sprat each nostril twice a day    . senna  (SENOKOT) 8.6 MG TABS tablet Take 2 tablets (17.2 mg total) by mouth daily. 120 each 0  . tamsulosin (FLOMAX) 0.4 MG CAPS capsule Take 0.4 mg by mouth daily after supper.     No current facility-administered medications on file prior to visit.      Review of Systems  Immunization History  Administered Date(s) Administered  . Influenza,inj,Quad PF,36+ Mos 03/07/2015  . Influenza-Unspecified 02/03/2016  . PPD Test 03/10/2015, 03/24/2015  . Pneumococcal-Unspecified 02/10/2016   Pertinent  Health Maintenance Due  Topic Date Due  . FOOT EXAM  09/11/2016 (Originally 08/31/1944)  . HEMOGLOBIN A1C  12/23/2016  . OPHTHALMOLOGY EXAM  02/09/2017  . PNA vac Low Risk Adult (2 of 2 - PCV13) 02/09/2017  . URINE MICROALBUMIN  06/25/2017  . INFLUENZA VACCINE  Completed   No flowsheet data found. Functional Status Survey:    There were no vitals filed for this visit. There is no height or weight on file to calculate BMI. Physical Exam  Labs reviewed:  Recent Labs  01/02/16 0720 03/15/16 1003 05/14/16 0700  NA 141 141 137  K 3.6 3.6 3.7  CL 108 109 105  CO2 '26 26 25  '$ GLUCOSE 154* 147* 93  BUN '10 14 10  '$ CREATININE 0.81 0.92 0.83  CALCIUM 9.0 9.0 9.1    Recent Labs  08/07/15 0715 09/11/15 0715 10/31/15 1502 06/25/16 0700  AST '25 18 17  '$ --   ALT '30 17 18  '$ --   ALKPHOS 66 62 82  --   BILITOT 0.2* 0.2* 0.4  --   PROT 6.8 7.3 7.9  --   ALBUMIN 3.5 3.4* 4.0 3.9    Recent Labs  10/31/15 1502 12/04/15 0700 01/14/16 0715 03/15/16 1003 05/14/16 0700  WBC 7.9 7.0 12.2* 7.7 7.4  NEUTROABS 4.5 4.0 8.4*  --   --   HGB 12.7* 11.7* 11.2* 11.4* 11.7*  HCT 39.3 35.9* 34.1* 35.2* 34.3*  MCV 85.8 85.1 84.6 86.1 84.3  PLT 287 288 229 286 376   Lab Results  Component Value Date   TSH 1.064 10/31/2015   Lab Results  Component Value Date   HGBA1C 6.8 (H) 06/25/2016   Lab Results  Component Value Date   CHOL 124 03/15/2016   HDL 33 (L) 03/15/2016   LDLCALC 76 03/15/2016    TRIG 75 03/15/2016   CHOLHDL 3.8 03/15/2016    Significant Diagnostic Results in last 30 days:  No results found.  Assessment/Plan There are no diagnoses linked to this encounter.   Family/ staff Communication:   Labs/tests ordered:       This encounter was created in error - please disregard.

## 2016-07-13 DIAGNOSIS — R1312 Dysphagia, oropharyngeal phase: Secondary | ICD-10-CM | POA: Diagnosis not present

## 2016-07-13 DIAGNOSIS — I639 Cerebral infarction, unspecified: Secondary | ICD-10-CM | POA: Diagnosis not present

## 2016-07-13 DIAGNOSIS — R279 Unspecified lack of coordination: Secondary | ICD-10-CM | POA: Diagnosis not present

## 2016-07-13 DIAGNOSIS — M6281 Muscle weakness (generalized): Secondary | ICD-10-CM | POA: Diagnosis not present

## 2016-07-14 DIAGNOSIS — I639 Cerebral infarction, unspecified: Secondary | ICD-10-CM | POA: Diagnosis not present

## 2016-07-14 DIAGNOSIS — R279 Unspecified lack of coordination: Secondary | ICD-10-CM | POA: Diagnosis not present

## 2016-07-14 DIAGNOSIS — M6281 Muscle weakness (generalized): Secondary | ICD-10-CM | POA: Diagnosis not present

## 2016-07-14 DIAGNOSIS — R1312 Dysphagia, oropharyngeal phase: Secondary | ICD-10-CM | POA: Diagnosis not present

## 2016-07-15 ENCOUNTER — Encounter: Payer: Self-pay | Admitting: Internal Medicine

## 2016-07-15 DIAGNOSIS — I639 Cerebral infarction, unspecified: Secondary | ICD-10-CM | POA: Diagnosis not present

## 2016-07-15 DIAGNOSIS — M6281 Muscle weakness (generalized): Secondary | ICD-10-CM | POA: Diagnosis not present

## 2016-07-15 DIAGNOSIS — R279 Unspecified lack of coordination: Secondary | ICD-10-CM | POA: Diagnosis not present

## 2016-07-15 DIAGNOSIS — R1312 Dysphagia, oropharyngeal phase: Secondary | ICD-10-CM | POA: Diagnosis not present

## 2016-07-15 NOTE — Progress Notes (Signed)
Location:   [place] Room Number: 104/W Place of Service:  SNF (31) Provider:  Freddi Starr, MD  Patient Care Team: [name], MD as PCP - General (Internal Medicine) Danie Binder, MD as Consulting Physician (Gastroenterology)  Extended Emergency Contact Information Primary Emergency Contact: Dorathy Kinsman [place] Phone: [phone] Relation: Daughter Secondary Emergency Contact: [place] Phone: [phone] Relation: Daughter  Code Status:  DNR Goals of care: Advanced Directive information Advanced Directives 07/15/2016  Does Patient Have a Medical Advance Directive? -  Type of Advance Directive Out of facility DNR (pink MOST or yellow form)  Does patient want to make changes to medical advance directive? No - Patient declined  Copy of [place] in Chart? No - copy requested  Would patient like information on creating a medical advance directive? -     Chief Complaint  Patient presents with  . Medical Management of Chronic Issues    Routine Visit    HPI:  Pt is a 82 y.o. male seen today for medical management of chronic diseases.     Past Medical History:  Diagnosis Date  . Anemia due to chronic blood loss   . CAD (coronary artery disease)    cath 11/2007: 70% LM ISR, SVG-LAD OK, SVG-D1 100%, IMA-D2 OK, SVG-OM OK, 4-vessel CABG 2000.  Marland Kitchen CAD (coronary artery disease)    s/p of 95% L main artery stenosis, 4/03: cutting balloon PTCA of 80% in-stent restenosis, 7/04. preserved L ventriculr function  . Carotid bruit    bilateral. no sigificant distal abd atherosclerosis by recent cath.   . Chronic bronchitis ([place])   . Constipation   . COPD (chronic obstructive pulmonary disease) (HCC)    ongoing tobacco   . CVA (cerebral infarction)   . DM2 (diabetes mellitus, type 2) ([place])   . Dyslipidemia   . Dysphagia   . Glaucoma   . HTN  (hypertension)    Past Surgical History:  Procedure Laterality Date  . CARDIAC CATHETERIZATION    . COLONOSCOPY N/A 05/14/2015   POOR PREP  . COLONOSCOPY N/A 06/02/2015   SLF: 1. four colorectal polyps removed. no source for anemia identified. 2. the left colon is redundant 3. moderate sized internal hemorroids.   . ESOPHAGOGASTRODUODENOSCOPY N/A 05/14/2015   CHRONIC GASTRITIS  . GIVENS CAPSULE STUDY N/A 06/17/2015   Procedure: GIVENS CAPSULE STUDY;  Surgeon: Danie Binder, MD;  Location[facility];  Service: Endoscopy;  Laterality: N/A;  0800    No Known Allergies  Current Outpatient Prescriptions on File Prior to Visit  Medication Sig Dispense Refill  . amLODipine (NORVASC) 2.5 MG tablet Take 2.5 mg by mouth daily.    . Calcium Carbonate-Vitamin D (OSCAL 500/200 D-3 PO) 1 Tablet once a day by mouth . Can't be given with iron    . clopidogrel (PLAVIX) 75 MG tablet Take 75 mg by mouth daily.    Marland Kitchen docusate sodium (COLACE) 100 MG capsule Take 100 mg by mouth 2 (two) times daily.    . ferrous sulfate 325 (65 FE) MG tablet Take 1 tablet (325 mg total) by mouth 3 (three) times daily with meals. 90 tablet 11  . labetalol (NORMODYNE) 200 MG tablet Take 200 mg by mouth 3 (three) times daily. Reported on 10/22/2015    . linaclotide (LINZESS) 72 MCG capsule Take 72 mcg by mouth daily before breakfast.    . lisinopril (  PRINIVIL,ZESTRIL) 40 MG tablet Take 40 mg by mouth daily.    . Melatonin 5 MG TABS Take 5 mg by mouth at bedtime as needed.    . metFORMIN (GLUCOPHAGE) 500 MG tablet Take 500 mg by mouth 2 (two) times daily with a meal.     . Multiple Vitamin (MULTIVITAMIN) tablet Take 1 tablet by mouth daily.    . Omega-3 Fatty Acids (FISH OIL) 1000 MG CAPS Take 1,000 mg by mouth 3 (three) times daily.    . pantoprazole (PROTONIX) 40 MG tablet Take 40 mg by mouth daily.    . pravastatin (PRAVACHOL) 80 MG tablet Take 80 mg by mouth daily.    . Saline (DEEP SEA NASAL SPRAY NA) 1 sprat each nostril  twice a day    . senna (SENOKOT) 8.6 MG TABS tablet Take 2 tablets (17.2 mg total) by mouth daily. 120 each 0  . tamsulosin (FLOMAX) 0.4 MG CAPS capsule Take 0.4 mg by mouth daily after supper.     No current facility-administered medications on file prior to visit.      Review of Systems  Immunization History  Administered Date(s) Administered  . Influenza,inj,Quad PF,36+ Mos 03/07/2015  . Influenza-Unspecified 02/03/2016  . PPD Test 03/10/2015, 03/24/2015  . Pneumococcal-Unspecified 02/10/2016   Pertinent  Health Maintenance Due  Topic Date Due  . FOOT EXAM  09/11/2016 (Originally 08/31/1944)  . HEMOGLOBIN A1C  12/23/2016  . OPHTHALMOLOGY EXAM  02/09/2017  . PNA vac Low Risk Adult (2 of 2 - PCV13) 02/09/2017  . URINE MICROALBUMIN  06/25/2017  . INFLUENZA VACCINE  Completed   No flowsheet data found. Functional Status Survey:    There were no vitals filed for this visit. There is no height or weight on file to calculate BMI. Physical Exam  Labs reviewed:  Recent Labs  01/02/16 0720 03/15/16 1003 05/14/16 0700  NA 141 141 137  K 3.6 3.6 3.7  CL 108 109 105  CO2 '26 26 25  '$ GLUCOSE 154* 147* 93  BUN '10 14 10  '$ CREATININE 0.81 0.92 0.83  CALCIUM 9.0 9.0 9.1    Recent Labs  08/07/15 0715 09/11/15 0715 10/31/15 1502 06/25/16 0700  AST '25 18 17  '$ --   ALT '30 17 18  '$ --   ALKPHOS 66 62 82  --   BILITOT 0.2* 0.2* 0.4  --   PROT 6.8 7.3 7.9  --   ALBUMIN 3.5 3.4* 4.0 3.9    Recent Labs  10/31/15 1502 12/04/15 0700 01/14/16 0715 03/15/16 1003 05/14/16 0700  WBC 7.9 7.0 12.2* 7.7 7.4  NEUTROABS 4.5 4.0 8.4*  --   --   HGB 12.7* 11.7* 11.2* 11.4* 11.7*  HCT 39.3 35.9* 34.1* 35.2* 34.3*  MCV 85.8 85.1 84.6 86.1 84.3  PLT 287 288 229 286 376   Lab Results  Component Value Date   TSH 1.064 10/31/2015   Lab Results  Component Value Date   HGBA1C 6.8 (H) 06/25/2016   Lab Results  Component Value Date   CHOL 124 03/15/2016   HDL 33 (L) 03/15/2016    LDLCALC 76 03/15/2016   TRIG 75 03/15/2016   CHOLHDL 3.8 03/15/2016    Significant Diagnostic Results in last 30 days:  No results found.  Assessment/Plan There are no diagnoses linked to this encounter.   Family/ staff Communication:   Labs/tests ordered:

## 2016-07-22 ENCOUNTER — Encounter: Payer: Self-pay | Admitting: Cardiovascular Disease

## 2016-07-22 ENCOUNTER — Ambulatory Visit (INDEPENDENT_AMBULATORY_CARE_PROVIDER_SITE_OTHER): Payer: Medicare Other | Admitting: Cardiovascular Disease

## 2016-07-22 VITALS — BP 120/50 | HR 61 | Ht 69.0 in | Wt 180.0 lb

## 2016-07-22 DIAGNOSIS — E7800 Pure hypercholesterolemia, unspecified: Secondary | ICD-10-CM | POA: Diagnosis not present

## 2016-07-22 DIAGNOSIS — I25812 Atherosclerosis of bypass graft of coronary artery of transplanted heart without angina pectoris: Secondary | ICD-10-CM

## 2016-07-22 DIAGNOSIS — I10 Essential (primary) hypertension: Secondary | ICD-10-CM | POA: Diagnosis not present

## 2016-07-22 DIAGNOSIS — Z8673 Personal history of transient ischemic attack (TIA), and cerebral infarction without residual deficits: Secondary | ICD-10-CM

## 2016-07-22 DIAGNOSIS — I6523 Occlusion and stenosis of bilateral carotid arteries: Secondary | ICD-10-CM | POA: Diagnosis not present

## 2016-07-22 DIAGNOSIS — I779 Disorder of arteries and arterioles, unspecified: Secondary | ICD-10-CM | POA: Diagnosis not present

## 2016-07-22 DIAGNOSIS — I739 Peripheral vascular disease, unspecified: Secondary | ICD-10-CM

## 2016-07-22 NOTE — Patient Instructions (Signed)
Your physician wants you to follow-up in: 1 year Dr Virgina Jock will receive a reminder letter in the mail two months in advance. If you don't receive a letter, please call our office to schedule the follow-up appointment.     Your physician recommends that you continue on your current medications as directed. Please refer to the Current Medication list given to you today.     Thank you for choosing [facility] !

## 2016-07-22 NOTE — Progress Notes (Signed)
This encounter was created in error - please disregard.

## 2016-07-22 NOTE — Progress Notes (Signed)
SUBJECTIVE: The patient presents for routine follow-up for coronary artery disease with a history of CABG. He underwent a low risk nuclear stress test on 05/09/15. Carotid Dopplers 05/09/15: Complete occlusion of the right common carotid artery which was previously noted and less than 50% left internal carotid artery stenosis.  The patient denies any symptoms of chest pain, palpitations, shortness of breath, lightheadedness, dizziness, leg swelling, orthopnea, PND, and syncope.  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  Review of Systems: As per "subjective", otherwise negative.  No Known Allergies  No current outpatient prescriptions on file.   No current facility-administered medications for this visit.     Past Medical History:  Diagnosis Date  . Anemia due to chronic blood loss   . CAD (coronary artery disease)    cath 11/2007: 70% LM ISR, SVG-LAD OK, SVG-D1 100%, IMA-D2 OK, SVG-OM OK, 4-vessel CABG 2000.  Marland Kitchen CAD (coronary artery disease)    s/p of 95% L main artery stenosis, 4/03: cutting balloon PTCA of 80% in-stent restenosis, 7/04. preserved L ventriculr function  . Carotid bruit    bilateral. no sigificant distal abd atherosclerosis by recent cath.   . Chronic bronchitis ([place])   . Constipation   . COPD (chronic obstructive pulmonary disease) (HCC)    ongoing tobacco   . CVA (cerebral infarction)   . DM2 (diabetes mellitus, type 2) ([place])   . Dyslipidemia   . Dysphagia   . Glaucoma   . HTN (hypertension)     Past Surgical History:  Procedure Laterality Date  . CARDIAC CATHETERIZATION    . COLONOSCOPY N/A 05/14/2015   POOR PREP  . COLONOSCOPY N/A 06/02/2015   SLF: 1. four colorectal polyps removed. no source for anemia identified. 2. the left colon is redundant 3. moderate sized internal hemorroids.   . ESOPHAGOGASTRODUODENOSCOPY N/A 05/14/2015   CHRONIC GASTRITIS  . GIVENS CAPSULE STUDY N/A 06/17/2015   Procedure: GIVENS CAPSULE STUDY;  Surgeon: Danie Binder, MD;  [facility];  Service: Endoscopy;  Laterality: N/A;  0800    Social History   Social History  . Marital status: Married    Spouse name: N/A  . Number of children: N/A  . Years of education: N/A   Occupational History  . Not on file.   Social History Main Topics  . Smoking status: Former Smoker    Quit date: 08/03/2014  . Smokeless tobacco: Never Used     Comment: 50 pack year hx ; not smoking @ [employer]  . Alcohol use No     Comment: has not had alcohol in 30-40 years   . Drug use: No  . Sexual activity: Not on file   Other Topics Concern  . Not on file   Social History Narrative  . No narrative on file     Vitals:   07/22/16 1527  BP: (!) 120/50  Pulse: 61  SpO2: 97%  Weight: 180 lb (81.6 kg)  Height: '5\' 9"'$  (1.753 m)    PHYSICAL EXAM General: NAD Neck: No JVD, no thyromegaly or thyroid nodule.  Lungs: Clear to auscultation bilaterally with normal respiratory effort. CV: Nondisplaced PMI. Regular rate and rhythm, normal S1/S2, no S2/G3, 1/6 systolic murmur heard throughout precordium.  No peripheral edema.    Abdomen: Soft, nontender, no distention.  Neurologic: Alert and oriented.  Psych: Normal affect.    ECG: Most recent ECG reviewed.      ASSESSMENT AND PLAN:  1. CAD:  Symptomatically stable. He underwent a low risk nuclear stress test on 05/09/15. Continue Plavix, labetalol, lisinopril, and pravastatin.  2. Essential HTN: Controlled on labetalol and lisinopril. No changes.  3. Hyperlipidemia: Continue pravastatin 80 mg.  4. History of CVA: On Plavix.  5. Carotid artery stenosis: Carotid Dopplers 05/09/15: Complete occlusion of the right common carotid artery which was previously noted and less than 50% left internal carotid artery stenosis.  Dispo: f/u 1 year.  Kate Sable, M.D., F.A.C.C.

## 2016-07-23 ENCOUNTER — Non-Acute Institutional Stay (SKILLED_NURSING_FACILITY): Payer: Medicare Other | Admitting: Internal Medicine

## 2016-07-23 DIAGNOSIS — D500 Iron deficiency anemia secondary to blood loss (chronic): Secondary | ICD-10-CM | POA: Diagnosis not present

## 2016-07-23 DIAGNOSIS — K5900 Constipation, unspecified: Secondary | ICD-10-CM | POA: Diagnosis not present

## 2016-07-23 DIAGNOSIS — E1151 Type 2 diabetes mellitus with diabetic peripheral angiopathy without gangrene: Secondary | ICD-10-CM | POA: Diagnosis not present

## 2016-07-23 DIAGNOSIS — I2510 Atherosclerotic heart disease of native coronary artery without angina pectoris: Secondary | ICD-10-CM

## 2016-07-23 DIAGNOSIS — I10 Essential (primary) hypertension: Secondary | ICD-10-CM

## 2016-07-23 DIAGNOSIS — Z8673 Personal history of transient ischemic attack (TIA), and cerebral infarction without residual deficits: Secondary | ICD-10-CM

## 2016-07-23 NOTE — Progress Notes (Signed)
This is a routine visit.  Level care skilled.  Facility is [facility].  Chief complaint routine visit for medical management of chronic medical conditions including coronary artery disease status post CABG-diabetes type 2-constipation-history of CVA-hypertension-hyperlipidemia-poor vision-.  History of present illness.  Patient is a very pleasant 82 year old male with the above diagnoses-she continues to be quite stable initially when he came area had significant failure to thrive issues but this has not been the case in an extended period of time his weight appears to be stable around the 185 pounds he is eating and drinking quite well.  Does have a history of coronary artery disease recently saw cardiology and thought to be stable he has a history of CABG in 2000 and PCI of the left main coronary artery in 2003 he also has a history of carotid artery stenosis with complete occlusion of the right RCA however this is thought to be stable.--He continues on Plavix a beta blocker and statin as well as an ACE inhibitor.  Regards to anemia he does have follow-up with GI with a ferritin and CBC due in April.  He's hemoglobin has been stable recently most recently 11.7 on lab done on 05/14/2016.  He is status post CVA of the right cerebellum left greater than right basal ganglia brainstem and right greater than left carotid radiata--he does continue on her Plavix and statin and again is doing quite well with supportive care.  Does have a history type 2 diabetes on Glucophage as well as an ACE inhibitor-blood sugars are largely in the lower to mid 100s and have been quite stable later in the at times she does have readings above 200 but this does not appear to be consistent.  Most recent hemoglobin A1c was 6.8 back in February and actually shows improvement.  Regards to hypertension this appears stable as well recent blood pressures 129/63-108/53-he is on lisinopril 40 mg as well as Norvasc 2.5  mg a day as well as labetalol  Does have a history of constipation has been followed by GI he is onLinzess  and this appears to be stable with no recent acute issues recently is also on senna. As well as Colace He also has a history GI bleed and anemia and again this has been stable and followed by GI--he continues on iron  Regards to BPH he continues on Flomax.  Currently is lying in bed comfortably has no complaints vital signs appear to be stable     Past Medical History:  Diagnosis Date  . Anemia due to chronic blood loss   . CAD (coronary artery disease)    cath 11/2007: 70% LM ISR, SVG-LAD OK, SVG-D1 100%, IMA-D2 OK, SVG-OM OK, 4-vessel CABG 2000.  Marland Kitchen CAD (coronary artery disease)    s/p of 95% L main artery stenosis, 4/03: cutting balloon PTCA of 80% in-stent restenosis, 7/04. preserved L ventriculr function  . Carotid bruit    bilateral. no sigificant distal abd atherosclerosis by recent cath.   . Chronic bronchitis ([place])   . Constipation   . COPD (chronic obstructive pulmonary disease) (HCC)    ongoing tobacco   . CVA (cerebral infarction)   . DM2 (diabetes mellitus, type 2) ([place])   . Dyslipidemia   . Dysphagia   . Glaucoma   . HTN (hypertension)         Past Surgical History:  Procedure Laterality Date  . CARDIAC CATHETERIZATION    . COLONOSCOPY N/A 05/14/2015   POOR PREP  .  COLONOSCOPY N/A 06/02/2015   SLF: 1. four colorectal polyps removed. no source for anemia identified. 2. the left colon is redundant 3. moderate sized internal hemorroids.   . ESOPHAGOGASTRODUODENOSCOPY N/A 05/14/2015   CHRONIC GASTRITIS  . GIVENS CAPSULE STUDY N/A 06/17/2015   Procedure: GIVENS CAPSULE STUDY;  Surgeon: Danie Binder, MD;  [facility];  Service: Endoscopy;  Laterality: N/A;  0800    No Known Allergies        Current Outpatient Prescriptions on File Prior to Visit  Medication Sig Dispense Refill  . amLODipine (NORVASC) 2.5 MG tablet  Take 2.5 mg by mouth daily.    . Calcium Carbonate-Vitamin D (OSCAL 500/200 D-3 PO) 1 Tablet once a day by mouth . Can't be given with iron    . clopidogrel (PLAVIX) 75 MG tablet Take 75 mg by mouth daily.    Marland Kitchen docusate sodium (COLACE) 100 MG capsule Take 100 mg by mouth 2 (two) times daily.    . ferrous sulfate 325 (65 FE) MG tablet Take 1 tablet (325 mg total) by mouth 3 (three) times daily with meals. 90 tablet 11  . labetalol (NORMODYNE) 200 MG tablet Take 200 mg by mouth 3 (three) times daily. Reported on 10/22/2015    . linaclotide (LINZESS) 72 MCG capsule Take 72 mcg by mouth daily before breakfast.    . Melatonin 5 MG TABS Take 5 mg by mouth at bedtime as needed.    . metFORMIN (GLUCOPHAGE) 500 MG tablet Take 500 mg by mouth 2 (two) times daily with a meal.     . Multiple Vitamin (MULTIVITAMIN) tablet Take 1 tablet by mouth daily.    . Omega-3 Fatty Acids (FISH OIL) 1000 MG CAPS Take 1,000 mg by mouth 3 (three) times daily.    . pantoprazole (PROTONIX) 40 MG tablet Take 40 mg by mouth daily.    . pravastatin (PRAVACHOL) 80 MG tablet Take 80 mg by mouth daily.    . Saline (DEEP SEA NASAL SPRAY NA) 1 sprat each nostril twice a day    . senna (SENOKOT) 8.6 MG TABS tablet Take 2 tablets (17.2 mg total) by mouth daily. 120 each 0  . tamsulosin (FLOMAX) 0.4 MG CAPS capsule Take 0.4 mg by mouth daily after supper.     No current facility-administered medications on file prior to visit.    Review of Systems   In general does not complain of any fever or chills has hadt weight gain which appears to have stabilized around the mid 180s  Skin does not complain of rashes or itching.  Head ears eyes nose mouth and throat does not complaining sore throat-he is legally blind says he sees shadows  Resp-- does not complain of shortness breath or cough.  Cardiac no chest pain. Or significant lower extremity edema  GI is not complaining of any nausea vomiting  diarrhea or constipation does have a history of constipation in the past is not complaining of any abdominal discomfort.  Muscle skeletal is not complaining of joint pain.  Neurologic does not complain of dizziness or headache or syncope.  Psych nursing staff does not report any behavior issues continues to be pleasant cooperative  I       Immunization History  Administered Date(s) Administered  . Influenza,inj,Quad PF,36+ Mos 03/07/2015  . Influenza-Unspecified 02/03/2016  . PPD Test 03/10/2015, 03/24/2015  . Pneumococcal-Unspecified 02/10/2016       Pertinent  Health Maintenance Due  Topic Date Due  . FOOT EXAM  09/11/2016 (Originally 08/31/1944)  . URINE MICROALBUMIN  09/11/2016 (Originally 08/31/1944)  . HEMOGLOBIN A1C  09/12/2016  . OPHTHALMOLOGY EXAM  02/09/2017  . PNA vac Low Risk Adult (2 of 2 - PCV13) 02/09/2017  . INFLUENZA VACCINE  Completed   No flowsheet data found. Functional Status Survey:      Temperature 98.3 pulse 61 respirations 18 blood pressure 129/63 weight is stable at 184.2 Physical Exam  GENERAL APPEARANCE: The patient is not in any distresslying comfortably in bed he has just eaten dinner   His skin is warm and dry Eyes-apparently he is legally blind he does apparently have some vision in his right eye but has significant visual --this appears to be baseline.Left eye continues to be opaque Oropharynx clear mucous membranes moist CHEST/RESPIRATORY: Clear air entry bilaterally. No labored breathing CARDIOVASCULAR:  CARDIAC: . Heart sounds are regular s --2/6 systolic murmur. .--trace  lower extremity edema GASTROINTESTINAL:  ABDOMEN: Soft nontender with positive bowel sounds Musculoskeletal-appears to move his extremities at baseline he ambulates in a wheelchair--he has some slight left-sided weakness somewhat limited exam since patient is lying in bed    NEUROLOGICAL:  CRANIAL NERVES: He  does have left facial weakness. This is not new.  Otherwise appears to move his extremities at baseline some weakness of his left upper extremity  But this appears to be chronic Psych he appears grossly alert and oriented pleasant and appropriate  Labs reviewed:  06/25/2016.  Albumin 3.9.  Hemoglobin A1c 6.8.  05/14/2016.  Sodium 137 potassium 3.7 BUN 10 creatinine 0.83.  WBC 7.4 hemoglobin 11.7 platelets 376.    RecentLabs(withinlast365days)   Recent Labs  10/31/15 1502 01/02/16 0720 03/15/16 1003  NA 137 141 141  K 4.0 3.6 3.6  CL 106 108 109  CO2 '27 26 26  '$ GLUCOSE 182* 154* 147*  BUN '18 10 14  '$ CREATININE 0.91 0.81 0.92  CALCIUM 9.7 9.0 9.0      RecentLabs(withinlast365days)   Recent Labs  08/07/15 0715 09/11/15 0715 10/31/15 1502  AST '25 18 17  '$ ALT '30 17 18  '$ ALKPHOS 66 62 82  BILITOT 0.2* 0.2* 0.4  PROT 6.8 7.3 7.9  ALBUMIN 3.5 3.4* 4.0      RecentLabs(withinlast365days)   Recent Labs  10/31/15 1502 12/04/15 0700 01/14/16 0715 03/15/16 1003  WBC 7.9 7.0 12.2* 7.7  NEUTROABS 4.5 4.0 8.4*  --   HGB 12.7* 11.7* 11.2* 11.4*  HCT 39.3 35.9* 34.1* 35.2*  MCV 85.8 85.1 84.6 86.1  PLT 287 288 229 286     RecentLabs       Lab Results  Component Value Date   TSH 1.064 10/31/2015     RecentLabs       Lab Results  Component Value Date   HGBA1C 8.1 (H) 03/15/2016     RecentLabs       Lab Results  Component Value Date   CHOL 124 03/15/2016   HDL 33 (L) 03/15/2016   LDLCALC 76 03/15/2016   TRIG 75 03/15/2016   CHOLHDL 3.8 03/15/2016      Significant Diagnostic Results in last 30 days:  ImagingResults  No results found.    Assessment/Plan  #1 history CVA--he continues on Plavix as well as a statin he appears to be stable in this regard doing well with supportive care continues to [facility] largely in a wheelchair.  #2 anemia secondary to GI bleed AVM malformations he has been  followed by GI he is on iron and  and hemoglobin has been  stable now for some time most recently 11.7on lab done in mid-January we will update this  #3 diabetes type 2 he is on Glucophage twice a day now as well as an ACE inhibito Recent hemoglobin A1c last month shows improvement at 6.8 blood sugars appear to be largely in the 100s with occasional rises into the 200s that are not consistent  #4-hypertension he is on lisinopril 40 mg a day and labetalol 200 mg 3 times a day As well as Norvasc 2.5 mg a day as noted above this appears to be stable.  #5 history hyperlipidemia LDL on recent lab was 76 he is on a statin. Is also on Fish oil  #6 history BPH this appears relatively stable he is on Flomax.  #7 history of constipation this appears stabilized on Linzessas well as Colace. And Senokot  #8 insomnia he is on melatonin when necessary he is not complaining of any insomnia today nursing staff does not report this either.  #9 history of failure to thrive this certainly does not appear to be the case anymore his weight is stable now at 184 continue supportive care he's done very well with this during his stay here.  #10 history coronary artery disease continues to be asymptomatic on Plavix labetalol lisinopril and a statin.  11 history of carotid artery stenosis-carotid Dopplers in January 2017 showed complete occlusion of the right common carotid artery and less than 60% left internal carotid artery stenosis continues on Plavix this appears to be stable.  He does have follow with cardiology in one year.   Of note with history of hypertension will check a metabolic panel as well as as a CBC with his history of anemia    [id number]-of note greater than 40 minutes spent assessing patient-reviewing his chart-reviewing his labs and consult notes-and coordinating and formulating a plan of care for numerous diagnoses-of note greater than 50% of time spent coordinating a plan of  care

## 2016-07-26 ENCOUNTER — Encounter (HOSPITAL_COMMUNITY)
Admission: RE | Admit: 2016-07-26 | Discharge: 2016-07-26 | Disposition: A | Discharge status: Home / Self Care | Payer: Medicare Other | Source: Skilled Nursing Facility | Attending: Internal Medicine | Admitting: Internal Medicine

## 2016-07-26 DIAGNOSIS — I10 Essential (primary) hypertension: Secondary | ICD-10-CM | POA: Insufficient documentation

## 2016-07-26 DIAGNOSIS — D500 Iron deficiency anemia secondary to blood loss (chronic): Secondary | ICD-10-CM | POA: Insufficient documentation

## 2016-07-26 LAB — CBC WITH DIFFERENTIAL/PLATELET
Basophils Absolute: 0 10*3/uL (ref 0.0–0.1)
Basophils Relative: 1 %
EOS ABS: 0.5 10*3/uL (ref 0.0–0.7)
EOS PCT: 6 %
HEMATOCRIT: 35.4 % — AB (ref 39.0–52.0)
HEMOGLOBIN: 12 g/dL — AB (ref 13.0–17.0)
LYMPHS PCT: 35 %
Lymphs Abs: 2.8 10*3/uL (ref 0.7–4.0)
MCH: 29.1 pg (ref 26.0–34.0)
MCHC: 33.9 g/dL (ref 30.0–36.0)
MCV: 85.9 fL (ref 78.0–100.0)
Monocytes Absolute: 0.6 10*3/uL (ref 0.1–1.0)
Monocytes Relative: 7 %
Neutro Abs: 4 10*3/uL (ref 1.7–7.7)
Neutrophils Relative %: 51 %
Platelets: 309 10*3/uL (ref 150–400)
RBC: 4.12 MIL/uL — ABNORMAL LOW (ref 4.22–5.81)
RDW: 14.3 % (ref 11.5–15.5)
WBC: 7.8 10*3/uL (ref 4.0–10.5)

## 2016-07-26 LAB — COMPREHENSIVE METABOLIC PANEL
ALK PHOS: 53 U/L (ref 38–126)
ALT: 13 U/L — ABNORMAL LOW (ref 17–63)
ANION GAP: 8 (ref 5–15)
AST: 17 U/L (ref 15–41)
Albumin: 3.4 g/dL — ABNORMAL LOW (ref 3.5–5.0)
BILIRUBIN TOTAL: 0.5 mg/dL (ref 0.3–1.2)
BUN: 12 mg/dL (ref 6–20)
CALCIUM: 9 mg/dL (ref 8.9–10.3)
CO2: 25 mmol/L (ref 22–32)
Chloride: 107 mmol/L (ref 101–111)
Creatinine, Ser: 0.83 mg/dL (ref 0.61–1.24)
Glucose, Bld: 105 mg/dL — ABNORMAL HIGH (ref 65–99)
POTASSIUM: 3.8 mmol/L (ref 3.5–5.1)
Sodium: 140 mmol/L (ref 135–145)
TOTAL PROTEIN: 6.9 g/dL (ref 6.5–8.1)

## 2016-07-29 ENCOUNTER — Other Ambulatory Visit: Payer: Self-pay

## 2016-07-29 DIAGNOSIS — D500 Iron deficiency anemia secondary to blood loss (chronic): Secondary | ICD-10-CM

## 2016-08-16 ENCOUNTER — Non-Acute Institutional Stay (SKILLED_NURSING_FACILITY): Payer: Medicare Other | Admitting: Internal Medicine

## 2016-08-16 ENCOUNTER — Other Ambulatory Visit (HOSPITAL_COMMUNITY)
Admission: RE | Admit: 2016-08-16 | Discharge: 2016-08-16 | Disposition: A | Discharge status: Home / Self Care | Payer: Medicare Other | Source: Skilled Nursing Facility | Attending: Internal Medicine | Admitting: Internal Medicine

## 2016-08-16 ENCOUNTER — Encounter: Payer: Self-pay | Admitting: Internal Medicine

## 2016-08-16 DIAGNOSIS — I2510 Atherosclerotic heart disease of native coronary artery without angina pectoris: Secondary | ICD-10-CM

## 2016-08-16 DIAGNOSIS — K5900 Constipation, unspecified: Secondary | ICD-10-CM

## 2016-08-16 DIAGNOSIS — I10 Essential (primary) hypertension: Secondary | ICD-10-CM | POA: Diagnosis not present

## 2016-08-16 DIAGNOSIS — E1151 Type 2 diabetes mellitus with diabetic peripheral angiopathy without gangrene: Secondary | ICD-10-CM

## 2016-08-16 DIAGNOSIS — D500 Iron deficiency anemia secondary to blood loss (chronic): Secondary | ICD-10-CM

## 2016-08-16 DIAGNOSIS — R0989 Other specified symptoms and signs involving the circulatory and respiratory systems: Secondary | ICD-10-CM | POA: Diagnosis not present

## 2016-08-16 DIAGNOSIS — R05 Cough: Secondary | ICD-10-CM

## 2016-08-16 DIAGNOSIS — R059 Cough, unspecified: Secondary | ICD-10-CM

## 2016-08-16 LAB — CBC WITH DIFFERENTIAL/PLATELET
BASOS ABS: 0 10*3/uL (ref 0.0–0.1)
BASOS PCT: 0 %
EOS ABS: 0.5 10*3/uL (ref 0.0–0.7)
EOS PCT: 5 %
HEMATOCRIT: 33.3 % — AB (ref 39.0–52.0)
Hemoglobin: 11.4 g/dL — ABNORMAL LOW (ref 13.0–17.0)
Lymphocytes Relative: 20 %
Lymphs Abs: 2 10*3/uL (ref 0.7–4.0)
MCH: 29.1 pg (ref 26.0–34.0)
MCHC: 34.2 g/dL (ref 30.0–36.0)
MCV: 84.9 fL (ref 78.0–100.0)
MONO ABS: 0.6 10*3/uL (ref 0.1–1.0)
MONOS PCT: 6 %
NEUTROS ABS: 7.1 10*3/uL (ref 1.7–7.7)
Neutrophils Relative %: 69 %
PLATELETS: 276 10*3/uL (ref 150–400)
RBC: 3.92 MIL/uL — ABNORMAL LOW (ref 4.22–5.81)
RDW: 13.6 % (ref 11.5–15.5)
WBC: 10.2 10*3/uL (ref 4.0–10.5)

## 2016-08-16 LAB — COMPREHENSIVE METABOLIC PANEL
ALBUMIN: 3.4 g/dL — AB (ref 3.5–5.0)
ALT: 18 U/L (ref 17–63)
ANION GAP: 10 (ref 5–15)
AST: 21 U/L (ref 15–41)
Alkaline Phosphatase: 54 U/L (ref 38–126)
BILIRUBIN TOTAL: 0.2 mg/dL — AB (ref 0.3–1.2)
BUN: 13 mg/dL (ref 6–20)
CHLORIDE: 106 mmol/L (ref 101–111)
CO2: 22 mmol/L (ref 22–32)
Calcium: 8.5 mg/dL — ABNORMAL LOW (ref 8.9–10.3)
Creatinine, Ser: 0.89 mg/dL (ref 0.61–1.24)
GFR calc Af Amer: >60 mL/min (ref >60)
GFR calc non Af Amer: >60 mL/min (ref >60)
Glucose, Bld: 145 mg/dL — ABNORMAL HIGH (ref 65–99)
POTASSIUM: 3.5 mmol/L (ref 3.5–5.1)
Sodium: 138 mmol/L (ref 135–145)
TOTAL PROTEIN: 6.8 g/dL (ref 6.5–8.1)

## 2016-08-16 NOTE — Progress Notes (Signed)
Location:   [facility] Room Number: 104/W Place of Service:  SNF [phone]) Provider:  Clydene Fake, MD  Patient Care Team: [name], MD as PCP - General (Internal Medicine) Danie Binder, MD as Consulting Physician (Gastroenterology)  Extended Emergency Contact Information Primary Emergency Contact: Dorathy Kinsman States of [place] Phone: [phone] Relation: Daughter Secondary Emergency Contact: [place] of [place] Phone: [phone] Relation: Daughter  Code Status:  DNR Goals of care: Advanced Directive information Advanced Directives 08/16/2016  Does Patient Have a Medical Advance Directive? Yes  Type of Advance Directive Out of facility DNR (pink MOST or yellow form)  Does patient want to make changes to medical advance directive? No - Patient declined  Copy of [place] in Chart? -  Would patient like information on creating a medical advance directive? -     Chief Complaint  Patient presents with  . Acute Visit    patients c/o Cough and had elevated BP    HPI:  Pt is a 82 y.o. male seen today for an acute visit for  Cough And Elevated BP  Patient has H/O GI bleed, Diabetes type 2 S/P CVA , Hypertension, Hyperlipidemia, constipation. Poor vision . CAD S/P CABG in 2000 and PCI of Left Main on 2003 and Carotid artery stenosis.with complete occlusion of RCA.  He was seen today as his BP was elevated to 182/86. Usually he has been running around 725-366 Systolic.since he was  started on Norvasc.  He also has been feeling Bad since this morning c/o Dry Cough , Hurting all over, And Decreased appetite. Also C/O Feeling weak. No nausea or vomiting. No Runny nose. No SOB. He does not have Fever Or chills. No headache.     Past Medical History:  Diagnosis Date  . Anemia due to chronic blood loss   . CAD (coronary artery disease)    cath 11/2007: 70% LM ISR, SVG-LAD  OK, SVG-D1 100%, IMA-D2 OK, SVG-OM OK, 4-vessel CABG 2000.  Marland Kitchen CAD (coronary artery disease)    s/p of 95% L main artery stenosis, 4/03: cutting balloon PTCA of 80% in-stent restenosis, 7/04. preserved L ventriculr function  . Carotid bruit    bilateral. no sigificant distal abd atherosclerosis by recent cath.   . Chronic bronchitis ([place])   . Constipation   . COPD (chronic obstructive pulmonary disease) (HCC)    ongoing tobacco   . CVA (cerebral infarction)   . DM2 (diabetes mellitus, type 2) ([place])   . Dyslipidemia   . Dysphagia   . Glaucoma   . HTN (hypertension)    Past Surgical History:  Procedure Laterality Date  . CARDIAC CATHETERIZATION    . COLONOSCOPY N/A 05/14/2015   POOR PREP  . COLONOSCOPY N/A 06/02/2015   SLF: 1. four colorectal polyps removed. no source for anemia identified. 2. the left colon is redundant 3. moderate sized internal hemorroids.   . ESOPHAGOGASTRODUODENOSCOPY N/A 05/14/2015   CHRONIC GASTRITIS  . GIVENS CAPSULE STUDY N/A 06/17/2015   Procedure: GIVENS CAPSULE STUDY;  Surgeon: Danie Binder, MD;  [facility];  Service: Endoscopy;  Laterality: N/A;  0800    No Known Allergies  Outpatient Encounter Prescriptions as of 08/16/2016  Medication Sig  . amLODipine (NORVASC) 2.5 MG tablet Take 2.5 mg by mouth daily.  . Calcium Carbonate-Vitamin D (OSCAL 500/200 D-3 PO) 1 Tablet once a day by mouth . Can't be given with iron  .  clopidogrel (PLAVIX) 75 MG tablet Take 75 mg by mouth daily.  Marland Kitchen docusate sodium (COLACE) 100 MG capsule Take 100 mg by mouth 2 (two) times daily.  . ferrous sulfate 325 (65 FE) MG tablet Take 1 tablet (325 mg total) by mouth 3 (three) times daily with meals.  Marland Kitchen labetalol (NORMODYNE) 200 MG tablet Take 200 mg by mouth 3 (three) times daily. Reported on 10/22/2015  . linaclotide (LINZESS) 72 MCG capsule Take 72 mcg by mouth daily before breakfast.  . lisinopril (PRINIVIL,ZESTRIL) 40 MG tablet Take 40 mg by mouth daily.  .  metFORMIN (GLUCOPHAGE) 500 MG tablet Take 500 mg by mouth 2 (two) times daily with a meal.   . Multiple Vitamin (MULTIVITAMIN) tablet Take 1 tablet by mouth daily.  . Omega-3 Fatty Acids (FISH OIL) 1000 MG CAPS Take 1,000 mg by mouth 3 (three) times daily.  . pantoprazole (PROTONIX) 40 MG tablet Take 40 mg by mouth daily.  . pravastatin (PRAVACHOL) 80 MG tablet Take 80 mg by mouth daily.  Marland Kitchen senna (SENOKOT) 8.6 MG TABS tablet Take 2 tablets (17.2 mg total) by mouth daily.  . tamsulosin (FLOMAX) 0.4 MG CAPS capsule Take 0.4 mg by mouth daily after supper.  . [DISCONTINUED] Melatonin 5 MG TABS Take 5 mg by mouth at bedtime as needed.  . [DISCONTINUED] Saline (DEEP SEA NASAL SPRAY NA) 1 sprat each nostril twice a day   No facility-administered encounter medications on file as of 08/16/2016.      Review of Systems  Constitutional: Positive for activity change, appetite change and fatigue. Negative for chills, diaphoresis and fever.  HENT: Negative.   Respiratory: Positive for cough. Negative for apnea, chest tightness, shortness of breath and wheezing.   Cardiovascular: Negative.   Gastrointestinal: Negative.   Genitourinary: Negative.   Musculoskeletal: Positive for arthralgias. Negative for back pain, gait problem and joint swelling.  Skin: Negative.   Neurological: Negative.   Psychiatric/Behavioral: Negative.     Immunization History  Administered Date(s) Administered  . Influenza,inj,Quad PF,36+ Mos 03/07/2015  . Influenza-Unspecified 02/03/2016  . PPD Test 03/10/2015, 03/24/2015  . Pneumococcal-Unspecified 02/10/2016   Pertinent  Health Maintenance Due  Topic Date Due  . FOOT EXAM  09/11/2016 (Originally 08/31/1944)  . INFLUENZA VACCINE  12/01/2016  . HEMOGLOBIN A1C  12/23/2016  . OPHTHALMOLOGY EXAM  02/09/2017  . PNA vac Low Risk Adult (2 of 2 - PCV13) 02/09/2017  . URINE MICROALBUMIN  06/25/2017   No flowsheet data found. Functional Status Survey:    Vitals:    08/16/16 0928  BP: (!) 182/86 Checked manually was 190/80  Pulse: 71  Resp: 18  Temp: 98.1 F (36.7 C)  TempSrc: Oral   There is no height or weight on file to calculate BMI. Physical Exam  Constitutional: He appears well-developed and well-nourished.  HENT:  Head: Normocephalic.  Mouth/Throat: Oropharynx is clear and moist.  Eyes: Conjunctivae and EOM are normal.  Neck: Normal range of motion. Neck supple.  Cardiovascular: Normal rate, regular rhythm and normal heart sounds.   Pulmonary/Chest: Effort normal and breath sounds normal. No respiratory distress. He has no wheezes. He has no rales.  Abdominal: Soft. Bowel sounds are normal. He exhibits no distension. There is no tenderness. There is no rebound.  Musculoskeletal:  Trace edema b/l  Lymphadenopathy:    He has no cervical adenopathy.  Neurological: He is alert.  Good motor strength in Right UE and LE. Slightly reduced strength in left Upper and Lower extremity.  Skin: Skin is warm and dry. No rash noted. No erythema.  Psychiatric: He has a normal mood and affect. His behavior is normal. Thought content normal.    Labs reviewed:  Recent Labs  03/15/16 1003 05/14/16 0700 07/26/16 0700  NA 141 137 140  K 3.6 3.7 3.8  CL 109 105 107  CO2 '26 25 25  '$ GLUCOSE 147* 93 105*  BUN '14 10 12  '$ CREATININE 0.92 0.83 0.83  CALCIUM 9.0 9.1 9.0    Recent Labs  09/11/15 0715 10/31/15 1502 06/25/16 0700 07/26/16 0700  AST 18 17  --  17  ALT 17 18  --  13*  ALKPHOS 62 82  --  53  BILITOT 0.2* 0.4  --  0.5  PROT 7.3 7.9  --  6.9  ALBUMIN 3.4* 4.0 3.9 3.4*    Recent Labs  12/04/15 0700 01/14/16 0715 03/15/16 1003 05/14/16 0700 07/26/16 0700  WBC 7.0 12.2* 7.7 7.4 7.8  NEUTROABS 4.0 8.4*  --   --  4.0  HGB 11.7* 11.2* 11.4* 11.7* 12.0*  HCT 35.9* 34.1* 35.2* 34.3* 35.4*  MCV 85.1 84.6 86.1 84.3 85.9  PLT 288 229 286 376 309   Lab Results  Component Value Date   TSH 1.064 10/31/2015   Lab Results    Component Value Date   HGBA1C 6.8 (H) 06/25/2016   Lab Results  Component Value Date   CHOL 124 03/15/2016   HDL 33 (L) 03/15/2016   LDLCALC 76 03/15/2016   TRIG 75 03/15/2016   CHOLHDL 3.8 03/15/2016    Significant Diagnostic Results in last 30 days:  No results found.  Assessment/Plan  Essential hypertension Usually his Systolic BP runs in 469-629. Will give him Clonidine 0.1 Mg right now. Repeat BP in an a hour. Patient also got his morning Meds. Will also increase his Norvasc to 5 mg. Continue to monitor Vitas Q Shift. Repeat Labs today.  Cough Will get Chest Xray to rule out Pneumonia CBC today Will start on Tessalon TID PRn  DM (diabetes mellitus) with peripheral vascular complication  Doing Much better with Sugars mostly running less then 200 Last A1C was 6.8  CAD Just saw Cardiologist .  Asymptomatic right now.  Anemia due to chronic blood loss HGB stable   Addendum Repeat BP was still high after clonidine so another dose of clonidine was given and finally his BP was 140/86.     Family/ staff Communication:   Labs/tests ordered:  CMP, CBC with Diff, Chest Xray. Total time spent in this patient care encounter was 45_ minutes; greater than 50% of the visit spent counseling patient and coordinating care for problems addressed at this encounter.

## 2016-08-23 ENCOUNTER — Encounter: Payer: Self-pay | Admitting: Internal Medicine

## 2016-08-23 NOTE — Progress Notes (Signed)
Location:   [facility] Room Number: 104/W Place of Service:  SNF [phone]) Provider:  Clydene Fake, MD  Patient Care Team: [name], MD as PCP - General (Internal Medicine) Danie Binder, MD as Consulting Physician (Gastroenterology)  Extended Emergency Contact Information Primary Emergency Contact: Dorathy Kinsman States of [place] Phone: [phone] Relation: Daughter Secondary Emergency Contact: [place] of [place] Phone: [phone] Relation: Daughter  Code Status:  DNR Goals of care: Advanced Directive information Advanced Directives 08/23/2016  Does Patient Have a Medical Advance Directive? Yes  Type of Advance Directive Out of facility DNR (pink MOST or yellow form)  Does patient want to make changes to medical advance directive? No - Patient declined  Copy of [place] in Chart? -  Would patient like information on creating a medical advance directive? -     Chief Complaint  Patient presents with  . Medical Management of Chronic Issues    Routine Visit    HPI:  Pt is a 82 y.o. male seen today for medical management of chronic diseases.     Past Medical History:  Diagnosis Date  . Anemia due to chronic blood loss   . CAD (coronary artery disease)    cath 11/2007: 70% LM ISR, SVG-LAD OK, SVG-D1 100%, IMA-D2 OK, SVG-OM OK, 4-vessel CABG 2000.  Marland Kitchen CAD (coronary artery disease)    s/p of 95% L main artery stenosis, 4/03: cutting balloon PTCA of 80% in-stent restenosis, 7/04. preserved L ventriculr function  . Carotid bruit    bilateral. no sigificant distal abd atherosclerosis by recent cath.   . Chronic bronchitis ([place])   . Constipation   . COPD (chronic obstructive pulmonary disease) (HCC)    ongoing tobacco   . CVA (cerebral infarction)   . DM2 (diabetes mellitus, type 2) ([place])   . Dyslipidemia   . Dysphagia   . Glaucoma   . HTN (hypertension)    Past  Surgical History:  Procedure Laterality Date  . CARDIAC CATHETERIZATION    . COLONOSCOPY N/A 05/14/2015   POOR PREP  . COLONOSCOPY N/A 06/02/2015   SLF: 1. four colorectal polyps removed. no source for anemia identified. 2. the left colon is redundant 3. moderate sized internal hemorroids.   . ESOPHAGOGASTRODUODENOSCOPY N/A 05/14/2015   CHRONIC GASTRITIS  . GIVENS CAPSULE STUDY N/A 06/17/2015   Procedure: GIVENS CAPSULE STUDY;  Surgeon: Danie Binder, MD;  [facility];  Service: Endoscopy;  Laterality: N/A;  0800    No Known Allergies  Outpatient Encounter Prescriptions as of 08/23/2016  Medication Sig  . amLODipine (NORVASC) 2.5 MG tablet Take 5 mg by mouth daily.   . Calcium Carbonate-Vitamin D (OSCAL 500/200 D-3 PO) 1 Tablet once a day by mouth . Can't be given with iron  . clopidogrel (PLAVIX) 75 MG tablet Take 75 mg by mouth daily.  Marland Kitchen docusate sodium (COLACE) 100 MG capsule Take 100 mg by mouth 2 (two) times daily.  . ferrous sulfate 325 (65 FE) MG tablet Take 1 tablet (325 mg total) by mouth 3 (three) times daily with meals.  Marland Kitchen labetalol (NORMODYNE) 200 MG tablet Take 200 mg by mouth 3 (three) times daily. Reported on 10/22/2015  . linaclotide (LINZESS) 72 MCG capsule Take 72 mcg by mouth daily before breakfast.  . lisinopril (PRINIVIL,ZESTRIL) 40 MG tablet Take 40 mg by mouth daily.  Marland Kitchen loratadine (CLARITIN) 10 MG tablet Take 10 mg by  mouth daily.  . metFORMIN (GLUCOPHAGE) 500 MG tablet Take 500 mg by mouth 2 (two) times daily with a meal.   . Multiple Vitamin (MULTIVITAMIN) tablet Take 1 tablet by mouth daily.  . Omega-3 Fatty Acids (FISH OIL) 1000 MG CAPS Take 1,000 mg by mouth 3 (three) times daily.  . pantoprazole (PROTONIX) 40 MG tablet Take 40 mg by mouth daily.  . pravastatin (PRAVACHOL) 80 MG tablet Take 80 mg by mouth daily.  Marland Kitchen senna (SENOKOT) 8.6 MG TABS tablet Take 2 tablets (17.2 mg total) by mouth daily.  . tamsulosin (FLOMAX) 0.4 MG CAPS capsule Take 0.4 mg  by mouth daily after supper.   No facility-administered encounter medications on file as of 08/23/2016.      Review of Systems  Immunization History  Administered Date(s) Administered  . Influenza,inj,Quad PF,36+ Mos 03/07/2015  . Influenza-Unspecified 02/03/2016  . PPD Test 03/10/2015, 03/24/2015  . Pneumococcal-Unspecified 02/10/2016   Pertinent  Health Maintenance Due  Topic Date Due  . FOOT EXAM  09/11/2016 (Originally 08/31/1944)  . INFLUENZA VACCINE  12/01/2016  . HEMOGLOBIN A1C  12/23/2016  . OPHTHALMOLOGY EXAM  02/09/2017  . PNA vac Low Risk Adult (2 of 2 - PCV13) 02/09/2017  . URINE MICROALBUMIN  06/25/2017   No flowsheet data found. Functional Status Survey:    Vitals:   08/23/16 1015  BP: 130/70  Pulse: 63  Resp: 20  Temp: 98.2 F (36.8 C)  TempSrc: Oral  SpO2: 99%   There is no height or weight on file to calculate BMI. Physical Exam  Labs reviewed:  Recent Labs  05/14/16 0700 07/26/16 0700 08/16/16 1430  NA 137 140 138  K 3.7 3.8 3.5  CL 105 107 106  CO2 '25 25 22  '$ GLUCOSE 93 105* 145*  BUN '10 12 13  '$ CREATININE 0.83 0.83 0.89  CALCIUM 9.1 9.0 8.5*    Recent Labs  10/31/15 1502 06/25/16 0700 07/26/16 0700 08/16/16 1430  AST 17  --  17 21  ALT 18  --  13* 18  ALKPHOS 82  --  53 54  BILITOT 0.4  --  0.5 0.2*  PROT 7.9  --  6.9 6.8  ALBUMIN 4.0 3.9 3.4* 3.4*    Recent Labs  01/14/16 0715  05/14/16 0700 07/26/16 0700 08/16/16 1430  WBC 12.2*  < > 7.4 7.8 10.2  NEUTROABS 8.4*  --   --  4.0 7.1  HGB 11.2*  < > 11.7* 12.0* 11.4*  HCT 34.1*  < > 34.3* 35.4* 33.3*  MCV 84.6  < > 84.3 85.9 84.9  PLT 229  < > 376 309 276  < > = values in this interval not displayed. Lab Results  Component Value Date   TSH 1.064 10/31/2015   Lab Results  Component Value Date   HGBA1C 6.8 (H) 06/25/2016   Lab Results  Component Value Date   CHOL 124 03/15/2016   HDL 33 (L) 03/15/2016   LDLCALC 76 03/15/2016   TRIG 75 03/15/2016   CHOLHDL  3.8 03/15/2016    Significant Diagnostic Results in last 30 days:  No results found.  Assessment/Plan There are no diagnoses linked to this encounter.   Family/ staff Communication:   Labs/tests ordered:       This encounter was created in error - please disregard.

## 2016-08-24 ENCOUNTER — Encounter: Payer: Self-pay | Admitting: Internal Medicine

## 2016-08-24 NOTE — Progress Notes (Signed)
Location:   [facility] Room Number: 104/W Place of Service:  SNF [phone]) Provider:  Clydene Fake, MD  Patient Care Team: [name], MD as PCP - General (Internal Medicine) Danie Binder, MD as Consulting Physician (Gastroenterology)  Extended Emergency Contact Information Primary Emergency Contact: Dorathy Kinsman States of [place] Phone: [phone] Relation: Daughter Secondary Emergency Contact: [place] of [place] Phone: [phone] Relation: Daughter  Code Status:  DNR Goals of care: Advanced Directive information Advanced Directives 08/24/2016  Does Patient Have a Medical Advance Directive? Yes  Type of Advance Directive Out of facility DNR (pink MOST or yellow form)  Does patient want to make changes to medical advance directive? No - Patient declined  Copy of [place] in Chart? -  Would patient like information on creating a medical advance directive? -     Chief Complaint  Patient presents with  . Medical Management of Chronic Issues    Routine Visit    HPI:  Pt is a 82 y.o. male seen today for medical management of chronic diseases.     Past Medical History:  Diagnosis Date  . Anemia due to chronic blood loss   . CAD (coronary artery disease)    cath 11/2007: 70% LM ISR, SVG-LAD OK, SVG-D1 100%, IMA-D2 OK, SVG-OM OK, 4-vessel CABG 2000.  Marland Kitchen CAD (coronary artery disease)    s/p of 95% L main artery stenosis, 4/03: cutting balloon PTCA of 80% in-stent restenosis, 7/04. preserved L ventriculr function  . Carotid bruit    bilateral. no sigificant distal abd atherosclerosis by recent cath.   . Chronic bronchitis ([place])   . Constipation   . COPD (chronic obstructive pulmonary disease) (HCC)    ongoing tobacco   . CVA (cerebral infarction)   . DM2 (diabetes mellitus, type 2) ([place])   . Dyslipidemia   . Dysphagia   . Glaucoma   . HTN (hypertension)    Past  Surgical History:  Procedure Laterality Date  . CARDIAC CATHETERIZATION    . COLONOSCOPY N/A 05/14/2015   POOR PREP  . COLONOSCOPY N/A 06/02/2015   SLF: 1. four colorectal polyps removed. no source for anemia identified. 2. the left colon is redundant 3. moderate sized internal hemorroids.   . ESOPHAGOGASTRODUODENOSCOPY N/A 05/14/2015   CHRONIC GASTRITIS  . GIVENS CAPSULE STUDY N/A 06/17/2015   Procedure: GIVENS CAPSULE STUDY;  Surgeon: Danie Binder, MD;  [facility];  Service: Endoscopy;  Laterality: N/A;  0800    No Known Allergies  Outpatient Encounter Prescriptions as of 08/24/2016  Medication Sig  . amLODipine (NORVASC) 2.5 MG tablet Take 5 mg by mouth daily.   . Calcium Carbonate-Vitamin D (OSCAL 500/200 D-3 PO) 1 Tablet once a day by mouth . Can't be given with iron  . clopidogrel (PLAVIX) 75 MG tablet Take 75 mg by mouth daily.  Marland Kitchen docusate sodium (COLACE) 100 MG capsule Take 100 mg by mouth 2 (two) times daily.  . ferrous sulfate 325 (65 FE) MG tablet Take 1 tablet (325 mg total) by mouth 3 (three) times daily with meals.  Marland Kitchen labetalol (NORMODYNE) 200 MG tablet Take 200 mg by mouth 3 (three) times daily. Reported on 10/22/2015  . linaclotide (LINZESS) 72 MCG capsule Take 72 mcg by mouth daily before breakfast.  . lisinopril (PRINIVIL,ZESTRIL) 40 MG tablet Take 40 mg by mouth daily.  Marland Kitchen loratadine (CLARITIN) 10 MG tablet Take 10 mg by  mouth daily.  . metFORMIN (GLUCOPHAGE) 500 MG tablet Take 500 mg by mouth 2 (two) times daily with a meal.   . Multiple Vitamin (MULTIVITAMIN) tablet Take 1 tablet by mouth daily.  . Omega-3 Fatty Acids (FISH OIL) 1000 MG CAPS Take 1,000 mg by mouth 3 (three) times daily.  . pantoprazole (PROTONIX) 40 MG tablet Take 40 mg by mouth daily.  . pravastatin (PRAVACHOL) 80 MG tablet Take 80 mg by mouth daily.  Marland Kitchen senna (SENOKOT) 8.6 MG TABS tablet Take 2 tablets (17.2 mg total) by mouth daily.  . tamsulosin (FLOMAX) 0.4 MG CAPS capsule Take 0.4 mg  by mouth daily after supper.   No facility-administered encounter medications on file as of 08/24/2016.      Review of Systems  Immunization History  Administered Date(s) Administered  . Influenza,inj,Quad PF,36+ Mos 03/07/2015  . Influenza-Unspecified 02/03/2016  . PPD Test 03/10/2015, 03/24/2015  . Pneumococcal-Unspecified 02/10/2016   Pertinent  Health Maintenance Due  Topic Date Due  . FOOT EXAM  09/11/2016 (Originally 08/31/1944)  . INFLUENZA VACCINE  12/01/2016  . HEMOGLOBIN A1C  12/23/2016  . OPHTHALMOLOGY EXAM  02/09/2017  . PNA vac Low Risk Adult (2 of 2 - PCV13) 02/09/2017  . URINE MICROALBUMIN  06/25/2017   No flowsheet data found. Functional Status Survey:    Vitals:   08/24/16 1047  BP: 130/70  Pulse: 63  Resp: 20  Temp: 98.2 F (36.8 C)  TempSrc: Oral  SpO2: 99%   There is no height or weight on file to calculate BMI. Physical Exam  Labs reviewed:  Recent Labs  05/14/16 0700 07/26/16 0700 08/16/16 1430  NA 137 140 138  K 3.7 3.8 3.5  CL 105 107 106  CO2 '25 25 22  '$ GLUCOSE 93 105* 145*  BUN '10 12 13  '$ CREATININE 0.83 0.83 0.89  CALCIUM 9.1 9.0 8.5*    Recent Labs  10/31/15 1502 06/25/16 0700 07/26/16 0700 08/16/16 1430  AST 17  --  17 21  ALT 18  --  13* 18  ALKPHOS 82  --  53 54  BILITOT 0.4  --  0.5 0.2*  PROT 7.9  --  6.9 6.8  ALBUMIN 4.0 3.9 3.4* 3.4*    Recent Labs  01/14/16 0715  05/14/16 0700 07/26/16 0700 08/16/16 1430  WBC 12.2*  < > 7.4 7.8 10.2  NEUTROABS 8.4*  --   --  4.0 7.1  HGB 11.2*  < > 11.7* 12.0* 11.4*  HCT 34.1*  < > 34.3* 35.4* 33.3*  MCV 84.6  < > 84.3 85.9 84.9  PLT 229  < > 376 309 276  < > = values in this interval not displayed. Lab Results  Component Value Date   TSH 1.064 10/31/2015   Lab Results  Component Value Date   HGBA1C 6.8 (H) 06/25/2016   Lab Results  Component Value Date   CHOL 124 03/15/2016   HDL 33 (L) 03/15/2016   LDLCALC 76 03/15/2016   TRIG 75 03/15/2016   CHOLHDL  3.8 03/15/2016    Significant Diagnostic Results in last 30 days:  No results found.  Assessment/Plan There are no diagnoses linked to this encounter.   Family/ staff Communication:   Labs/tests ordered:       This encounter was created in error - please disregard.

## 2016-08-31 ENCOUNTER — Encounter (HOSPITAL_COMMUNITY)
Admission: RE | Admit: 2016-08-31 | Discharge: 2016-08-31 | Disposition: A | Discharge status: Home / Self Care | Payer: Medicare Other | Source: Skilled Nursing Facility | Attending: Internal Medicine | Admitting: Internal Medicine

## 2016-08-31 ENCOUNTER — Non-Acute Institutional Stay (SKILLED_NURSING_FACILITY): Payer: Medicare Other | Admitting: Internal Medicine

## 2016-08-31 ENCOUNTER — Encounter: Payer: Self-pay | Admitting: Internal Medicine

## 2016-08-31 DIAGNOSIS — I10 Essential (primary) hypertension: Secondary | ICD-10-CM

## 2016-08-31 DIAGNOSIS — F5089 Other specified eating disorder: Secondary | ICD-10-CM | POA: Insufficient documentation

## 2016-08-31 DIAGNOSIS — N4289 Other specified disorders of prostate: Secondary | ICD-10-CM | POA: Insufficient documentation

## 2016-08-31 DIAGNOSIS — E785 Hyperlipidemia, unspecified: Secondary | ICD-10-CM

## 2016-08-31 DIAGNOSIS — D500 Iron deficiency anemia secondary to blood loss (chronic): Secondary | ICD-10-CM | POA: Diagnosis not present

## 2016-08-31 DIAGNOSIS — K5900 Constipation, unspecified: Secondary | ICD-10-CM

## 2016-08-31 DIAGNOSIS — I2510 Atherosclerotic heart disease of native coronary artery without angina pectoris: Secondary | ICD-10-CM

## 2016-08-31 DIAGNOSIS — K559 Vascular disorder of intestine, unspecified: Secondary | ICD-10-CM | POA: Insufficient documentation

## 2016-08-31 DIAGNOSIS — M6281 Muscle weakness (generalized): Secondary | ICD-10-CM | POA: Diagnosis not present

## 2016-08-31 DIAGNOSIS — E1151 Type 2 diabetes mellitus with diabetic peripheral angiopathy without gangrene: Secondary | ICD-10-CM

## 2016-08-31 LAB — CBC WITH DIFFERENTIAL/PLATELET
Basophils Absolute: 0 10*3/uL (ref 0.0–0.1)
Basophils Relative: 1 %
Eosinophils Absolute: 0.5 10*3/uL (ref 0.0–0.7)
Eosinophils Relative: 6 %
HEMATOCRIT: 36.9 % — AB (ref 39.0–52.0)
HEMOGLOBIN: 12 g/dL — AB (ref 13.0–17.0)
LYMPHS ABS: 2.4 10*3/uL (ref 0.7–4.0)
Lymphocytes Relative: 28 %
MCH: 28.2 pg (ref 26.0–34.0)
MCHC: 32.5 g/dL (ref 30.0–36.0)
MCV: 86.6 fL (ref 78.0–100.0)
MONO ABS: 0.4 10*3/uL (ref 0.1–1.0)
MONOS PCT: 5 %
NEUTROS ABS: 5.3 10*3/uL (ref 1.7–7.7)
NEUTROS PCT: 60 %
Platelets: 357 10*3/uL (ref 150–400)
RBC: 4.26 MIL/uL (ref 4.22–5.81)
RDW: 13.8 % (ref 11.5–15.5)
WBC: 8.6 10*3/uL (ref 4.0–10.5)

## 2016-08-31 LAB — BASIC METABOLIC PANEL
ANION GAP: 7 (ref 5–15)
BUN: 17 mg/dL (ref 6–20)
CO2: 26 mmol/L (ref 22–32)
Calcium: 9.2 mg/dL (ref 8.9–10.3)
Chloride: 105 mmol/L (ref 101–111)
Creatinine, Ser: 0.87 mg/dL (ref 0.61–1.24)
GFR calc Af Amer: >60 mL/min (ref >60)
GLUCOSE: 120 mg/dL — AB (ref 65–99)
POTASSIUM: 3.9 mmol/L (ref 3.5–5.1)
Sodium: 138 mmol/L (ref 135–145)

## 2016-08-31 LAB — FERRITIN: Ferritin: 31 ng/mL (ref 24–336)

## 2016-08-31 NOTE — Progress Notes (Signed)
Location:   [facility] Room Number: 104/W Place of Service:  SNF [phone]) Provider:  Clydene Fake, MD  Patient Care Team: [name], MD as PCP - General (Internal Medicine) Danie Binder, MD as Consulting Physician (Gastroenterology)  Extended Emergency Contact Information Primary Emergency Contact: Dorathy Kinsman [place] of [place] Phone: [phone] Relation: Daughter Secondary Emergency Contact: [place] of [place] Phone: [phone] Relation: Daughter  Code Status:  DNR Goals of care: Advanced Directive information Advanced Directives 08/31/2016  Does Patient Have a Medical Advance Directive? Yes  Type of Advance Directive Out of facility DNR (pink MOST or yellow form)  Does patient want to make changes to medical advance directive? No - Patient declined  Copy of [place] in Chart? -  Would patient like information on creating a medical advance directive? -     Chief Complaint  Patient presents with  . Medical Management of Chronic Issues    Routine Visit    HPI:  Pt is a 82 y.o. male seen today for medical management of chronic diseases.   Patient has H/O GI bleed, Diabetes type 2 S/P CVA , Hypertension, Hyperlipidemia, constipation. Poor vision . CAD S/P CABG in 2000 and PCI of Left Main on 2003 and Carotid artery stenosis.with complete occlusion of RCA.  Since my Lsst visit patient had one episode of Hypertensive urgency with SBP of almost 190. He required 2 doses of Clonidine and his Norvasc dose was increased. Patient also Had Upper respiratory infection at that time treated conservatively.  He also have seen Cardiology for his RCA stenosis with no new recommendations. He follows with Gertie Fey for Constipation and Anemia. Patient is doing well now. His cough is resolved. His BP has been stable since then.  Weight stable at 185 lbs BS less then 200    Past  Medical History:  Diagnosis Date  . Anemia due to chronic blood loss   . CAD (coronary artery disease)    cath 11/2007: 70% LM ISR, SVG-LAD OK, SVG-D1 100%, IMA-D2 OK, SVG-OM OK, 4-vessel CABG 2000.  Marland Kitchen CAD (coronary artery disease)    s/p of 95% L main artery stenosis, 4/03: cutting balloon PTCA of 80% in-stent restenosis, 7/04. preserved L ventriculr function  . Carotid bruit    bilateral. no sigificant distal abd atherosclerosis by recent cath.   . Chronic bronchitis ([place])   . Constipation   . COPD (chronic obstructive pulmonary disease) (HCC)    ongoing tobacco   . CVA (cerebral infarction)   . DM2 (diabetes mellitus, type 2) ([place])   . Dyslipidemia   . Dysphagia   . Glaucoma   . HTN (hypertension)    Past Surgical History:  Procedure Laterality Date  . CARDIAC CATHETERIZATION    . COLONOSCOPY N/A 05/14/2015   POOR PREP  . COLONOSCOPY N/A 06/02/2015   SLF: 1. four colorectal polyps removed. no source for anemia identified. 2. the left colon is redundant 3. moderate sized internal hemorroids.   . ESOPHAGOGASTRODUODENOSCOPY N/A 05/14/2015   CHRONIC GASTRITIS  . GIVENS CAPSULE STUDY N/A 06/17/2015   Procedure: GIVENS CAPSULE STUDY;  Surgeon: Danie Binder, MD;  Location: [facility];  Service: Endoscopy;  Laterality: N/A;  0800    No Known Allergies  Outpatient Encounter Prescriptions as of 08/31/2016  Medication Sig  . amLODipine (NORVASC) 2.5 MG tablet Take 5 mg by mouth daily.   . Calcium Carbonate-Vitamin D (OSCAL 500/200  D-3 PO) 1 Tablet once a day by mouth . Can't be given with iron  . clopidogrel (PLAVIX) 75 MG tablet Take 75 mg by mouth daily.  Marland Kitchen docusate sodium (COLACE) 100 MG capsule Take 100 mg by mouth 2 (two) times daily.  . ferrous sulfate 325 (65 FE) MG tablet Take 1 tablet (325 mg total) by mouth 3 (three) times daily with meals.  Marland Kitchen labetalol (NORMODYNE) 200 MG tablet Take 200 mg by mouth 3 (three) times daily. Reported on 10/22/2015  . linaclotide (LINZESS) 72  MCG capsule Take 72 mcg by mouth daily before breakfast.  . lisinopril (PRINIVIL,ZESTRIL) 40 MG tablet Take 40 mg by mouth daily.  . metFORMIN (GLUCOPHAGE) 500 MG tablet Take 500 mg by mouth 2 (two) times daily with a meal.   . Multiple Vitamin (MULTIVITAMIN) tablet Take 1 tablet by mouth daily.  . Omega-3 Fatty Acids (FISH OIL) 1000 MG CAPS Take 1,000 mg by mouth 3 (three) times daily.  . pantoprazole (PROTONIX) 40 MG tablet Take 40 mg by mouth daily.  . pravastatin (PRAVACHOL) 80 MG tablet Take 80 mg by mouth daily.  Marland Kitchen senna (SENOKOT) 8.6 MG TABS tablet Take 2 tablets (17.2 mg total) by mouth daily.  . tamsulosin (FLOMAX) 0.4 MG CAPS capsule Take 0.4 mg by mouth daily after supper.  . [DISCONTINUED] loratadine (CLARITIN) 10 MG tablet Take 10 mg by mouth daily.   No facility-administered encounter medications on file as of 08/31/2016.      Review of Systems  Review of Systems  Constitutional: Negative for activity change, appetite change, chills, diaphoresis, fatigue and fever.  HENT: Negative for mouth sores, postnasal drip, rhinorrhea, sinus pain and sore throat.   Respiratory: Negative for apnea, cough, chest tightness, shortness of breath and wheezing.   Cardiovascular: Negative for chest pain, palpitations and leg swelling.  Gastrointestinal: Negative for abdominal distention, abdominal pain, constipation, diarrhea, nausea and vomiting.  Genitourinary: Negative for dysuria and frequency.  Musculoskeletal: Negative for arthralgias, joint swelling and myalgias.  Skin: Negative for rash.  Neurological: Negative for dizziness, syncope, weakness, light-headedness and numbness.  Psychiatric/Behavioral: Negative for behavioral problems, confusion and sleep disturbance.     Immunization History  Administered Date(s) Administered  . Influenza,inj,Quad PF,36+ Mos 03/07/2015  . Influenza-Unspecified 02/03/2016  . PPD Test 03/10/2015, 03/24/2015  . Pneumococcal-Unspecified 02/10/2016    Pertinent  Health Maintenance Due  Topic Date Due  . FOOT EXAM  09/11/2016 (Originally 08/31/1944)  . INFLUENZA VACCINE  12/01/2016  . HEMOGLOBIN A1C  12/23/2016  . OPHTHALMOLOGY EXAM  02/09/2017  . PNA vac Low Risk Adult (2 of 2 - PCV13) 02/09/2017  . URINE MICROALBUMIN  06/25/2017   No flowsheet data found. Functional Status Survey:    Vitals:   08/31/16 1359  BP: (!) 143/63  Pulse: (!) 56  Resp: 16  Temp: 98.7 F (37.1 C)  TempSrc: Oral   There is no height or weight on file to calculate BMI. Physical Exam  Constitutional: He appears well-developed and well-nourished.  HENT:  Head: Normocephalic.  Mouth/Throat: Oropharynx is clear and moist.  Neck: Neck supple.  Cardiovascular: Normal rate, regular rhythm and normal heart sounds.   No murmur heard. Pulmonary/Chest: Effort normal and breath sounds normal. No respiratory distress. He has no wheezes. He has no rales.  Abdominal: Soft. Bowel sounds are normal. He exhibits no distension. There is no tenderness.  Musculoskeletal:  Mild edema B/l  Neurological: He is alert.  Skin: Skin is warm and dry.  Psychiatric: He  has a normal mood and affect. His behavior is normal. Thought content normal.    Labs reviewed:  Recent Labs  07/26/16 0700 08/16/16 1430 08/31/16 0700  NA 140 138 138  K 3.8 3.5 3.9  CL 107 106 105  CO2 '25 22 26  '$ GLUCOSE 105* 145* 120*  BUN '12 13 17  '$ CREATININE 0.83 0.89 0.87  CALCIUM 9.0 8.5* 9.2    Recent Labs  10/31/15 1502 06/25/16 0700 07/26/16 0700 08/16/16 1430  AST 17  --  17 21  ALT 18  --  13* 18  ALKPHOS 82  --  53 54  BILITOT 0.4  --  0.5 0.2*  PROT 7.9  --  6.9 6.8  ALBUMIN 4.0 3.9 3.4* 3.4*    Recent Labs  07/26/16 0700 08/16/16 1430 08/31/16 0700  WBC 7.8 10.2 8.6  NEUTROABS 4.0 7.1 5.3  HGB 12.0* 11.4* 12.0*  HCT 35.4* 33.3* 36.9*  MCV 85.9 84.9 86.6  PLT 309 276 357   Lab Results  Component Value Date   TSH 1.064 10/31/2015   Lab Results  Component  Value Date   HGBA1C 6.8 (H) 06/25/2016   Lab Results  Component Value Date   CHOL 124 03/15/2016   HDL 33 (L) 03/15/2016   LDLCALC 76 03/15/2016   TRIG 75 03/15/2016   CHOLHDL 3.8 03/15/2016    Significant Diagnostic Results in last 30 days:  No results found.  Assessment/Plan Essential hypertension BP has been stable since his Norvasc was increased to 5 mg. He is also on labetalol,Lisinopril  DM (diabetes mellitus)  BS running stable A1C is 6.8 Continue Glucophage Already on Ace inhibitor for Microalmuniuria  Constipation,  On Linzess Hyperlipidemia,  LDL 76 in 11/17 Anemia due to chronic blood loss Hgb stable actually it is 12 almost normal.  CAD On Plavix, Beta blocker and Statin s/p CVA Continue Plavix. Family/ staff Communication:   Labs/tests ordered:    Total time spent in this patient care encounter was 25_ minutes; greater than 50% of the visit spent counseling patient and coordinating care for problems addressed at this encounter.

## 2016-09-07 DIAGNOSIS — B351 Tinea unguium: Secondary | ICD-10-CM | POA: Diagnosis not present

## 2016-09-07 DIAGNOSIS — E1159 Type 2 diabetes mellitus with other circulatory complications: Secondary | ICD-10-CM | POA: Diagnosis not present

## 2016-09-07 DIAGNOSIS — L84 Corns and callosities: Secondary | ICD-10-CM | POA: Diagnosis not present

## 2016-09-07 DIAGNOSIS — I739 Peripheral vascular disease, unspecified: Secondary | ICD-10-CM | POA: Diagnosis not present

## 2016-09-30 DIAGNOSIS — I639 Cerebral infarction, unspecified: Secondary | ICD-10-CM | POA: Diagnosis not present

## 2016-09-30 DIAGNOSIS — R1312 Dysphagia, oropharyngeal phase: Secondary | ICD-10-CM | POA: Diagnosis not present

## 2016-09-30 DIAGNOSIS — M6281 Muscle weakness (generalized): Secondary | ICD-10-CM | POA: Diagnosis not present

## 2016-10-01 DIAGNOSIS — M6281 Muscle weakness (generalized): Secondary | ICD-10-CM | POA: Diagnosis not present

## 2016-10-01 DIAGNOSIS — I639 Cerebral infarction, unspecified: Secondary | ICD-10-CM | POA: Diagnosis not present

## 2016-10-01 DIAGNOSIS — R279 Unspecified lack of coordination: Secondary | ICD-10-CM | POA: Diagnosis not present

## 2016-10-04 DIAGNOSIS — M6281 Muscle weakness (generalized): Secondary | ICD-10-CM | POA: Diagnosis not present

## 2016-10-04 DIAGNOSIS — R279 Unspecified lack of coordination: Secondary | ICD-10-CM | POA: Diagnosis not present

## 2016-10-04 DIAGNOSIS — I639 Cerebral infarction, unspecified: Secondary | ICD-10-CM | POA: Diagnosis not present

## 2016-10-05 DIAGNOSIS — R279 Unspecified lack of coordination: Secondary | ICD-10-CM | POA: Diagnosis not present

## 2016-10-05 DIAGNOSIS — M6281 Muscle weakness (generalized): Secondary | ICD-10-CM | POA: Diagnosis not present

## 2016-10-05 DIAGNOSIS — I639 Cerebral infarction, unspecified: Secondary | ICD-10-CM | POA: Diagnosis not present

## 2016-10-06 ENCOUNTER — Non-Acute Institutional Stay (SKILLED_NURSING_FACILITY): Payer: Medicare Other | Admitting: Internal Medicine

## 2016-10-06 ENCOUNTER — Encounter: Payer: Self-pay | Admitting: Internal Medicine

## 2016-10-06 DIAGNOSIS — R279 Unspecified lack of coordination: Secondary | ICD-10-CM | POA: Diagnosis not present

## 2016-10-06 DIAGNOSIS — I10 Essential (primary) hypertension: Secondary | ICD-10-CM

## 2016-10-06 DIAGNOSIS — E785 Hyperlipidemia, unspecified: Secondary | ICD-10-CM

## 2016-10-06 DIAGNOSIS — M6281 Muscle weakness (generalized): Secondary | ICD-10-CM | POA: Diagnosis not present

## 2016-10-06 DIAGNOSIS — I639 Cerebral infarction, unspecified: Secondary | ICD-10-CM | POA: Diagnosis not present

## 2016-10-06 DIAGNOSIS — I2510 Atherosclerotic heart disease of native coronary artery without angina pectoris: Secondary | ICD-10-CM | POA: Diagnosis not present

## 2016-10-06 DIAGNOSIS — Z8673 Personal history of transient ischemic attack (TIA), and cerebral infarction without residual deficits: Secondary | ICD-10-CM | POA: Diagnosis not present

## 2016-10-06 DIAGNOSIS — E113593 Type 2 diabetes mellitus with proliferative diabetic retinopathy without macular edema, bilateral: Secondary | ICD-10-CM | POA: Diagnosis not present

## 2016-10-06 DIAGNOSIS — D500 Iron deficiency anemia secondary to blood loss (chronic): Secondary | ICD-10-CM | POA: Diagnosis not present

## 2016-10-06 NOTE — Progress Notes (Signed)
Location:   [facility] Room Number: 104/W Place of Service:  SNF (31) Provider:  Gardiner Fanti, Rene Kocher, MD  Patient Care Team: [name], MD as PCP - General (Internal Medicine) Danie Binder, MD as Consulting Physician (Gastroenterology)  Extended Emergency Contact Information Primary Emergency Contact: Dorathy Kinsman States of [place] Phone: [phone] Relation: Daughter Secondary Emergency Contact: [place] of [place] Phone: [phone] Relation: Daughter  Code Status:  DNR Goals of care: Advanced Directive information Advanced Directives 10/06/2016  Does Patient Have a Medical Advance Directive? Yes  Type of Advance Directive Out of facility DNR (pink MOST or yellow form)  Does patient want to make changes to medical advance directive? No - Patient declined  Copy of [place] in Chart? -  Would patient like information on creating a medical advance directive? -     Chief Complaint  Patient presents with  . Medical Management of Chronic Issues    Routine Visit   Her medical management of chronic medical conditions including coronary artery disease status post CABG-diabetes type 2-history CVA-hypertension-constipation-hyperlipidemia and poor vision  HPI:  Pt is a 82 y.o. male seen today for medical management of chronic diseases as noted above.  He continues to enjoy.. If stability.  When he first came here there were significant issues including failure to thrive and weight loss but this is turned around significantly his weights now stable in the mid 180s he eats very well --.  He also has a history of GI bleed with AVM malformations is followed by GI and this is been essentially stable for extended period of time he is on iron and hemoglobin last month was 12.0 which show stability-improvement.  In regards to coronary artery disease he has been followed by cardiology  and thought to be stable he does have a history of a CABG in 2000 and PCI of the left main coronary artery back in 2003.  Also history of carotid artery stenosis with complete occlusion of the right RCA however this thought to be stable he is on Plavix beta blocker as well as an ACE inhibitor.  He does have history CVA of the right cerebellum left greater than right basal ganglia brainstem and right greater than left carotid radiate-this has been stable with supportive care he is on Plavix a stat.  In regards type 2 diabetes this is stable as well he is on Glucophage blood sugars appear to be 99-1 50 1 in the AM--later in the day ranges from 91 up to 170.  Hemoglobin A1c in February was 6.8.  He has been seen several weeks ago for hypertensive urgency with systolic blood pressure at 180 he was given clonidine and responded his Norvasc was increased and this appears to be stabilized blood pressure today is 124/60 8I see previous reading of 138/72 he is on Norvasc 5 mg a day now is also on lisinopril 40 mg a day as well as labetalol 300 mg 3 times a day.  He also has a history of constipation cement followed by GI and they were concerned enough to start him on Linzess he is also on Colace twice a day and this has stabilized it appears cannot recall the last time that was an issue which is encouraging.  He also has a history of BPH on Flomax.  He is legally blind but is doing very well with supportive care he is great Voice recognition and I  believe he does see shadows     Past Medical History:  Diagnosis Date  . Anemia due to chronic blood loss   . CAD (coronary artery disease)    cath 11/2007: 70% LM ISR, SVG-LAD OK, SVG-D1 100%, IMA-D2 OK, SVG-OM OK, 4-vessel CABG 2000.  Marland Kitchen CAD (coronary artery disease)    s/p of 95% L main artery stenosis, 4/03: cutting balloon PTCA of 80% in-stent restenosis, 7/04. preserved L ventriculr function  . Carotid bruit    bilateral. no sigificant distal abd  atherosclerosis by recent cath.   . Chronic bronchitis ([place])   . Constipation   . COPD (chronic obstructive pulmonary disease) (HCC)    ongoing tobacco   . CVA (cerebral infarction)   . DM2 (diabetes mellitus, type 2) ([place])   . Dyslipidemia   . Dysphagia   . Glaucoma   . HTN (hypertension)    Past Surgical History:  Procedure Laterality Date  . CARDIAC CATHETERIZATION    . COLONOSCOPY N/A 05/14/2015   POOR PREP  . COLONOSCOPY N/A 06/02/2015   SLF: 1. four colorectal polyps removed. no source for anemia identified. 2. the left colon is redundant 3. moderate sized internal hemorroids.   . ESOPHAGOGASTRODUODENOSCOPY N/A 05/14/2015   CHRONIC GASTRITIS  . GIVENS CAPSULE STUDY N/A 06/17/2015   Procedure: GIVENS CAPSULE STUDY;  Surgeon: Danie Binder, MD;  Location[facility];  Service: Endoscopy;  Laterality: N/A;  0800    No Known Allergies  Outpatient Encounter Prescriptions as of 10/06/2016  Medication Sig  . amLODipine (NORVASC) 2.5 MG tablet Take 5 mg by mouth daily.   . Calcium Carbonate-Vitamin D (OSCAL 500/200 D-3 PO) 1 Tablet once a day by mouth . Can't be given with iron  . clopidogrel (PLAVIX) 75 MG tablet Take 75 mg by mouth daily.  Marland Kitchen docusate sodium (COLACE) 100 MG capsule Take 100 mg by mouth 2 (two) times daily.  . ferrous sulfate 325 (65 FE) MG tablet Take 1 tablet (325 mg total) by mouth 3 (three) times daily with meals.  Marland Kitchen labetalol (NORMODYNE) 200 MG tablet Take 200 mg by mouth 3 (three) times daily. Reported on 10/22/2015  . linaclotide (LINZESS) 72 MCG capsule Take 72 mcg by mouth daily before breakfast.  . lisinopril (PRINIVIL,ZESTRIL) 40 MG tablet Take 40 mg by mouth daily.  . metFORMIN (GLUCOPHAGE) 500 MG tablet Take 500 mg by mouth 2 (two) times daily with a meal.   . Multiple Vitamin (MULTIVITAMIN) tablet Take 1 tablet by mouth daily.  . Omega-3 Fatty Acids (FISH OIL) 1000 MG CAPS Take 1,000 mg by mouth 3 (three) times daily.  . pantoprazole (PROTONIX) 40 MG  tablet Take 40 mg by mouth daily.  . pravastatin (PRAVACHOL) 80 MG tablet Take 80 mg by mouth daily.  Marland Kitchen senna (SENOKOT) 8.6 MG TABS tablet Take 2 tablets (17.2 mg total) by mouth daily.  . tamsulosin (FLOMAX) 0.4 MG CAPS capsule Take 0.4 mg by mouth daily after supper.   No facility-administered encounter medications on file as of 10/06/2016.     Review of Systems In general does not complain of any fever or chills has hadt weight gain which appears to have stabilized around the mid 180s  Skin does not complain of rashes or itching.  Head ears eyes nose mouth and throat does not complaining sore throat-he is legally blind says he sees shadows--this appears to be baseline  Resp--does not complain of shortness breath or cough.  Cardiac no chest pain.Or significant lower  extremity edema  GI is not complaining of any nausea vomiting diarrhea or constipation does have a history of constipation in the past is not complaining of any abdominal discomfort.  Muscle skeletal is not complaining of joint pain.  Neurologic does not complain of dizziness or headache or syncope.  Psych nursing staff does not report any behavior issues continues to be pleasant cooperative talkative and in good spirits  Immunization History  Administered Date(s) Administered  . Influenza,inj,Quad PF,36+ Mos 03/07/2015  . Influenza-Unspecified 02/03/2016  . PPD Test 03/10/2015, 03/24/2015  . Pneumococcal-Unspecified 02/10/2016   Pertinent  Health Maintenance Due  Topic Date Due  . FOOT EXAM  08/31/1944  . INFLUENZA VACCINE  12/01/2016  . HEMOGLOBIN A1C  12/23/2016  . OPHTHALMOLOGY EXAM  02/09/2017  . PNA vac Low Risk Adult (2 of 2 - PCV13) 02/09/2017  . URINE MICROALBUMIN  06/25/2017   No flowsheet data found. Functional Status Survey:    Vitals:   10/06/16 1425  BP: 124/68  Pulse: 60  Resp: 18  Temp: 98.1 F (36.7 C)  TempSrc: Oral  SpO2: 98%  Weight is stable at 183.4  POUNDS  Physical Exam   Gen. this is a very pleasant elderly male in no distress sitting comfortably in his wheelchair continues to be in good spirits jovial.  His skin is warm and dry.  Eyes does have a history of legally blindness with some vision apparently in his right eye left eye has an opacity says he sees samples  Oropharynx clear mucous membranes moist.  Chest is clear to auscultation there is no labored breathing.  Heart is regular rate and rhythm with a 2/6 systolic murmur as very minimal lower extremity edema.  His abdomen soft nontender somewhat obese with positive bowel sounds.  Muscle skeletal moves all extremities at baseline with a history of minimal left-sided weakness--largely ambulates in a wheelchair.  Neurologic he continues with left facial weakness and speech is relatively clear however has some left-sided weakness as noted above but he is doing where he well with supportive care here.  Psych he is grossly alert and oriented pleasant and appropriate in good spirits jovial person  Labs reviewed:  Recent Labs  07/26/16 0700 08/16/16 1430 08/31/16 0700  NA 140 138 138  K 3.8 3.5 3.9  CL 107 106 105  CO2 25 22 26   GLUCOSE 105* 145* 120*  BUN 12 13 17   CREATININE 0.83 0.89 0.87  CALCIUM 9.0 8.5* 9.2    Recent Labs  10/31/15 1502 06/25/16 0700 07/26/16 0700 08/16/16 1430  AST 17  --  17 21  ALT 18  --  13* 18  ALKPHOS 82  --  53 54  BILITOT 0.4  --  0.5 0.2*  PROT 7.9  --  6.9 6.8  ALBUMIN 4.0 3.9 3.4* 3.4*    Recent Labs  07/26/16 0700 08/16/16 1430 08/31/16 0700  WBC 7.8 10.2 8.6  NEUTROABS 4.0 7.1 5.3  HGB 12.0* 11.4* 12.0*  HCT 35.4* 33.3* 36.9*  MCV 85.9 84.9 86.6  PLT 309 276 357   Lab Results  Component Value Date   TSH 1.064 10/31/2015   Lab Results  Component Value Date   HGBA1C 6.8 (H) 06/25/2016   Lab Results  Component Value Date   CHOL 124 03/15/2016   HDL 33 (L) 03/15/2016   LDLCALC 76 03/15/2016   TRIG 75  03/15/2016   CHOLHDL 3.8 03/15/2016    Significant Diagnostic Results in last 30 days:  No  results found.  Assessment/Plan  #!-CVA continues to do well with supportive care he is on Plavix as well as pravastatin-.  #2 history coronary artery disease this is stable Plavix is also on labetalol lisinopril a statin LDL in November 2017 was 76  #3 diabetes type 2 this is quite stable on Glucophage as noted above hemoglobin A1c was 6.8 back in February will update this.  . #4 hypertension this was an issue at one point but Norvasc was increased appears to have responded well is on labetalol 200 mg 3 times a day-[place] 40 mg a day as well as Norvasc 5 mg a day.  #5 hyperlipidemia as noted above LDL is satisfactory 76 on lab done in November he is on low fish oil-continue on a statin.  6 history constipation point this apparently was an issue appears essentially resolved on the lLinzessas as well as Colace  #7 history BPH this is been consistently asymptomatic Flomax no recent issues that I am aware of any  #8 history of failure to thrive this appears to be somewhat of a distant past this certainly is not case here for some time weight is stable in the mid 180s.  #9 anemia secondary to GI AVM elevations he has been followed by GI is on iron and hemoglobin continues to show stability at 12.0 on lab done in May will monitor at periodic intervals this has   been stable now for some time  I do note he is also on a proton pump inhibitor  #10-history of carotid artery stenosis-Dopplers in January 2000 showed occlusion of the right common carotid artery less than 60% left internal carotid stenosis he is on Plavix as well as a statin he does have cardiology follow-up itt appears for early next year.  11-history of COPD-this has not really been in issue for a considerable amount of time at this point will monitor he does not complain of shortness breath nursing staff has not noted any  increased cough.    Again will update a hemoglobin A1c for updated values regards to anemia hemoglobin on recent lab appears to show stability renal function also appears to be stable but will monitor at periodic intervals.  [id number] greater than 35 minutes spent assessing patient-discussing his status with nursing staff-reviewing his chart-reviewing his labs-and coordinating and evaluating care for numerous diagnoses-notegreater than 50% of time spent coordinating plan of care

## 2016-10-07 ENCOUNTER — Encounter (HOSPITAL_COMMUNITY)
Admission: RE | Admit: 2016-10-07 | Discharge: 2016-10-07 | Disposition: A | Discharge status: Home / Self Care | Payer: Medicare Other | Source: Skilled Nursing Facility | Attending: Internal Medicine | Admitting: Internal Medicine

## 2016-10-07 DIAGNOSIS — M6281 Muscle weakness (generalized): Secondary | ICD-10-CM | POA: Diagnosis not present

## 2016-10-07 DIAGNOSIS — N4289 Other specified disorders of prostate: Secondary | ICD-10-CM | POA: Insufficient documentation

## 2016-10-07 DIAGNOSIS — D500 Iron deficiency anemia secondary to blood loss (chronic): Secondary | ICD-10-CM | POA: Diagnosis not present

## 2016-10-07 DIAGNOSIS — R279 Unspecified lack of coordination: Secondary | ICD-10-CM | POA: Diagnosis not present

## 2016-10-07 DIAGNOSIS — F5089 Other specified eating disorder: Secondary | ICD-10-CM | POA: Insufficient documentation

## 2016-10-07 DIAGNOSIS — K559 Vascular disorder of intestine, unspecified: Secondary | ICD-10-CM | POA: Diagnosis not present

## 2016-10-07 DIAGNOSIS — I639 Cerebral infarction, unspecified: Secondary | ICD-10-CM | POA: Diagnosis not present

## 2016-10-07 DIAGNOSIS — E119 Type 2 diabetes mellitus without complications: Secondary | ICD-10-CM | POA: Insufficient documentation

## 2016-10-08 DIAGNOSIS — I639 Cerebral infarction, unspecified: Secondary | ICD-10-CM | POA: Diagnosis not present

## 2016-10-08 DIAGNOSIS — M6281 Muscle weakness (generalized): Secondary | ICD-10-CM | POA: Diagnosis not present

## 2016-10-08 DIAGNOSIS — R279 Unspecified lack of coordination: Secondary | ICD-10-CM | POA: Diagnosis not present

## 2016-10-08 LAB — HEMOGLOBIN A1C
Hgb A1c MFr Bld: 6.9 % — ABNORMAL HIGH (ref 4.8–5.6)
MEAN PLASMA GLUCOSE: 151 mg/dL

## 2016-10-11 DIAGNOSIS — R279 Unspecified lack of coordination: Secondary | ICD-10-CM | POA: Diagnosis not present

## 2016-10-11 DIAGNOSIS — I639 Cerebral infarction, unspecified: Secondary | ICD-10-CM | POA: Diagnosis not present

## 2016-10-11 DIAGNOSIS — M6281 Muscle weakness (generalized): Secondary | ICD-10-CM | POA: Diagnosis not present

## 2016-10-12 DIAGNOSIS — R279 Unspecified lack of coordination: Secondary | ICD-10-CM | POA: Diagnosis not present

## 2016-10-12 DIAGNOSIS — M6281 Muscle weakness (generalized): Secondary | ICD-10-CM | POA: Diagnosis not present

## 2016-10-12 DIAGNOSIS — I639 Cerebral infarction, unspecified: Secondary | ICD-10-CM | POA: Diagnosis not present

## 2016-10-13 DIAGNOSIS — I639 Cerebral infarction, unspecified: Secondary | ICD-10-CM | POA: Diagnosis not present

## 2016-10-13 DIAGNOSIS — M6281 Muscle weakness (generalized): Secondary | ICD-10-CM | POA: Diagnosis not present

## 2016-10-13 DIAGNOSIS — R279 Unspecified lack of coordination: Secondary | ICD-10-CM | POA: Diagnosis not present

## 2016-10-14 DIAGNOSIS — I639 Cerebral infarction, unspecified: Secondary | ICD-10-CM | POA: Diagnosis not present

## 2016-10-14 DIAGNOSIS — R279 Unspecified lack of coordination: Secondary | ICD-10-CM | POA: Diagnosis not present

## 2016-10-14 DIAGNOSIS — M6281 Muscle weakness (generalized): Secondary | ICD-10-CM | POA: Diagnosis not present

## 2016-10-15 DIAGNOSIS — R279 Unspecified lack of coordination: Secondary | ICD-10-CM | POA: Diagnosis not present

## 2016-10-15 DIAGNOSIS — I639 Cerebral infarction, unspecified: Secondary | ICD-10-CM | POA: Diagnosis not present

## 2016-10-15 DIAGNOSIS — M6281 Muscle weakness (generalized): Secondary | ICD-10-CM | POA: Diagnosis not present

## 2016-10-17 DIAGNOSIS — M6281 Muscle weakness (generalized): Secondary | ICD-10-CM | POA: Diagnosis not present

## 2016-10-17 DIAGNOSIS — R279 Unspecified lack of coordination: Secondary | ICD-10-CM | POA: Diagnosis not present

## 2016-10-17 DIAGNOSIS — I639 Cerebral infarction, unspecified: Secondary | ICD-10-CM | POA: Diagnosis not present

## 2016-10-18 DIAGNOSIS — M6281 Muscle weakness (generalized): Secondary | ICD-10-CM | POA: Diagnosis not present

## 2016-10-18 DIAGNOSIS — I639 Cerebral infarction, unspecified: Secondary | ICD-10-CM | POA: Diagnosis not present

## 2016-10-18 DIAGNOSIS — R279 Unspecified lack of coordination: Secondary | ICD-10-CM | POA: Diagnosis not present

## 2016-10-19 DIAGNOSIS — R279 Unspecified lack of coordination: Secondary | ICD-10-CM | POA: Diagnosis not present

## 2016-10-19 DIAGNOSIS — I639 Cerebral infarction, unspecified: Secondary | ICD-10-CM | POA: Diagnosis not present

## 2016-10-19 DIAGNOSIS — M6281 Muscle weakness (generalized): Secondary | ICD-10-CM | POA: Diagnosis not present

## 2016-10-20 DIAGNOSIS — R279 Unspecified lack of coordination: Secondary | ICD-10-CM | POA: Diagnosis not present

## 2016-10-20 DIAGNOSIS — M6281 Muscle weakness (generalized): Secondary | ICD-10-CM | POA: Diagnosis not present

## 2016-10-20 DIAGNOSIS — I639 Cerebral infarction, unspecified: Secondary | ICD-10-CM | POA: Diagnosis not present

## 2016-10-21 DIAGNOSIS — M6281 Muscle weakness (generalized): Secondary | ICD-10-CM | POA: Diagnosis not present

## 2016-10-21 DIAGNOSIS — R279 Unspecified lack of coordination: Secondary | ICD-10-CM | POA: Diagnosis not present

## 2016-10-21 DIAGNOSIS — I639 Cerebral infarction, unspecified: Secondary | ICD-10-CM | POA: Diagnosis not present

## 2016-10-22 ENCOUNTER — Encounter (HOSPITAL_COMMUNITY)
Admission: RE | Admit: 2016-10-22 | Discharge: 2016-10-22 | Disposition: A | Discharge status: Home / Self Care | Payer: Medicare Other | Source: Skilled Nursing Facility | Attending: *Deleted | Admitting: *Deleted

## 2016-10-22 ENCOUNTER — Non-Acute Institutional Stay (SKILLED_NURSING_FACILITY): Payer: Medicare Other | Admitting: Internal Medicine

## 2016-10-22 ENCOUNTER — Encounter: Payer: Self-pay | Admitting: Internal Medicine

## 2016-10-22 DIAGNOSIS — N4289 Other specified disorders of prostate: Secondary | ICD-10-CM | POA: Diagnosis not present

## 2016-10-22 DIAGNOSIS — R300 Dysuria: Secondary | ICD-10-CM | POA: Diagnosis not present

## 2016-10-22 DIAGNOSIS — K559 Vascular disorder of intestine, unspecified: Secondary | ICD-10-CM | POA: Diagnosis not present

## 2016-10-22 DIAGNOSIS — R001 Bradycardia, unspecified: Secondary | ICD-10-CM | POA: Diagnosis not present

## 2016-10-22 DIAGNOSIS — E119 Type 2 diabetes mellitus without complications: Secondary | ICD-10-CM | POA: Diagnosis not present

## 2016-10-22 DIAGNOSIS — I10 Essential (primary) hypertension: Secondary | ICD-10-CM | POA: Diagnosis not present

## 2016-10-22 DIAGNOSIS — D500 Iron deficiency anemia secondary to blood loss (chronic): Secondary | ICD-10-CM | POA: Diagnosis not present

## 2016-10-22 DIAGNOSIS — M6281 Muscle weakness (generalized): Secondary | ICD-10-CM | POA: Diagnosis not present

## 2016-10-22 LAB — URINALYSIS, ROUTINE W REFLEX MICROSCOPIC
GLUCOSE, UA: NEGATIVE mg/dL
Hgb urine dipstick: NEGATIVE
Ketones, ur: NEGATIVE mg/dL
NITRITE: NEGATIVE
PH: 8 (ref 5.0–8.0)
Protein, ur: 30 mg/dL — AB
SPECIFIC GRAVITY, URINE: 1.024 (ref 1.005–1.030)

## 2016-10-22 NOTE — Progress Notes (Signed)
Location:   [facility] Room Number: 104/W Place of Service:  SNF [phone]) Provider:  Freddi Starr, MD  Patient Care Team: [name], MD as PCP - General (Internal Medicine) Danie Binder, MD as Consulting Physician (Gastroenterology)  Extended Emergency Contact Information Primary Emergency Contact: Dorathy Kinsman [place] of [place] Phone: [phone] Relation: Daughter Secondary Emergency Contact: [place] of [place] Phone: [phone] Relation: Daughter  Code Status:  DNR Goals of care: Advanced Directive information Advanced Directives 10/22/2016  Does Patient Have a Medical Advance Directive? Yes  Type of Advance Directive Out of facility DNR (pink MOST or yellow form)  Does patient want to make changes to medical advance directive? No - Patient declined  Copy of [place] in Chart? -  Would patient like information on creating a medical advance directive? -     Chief Complaint  Patient presents with  . Acute Visit    Burning while urinating    HPI:  Pt is a 82 y.o. male seen today for an acute visit for Complaints of burning with urination-at one point he had a history of UTIs but this has been now stable for a number of months.  Last infection was Proteus back in September that I can see.  He does have history of BPH and continues on Flomax.    He is currently resting comfortably in his wheelchair vital signs appear to be stable does not complain of any fever chills or back pain  He does have a history ofcoronary artery disease status post CABG-diabetes type 2-history CVA-hypertension-constipation-hyperlipidemia and poor vision--but clinically has been doing quite well here for an extended period time.  At one point had failure to thrive issues but this has resolved   Past Medical History:  Diagnosis Date  . Anemia due to chronic blood loss   . CAD  (coronary artery disease)    cath 11/2007: 70% LM ISR, SVG-LAD OK, SVG-D1 100%, IMA-D2 OK, SVG-OM OK, 4-vessel CABG 2000.  Marland Kitchen CAD (coronary artery disease)    s/p of 95% L main artery stenosis, 4/03: cutting balloon PTCA of 80% in-stent restenosis, 7/04. preserved L ventriculr function  . Carotid bruit    bilateral. no sigificant distal abd atherosclerosis by recent cath.   . Chronic bronchitis ([place])   . Constipation   . COPD (chronic obstructive pulmonary disease) (HCC)    ongoing tobacco   . CVA (cerebral infarction)   . DM2 (diabetes mellitus, type 2) ([place])   . Dyslipidemia   . Dysphagia   . Glaucoma   . HTN (hypertension)    Past Surgical History:  Procedure Laterality Date  . CARDIAC CATHETERIZATION    . COLONOSCOPY N/A 05/14/2015   POOR PREP  . COLONOSCOPY N/A 06/02/2015   SLF: 1. four colorectal polyps removed. no source for anemia identified. 2. the left colon is redundant 3. moderate sized internal hemorroids.   . ESOPHAGOGASTRODUODENOSCOPY N/A 05/14/2015   CHRONIC GASTRITIS  . GIVENS CAPSULE STUDY N/A 06/17/2015   Procedure: GIVENS CAPSULE STUDY;  Surgeon: Danie Binder, MD;  Location[facility];  Service: Endoscopy;  Laterality: N/A;  0800    No Known Allergies  Outpatient Encounter Prescriptions as of 10/22/2016  Medication Sig  . amLODipine (NORVASC) 2.5 MG tablet Take 5 mg by mouth daily.   . Calcium Carbonate-Vitamin D (OSCAL 500/200 D-3 PO) 1 Tablet once a day by mouth . Can't be given  with iron  . clopidogrel (PLAVIX) 75 MG tablet Take 75 mg by mouth daily.  Marland Kitchen docusate sodium (COLACE) 100 MG capsule Take 100 mg by mouth 2 (two) times daily.  . ferrous sulfate 325 (65 FE) MG tablet Take 1 tablet (325 mg total) by mouth 3 (three) times daily with meals.  Marland Kitchen labetalol (NORMODYNE) 200 MG tablet Take 200 mg by mouth 3 (three) times daily. Reported on 10/22/2015  . linaclotide (LINZESS) 72 MCG capsule Take 72 mcg by mouth daily before breakfast.  . lisinopril  (PRINIVIL,ZESTRIL) 40 MG tablet Take 40 mg by mouth daily.  . metFORMIN (GLUCOPHAGE) 500 MG tablet Take 500 mg by mouth 2 (two) times daily with a meal.   . Multiple Vitamin (MULTIVITAMIN) tablet Take 1 tablet by mouth daily.  . Omega-3 Fatty Acids (FISH OIL) 1000 MG CAPS Take 1,000 mg by mouth 3 (three) times daily.  . pantoprazole (PROTONIX) 40 MG tablet Take 40 mg by mouth daily.  . pravastatin (PRAVACHOL) 80 MG tablet Take 80 mg by mouth daily.  Marland Kitchen senna (SENOKOT) 8.6 MG TABS tablet Take 2 tablets (17.2 mg total) by mouth daily.  . tamsulosin (FLOMAX) 0.4 MG CAPS capsule Take 0.4 mg by mouth daily after supper.   No facility-administered encounter medications on file as of 10/22/2016.     Review of Systems  In general does not complain of any fever or chills   Skin does not complain of rashes or itching.  Head ears eyes nose mouth and throat does not complaining sore throat-he is legally blindsays he sees shadows--this appears to be baseline  Resp--does not complain of shortness breath or cough.  Cardiac no chest pain.Or significant lower extremity edema  GI is not complaining of any nausea vomiting diarrhea or constipation   GU Burning with urination and some mild suprapubic tenderness--says burning has progressed last few days    Muscle skeletal is not complaining of joint pain.  Neurologic does not complain of dizziness or headache or syncope.  Psych nursing staff does not report any behavior issues continues to be pleasant cooperative talkative and in good spirits  Immunization History  Administered Date(s) Administered  . Influenza,inj,Quad PF,36+ Mos 03/07/2015  . Influenza-Unspecified 02/03/2016  . PPD Test 03/10/2015, 03/24/2015  . Pneumococcal-Unspecified 02/10/2016   Pertinent  Health Maintenance Due  Topic Date Due  . FOOT EXAM  08/31/1944  . INFLUENZA VACCINE  12/01/2016  . OPHTHALMOLOGY EXAM  02/09/2017  . PNA vac Low Risk Adult (2 of 2 -  PCV13) 02/09/2017  . HEMOGLOBIN A1C  04/08/2017  . URINE MICROALBUMIN  06/25/2017   No flowsheet data found. Functional Status Survey:    Vitals:   10/22/16 1200  BP: (!) 122/56  Pulse: (!) 57  Resp: 20  Temp: 97.9 F (36.6 C)  TempSrc: Oral    Physical Exam   Gen. this is a very pleasant elderly male in no distress sitting comfortably in his wheelchair continues to be in good spirits .  His skin is warm and dry.  Eyes does have a history of legally blindness with some vision apparently in his right eye left eye has an opacity says he sees shadows  Oropharynx clear mucous membranes moist.  Chest is clear to auscultation there is no labored breathing.  Heart is regular rate and rhythm slightly bradycardic in the high 50s with a 2/6 systolic murmur as very minimal lower extremity edema.  His abdomen soft nontender somewhat obese with positive bowel sounds.  GU-could  not really appreciate any suprapubic distention he does have some mild suprapubic tenderness to palpation no  CV tenderness  Muscle skeletal moves all extremities at baseline with a history of minimal left-sided weakness--largely ambulates in a wheelchair.  Neurologic he continues with left facial weakness and speech is relatively clear however has some left-sided weakness as noted above but he is doing where he well with supportive care here.  Psych he is grossly alert and oriented pleasant and appropriate in good spirits jovial person  Labs reviewed:  Labs reviewed:  Recent Labs  07/26/16 0700 08/16/16 1430 08/31/16 0700  NA 140 138 138  K 3.8 3.5 3.9  CL 107 106 105  CO2 25 22 26   GLUCOSE 105* 145* 120*  BUN 12 13 17   CREATININE 0.83 0.89 0.87  CALCIUM 9.0 8.5* 9.2    Recent Labs  10/31/15 1502 06/25/16 0700 07/26/16 0700 08/16/16 1430  AST 17  --  17 21  ALT 18  --  13* 18  ALKPHOS 82  --  53 54  BILITOT 0.4  --  0.5 0.2*  PROT 7.9  --  6.9 6.8  ALBUMIN 4.0 3.9 3.4* 3.4*      Recent Labs  07/26/16 0700 08/16/16 1430 08/31/16 0700  WBC 7.8 10.2 8.6  NEUTROABS 4.0 7.1 5.3  HGB 12.0* 11.4* 12.0*  HCT 35.4* 33.3* 36.9*  MCV 85.9 84.9 86.6  PLT 309 276 357   Lab Results  Component Value Date   TSH 1.064 10/31/2015   Lab Results  Component Value Date   HGBA1C 6.9 (H) 10/07/2016   Lab Results  Component Value Date   CHOL 124 03/15/2016   HDL 33 (L) 03/15/2016   LDLCALC 76 03/15/2016   TRIG 75 03/15/2016   CHOLHDL 3.8 03/15/2016    Significant Diagnostic Results in last 30 days:  No results found.  Assessment/Plan  #1 dysuria-he does have some history of UTIs although this is been stable now for a number of months-will obtain a urinalysis and culture-also will obtain an updated CBC with differential at times he will develop somewhat elevated white count  At times when he has a UTI.  Also will update a metabolic panel keep an eye on his renal function.  #2 history of BPH he is on Flomax again will obtain urine culture-continues to urinate well.per nursing  #3 history of bradycardia this is asymptomatic he is on labetalol but has tolerated this well this point will monitor blood pressures appear to be stabilized at one point elevated but lately has moderated blood pressure today 122/56--he is on labetalol 200 mg 3 times a day-lisinopril 40 mg a day as well as Norvasc 5-- there are orders to hold the labetalol for pulse 50 or less   [place], [place], [place]

## 2016-10-23 ENCOUNTER — Encounter (HOSPITAL_COMMUNITY)
Admission: AD | Admit: 2016-10-23 | Discharge: 2016-10-23 | Disposition: A | Discharge status: Home / Self Care | Payer: Medicare Other | Source: Skilled Nursing Facility | Attending: *Deleted | Admitting: *Deleted

## 2016-10-23 DIAGNOSIS — K559 Vascular disorder of intestine, unspecified: Secondary | ICD-10-CM | POA: Diagnosis not present

## 2016-10-23 DIAGNOSIS — E119 Type 2 diabetes mellitus without complications: Secondary | ICD-10-CM | POA: Diagnosis not present

## 2016-10-23 DIAGNOSIS — D500 Iron deficiency anemia secondary to blood loss (chronic): Secondary | ICD-10-CM | POA: Diagnosis not present

## 2016-10-23 DIAGNOSIS — N4289 Other specified disorders of prostate: Secondary | ICD-10-CM | POA: Diagnosis not present

## 2016-10-23 DIAGNOSIS — M6281 Muscle weakness (generalized): Secondary | ICD-10-CM | POA: Diagnosis not present

## 2016-10-23 LAB — BASIC METABOLIC PANEL
ANION GAP: 11 (ref 5–15)
BUN: 18 mg/dL (ref 6–20)
CALCIUM: 9.6 mg/dL (ref 8.9–10.3)
CO2: 22 mmol/L (ref 22–32)
CREATININE: 0.99 mg/dL (ref 0.61–1.24)
Chloride: 109 mmol/L (ref 101–111)
Glucose, Bld: 106 mg/dL — ABNORMAL HIGH (ref 65–99)
Potassium: 4.5 mmol/L (ref 3.5–5.1)
Sodium: 142 mmol/L (ref 135–145)

## 2016-10-23 LAB — CBC WITH DIFFERENTIAL/PLATELET
BASOS ABS: 0 10*3/uL (ref 0.0–0.1)
BASOS PCT: 1 %
EOS ABS: 0.4 10*3/uL (ref 0.0–0.7)
EOS PCT: 6 %
HCT: 36 % — ABNORMAL LOW (ref 39.0–52.0)
Hemoglobin: 12 g/dL — ABNORMAL LOW (ref 13.0–17.0)
Lymphocytes Relative: 33 %
Lymphs Abs: 2.3 10*3/uL (ref 0.7–4.0)
MCH: 28.3 pg (ref 26.0–34.0)
MCHC: 33.3 g/dL (ref 30.0–36.0)
MCV: 84.9 fL (ref 78.0–100.0)
MONO ABS: 0.4 10*3/uL (ref 0.1–1.0)
MONOS PCT: 6 %
Neutro Abs: 4 10*3/uL (ref 1.7–7.7)
Neutrophils Relative %: 54 %
PLATELETS: 260 10*3/uL (ref 150–400)
RBC: 4.24 MIL/uL (ref 4.22–5.81)
RDW: 13.7 % (ref 11.5–15.5)
WBC: 7.1 10*3/uL (ref 4.0–10.5)

## 2016-10-25 ENCOUNTER — Non-Acute Institutional Stay (SKILLED_NURSING_FACILITY): Payer: Medicare Other | Admitting: Internal Medicine

## 2016-10-25 ENCOUNTER — Encounter: Payer: Self-pay | Admitting: Internal Medicine

## 2016-10-25 DIAGNOSIS — D500 Iron deficiency anemia secondary to blood loss (chronic): Secondary | ICD-10-CM

## 2016-10-25 DIAGNOSIS — R279 Unspecified lack of coordination: Secondary | ICD-10-CM | POA: Diagnosis not present

## 2016-10-25 DIAGNOSIS — E113593 Type 2 diabetes mellitus with proliferative diabetic retinopathy without macular edema, bilateral: Secondary | ICD-10-CM

## 2016-10-25 DIAGNOSIS — N390 Urinary tract infection, site not specified: Secondary | ICD-10-CM

## 2016-10-25 DIAGNOSIS — I10 Essential (primary) hypertension: Secondary | ICD-10-CM

## 2016-10-25 DIAGNOSIS — M6281 Muscle weakness (generalized): Secondary | ICD-10-CM | POA: Diagnosis not present

## 2016-10-25 DIAGNOSIS — I639 Cerebral infarction, unspecified: Secondary | ICD-10-CM | POA: Diagnosis not present

## 2016-10-25 LAB — URINE CULTURE

## 2016-10-25 NOTE — Progress Notes (Signed)
Location:   [facility] Room Number: 104/W Place of Service:  SNF [phone]) Provider:  Freddi Starr, MD  Patient Care Team: Darin Dad, MD as PCP - General (Internal Medicine) Darin Binder, MD as Consulting Physician (Gastroenterology)  Extended Emergency Contact Information Primary Emergency Contact: [name] States of [place] Phone: [phone] Relation: Daughter Secondary Emergency Contact: [name] Phone: [phone] Relation: Daughter  Code Status:  DNR Goals of care: Advanced Directive information Advanced Directives 10/25/2016  Does Patient Have a Medical Advance Directive? Yes  Type of Advance Directive Out of facility DNR (pink MOST or yellow form)  Does patient want to make changes to medical advance directive? No - Patient declined  Copy of [place] in Chart? -  Would patient like information on creating a medical advance directive? -     Chief Complaint  Patient presents with  . Acute Visit    UTI    HPI:  Pt is a 82 y.o. male seen today for an acute visit for Follow-up of dysuria-updated culture does show greater than 100,000 colonies of Proteus-he continues to complain of some dysuria but does not complain of any fever chills or back pain.  I do not back in September he also had a UTI with Proteus appears initially been started on Cipro but when sensitivities arrive this was switched-it appears at that point it was sensitive to ampicillin other antibiotics.  Currently he is afebrile does not really have any complaints other than continued burning with urination.   He does have a history ofcoronary artery disease status post CABG-diabetes type 2-history CVA-hypertension-constipation-hyperlipidemia and poor vision--clinically he remained stable.  He also had recent lab work which shows stability his hemoglobin is 12 which is stable he does  have a history of GI bleed with AVM malformations-he is on iron.  His renal function also appears stable on lab done 2 days ago creatinine of 0.99 BUN of 18 electrolytes were unremarkable.      Past Medical History:  Diagnosis Date  . Anemia due to chronic blood loss   . CAD (coronary artery disease)    cath 11/2007: 70% LM ISR, SVG-LAD OK, SVG-D1 100%, IMA-D2 OK, SVG-OM OK, 4-vessel CABG 2000.  Marland Kitchen CAD (coronary artery disease)    s/p of 95% L main artery stenosis, 4/03: cutting balloon PTCA of 80% in-stent restenosis, 7/04. preserved L ventriculr function  . Carotid bruit    bilateral. no sigificant distal abd atherosclerosis by recent cath.   . Chronic bronchitis ([place])   . Constipation   . COPD (chronic obstructive pulmonary disease) (HCC)    ongoing tobacco   . CVA (cerebral infarction)   . DM2 (diabetes mellitus, type 2) ([place])   . Dyslipidemia   . Dysphagia   . Glaucoma   . HTN (hypertension)    Past Surgical History:  Procedure Laterality Date  . CARDIAC CATHETERIZATION    . COLONOSCOPY N/A 05/14/2015   POOR PREP  . COLONOSCOPY N/A 06/02/2015   SLF: 1. four colorectal polyps removed. no source for anemia identified. 2. the left colon is redundant 3. moderate sized internal hemorroids.   . ESOPHAGOGASTRODUODENOSCOPY N/A 05/14/2015   CHRONIC GASTRITIS  . GIVENS CAPSULE STUDY N/A 06/17/2015   Procedure: GIVENS CAPSULE STUDY;  Surgeon: Darin Binder, MD;  [facility];  Service: Endoscopy;  Laterality: N/A;  0800    No Known Allergies  Outpatient Encounter Prescriptions as of 10/25/2016  Medication Sig  . amLODipine (NORVASC) 2.5 MG tablet Take 5 mg by mouth daily.   . Calcium Carbonate-Vitamin D (OSCAL 500/200 D-3 PO) 1 Tablet once a day by mouth . Can't be given with iron  . clopidogrel (PLAVIX) 75 MG tablet Take 75 mg by mouth daily.  Marland Kitchen docusate sodium (COLACE) 100 MG capsule Take 100 mg by mouth 2 (two) times daily.  . ferrous sulfate 325 (65 FE) MG tablet  Take 1 tablet (325 mg total) by mouth 3 (three) times daily with meals.  Marland Kitchen labetalol (NORMODYNE) 200 MG tablet Take 200 mg by mouth 3 (three) times daily. Reported on 10/22/2015  . linaclotide (LINZESS) 72 MCG capsule Take 72 mcg by mouth daily before breakfast.  . lisinopril (PRINIVIL,ZESTRIL) 40 MG tablet Take 40 mg by mouth daily.  . metFORMIN (GLUCOPHAGE) 500 MG tablet Take 500 mg by mouth 2 (two) times daily with a meal.   . Multiple Vitamin (MULTIVITAMIN) tablet Take 1 tablet by mouth daily.  . Omega-3 Fatty Acids (FISH OIL) 1000 MG CAPS Take 1,000 mg by mouth 3 (three) times daily.  . pantoprazole (PROTONIX) 40 MG tablet Take 40 mg by mouth daily.  . pravastatin (PRAVACHOL) 80 MG tablet Take 80 mg by mouth daily.  Marland Kitchen senna (SENOKOT) 8.6 MG TABS tablet Take 2 tablets (17.2 mg total) by mouth daily.  . tamsulosin (FLOMAX) 0.4 MG CAPS capsule Take 0.4 mg by mouth daily after supper.   No facility-administered encounter medications on file as of 10/25/2016.     Review of Systems In general does not complain of any fever or chills   Skin does not complain of rashes or itching.  Head ears eyes nose mouth and throat does not complaining sore throat-he is legally blindsays he sees shadows--this appears to be baseline  Resp--does not complain of shortness breath or cough.  Cardiac no chest pain.Or significant lower extremity edema  GI is not complaining of any nausea vomiting diarrhea or constipation   GU Continues complaining of burning with urination is not really complaining suprapubic tenderness today    Muscle skeletal is not complaining of joint pain.  Neurologic does not complain of dizziness or headache or syncope.  Psych nursing staff does not report any behavior issues continues to be pleasant cooperativetalkative and in good spirits Immunization History  Administered Date(s) Administered  . Influenza,inj,Quad PF,36+ Mos 03/07/2015  . Influenza-Unspecified  02/03/2016  . PPD Test 03/10/2015, 03/24/2015  . Pneumococcal-Unspecified 02/10/2016   Pertinent  Health Maintenance Due  Topic Date Due  . FOOT EXAM  08/31/1944  . INFLUENZA VACCINE  12/01/2016  . OPHTHALMOLOGY EXAM  02/09/2017  . PNA vac Low Risk Adult (2 of 2 - PCV13) 02/09/2017  . HEMOGLOBIN A1C  04/08/2017  . URINE MICROALBUMIN  06/25/2017   No flowsheet data found. Functional Status Survey:    Vitals:   10/25/16 1120  BP: (!) 131/57  Pulse: (!) 58  Resp: 16  Temp: 98.2 F (36.8 C)  TempSrc: Oral  SpO2: 97%    Physical Exam Gen. this is a very pleasant elderly male in no distress sitting comfortably in his wheelchair continues to be in good spirits .  His skin is warm and dry.  Eyes does have a history of legally blindness with some vision apparently in his right eye left eye has an opacity says he sees shadows  Oropharynx clear mucous membranes moist.  Chest is clear to auscultation there is  no labored breathing.  Heart is regular rate and rhythm with a pulse of 60 on auscultation with a 2/6 systolic murmur as very minimal lower extremity edema.  His abdomen soft nontender somewhat obese with positive bowel sounds.  GU-could not really appreciate any suprapubic distention or tenderness today however he is complaining of continued burning  Muscle skeletal moves all extremities at baseline with a history of minimal left-sided weakness--largely ambulates in a wheelchair.  Neurologic he continues with left facial weakness and speech is relatively clear however has some left-sided weakness as noted above but he is doing where he well with supportive care here.  Psych he is grossly alert and oriented pleasant and appropriate in good spirits jovial person-which has been his baseline for some time  Labs reviewed:  Recent Labs  08/16/16 1430 08/31/16 0700 10/23/16 0420  NA 138 138 142  K 3.5 3.9 4.5  CL 106 105 109  CO2 22 26 22   GLUCOSE 145* 120*  106*  BUN 13 17 18   CREATININE 0.89 0.87 0.99  CALCIUM 8.5* 9.2 9.6    Recent Labs  10/31/15 1502 06/25/16 0700 07/26/16 0700 08/16/16 1430  AST 17  --  17 21  ALT 18  --  13* 18  ALKPHOS 82  --  53 54  BILITOT 0.4  --  0.5 0.2*  PROT 7.9  --  6.9 6.8  ALBUMIN 4.0 3.9 3.4* 3.4*    Recent Labs  08/16/16 1430 08/31/16 0700 10/23/16 0420  WBC 10.2 8.6 7.1  NEUTROABS 7.1 5.3 4.0  HGB 11.4* 12.0* 12.0*  HCT 33.3* 36.9* 36.0*  MCV 84.9 86.6 84.9  PLT 276 357 260   Lab Results  Component Value Date   TSH 1.064 10/31/2015   Lab Results  Component Value Date   HGBA1C 6.9 (H) 10/07/2016   Lab Results  Component Value Date   CHOL 124 03/15/2016   HDL 33 (L) 03/15/2016   LDLCALC 76 03/15/2016   TRIG 75 03/15/2016   CHOLHDL 3.8 03/15/2016    Significant Diagnostic Results in last 30 days:  No results found.  Assessment/Plan  #1 UTI-final sensitivities are pending however per chart review it appears his previous UTI was Proteus as well-at that point it was sensitive to ampicillin among other antibiotics-will start him. Amoxicillin and await for sensitivities-he certainly does not give a septic presentation and in fact his white count is within normal range.  #2 anemia with involved history as noted above with AV malformations this is been quite stable he is on iron hemoglobin of 12 on lab done on June 23.  #3 hypertension at one point this is been a significant issue with a systolic over 409 his Norvasc was increased he is also on labetalol as well as lisinopril --systolic of 735 today appears to be stable will continue to monitor  #4 history diabetes type 2-this appears stable on Glucophage recent blood sugars have been largely in the 90s to mid 100s occasional spikes above 200 but this appears to be quite rare--it does not appear UTI is really affected his blood sugars   [id number]

## 2016-10-26 DIAGNOSIS — R279 Unspecified lack of coordination: Secondary | ICD-10-CM | POA: Diagnosis not present

## 2016-10-26 DIAGNOSIS — M6281 Muscle weakness (generalized): Secondary | ICD-10-CM | POA: Diagnosis not present

## 2016-10-26 DIAGNOSIS — I639 Cerebral infarction, unspecified: Secondary | ICD-10-CM | POA: Diagnosis not present

## 2016-10-27 DIAGNOSIS — M6281 Muscle weakness (generalized): Secondary | ICD-10-CM | POA: Diagnosis not present

## 2016-10-27 DIAGNOSIS — I639 Cerebral infarction, unspecified: Secondary | ICD-10-CM | POA: Diagnosis not present

## 2016-10-27 DIAGNOSIS — R279 Unspecified lack of coordination: Secondary | ICD-10-CM | POA: Diagnosis not present

## 2016-10-28 DIAGNOSIS — M6281 Muscle weakness (generalized): Secondary | ICD-10-CM | POA: Diagnosis not present

## 2016-10-28 DIAGNOSIS — I639 Cerebral infarction, unspecified: Secondary | ICD-10-CM | POA: Diagnosis not present

## 2016-10-28 DIAGNOSIS — R279 Unspecified lack of coordination: Secondary | ICD-10-CM | POA: Diagnosis not present

## 2016-10-29 DIAGNOSIS — M6281 Muscle weakness (generalized): Secondary | ICD-10-CM | POA: Diagnosis not present

## 2016-10-29 DIAGNOSIS — R279 Unspecified lack of coordination: Secondary | ICD-10-CM | POA: Diagnosis not present

## 2016-10-29 DIAGNOSIS — I639 Cerebral infarction, unspecified: Secondary | ICD-10-CM | POA: Diagnosis not present

## 2016-11-01 DIAGNOSIS — R279 Unspecified lack of coordination: Secondary | ICD-10-CM | POA: Diagnosis not present

## 2016-11-01 DIAGNOSIS — M6281 Muscle weakness (generalized): Secondary | ICD-10-CM | POA: Diagnosis not present

## 2016-11-01 DIAGNOSIS — I639 Cerebral infarction, unspecified: Secondary | ICD-10-CM | POA: Diagnosis not present

## 2016-11-02 DIAGNOSIS — I639 Cerebral infarction, unspecified: Secondary | ICD-10-CM | POA: Diagnosis not present

## 2016-11-02 DIAGNOSIS — R279 Unspecified lack of coordination: Secondary | ICD-10-CM | POA: Diagnosis not present

## 2016-11-02 DIAGNOSIS — M6281 Muscle weakness (generalized): Secondary | ICD-10-CM | POA: Diagnosis not present

## 2016-11-03 DIAGNOSIS — M6281 Muscle weakness (generalized): Secondary | ICD-10-CM | POA: Diagnosis not present

## 2016-11-03 DIAGNOSIS — I639 Cerebral infarction, unspecified: Secondary | ICD-10-CM | POA: Diagnosis not present

## 2016-11-03 DIAGNOSIS — R279 Unspecified lack of coordination: Secondary | ICD-10-CM | POA: Diagnosis not present

## 2016-11-04 DIAGNOSIS — M6281 Muscle weakness (generalized): Secondary | ICD-10-CM | POA: Diagnosis not present

## 2016-11-04 DIAGNOSIS — I639 Cerebral infarction, unspecified: Secondary | ICD-10-CM | POA: Diagnosis not present

## 2016-11-04 DIAGNOSIS — R279 Unspecified lack of coordination: Secondary | ICD-10-CM | POA: Diagnosis not present

## 2016-11-05 DIAGNOSIS — M6281 Muscle weakness (generalized): Secondary | ICD-10-CM | POA: Diagnosis not present

## 2016-11-05 DIAGNOSIS — I639 Cerebral infarction, unspecified: Secondary | ICD-10-CM | POA: Diagnosis not present

## 2016-11-05 DIAGNOSIS — R279 Unspecified lack of coordination: Secondary | ICD-10-CM | POA: Diagnosis not present

## 2016-11-08 DIAGNOSIS — I639 Cerebral infarction, unspecified: Secondary | ICD-10-CM | POA: Diagnosis not present

## 2016-11-08 DIAGNOSIS — R279 Unspecified lack of coordination: Secondary | ICD-10-CM | POA: Diagnosis not present

## 2016-11-08 DIAGNOSIS — M6281 Muscle weakness (generalized): Secondary | ICD-10-CM | POA: Diagnosis not present

## 2016-11-09 ENCOUNTER — Encounter: Payer: Self-pay | Admitting: Internal Medicine

## 2016-11-09 ENCOUNTER — Non-Acute Institutional Stay (SKILLED_NURSING_FACILITY): Payer: Medicare Other | Admitting: Internal Medicine

## 2016-11-09 DIAGNOSIS — Z8673 Personal history of transient ischemic attack (TIA), and cerebral infarction without residual deficits: Secondary | ICD-10-CM

## 2016-11-09 DIAGNOSIS — E785 Hyperlipidemia, unspecified: Secondary | ICD-10-CM | POA: Diagnosis not present

## 2016-11-09 DIAGNOSIS — E1151 Type 2 diabetes mellitus with diabetic peripheral angiopathy without gangrene: Secondary | ICD-10-CM | POA: Diagnosis not present

## 2016-11-09 DIAGNOSIS — I10 Essential (primary) hypertension: Secondary | ICD-10-CM

## 2016-11-09 DIAGNOSIS — R279 Unspecified lack of coordination: Secondary | ICD-10-CM | POA: Diagnosis not present

## 2016-11-09 DIAGNOSIS — M6281 Muscle weakness (generalized): Secondary | ICD-10-CM | POA: Diagnosis not present

## 2016-11-09 DIAGNOSIS — D500 Iron deficiency anemia secondary to blood loss (chronic): Secondary | ICD-10-CM | POA: Diagnosis not present

## 2016-11-09 DIAGNOSIS — I639 Cerebral infarction, unspecified: Secondary | ICD-10-CM | POA: Diagnosis not present

## 2016-11-09 NOTE — Progress Notes (Signed)
Location:   [facility] Room Number: 104/W Place of Service:  SNF [phone]) Provider:  Kyung Rudd, Rene Kocher, MD  Patient Care Team: [name], MD as PCP - General (Internal Medicine) Danie Binder, MD as Consulting Physician (Gastroenterology)  Extended Emergency Contact Information Primary Emergency Contact: Dorathy Kinsman States of [place] Phone: [phone] Relation: Daughter Secondary Emergency Contact: [place] Phone: [phone] Relation: Daughter  Code Status:  DNR Goals of care: Advanced Directive information Advanced Directives 11/09/2016  Does Patient Have a Medical Advance Directive? Yes  Type of Advance Directive Out of facility DNR (pink MOST or yellow form)  Does patient want to make changes to medical advance directive? No - Patient declined  Copy of [place] in Chart? -  Would patient like information on creating a medical advance directive? -     Chief Complaint  Patient presents with  . Medical Management of Chronic Issues    Routine Visit    HPI:  Pt is a 82 y.o. male seen today for medical management of chronic diseases.    Patient has H/O GI bleed, Diabetes type 2 ,S/P CVA , Hypertension, Hyperlipidemia, constipation. Poor vision . CAD S/P CABG in 2000 and PCI of Left Main on 2003 and Carotid artery stenosis.with complete occlusion of RCA.  Patient has been doing well in facility. He did have UTI which was treated and he is not having any more symptoms of Dysuria. He did c/o Insomnia and per nurses he drinks Diet coke in evening. Also has been on Melatonin before. No other Nursing issues. His weight is stable at 184 lbs He also have seen Cardiology for his RCA stenosis with no new recommendations. He follows with Gertie Fey for Constipation and Anemia.  Past Medical History:  Diagnosis Date  . Anemia due to chronic blood loss   . CAD (coronary  artery disease)    cath 11/2007: 70% LM ISR, SVG-LAD OK, SVG-D1 100%, IMA-D2 OK, SVG-OM OK, 4-vessel CABG 2000.  Marland Kitchen CAD (coronary artery disease)    s/p of 95% L main artery stenosis, 4/03: cutting balloon PTCA of 80% in-stent restenosis, 7/04. preserved L ventriculr function  . Carotid bruit    bilateral. no sigificant distal abd atherosclerosis by recent cath.   . Chronic bronchitis ([place])   . Constipation   . COPD (chronic obstructive pulmonary disease) (HCC)    ongoing tobacco   . CVA (cerebral infarction)   . DM2 (diabetes mellitus, type 2) ([place])   . Dyslipidemia   . Dysphagia   . Glaucoma   . HTN (hypertension)    Past Surgical History:  Procedure Laterality Date  . CARDIAC CATHETERIZATION    . COLONOSCOPY N/A 05/14/2015   POOR PREP  . COLONOSCOPY N/A 06/02/2015   SLF: 1. four colorectal polyps removed. no source for anemia identified. 2. the left colon is redundant 3. moderate sized internal hemorroids.   . ESOPHAGOGASTRODUODENOSCOPY N/A 05/14/2015   CHRONIC GASTRITIS  . GIVENS CAPSULE STUDY N/A 06/17/2015   Procedure: GIVENS CAPSULE STUDY;  Surgeon: Danie Binder, MD;  [facility];  Service: Endoscopy;  Laterality: N/A;  0800    No Known Allergies  Outpatient Encounter Prescriptions as of 11/09/2016  Medication Sig  . amLODipine (NORVASC) 2.5 MG tablet Take 5 mg by mouth daily.   . Calcium Carbonate-Vitamin D (OSCAL 500/200 D-3 PO) 1 Tablet once a day by mouth . Can't be given  with iron  . clopidogrel (PLAVIX) 75 MG tablet Take 75 mg by mouth daily.  Marland Kitchen docusate sodium (COLACE) 100 MG capsule Take 100 mg by mouth 2 (two) times daily.  . ferrous sulfate 325 (65 FE) MG tablet Take 1 tablet (325 mg total) by mouth 3 (three) times daily with meals.  Marland Kitchen labetalol (NORMODYNE) 200 MG tablet Take 200 mg by mouth 3 (three) times daily. Reported on 10/22/2015  . linaclotide (LINZESS) 72 MCG capsule Take 72 mcg by mouth daily before breakfast.  . lisinopril (PRINIVIL,ZESTRIL)  40 MG tablet Take 40 mg by mouth daily.  . metFORMIN (GLUCOPHAGE) 500 MG tablet Take 500 mg by mouth 2 (two) times daily with a meal.   . Multiple Vitamin (MULTIVITAMIN) tablet Take 1 tablet by mouth daily.  . Omega-3 Fatty Acids (FISH OIL) 1000 MG CAPS Take 1,000 mg by mouth 3 (three) times daily.  . pantoprazole (PROTONIX) 40 MG tablet Take 40 mg by mouth daily.  . pravastatin (PRAVACHOL) 80 MG tablet Take 80 mg by mouth daily.  Marland Kitchen senna (SENOKOT) 8.6 MG TABS tablet Take 2 tablets (17.2 mg total) by mouth daily.  . tamsulosin (FLOMAX) 0.4 MG CAPS capsule Take 0.4 mg by mouth daily after supper.   No facility-administered encounter medications on file as of 11/09/2016.      Review of Systems  Review of Systems  Constitutional: Negative for activity change, appetite change, chills, diaphoresis, fatigue and fever.  HENT: Negative for mouth sores, postnasal drip, rhinorrhea, sinus pain and sore throat.   Respiratory: Negative for apnea, cough, chest tightness, shortness of breath and wheezing.   Cardiovascular: Negative for chest pain, palpitations and leg swelling.  Gastrointestinal: Negative for abdominal distention, abdominal pain, constipation, diarrhea, nausea and vomiting.  Genitourinary: Negative for dysuria and frequency.  Musculoskeletal: Negative for arthralgias, joint swelling and myalgias.  Skin: Negative for rash.  Neurological: Negative for dizziness, syncope, weakness, light-headedness and numbness.  Psychiatric/Behavioral: Negative for behavioral problems, confusion   Immunization History  Administered Date(s) Administered  . Influenza,inj,Quad PF,36+ Mos 03/07/2015  . Influenza-Unspecified 02/03/2016  . PPD Test 03/10/2015, 03/24/2015  . Pneumococcal-Unspecified 02/10/2016   Pertinent  Health Maintenance Due  Topic Date Due  . FOOT EXAM  08/31/1944  . INFLUENZA VACCINE  12/01/2016  . OPHTHALMOLOGY EXAM  02/09/2017  . PNA vac Low Risk Adult (2 of 2 - PCV13)  02/09/2017  . HEMOGLOBIN A1C  04/08/2017  . URINE MICROALBUMIN  06/25/2017   No flowsheet data found. Functional Status Survey:    Vitals:   11/09/16 1205  BP: (!) 137/50  Pulse: (!) 56  Temp: (!) 97.4 F (36.3 C)  TempSrc: Oral   There is no height or weight on file to calculate BMI. Physical Exam Constitutional: He appears well-developed and well-nourished.  HENT:  Head: Normocephalic.  Mouth/Throat: Oropharynx is clear and moist.  Neck: Neck supple.  Cardiovascular: Normal rate, regular rhythm and normal heart sounds.   No murmur heard. Pulmonary/Chest: Effort normal and breath sounds normal. No respiratory distress. He has no wheezes. He has no rales.  Abdominal: Soft. Bowel sounds are normal. He exhibits no distension. There is no tenderness.  Musculoskeletal:  Mild edema B/l  Neurological: He is alert.  Skin: Skin is warm and dry.  Psychiatric: He has a normal mood and affect. His behavior is normal. Thought content normal.  Labs reviewed:  Recent Labs  08/16/16 1430 08/31/16 0700 10/23/16 0420  NA 138 138 142  K 3.5 3.9 4.5  CL 106 105 109  CO2 22 26 22   GLUCOSE 145* 120* 106*  BUN 13 17 18   CREATININE 0.89 0.87 0.99  CALCIUM 8.5* 9.2 9.6    Recent Labs  06/25/16 0700 07/26/16 0700 08/16/16 1430  AST  --  17 21  ALT  --  13* 18  ALKPHOS  --  53 54  BILITOT  --  0.5 0.2*  PROT  --  6.9 6.8  ALBUMIN 3.9 3.4* 3.4*    Recent Labs  08/16/16 1430 08/31/16 0700 10/23/16 0420  WBC 10.2 8.6 7.1  NEUTROABS 7.1 5.3 4.0  HGB 11.4* 12.0* 12.0*  HCT 33.3* 36.9* 36.0*  MCV 84.9 86.6 84.9  PLT 276 357 260   Lab Results  Component Value Date   TSH 1.064 10/31/2015   Lab Results  Component Value Date   HGBA1C 6.9 (H) 10/07/2016   Lab Results  Component Value Date   CHOL 124 03/15/2016   HDL 33 (L) 03/15/2016   LDLCALC 76 03/15/2016   TRIG 75 03/15/2016   CHOLHDL 3.8 03/15/2016    Significant Diagnostic Results in last 30 days:  No  results found.  Assessment/Plan  Essential hypertension BP has been stable since his Norvasc was increased to 5 mg. He is also on labetalol,Lisinopril  DM (diabetes mellitus)  BS running stable A1C is 6.8 Continue Glucophage Already on Ace inhibitor for Microalbuminuria Decrease Accu checks to Qam  Constipation,  On Linzess Hyperlipidemia,  LDL 76 in 11/17 Repeat Fasting Lipid  On Prevastatin  Anemia due to chronic blood loss Hgb stable actually it is 12 almost normal. Follow Hgb evry 3 months. He follows with GI  CAD On Plavix, Beta blocker and Statin  s/p CVA Continue Plavix. Insomnia Will restart schedule melatonin Cut back on his diet coke. Family/ staff Communication:   Labs/tests ordered:    CBC, Fasting Lipid and A1 C  In 4 weeks.  Total time spent in this patient care encounter was 25_ minutes; greater than 50% of the visit spent counseling patient and coordinating care for problems addressed at this encounter.

## 2016-11-10 DIAGNOSIS — R279 Unspecified lack of coordination: Secondary | ICD-10-CM | POA: Diagnosis not present

## 2016-11-10 DIAGNOSIS — I639 Cerebral infarction, unspecified: Secondary | ICD-10-CM | POA: Diagnosis not present

## 2016-11-10 DIAGNOSIS — M6281 Muscle weakness (generalized): Secondary | ICD-10-CM | POA: Diagnosis not present

## 2016-11-11 DIAGNOSIS — M6281 Muscle weakness (generalized): Secondary | ICD-10-CM | POA: Diagnosis not present

## 2016-11-11 DIAGNOSIS — I639 Cerebral infarction, unspecified: Secondary | ICD-10-CM | POA: Diagnosis not present

## 2016-11-11 DIAGNOSIS — R279 Unspecified lack of coordination: Secondary | ICD-10-CM | POA: Diagnosis not present

## 2016-11-12 DIAGNOSIS — R279 Unspecified lack of coordination: Secondary | ICD-10-CM | POA: Diagnosis not present

## 2016-11-12 DIAGNOSIS — M6281 Muscle weakness (generalized): Secondary | ICD-10-CM | POA: Diagnosis not present

## 2016-11-12 DIAGNOSIS — I639 Cerebral infarction, unspecified: Secondary | ICD-10-CM | POA: Diagnosis not present

## 2016-11-15 DIAGNOSIS — I639 Cerebral infarction, unspecified: Secondary | ICD-10-CM | POA: Diagnosis not present

## 2016-11-15 DIAGNOSIS — M6281 Muscle weakness (generalized): Secondary | ICD-10-CM | POA: Diagnosis not present

## 2016-11-15 DIAGNOSIS — R279 Unspecified lack of coordination: Secondary | ICD-10-CM | POA: Diagnosis not present

## 2016-11-23 ENCOUNTER — Encounter: Payer: Self-pay | Admitting: Internal Medicine

## 2016-12-01 ENCOUNTER — Non-Acute Institutional Stay (SKILLED_NURSING_FACILITY): Payer: Medicare Other | Admitting: Internal Medicine

## 2016-12-01 ENCOUNTER — Encounter: Payer: Self-pay | Admitting: Internal Medicine

## 2016-12-01 DIAGNOSIS — I2510 Atherosclerotic heart disease of native coronary artery without angina pectoris: Secondary | ICD-10-CM | POA: Diagnosis not present

## 2016-12-01 DIAGNOSIS — Z8673 Personal history of transient ischemic attack (TIA), and cerebral infarction without residual deficits: Secondary | ICD-10-CM

## 2016-12-01 DIAGNOSIS — I10 Essential (primary) hypertension: Secondary | ICD-10-CM | POA: Diagnosis not present

## 2016-12-01 DIAGNOSIS — D500 Iron deficiency anemia secondary to blood loss (chronic): Secondary | ICD-10-CM | POA: Diagnosis not present

## 2016-12-01 DIAGNOSIS — E785 Hyperlipidemia, unspecified: Secondary | ICD-10-CM | POA: Diagnosis not present

## 2016-12-01 DIAGNOSIS — E1151 Type 2 diabetes mellitus with diabetic peripheral angiopathy without gangrene: Secondary | ICD-10-CM

## 2016-12-01 DIAGNOSIS — R6251 Failure to thrive (child): Secondary | ICD-10-CM

## 2016-12-01 DIAGNOSIS — R627 Adult failure to thrive: Secondary | ICD-10-CM

## 2016-12-01 NOTE — Progress Notes (Signed)
Location:   [facility] Room Number: 104/W Place of Service:  SNF (31) Provider:  Gardiner Fanti, Rene Kocher, MD  Patient Care Team: [name], MD as PCP - General (Internal Medicine) Danie Binder, MD as Consulting Physician (Gastroenterology)  Extended Emergency Contact Information Primary Emergency Contact: Dorathy Kinsman States of [place] Phone: [phone] Relation: Daughter Secondary Emergency Contact: [place] of [place] Phone: [phone] Relation: Daughter  Code Status:  DNR Goals of care: Advanced Directive information Advanced Directives 12/01/2016  Does Patient Have a Medical Advance Directive? Yes  Type of Advance Directive Out of facility DNR (pink MOST or yellow form)  Does patient want to make changes to medical advance directive? No - Patient declined  Copy of [place] in Chart? -  Would patient like information on creating a medical advance directive? -     Chief Complaint  Patient presents with  . Medical Management of Chronic Issues  Medical management of chronic medical conditions including diabetes type 2-hypertension-failure to thrive-constipation-anemia-chronic blood loss-coronary artery disease-history CVA-insomnia- HPI:  Pt is a 82 y.o. male seen today for medical management of chronic diseases.  As noted above continues to enjoy. If stability.  Initially came here with significant failure to thrive issues but this has turned around quite dramatically he has a great appetite although he does not quite a bit-his weight is stable at 184.8.  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  He also has a history of coronary artery disease continues on Plavix as well as a beta blocker  and statin-update lipid panel is pending-LDL was 76 back on lab in November 2017.  He has a history CVA he is on Plavix he appears to have fairly minimal residual deficits.  He does have a history of opacity of his left eye and shadows in his right eye.  Recently complain of insomnia and he is on melatonin which apparently is helping some.  He also has a significant history of hypertension with a history of hypertensive urgency at one point past he is on numerous agents including labetalol-Lopressor-and Norvasc-according to nursing this is fairly well-controlled with systolics in the 665L-935T range often I got a manual 150/70 today I have ordered blood pressure checks twice a day for a week so we can see what the long-term control is here.  He also has history diabetes type 2 he is on Glucophage as well as a niece never hemoglobin A1c recently was 6.9 appears relatively baseline CBGs recently of been more in the mid 100s to at times slightly over 200.  He also was treated proximal LIMA to go for UTI with Proteus he does not complain of dysuria today.  He does have a history of BPH and continues on Flomax apparently this has not been an issue in some time and he is voiding apparently fairly well   Past Medical History:  Diagnosis Date  . Anemia due to chronic blood loss   . CAD (coronary artery disease)    cath 11/2007: 70% LM ISR, SVG-LAD OK, SVG-D1 100%, IMA-D2 OK, SVG-OM OK, 4-vessel CABG 2000.  Marland Kitchen CAD (coronary artery disease)    s/p of 95% L main artery stenosis, 4/03: cutting balloon PTCA of 80% in-stent restenosis, 7/04. preserved  L ventriculr function  . Carotid bruit    bilateral. no sigificant distal abd atherosclerosis by recent cath.   . Chronic bronchitis ([place])   . Constipation   . COPD (chronic obstructive pulmonary disease) (HCC)    ongoing tobacco   . CVA (cerebral infarction)   . DM2 (diabetes mellitus, type 2) ([place])   . Dyslipidemia   . Dysphagia   . Glaucoma   .  HTN (hypertension)    Past Surgical History:  Procedure Laterality Date  . CARDIAC CATHETERIZATION    . COLONOSCOPY N/A 05/14/2015   POOR PREP  . COLONOSCOPY N/A 06/02/2015   SLF: 1. four colorectal polyps removed. no source for anemia identified. 2. the left colon is redundant 3. moderate sized internal hemorroids.   . ESOPHAGOGASTRODUODENOSCOPY N/A 05/14/2015   CHRONIC GASTRITIS  . GIVENS CAPSULE STUDY N/A 06/17/2015   Procedure: GIVENS CAPSULE STUDY;  Surgeon: Danie Binder, MD;  [facility];  Service: Endoscopy;  Laterality: N/A;  0800    No Known Allergies  Outpatient Encounter Prescriptions as of 12/01/2016  Medication Sig  . amLODipine (NORVASC) 2.5 MG tablet Take 5 mg by mouth daily.   . Calcium Carbonate-Vitamin D (OSCAL 500/200 D-3 PO) 1 Tablet once a day by mouth . Can't be given with iron  . clopidogrel (PLAVIX) 75 MG tablet Take 75 mg by mouth daily.  Marland Kitchen docusate sodium (COLACE) 100 MG capsule Take 100 mg by mouth 2 (two) times daily.  . ferrous sulfate 325 (65 FE) MG tablet Take 1 tablet (325 mg total) by mouth 3 (three) times daily with meals.  Marland Kitchen labetalol (NORMODYNE) 200 MG tablet Take 200 mg by mouth 3 (three) times daily. Reported on 10/22/2015  . linaclotide (LINZESS) 72 MCG capsule Take 72 mcg by mouth daily before breakfast.  . lisinopril (PRINIVIL,ZESTRIL) 40 MG tablet Take 40 mg by mouth daily.  . metFORMIN (GLUCOPHAGE) 500 MG tablet Take 500 mg by mouth 2 (two) times daily with a meal.   . Multiple Vitamin (MULTIVITAMIN) tablet Take 1 tablet by mouth daily.  . Omega-3 Fatty Acids (FISH OIL) 1000 MG CAPS Take 1,000 mg by mouth 3 (three) times daily.  . pantoprazole (PROTONIX) 40 MG tablet Take 40 mg by mouth daily.  . pravastatin (PRAVACHOL) 80 MG tablet Take 80 mg by mouth daily.  Marland Kitchen senna (SENOKOT) 8.6 MG TABS tablet Take 2 tablets (17.2 mg total) by mouth daily.  . sodium chloride (OCEAN) 0.65 % SOLN nasal spray Place 1 spray into both nostrils 2 (two)  times daily.  . tamsulosin (FLOMAX) 0.4 MG CAPS capsule Take 0.4 mg by mouth daily after supper.   No facility-administered encounter medications on file as of 12/01/2016.      Review of Systems  In general does not complain of any fever or chills continues a very good appetite  Skin does not complain of rashes or itching.  Head ears eyes nose mouth and throat does not complaining sore throat-he is legally blindsays he sees shadows--this appears to be baseline as opacity of the left eye  Resp--does not complain of shortness breath or cough.  Cardiac no chest pain.Or significant lower extremity edema  GI is not complaining of any nausea vomiting diarrhea or constipation   GU  History of UTIs in BPH but does not complain of dysuria today treated for Proteus infection in late June 2018   Muscle skeletal is not complaining of joint pain.  Neurologic does not complain of dizziness or  headache or syncope.  Psych nursing staff does not report any behavior issues continues to be pleasant cooperativetalkative at times will sing a bit in the hallway-he is a former radi0 DJ   Immunization History  Administered Date(s) Administered  . Influenza,inj,Quad PF,36+ Mos 03/07/2015  . Influenza-Unspecified 02/03/2016  . PPD Test 03/10/2015, 03/24/2015  . Pneumococcal-Unspecified 02/10/2016   Pertinent  Health Maintenance Due  Topic Date Due  . INFLUENZA VACCINE  04/02/2017 (Originally 12/01/2016)  . OPHTHALMOLOGY EXAM  02/09/2017  . PNA vac Low Risk Adult (2 of 2 - PCV13) 02/09/2017  . HEMOGLOBIN A1C  04/08/2017  . URINE MICROALBUMIN  06/25/2017  . FOOT EXAM  09/07/2017   No flowsheet data found. Functional Status Survey:    Vitals:   12/01/16 1437  BP: (!) 137/50  Pulse: (!) 56  Resp: 20  Temp: (!) 97.4 F (36.3 C)  TempSrc: Oral  SpO2: 98%  Weight: 184 lb 12.8 oz (83.8 kg)  Height: 5\' 9"  (1.753 m)  Again manual blood pressure today was 150/70 Body mass index  is 27.29 kg/m. Physical Exam Gen. this is a very pleasant elderly male in no distress sitting comfortably in his wheelchair continues to be in good spirits talkative smiling.  His skin is warm and dry.  Eyes does have a history of legally blindness with some vision apparently in his right eye left eye has an opacity says he sees shadows with his right eye  Oropharynx clear mucous membranes moist.  Chest is clear to auscultation there is no labored breathing.  Heart is regular rate and rhythm slightly bradycardic in the high 50s with a 2/6 systolic murmur as very minimal lower extremity edema. Which is baseline  His abdomen soft nontender somewhat obese with positive bowel sounds.  GU-could not really appreciate any suprapubic distention he does have some mild suprapubic tenderness to palpation no  CV tenderness  Muscle skeletal moves all extremities at baseline with a history of minimal left-sided weakness--largely ambulates in a wheelchair.  Neurologic he continues with left facial weakness and speech is relatively clear however has some  mild left-sided weakness as noted above but he is doing where he well with supportive care here.  Psych he is grossly alert and oriented pleasant and appropriate in good spirits jovial person Labs reviewed:  Recent Labs  08/16/16 1430 08/31/16 0700 10/23/16 0420  NA 138 138 142  K 3.5 3.9 4.5  CL 106 105 109  CO2 22 26 22   GLUCOSE 145* 120* 106*  BUN 13 17 18   CREATININE 0.89 0.87 0.99  CALCIUM 8.5* 9.2 9.6    Recent Labs  06/25/16 0700 07/26/16 0700 08/16/16 1430  AST  --  17 21  ALT  --  13* 18  ALKPHOS  --  53 54  BILITOT  --  0.5 0.2*  PROT  --  6.9 6.8  ALBUMIN 3.9 3.4* 3.4*    Recent Labs  08/16/16 1430 08/31/16 0700 10/23/16 0420  WBC 10.2 8.6 7.1  NEUTROABS 7.1 5.3 4.0  HGB 11.4* 12.0* 12.0*  HCT 33.3* 36.9* 36.0*  MCV 84.9 86.6 84.9  PLT 276 357 260   Lab Results  Component Value Date   TSH  1.064 10/31/2015   Lab Results  Component Value Date   HGBA1C 6.9 (H) 10/07/2016   Lab Results  Component Value Date   CHOL 124 03/15/2016   HDL 33 (L) 03/15/2016   LDLCALC 76 03/15/2016   TRIG 75 03/15/2016   CHOLHDL  3.8 03/15/2016    Significant Diagnostic Results in last 30 days:  No results found.  Assessment/Plan  #1 history of hypertension as noted above has some variable systolics here he is on labetalol-Lopressor and Norvasc will order blood pressure checks twice a day for a week and have a log for review for follow-up.  #2 diabetes type 2 this appears under relatively good control hemoglobin A1c 6.9 blood sugars appear to be more in the mid 100s earlier in the day he has some 200 readings later in the day I suspect this is secondary to dietary noncompliance at this point will monitor and encourage appropriate nutritional snacks.  #3-history of coronary artery disease this has been asymptomatic for an extended period of time he is on Plavix as well as a beta blocker and a statin LDL was 76 and lab done back in November 2017 updated lipid panel has been ordered for August 7.  #4 history of anemia with chronic blood loss with a history of AVM malformations this also has been stable for an extended period of time he is on Protonix-hemoglobin has been stable most the 12.0 on lab done in June 2018 update a CBC is pending for August 7.  #5 history CVA and has fairly minimal deficits here he is on Plavix as well as a statin.  #6 history of hyperlipidemia as noted he is on pravastatin LDL 76 back in November 2017 update lipid panel is pending.--Of note he is also on fish oil  #7 history of constipation this was thought at one point to be a significant issue by GI he is on Linzess  appears largely resolved-of note he is also on Colace  #8 failure to thrive-this is not really been an issue in some time he has done remarkably well with his appetite has gained a significant amount await  appears to be doing very well with supportive care here.  #9 -he is on Flomax this has been stable at times she will have a UTI she usually responds to antibiotics is not complaining of dysuria today.  10 history of blindness he appears to be doing well with supportive care often sits in the hallway and is able to recognize people by their voices-continues to be gregarious and engaging.  #11 history of right MCA stenosis he is on Plavix as well as a beta blocker and ACE inhibitor.  #12 history of occasional bradycardia he is on a beta blocker he does not appear to be symptomatic-at this point will monitor he appears to be stable in this regards-he appears to tolerate pulses in the 50s very well  Of note an updated CBC-basic metabolic panel-hemoglobin A1c-and fasting lipid panel is pending for August 7.  [id number] note greater than 40 minutes spent assessing patient-discussing his status with nursing staff-reviewing his chart-reviewing his labs-and coordinating and formulating a plan of care for numerous diagnoses-of note greater than 50% of time spent coordinating plan of care

## 2016-12-06 ENCOUNTER — Encounter: Payer: Self-pay | Admitting: Gastroenterology

## 2016-12-06 ENCOUNTER — Ambulatory Visit (INDEPENDENT_AMBULATORY_CARE_PROVIDER_SITE_OTHER): Payer: Medicare Other | Admitting: Gastroenterology

## 2016-12-06 VITALS — BP 114/60 | HR 59 | Temp 97.9°F | Ht 69.0 in | Wt 183.2 lb

## 2016-12-06 DIAGNOSIS — K5900 Constipation, unspecified: Secondary | ICD-10-CM

## 2016-12-06 DIAGNOSIS — D500 Iron deficiency anemia secondary to blood loss (chronic): Secondary | ICD-10-CM

## 2016-12-06 NOTE — Progress Notes (Signed)
Primary Care Physician: [name], MD  Primary Gastroenterologist:  Barney Drain, MD   Chief Complaint  Patient presents with  . Constipation    HPI: [name] is a 82 y.o. 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 Continues to reside at the [facility]. Labs back in June indicate stable mild anemia with hemoglobin of 12. Ferritin was 31 back in May. Patient is on chronic iron, Plavix, pantoprazole, Linzess. He continues to thrive. He continues to gain weight. Initially back in January 2017 he was 140 pounds, 169 pounds in May 2017. At 183 today.  According to nursing home provider, patient continues to thrive, eats well. Patient denies abdominal pain, any GI concerns. There's been no reported melena or rectal bleeding. Bowel function is well managed on current regimen. Appetite is been good.  Current Outpatient Prescriptions on File Prior to Visit  Medication Sig Dispense Refill  . amLODipine (NORVASC) 2.5 MG tablet Take 5 mg by mouth daily.     . Calcium Carbonate-Vitamin D (OSCAL 500/200 D-3 PO) 1 Tablet once a day by mouth . Can't be given with iron    . clopidogrel (PLAVIX) 75 MG tablet Take 75 mg by mouth daily.    Marland Kitchen docusate sodium (COLACE) 100 MG capsule Take 100 mg by mouth 2 (two) times daily.    . ferrous sulfate 325 (65 FE) MG tablet Take 1 tablet (325 mg total) by mouth 3 (three) times daily with meals. 90 tablet 11  . labetalol (NORMODYNE) 200 MG tablet Take 200 mg by mouth 3 (three) times daily. Reported on 10/22/2015    . linaclotide (LINZESS) 72 MCG capsule Take 72 mcg by mouth daily before breakfast.    . lisinopril (PRINIVIL,ZESTRIL) 40 MG tablet Take 40 mg by mouth daily.    . metFORMIN (GLUCOPHAGE) 500 MG tablet Take 500 mg by mouth 2 (two) times daily with a meal.     . Multiple Vitamin (MULTIVITAMIN) tablet Take 1 tablet by mouth daily.    . Omega-3 Fatty Acids (FISH OIL) 1000  MG CAPS Take 1,000 mg by mouth 3 (three) times daily.    . pantoprazole (PROTONIX) 40 MG tablet Take 40 mg by mouth daily.    . pravastatin (PRAVACHOL) 80 MG tablet Take 80 mg by mouth daily.    Marland Kitchen senna (SENOKOT) 8.6 MG TABS tablet Take 2 tablets (17.2 mg total) by mouth daily. 120 each 0  . sodium chloride (OCEAN) 0.65 % SOLN nasal spray Place 1 spray into both nostrils 2 (two) times daily.    . tamsulosin (FLOMAX) 0.4 MG CAPS capsule Take 0.4 mg by mouth daily after supper.     No current facility-administered medications on file prior to visit.      No current outpatient prescriptions on file.   No current facility-administered medications for this visit.     Allergies as of 12/06/2016  . (No Known Allergies)    ROS:  General: Negative for anorexia, weight loss, fever, chills, fatigue, weakness. ENT: Negative for hoarseness, difficulty swallowing , nasal congestion. CV: Negative for chest pain, angina, palpitations, dyspnea on exertion, peripheral edema.  Respiratory: Negative for dyspnea at rest, dyspnea on exertion, cough, sputum, wheezing.  GI: See history of present illness. GU:  Negative for dysuria, hematuria, urinary incontinence, urinary frequency, nocturnal urination.  Endo: Negative for unusual weight change.    Physical Examination:   BP 114/60  Pulse (!) 59   Temp 97.9 F (36.6 C) (Oral)   Ht 5\' 9"  (1.753 m)   Wt 183 lb 3.2 oz (83.1 kg)   BMI 27.05 kg/m   General: Well-nourished, well-developed in no acute distress.  Eyes: No icterus. Mouth: Oropharyngeal mucosa moist and pink , no lesions erythema or exudate. Lungs: Clear to auscultation bilaterally.  Heart: Regular rate and rhythm, no murmurs rubs or gallops.  Abdomen: Bowel sounds are normal, nontender, nondistended, no hepatosplenomegaly or masses, no abdominal bruits or hernia , no rebound or guarding.   Extremities: No lower extremity edema. No clubbing or deformities. Neuro: Alert and oriented x  4   Skin: Warm and dry, no jaundice.   Psych: Alert and cooperative, normal mood and affect.  Labs:  Lab Results  Component Value Date   CREATININE 0.99 10/23/2016   BUN 18 10/23/2016   NA 142 10/23/2016   K 4.5 10/23/2016   CL 109 10/23/2016   CO2 22 10/23/2016   Lab Results  Component Value Date   ALT 18 08/16/2016   AST 21 08/16/2016   ALKPHOS 54 08/16/2016   BILITOT 0.2 (L) 08/16/2016   Lab Results  Component Value Date   WBC 7.1 10/23/2016   HGB 12.0 (L) 10/23/2016   HCT 36.0 (L) 10/23/2016   MCV 84.9 10/23/2016   PLT 260 10/23/2016   Lab Results  Component Value Date   FERRITIN 31 08/31/2016     Imaging Studies: No results found.

## 2016-12-06 NOTE — Patient Instructions (Addendum)
1. Continue Linzess and Iron as before.  2. Recommend periodic CBC at the nursing home to keep check for decline in Hemoglobin. Notify us if drop in Hemoglobin. 3. Return office visit in one year.

## 2016-12-06 NOTE — Assessment & Plan Note (Signed)
Clinically doing well on current regimen. Plan to see patient back in one year or sooner if needed.

## 2016-12-06 NOTE — Assessment & Plan Note (Signed)
Hemoglobin remains stable. Would advise periodic CBCs/ferritin i.e. every 3-4 months to monitor for decline in hemoglobin given history of chronic occult GI bleeding. Continue iron therapy. We'll plan to see the patient back in one year, call sooner if needed.

## 2016-12-07 ENCOUNTER — Other Ambulatory Visit (HOSPITAL_COMMUNITY)
Admission: RE | Admit: 2016-12-07 | Discharge: 2016-12-07 | Disposition: A | Discharge status: Home / Self Care | Payer: Medicare Other | Source: Skilled Nursing Facility | Attending: Internal Medicine | Admitting: Internal Medicine

## 2016-12-07 DIAGNOSIS — E119 Type 2 diabetes mellitus without complications: Secondary | ICD-10-CM | POA: Diagnosis not present

## 2016-12-07 LAB — CBC
HCT: 35.8 % — ABNORMAL LOW (ref 39.0–52.0)
Hemoglobin: 11.9 g/dL — ABNORMAL LOW (ref 13.0–17.0)
MCH: 28.5 pg (ref 26.0–34.0)
MCHC: 33.2 g/dL (ref 30.0–36.0)
MCV: 85.6 fL (ref 78.0–100.0)
PLATELETS: 289 10*3/uL (ref 150–400)
RBC: 4.18 MIL/uL — AB (ref 4.22–5.81)
RDW: 14 % (ref 11.5–15.5)
WBC: 8 10*3/uL (ref 4.0–10.5)

## 2016-12-07 LAB — BASIC METABOLIC PANEL
Anion gap: 8 (ref 5–15)
BUN: 15 mg/dL (ref 6–20)
CHLORIDE: 106 mmol/L (ref 101–111)
CO2: 25 mmol/L (ref 22–32)
CREATININE: 0.92 mg/dL (ref 0.61–1.24)
Calcium: 9.1 mg/dL (ref 8.9–10.3)
GFR calc non Af Amer: >60 mL/min (ref >60)
Glucose, Bld: 79 mg/dL (ref 65–99)
Potassium: 4 mmol/L (ref 3.5–5.1)
Sodium: 139 mmol/L (ref 135–145)

## 2016-12-07 LAB — LIPID PANEL
CHOL/HDL RATIO: 3.3 ratio
Cholesterol: 126 mg/dL (ref 0–200)
HDL: 38 mg/dL — ABNORMAL LOW (ref >40)
LDL Cholesterol: 68 mg/dL (ref 0–99)
Triglycerides: 98 mg/dL (ref <150)
VLDL: 20 mg/dL (ref 0–40)

## 2016-12-07 NOTE — Progress Notes (Signed)
CC'ED TO PCP 

## 2016-12-08 LAB — HEMOGLOBIN A1C
Hgb A1c MFr Bld: 6.6 % — ABNORMAL HIGH (ref 4.8–5.6)
MEAN PLASMA GLUCOSE: 143 mg/dL

## 2016-12-29 ENCOUNTER — Encounter: Payer: Self-pay | Admitting: Internal Medicine

## 2016-12-29 ENCOUNTER — Non-Acute Institutional Stay (SKILLED_NURSING_FACILITY): Payer: Medicare Other | Admitting: Internal Medicine

## 2016-12-29 DIAGNOSIS — I10 Essential (primary) hypertension: Secondary | ICD-10-CM | POA: Diagnosis not present

## 2016-12-29 DIAGNOSIS — H109 Unspecified conjunctivitis: Secondary | ICD-10-CM | POA: Diagnosis not present

## 2016-12-29 NOTE — Progress Notes (Signed)
Location:    [facility] Room Number: 104/W Place of Service:  SNF [phone]) Provider:  Freddi Starr, MD  Patient Care Team: [name], MD as PCP - General (Internal Medicine) Danie Binder, MD as Consulting Physician (Gastroenterology)  Extended Emergency Contact Information Primary Emergency Contact: Dorathy Kinsman [place] Phone: [phone] Relation: Daughter Secondary Emergency Contact: [place] Phone: [phone] Relation: Daughter  Code Status:  DNR Goals of care: Advanced Directive information Advanced Directives 12/29/2016  Does Patient Have a Medical Advance Directive? Yes  Type of Advance Directive Out of facility DNR (pink MOST or yellow form)  Does patient want to make changes to medical advance directive? No - Patient declined  Copy of [place] in Chart? -  Would patient like information on creating a medical advance directive? -     Chief Complaint  Patient presents with  . Acute Visit    Right eye redness    HPI:  Pt is a 82 y.o. male seen today for an acute visit for Follow-up of erythematous right eye.  Patient is legally blind does have a chronic history of left eye opacity says he can see shadows with his right eye.  today nursing noted he has some erythema to the conjunctivae of his right eye-.  He says the eye feels irritated does not complain of visual changes from baseline--does see shadows-.  Vital signs appear to be stable he is afebrile do note his systolic blood pressures in the 150s today--I took it manually and got systolic of 956-LO does have a history of hypertension is on labetalol 200 mg 3 times a day lisinopril 40 mg a day and Norvasc 5 mg a day  His other medical issues include diabetes type 2 appears well controlled with blood sugars largely in the lower 100s he is on Glucophage.  Also has a history of anemia  with a BMI malformation C hemoglobin has been stable for some time and recently just saw GI and thought to be doing well with recommendation to continue his iron-also history constipation which appears stable on current medications.  Regards to CVA he's doing well with supportive care as well as coronary artery disease with no complaints of chest pain   Past Medical History:  Diagnosis Date  . Anemia due to chronic blood loss   . CAD (coronary artery disease)    cath 11/2007: 70% LM ISR, SVG-LAD OK, SVG-D1 100%, IMA-D2 OK, SVG-OM OK, 4-vessel CABG 2000.  Marland Kitchen CAD (coronary artery disease)    s/p of 95% L main artery stenosis, 4/03: cutting balloon PTCA of 80% in-stent restenosis, 7/04. preserved L ventriculr function  . Carotid bruit    bilateral. no sigificant distal abd atherosclerosis by recent cath.   . Chronic bronchitis ([place])   . Constipation   . COPD (chronic obstructive pulmonary disease) ([facility])    ongoing tobacco   . CVA (cerebral infarction)   . DM2 (diabetes mellitus, type 2) ([place])   . Dyslipidemia   . Dysphagia   . Glaucoma   . HTN (hypertension)    Past Surgical History:  Procedure Laterality Date  . CARDIAC CATHETERIZATION    . COLONOSCOPY N/A 05/14/2015   POOR PREP  . COLONOSCOPY N/A 06/02/2015   SLF: 1. four colorectal polyps removed. no source for anemia identified. 2. the left colon is redundant 3. moderate sized internal hemorroids.   . ESOPHAGOGASTRODUODENOSCOPY  N/A 05/14/2015   CHRONIC GASTRITIS  . GIVENS CAPSULE STUDY N/A 06/17/2015   Procedure: GIVENS CAPSULE STUDY;  Surgeon: Danie Binder, MD;  Location[facility];  Service: Endoscopy;  Laterality: N/A;  0800    No Known Allergies  Outpatient Encounter Prescriptions as of 12/29/2016  Medication Sig  . amLODipine (NORVASC) 2.5 MG tablet Take 5 mg by mouth daily.   . Calcium Carbonate-Vitamin D (OSCAL 500/200 D-3 PO) 1 Tablet once a day by mouth . Can't be given with iron  . clopidogrel (PLAVIX) 75 MG  tablet Take 75 mg by mouth daily.  Marland Kitchen docusate sodium (COLACE) 100 MG capsule Take 100 mg by mouth 2 (two) times daily.  . ferrous sulfate (QC FERROUS SULFATE) 325 (65 FE) MG tablet Take 325 mg by mouth 2 (two) times daily with a meal.  . labetalol (NORMODYNE) 200 MG tablet Take 200 mg by mouth 3 (three) times daily. Reported on 10/22/2015  . linaclotide (LINZESS) 72 MCG capsule Take 72 mcg by mouth daily before breakfast.  . lisinopril (PRINIVIL,ZESTRIL) 40 MG tablet Take 40 mg by mouth daily.  . metFORMIN (GLUCOPHAGE) 500 MG tablet Take 500 mg by mouth 2 (two) times daily with a meal.   . Multiple Vitamin (MULTIVITAMIN) tablet Take 1 tablet by mouth daily.  . Omega-3 Fatty Acids (FISH OIL) 1000 MG CAPS Take 1,000 mg by mouth 3 (three) times daily.  . pantoprazole (PROTONIX) 40 MG tablet Take 40 mg by mouth daily.  . pravastatin (PRAVACHOL) 80 MG tablet Take 80 mg by mouth daily.  Marland Kitchen senna (SENOKOT) 8.6 MG TABS tablet Take 2 tablets (17.2 mg total) by mouth daily.  . sodium chloride (OCEAN) 0.65 % SOLN nasal spray Place 1 spray into both nostrils 2 (two) times daily.  . tamsulosin (FLOMAX) 0.4 MG CAPS capsule Take 0.4 mg by mouth daily after supper.  . [DISCONTINUED] ferrous sulfate 325 (65 FE) MG tablet Take 1 tablet (325 mg total) by mouth 3 (three) times daily with meals. (Patient taking differently: Take 325 mg by mouth 2 (two) times daily with a meal. )   No facility-administered encounter medications on file as of 12/29/2016.     Review of Systems    In general does not complaining any fever or chills says he feels well.  Skin does not complain of diaphoresis or itching.  Eyes does complain of right eye irritation does not complain of any acute visual change she is visibly blind.  Throat does not complain of any sore throat.  Respiratory denies shortness breath or cough.  Cardiac denies chest pain or edema.  GI does not complain of any abdominal discomfort continues to have a  very good appetite.  Muscle skeletal is not complaining of joint pain at this time.  Neurologic does not complain of dizziness headache or numbness.  Psych appears to be very good spirits nurses stat continues to not note any behavioral issues A  Immunization History  Administered Date(s) Administered  . Influenza,inj,Quad PF,6+ Mos 03/07/2015  . Influenza-Unspecified 02/03/2016  . PPD Test 03/10/2015, 03/24/2015  . Pneumococcal-Unspecified 02/10/2016   Pertinent  Health Maintenance Due  Topic Date Due  . INFLUENZA VACCINE  04/02/2017 (Originally 12/01/2016)  . OPHTHALMOLOGY EXAM  02/09/2017  . PNA vac Low Risk Adult (2 of 2 - PCV13) 02/09/2017  . HEMOGLOBIN A1C  06/09/2017  . URINE MICROALBUMIN  06/25/2017  . FOOT EXAM  09/07/2017   No flowsheet data found. Functional Status Survey:  Vitals:   12/29/16 0913  BP: (!) 155/68  Pulse: 61  Resp: 18  Temp: (!) 97.5 F (36.4 C)  TempSrc: Oral  SpO2: 98%    Physical Exam   Is in general he is no in general this is a very pleasant elderly male in no distress sitting comfortably in his wheelchair.  His skin is warm and dry.  Eyes he does have a conjunctivitis right arm and male in no distress sitting comfortably in his wheelchair  Oropharynx is clear mucous membranes moist.  Eyes he does have an erythematous right conjunctiva with some clear drainage do not see  Orbital erythema-can see shadows which is his baseline-she continues to have opacity of his left eye.  Heart is regular rate and rhythm he does have a history of a  murmur although this is difficult to auscultate today secondary to heart sounds being somewhat distant he does not have significant lower extremity edema.  Chest is clear to auscultation there is no labored breathing  Abdomen is obese soft nontender with positive bowel sounds.  Musculoskeletal does move all extremities 4 has some very minimal left-sided weakness  Neurologic some slight left  facial weakness speech continues to be fairly clear-again does have some history of quite mild left-sided weakness.  Psych grossly alert and oriented pleasant and appropriate    :  Recent Labs  08/31/16 0700 10/23/16 0420 12/07/16 0700  NA 138 142 139  K 3.9 4.5 4.0  CL 105 109 106  CO2 26 22 25   GLUCOSE 120* 106* 79  BUN 17 18 15   CREATININE 0.87 0.99 0.92  CALCIUM 9.2 9.6 9.1    Recent Labs  06/25/16 0700 07/26/16 0700 08/16/16 1430  AST  --  17 21  ALT  --  13* 18  ALKPHOS  --  53 54  BILITOT  --  0.5 0.2*  PROT  --  6.9 6.8  ALBUMIN 3.9 3.4* 3.4*    Recent Labs  08/16/16 1430 08/31/16 0700 10/23/16 0420 12/07/16 0700  WBC 10.2 8.6 7.1 8.0  NEUTROABS 7.1 5.3 4.0  --   HGB 11.4* 12.0* 12.0* 11.9*  HCT 33.3* 36.9* 36.0* 35.8*  MCV 84.9 86.6 84.9 85.6  PLT 276 357 260 289   Lab Results  Component Value Date   TSH 1.064 10/31/2015   Lab Results  Component Value Date   HGBA1C 6.6 (H) 12/07/2016   Lab Results  Component Value Date   CHOL 126 12/07/2016   HDL 38 (L) 12/07/2016   LDLCALC 68 12/07/2016   TRIG 98 12/07/2016   CHOLHDL 3.3 12/07/2016    Significant Diagnostic Results in last 30 days:  No results found.  Assessment/Pl1 right eye irritation-at this point will treat as allergic conjunctivitis with Patanol eyedrops 1 drop twice a day for 7 days and monitor-speaking with nursing there is some thought there may be some irritation from patient possibly sticking his finger in his eye area-at this point will need monitoring--and will start the eyedrop.  #2 hypertension I did take his blood pressure manually and got a systolic of 992 L order more frequent blood pressure checks daily with a log for provider review before making medication changes she continues on labetalol and lisinopril and Norvasc if consistently elevation consider increasing his Norvasc.  [id number]

## 2016-12-31 ENCOUNTER — Non-Acute Institutional Stay (SKILLED_NURSING_FACILITY): Payer: Medicare Other | Admitting: Internal Medicine

## 2016-12-31 DIAGNOSIS — H109 Unspecified conjunctivitis: Secondary | ICD-10-CM | POA: Diagnosis not present

## 2017-01-02 NOTE — Progress Notes (Signed)
This is an acute visit.  Level care skilled.  Facility is [facility].  Chief complaint-acute visit follow-up conjunctivitis.  History of present illness.  Patient is a pleasant 82 year old male who is seen for follow-up of right eye conjunctivitis.  Patient was seen 2 days ago with an irritated-looking right eye that was erythematous and he complained of feeling irritated.  He has been started on Patanol eyedrops-apparently he has missed some doses yesterday but has been restarted today.  He continues to complain of some eye irritation he does have a history of legal blindness says he can see shadows with his right eye which is unchanged from baseline-se does have opacity of his left eye.  He says the discomfort in the right eye has not increased but he still has some irritation.  Past Medical History:  Diagnosis Date  . Anemia due to chronic blood loss   . CAD (coronary artery disease)    cath 11/2007: 70% LM ISR, SVG-LAD OK, SVG-D1 100%, IMA-D2 OK, SVG-OM OK, 4-vessel CABG 2000.  Marland Kitchen CAD (coronary artery disease)    s/p of 95% L main artery stenosis, 4/03: cutting balloon PTCA of 80% in-stent restenosis, 7/04. preserved L ventriculr function  . Carotid bruit    bilateral. no sigificant distal abd atherosclerosis by recent cath.   . Chronic bronchitis ([place])   . Constipation   . COPD (chronic obstructive pulmonary disease) ([facility])    ongoing tobacco   . CVA (cerebral infarction)   . DM2 (diabetes mellitus, type 2) ([place])   . Dyslipidemia   . Dysphagia   . Glaucoma   . HTN (hypertension)         Past Surgical History:  Procedure Laterality Date  . CARDIAC CATHETERIZATION    . COLONOSCOPY N/A 05/14/2015   POOR PREP  . COLONOSCOPY N/A 06/02/2015   SLF: 1. four colorectal polyps removed. no source for anemia identified. 2. the left colon is redundant 3. moderate sized internal hemorroids.   . ESOPHAGOGASTRODUODENOSCOPY N/A 05/14/2015   CHRONIC  GASTRITIS  . GIVENS CAPSULE STUDY N/A 06/17/2015   Procedure: GIVENS CAPSULE STUDY;  Surgeon: Danie Binder, MD;  [facility];  Service: Endoscopy;  Laterality: N/A;  0800    No Known Allergies      Outpatient Encounter Prescriptions as of 12/29/2016  Medication Sig  . amLODipine (NORVASC) 2.5 MG tablet Take 5 mg by mouth daily.   . Calcium Carbonate-Vitamin D (OSCAL 500/200 D-3 PO) 1 Tablet once a day by mouth . Can't be given with iron  . clopidogrel (PLAVIX) 75 MG tablet Take 75 mg by mouth daily.  Marland Kitchen docusate sodium (COLACE) 100 MG capsule Take 100 mg by mouth 2 (two) times daily.  . ferrous sulfate (QC FERROUS SULFATE) 325 (65 FE) MG tablet Take 325 mg by mouth 2 (two) times daily with a meal.  . labetalol (NORMODYNE) 200 MG tablet Take 200 mg by mouth 3 (three) times daily. Reported on 10/22/2015  . linaclotide (LINZESS) 72 MCG capsule Take 72 mcg by mouth daily before breakfast.  . lisinopril (PRINIVIL,ZESTRIL) 40 MG tablet Take 40 mg by mouth daily.  . metFORMIN (GLUCOPHAGE) 500 MG tablet Take 500 mg by mouth 2 (two) times daily with a meal.   . Multiple Vitamin (MULTIVITAMIN) tablet Take 1 tablet by mouth daily.  . Omega-3 Fatty Acids (FISH OIL) 1000 MG CAPS Take 1,000 mg by mouth 3 (three) times daily.  . pantoprazole (PROTONIX) 40 MG tablet Take 40 mg  by mouth daily.  . pravastatin (PRAVACHOL) 80 MG tablet Take 80 mg by mouth daily.  Marland Kitchen senna (SENOKOT) 8.6 MG TABS tablet Take 2 tablets (17.2 mg total) by mouth daily.  . sodium chloride (OCEAN) 0.65 % SOLN nasal spray Place 1 spray into both nostrils 2 (two) times daily.  . tamsulosin (FLOMAX) 0.4 MG CAPS capsule Take 0.4 mg by mouth daily after supper.  . [DISCONTINUED] ferrous sulfate 325 (65 FE) MG tablet Take 1 tablet (325 mg total) by mouth 3 (three) times daily with meals. (Patient taking differently: Take 325 mg by mouth 2 (two) times daily with a meal. )   No facility-administered encounter medications on  file as of 12/29/2016.     review of systems-as noted above still complains of some right rotation says visual acuity is essentially unchanged he see shadows with his right eye-he has blindness in his left eye with opacity which is not new.   Physical exam.  In general this is a pleasant LE male in no distress lying comfortably in bed.  His skin is warm and dry.  Eyes still continues to have erythema of his right conjunctiva--t visual acuity appears at baseline says he see shadows-he appears to have some clear drainage today -- I do not see any increased erythema of the periorbital area    Assessment and plan.  Continued right eye conjunctivitis?-At this point will continue Patanol eyedrops  apparently he has not received all his doses since order was written 2 days ago-will give this more time if it does not resolve however consider change in treatment or possible ophthalmology consult  [phone]

## 2017-01-04 ENCOUNTER — Non-Acute Institutional Stay (SKILLED_NURSING_FACILITY): Payer: Medicare Other | Admitting: Internal Medicine

## 2017-01-04 ENCOUNTER — Encounter: Payer: Self-pay | Admitting: Internal Medicine

## 2017-01-04 DIAGNOSIS — H109 Unspecified conjunctivitis: Secondary | ICD-10-CM | POA: Diagnosis not present

## 2017-01-04 NOTE — Progress Notes (Signed)
Location:   [facility] Room Number: 104/W Place of Service:  SNF [phone]) Provider:  Freddi Starr, MD  Patient Care Team: [name], MD as PCP - General (Internal Medicine) Danie Binder, MD as Consulting Physician (Gastroenterology)  Extended Emergency Contact Information Primary Emergency Contact: Dorathy Kinsman [place] of [place] Phone: [phone] Relation: Daughter Secondary Emergency Contact: [place] Phone: [phone] Relation: Daughter  Code Status:  DNR Goals of care: Advanced Directive information Advanced Directives 01/04/2017  Does Patient Have a Medical Advance Directive? Yes  Type of Advance Directive Out of facility DNR (pink MOST or yellow form)  Does patient want to make changes to medical advance directive? No - Patient declined  Copy of [place] in Chart? -  Would patient like information on creating a medical advance directive? -     Chief Complaint  Patient presents with  . Acute Visit    Conjunctivitis    HPI:  Pt is a 82 y.o. male seen today for an acute visit for Follow-up of conjunctivitis-we initially saw his right eye last week and did start him on Patanol eyedrops for concerns this possibly was allergic rhinitis he had a erythematous conjunctivae and did report seeing shadows always right eye which he says his baseline he is legally blind.  He did complain of some irritation as well.  Despite being on the Patanol the erythema persists and he is also complaining of continued irritation he says it has not gotten any worse but also has not gotten any better-also there is a question that possibly there have been some visual changes although this is somewhat unclear since he is legally blind and has only been able to see shadows with his right eye.  He continues with opacity of his left eye.  Vital signs are stable otherwise he has no  complaints today   Past Medical History:  Diagnosis Date  . Anemia due to chronic blood loss   . CAD (coronary artery disease)    cath 11/2007: 70% LM ISR, SVG-LAD OK, SVG-D1 100%, IMA-D2 OK, SVG-OM OK, 4-vessel CABG 2000.  Marland Kitchen CAD (coronary artery disease)    s/p of 95% L main artery stenosis, 4/03: cutting balloon PTCA of 80% in-stent restenosis, 7/04. preserved L ventriculr function  . Carotid bruit    bilateral. no sigificant distal abd atherosclerosis by recent cath.   . Chronic bronchitis ([place])   . Constipation   . COPD (chronic obstructive pulmonary disease) ([facility])    ongoing tobacco   . CVA (cerebral infarction)   . DM2 (diabetes mellitus, type 2) ([place])   . Dyslipidemia   . Dysphagia   . Glaucoma   . HTN (hypertension)    Past Surgical History:  Procedure Laterality Date  . CARDIAC CATHETERIZATION    . COLONOSCOPY N/A 05/14/2015   POOR PREP  . COLONOSCOPY N/A 06/02/2015   SLF: 1. four colorectal polyps removed. no source for anemia identified. 2. the left colon is redundant 3. moderate sized internal hemorroids.   . ESOPHAGOGASTRODUODENOSCOPY N/A 05/14/2015   CHRONIC GASTRITIS  . GIVENS CAPSULE STUDY N/A 06/17/2015   Procedure: GIVENS CAPSULE STUDY;  Surgeon: Danie Binder, MD;  Location: [facility];  Service: Endoscopy;  Laterality: N/A;  0800    No Known Allergies  Outpatient Encounter Prescriptions as of 01/04/2017  Medication Sig  . amLODipine (NORVASC) 2.5 MG tablet Take 5 mg by mouth  daily.   . Calcium Carbonate-Vitamin D (OSCAL 500/200 D-3 PO) 1 Tablet once a day by mouth . Can't be given with iron  . clopidogrel (PLAVIX) 75 MG tablet Take 75 mg by mouth daily.  Marland Kitchen docusate sodium (COLACE) 100 MG capsule Take 100 mg by mouth 2 (two) times daily.  . ferrous sulfate (QC FERROUS SULFATE) 325 (65 FE) MG tablet Take 325 mg by mouth 2 (two) times daily with a meal.  . labetalol (NORMODYNE) 200 MG tablet Take 200 mg by mouth 3 (three) times daily. Reported on 10/22/2015    . linaclotide (LINZESS) 72 MCG capsule Take 72 mcg by mouth daily before breakfast.  . lisinopril (PRINIVIL,ZESTRIL) 40 MG tablet Take 40 mg by mouth daily.  . metFORMIN (GLUCOPHAGE) 500 MG tablet Take 500 mg by mouth 2 (two) times daily with a meal.   . Multiple Vitamin (MULTIVITAMIN) tablet Take 1 tablet by mouth daily.  Marland Kitchen olopatadine (PATANOL) 0.1 % ophthalmic solution Place 1 drop into the right eye 2 (two) times daily.  . Omega-3 Fatty Acids (FISH OIL) 1000 MG CAPS Take 1,000 mg by mouth 3 (three) times daily.  . pantoprazole (PROTONIX) 40 MG tablet Take 40 mg by mouth daily.  . pravastatin (PRAVACHOL) 80 MG tablet Take 80 mg by mouth daily.  Marland Kitchen senna (SENOKOT) 8.6 MG TABS tablet Take 2 tablets (17.2 mg total) by mouth daily.  . sodium chloride (OCEAN) 0.65 % SOLN nasal spray Place 1 spray into both nostrils 2 (two) times daily.  . tamsulosin (FLOMAX) 0.4 MG CAPS capsule Take 0.4 mg by mouth daily after supper.   No facility-administered encounter medications on file as of 01/04/2017.     Review of Systems   General is not complaining of fever or chills.  Skin denies diaphoresis or rashes.  Eyes again does complain of some continued irritation with continued erythema right eye.  Respiratory denies shortness breath or cough.  Oropharynx denies sore throat.  Cardiac denies any chest pain palpitations.  Psych does not complaining depression or anxiety continues being good spirits pleasant talkative smiling  Immunization History  Administered Date(s) Administered  . Influenza,inj,Quad PF,6+ Mos 03/07/2015  . Influenza-Unspecified 02/03/2016  . PPD Test 03/10/2015, 03/24/2015  . Pneumococcal-Unspecified 02/10/2016   Pertinent  Health Maintenance Due  Topic Date Due  . INFLUENZA VACCINE  04/02/2017 (Originally 12/01/2016)  . OPHTHALMOLOGY EXAM  02/09/2017  . PNA vac Low Risk Adult (2 of 2 - PCV13) 02/09/2017  . HEMOGLOBIN A1C  06/09/2017  . URINE MICROALBUMIN  06/25/2017  .  FOOT EXAM  09/07/2017   No flowsheet data found. Functional Status Survey:    Vitals:   01/04/17 1337  BP: 140/70  Pulse: 64  Resp: 18  Temp: 98.4 F (36.9 C)  TempSrc: Oral   There is no height or weight on file to calculate BMI. Physical Exam  In general this is a pleasant elderly male in no distress sitting comfortably in his wheelchair.  His skin is warm and dry.  Oropharynx is clear mucous membranes moist.  Eyes continues to have erythema of his right eye conjunctiva-continues to have some clear drainage-says he possibly may have had some visual changes from baseline again is able to only see shadows at baseline.  Chest is clear to auscultation with shallow air entry.  Heart is regular rate and rhythm without murmur gallop or rub.   Labs reviewed:  Recent Labs  08/31/16 0700 10/23/16 0420 12/07/16 0700  NA 138 142 139  K 3.9 4.5 4.0  CL 105 109 106  CO2 26 22 25   GLUCOSE 120* 106* 79  BUN 17 18 15   CREATININE 0.87 0.99 0.92  CALCIUM 9.2 9.6 9.1    Recent Labs  06/25/16 0700 07/26/16 0700 08/16/16 1430  AST  --  17 21  ALT  --  13* 18  ALKPHOS  --  53 54  BILITOT  --  0.5 0.2*  PROT  --  6.9 6.8  ALBUMIN 3.9 3.4* 3.4*    Recent Labs  08/16/16 1430 08/31/16 0700 10/23/16 0420 12/07/16 0700  WBC 10.2 8.6 7.1 8.0  NEUTROABS 7.1 5.3 4.0  --   HGB 11.4* 12.0* 12.0* 11.9*  HCT 33.3* 36.9* 36.0* 35.8*  MCV 84.9 86.6 84.9 85.6  PLT 276 357 260 289   Lab Results  Component Value Date   TSH 1.064 10/31/2015   Lab Results  Component Value Date   HGBA1C 6.6 (H) 12/07/2016   Lab Results  Component Value Date   CHOL 126 12/07/2016   HDL 38 (L) 12/07/2016   LDLCALC 68 12/07/2016   TRIG 98 12/07/2016   CHOLHDL 3.3 12/07/2016    Significant Diagnostic Results in last 30 days:  No results found.  Assessment/Plan  #1 persistent conjunctivitis-I did discuss him fairly extensively with his nurse who is quite familiar-it appears the  Patanol is not really helping-will switch to an antibiotic eyedrop Cipro ophthalmology solution-although I suspect he will need to be seen by ophthalmology and will order an ophthalmology consult he does not appear to be in any distress but conjunctivitis does persist in now is complaining of possibly some visual changes although he is only able to see shadows at baseline.  [id number]

## 2017-01-05 DIAGNOSIS — I639 Cerebral infarction, unspecified: Secondary | ICD-10-CM | POA: Diagnosis not present

## 2017-01-05 DIAGNOSIS — H409 Unspecified glaucoma: Secondary | ICD-10-CM | POA: Diagnosis not present

## 2017-01-05 DIAGNOSIS — R279 Unspecified lack of coordination: Secondary | ICD-10-CM | POA: Diagnosis not present

## 2017-01-05 DIAGNOSIS — M6281 Muscle weakness (generalized): Secondary | ICD-10-CM | POA: Diagnosis not present

## 2017-01-06 DIAGNOSIS — M6281 Muscle weakness (generalized): Secondary | ICD-10-CM | POA: Diagnosis not present

## 2017-01-06 DIAGNOSIS — H409 Unspecified glaucoma: Secondary | ICD-10-CM | POA: Diagnosis not present

## 2017-01-06 DIAGNOSIS — I639 Cerebral infarction, unspecified: Secondary | ICD-10-CM | POA: Diagnosis not present

## 2017-01-06 DIAGNOSIS — R279 Unspecified lack of coordination: Secondary | ICD-10-CM | POA: Diagnosis not present

## 2017-01-07 DIAGNOSIS — H409 Unspecified glaucoma: Secondary | ICD-10-CM | POA: Diagnosis not present

## 2017-01-07 DIAGNOSIS — R279 Unspecified lack of coordination: Secondary | ICD-10-CM | POA: Diagnosis not present

## 2017-01-07 DIAGNOSIS — M6281 Muscle weakness (generalized): Secondary | ICD-10-CM | POA: Diagnosis not present

## 2017-01-07 DIAGNOSIS — I639 Cerebral infarction, unspecified: Secondary | ICD-10-CM | POA: Diagnosis not present

## 2017-01-08 DIAGNOSIS — I639 Cerebral infarction, unspecified: Secondary | ICD-10-CM | POA: Diagnosis not present

## 2017-01-08 DIAGNOSIS — R279 Unspecified lack of coordination: Secondary | ICD-10-CM | POA: Diagnosis not present

## 2017-01-08 DIAGNOSIS — M6281 Muscle weakness (generalized): Secondary | ICD-10-CM | POA: Diagnosis not present

## 2017-01-08 DIAGNOSIS — H409 Unspecified glaucoma: Secondary | ICD-10-CM | POA: Diagnosis not present

## 2017-01-10 ENCOUNTER — Non-Acute Institutional Stay (SKILLED_NURSING_FACILITY): Payer: Medicare Other

## 2017-01-10 DIAGNOSIS — Z0000 Encounter for general adult medical examination without abnormal findings: Secondary | ICD-10-CM

## 2017-01-10 DIAGNOSIS — R279 Unspecified lack of coordination: Secondary | ICD-10-CM | POA: Diagnosis not present

## 2017-01-10 DIAGNOSIS — H409 Unspecified glaucoma: Secondary | ICD-10-CM | POA: Diagnosis not present

## 2017-01-10 DIAGNOSIS — I639 Cerebral infarction, unspecified: Secondary | ICD-10-CM | POA: Diagnosis not present

## 2017-01-10 DIAGNOSIS — M6281 Muscle weakness (generalized): Secondary | ICD-10-CM | POA: Diagnosis not present

## 2017-01-10 NOTE — Patient Instructions (Signed)
[name] , Thank you for taking time to come for your Medicare Wellness Visit. I appreciate your ongoing commitment to your health goals. Please review the following plan we discussed and let me know if I can assist you in the future.   Screening recommendations/referrals: Colonoscopy excluded, pt over age 80 Recommended yearly ophthalmology/optometry visit for glaucoma screening and checkup Recommended yearly dental visit for hygiene and checkup  Vaccinations: Influenza vaccine due Pneumococcal vaccine up to date Prevnar due 02/09/17 Tdap vaccine due, ordered Shingles vaccine not in recrods    Advanced directives: DNR in chart, copies of health care power of attorney and living will are needed   Conditions/risks identified: none  Next appointment: Dr. Lyndel Safe makes rounds  Preventive Care 64 Years and Older, Male Preventive care refers to lifestyle choices and visits with your health care provider that can promote health and wellness. What does preventive care include?  A yearly physical exam. This is also called an annual well check.  Dental exams once or twice a year.  Routine eye exams. Ask your health care provider how often you should have your eyes checked.  Personal lifestyle choices, including:  Daily care of your teeth and gums.  Regular physical activity.  Eating a healthy diet.  Avoiding tobacco and drug use.  Limiting alcohol use.  Practicing safe sex.  Taking low doses of aspirin every day.  Taking vitamin and mineral supplements as recommended by your health care provider. What happens during an annual well check? The services and screenings done by your health care provider during your annual well check will depend on your age, overall health, lifestyle risk factors, and family history of disease. Counseling  Your health care provider may ask you questions about your:  Alcohol use.  Tobacco use.  Drug use.  Emotional well-being.  Home and  relationship well-being.  Sexual activity.  Eating habits.  History of falls.  Memory and ability to understand (cognition).  Work and work Statistician. Screening  You may have the following tests or measurements:  Height, weight, and BMI.  Blood pressure.  Lipid and cholesterol levels. These may be checked every 5 years, or more frequently if you are over 21 years old.  Skin check.  Lung cancer screening. You may have this screening every year starting at age 5 if you have a 30-pack-year history of smoking and currently smoke or have quit within the past 15 years.  Fecal occult blood test (FOBT) of the stool. You may have this test every year starting at age 7.  Flexible sigmoidoscopy or colonoscopy. You may have a sigmoidoscopy every 5 years or a colonoscopy every 10 years starting at age 103.  Prostate cancer screening. Recommendations will vary depending on your family history and other risks.  Hepatitis C blood test.  Hepatitis B blood test.  Sexually transmitted disease (STD) testing.  Diabetes screening. This is done by checking your blood sugar (glucose) after you have not eaten for a while (fasting). You may have this done every 1-3 years.  Abdominal aortic aneurysm (AAA) screening. You may need this if you are a current or former smoker.  Osteoporosis. You may be screened starting at age 70 if you are at high risk. Talk with your health care provider about your test results, treatment options, and if necessary, the need for more tests. Vaccines  Your health care provider may recommend certain vaccines, such as:  Influenza vaccine. This is recommended every year.  Tetanus, diphtheria, and acellular pertussis (  Tdap, Td) vaccine. You may need a Td booster every 10 years.  Zoster vaccine. You may need this after age 13.  Pneumococcal 13-valent conjugate (PCV13) vaccine. One dose is recommended after age 62.  Pneumococcal polysaccharide (PPSV23) vaccine. One  dose is recommended after age 76. Talk to your health care provider about which screenings and vaccines you need and how often you need them. This information is not intended to replace advice given to you by your health care provider. Make sure you discuss any questions you have with your health care provider. Document Released: 05/16/2015 Document Revised: 01/07/2016 Document Reviewed: 02/18/2015 Elsevier Interactive Patient Education  2017 [place] Prevention in the Home Falls can cause injuries. They can happen to people of all ages. There are many things you can do to make your home safe and to help prevent falls. What can I do on the outside of my home?  Regularly fix the edges of walkways and driveways and fix any cracks.  Remove anything that might make you trip as you walk through a door, such as a raised step or threshold.  Trim any bushes or trees on the path to your home.  Use bright outdoor lighting.  Clear any walking paths of anything that might make someone trip, such as rocks or tools.  Regularly check to see if handrails are loose or broken. Make sure that both sides of any steps have handrails.  Any raised decks and porches should have guardrails on the edges.  Have any leaves, snow, or ice cleared regularly.  Use sand or salt on walking paths during winter.  Clean up any spills in your garage right away. This includes oil or grease spills. What can I do in the bathroom?  Use night lights.  Install grab bars by the toilet and in the tub and shower. Do not use towel bars as grab bars.  Use non-skid mats or decals in the tub or shower.  If you need to sit down in the shower, use a plastic, non-slip stool.  Keep the floor dry. Clean up any water that spills on the floor as soon as it happens.  Remove soap buildup in the tub or shower regularly.  Attach bath mats securely with double-sided non-slip rug tape.  Do not have throw rugs and other  things on the floor that can make you trip. What can I do in the bedroom?  Use night lights.  Make sure that you have a light by your bed that is easy to reach.  Do not use any sheets or blankets that are too big for your bed. They should not hang down onto the floor.  Have a firm chair that has side arms. You can use this for support while you get dressed.  Do not have throw rugs and other things on the floor that can make you trip. What can I do in the kitchen?  Clean up any spills right away.  Avoid walking on wet floors.  Keep items that you use a lot in easy-to-reach places.  If you need to reach something above you, use a strong step stool that has a grab bar.  Keep electrical cords out of the way.  Do not use floor polish or wax that makes floors slippery. If you must use wax, use non-skid floor wax.  Do not have throw rugs and other things on the floor that can make you trip. What can I do with my stairs?  Do  not leave any items on the stairs.  Make sure that there are handrails on both sides of the stairs and use them. Fix handrails that are broken or loose. Make sure that handrails are as long as the stairways.  Check any carpeting to make sure that it is firmly attached to the stairs. Fix any carpet that is loose or worn.  Avoid having throw rugs at the top or bottom of the stairs. If you do have throw rugs, attach them to the floor with carpet tape.  Make sure that you have a light switch at the top of the stairs and the bottom of the stairs. If you do not have them, ask someone to add them for you. What else can I do to help prevent falls?  Wear shoes that:  Do not have high heels.  Have rubber bottoms.  Are comfortable and fit you well.  Are closed at the toe. Do not wear sandals.  If you use a stepladder:  Make sure that it is fully opened. Do not climb a closed stepladder.  Make sure that both sides of the stepladder are locked into place.  Ask  someone to hold it for you, if possible.  Clearly mark and make sure that you can see:  Any grab bars or handrails.  First and last steps.  Where the edge of each step is.  Use tools that help you move around (mobility aids) if they are needed. These include:  Canes.  Walkers.  Scooters.  Crutches.  Turn on the lights when you go into a dark area. Replace any light bulbs as soon as they burn out.  Set up your furniture so you have a clear path. Avoid moving your furniture around.  If any of your floors are uneven, fix them.  If there are any pets around you, be aware of where they are.  Review your medicines with your doctor. Some medicines can make you feel dizzy. This can increase your chance of falling. Ask your doctor what other things that you can do to help prevent falls. This information is not intended to replace advice given to you by your health care provider. Make sure you discuss any questions you have with your health care provider. Document Released: 02/13/2009 Document Revised: 09/25/2015 Document Reviewed: 05/24/2014 Elsevier Interactive Patient Education  2017 [employer].

## 2017-01-10 NOTE — Progress Notes (Signed)
Subjective:   [name] is a 82 y.o. male who presents for an Initial Medicare Annual Wellness Visit at [facility]   Objective:    Today's Vitals   01/10/17 1129 01/10/17 1131  BP: 120/70   Pulse: 91   Temp: 98 F (36.7 C)   TempSrc: Oral   SpO2: 96%   Weight: 183 lb (83 kg)   Height: 5\' 9"  (1.753 m)   PainSc:  2    Body mass index is 27.02 kg/m.  Current Medications (verified) Outpatient Encounter Prescriptions as of 01/10/2017  Medication Sig  . amLODipine (NORVASC) 2.5 MG tablet Take 5 mg by mouth daily.   . Calcium Carbonate-Vitamin D (OSCAL 500/200 D-3 PO) 1 Tablet once a day by mouth . Can't be given with iron  . clopidogrel (PLAVIX) 75 MG tablet Take 75 mg by mouth daily.  Marland Kitchen docusate sodium (COLACE) 100 MG capsule Take 100 mg by mouth 2 (two) times daily.  . ferrous sulfate (QC FERROUS SULFATE) 325 (65 FE) MG tablet Take 325 mg by mouth 2 (two) times daily with a meal.  . labetalol (NORMODYNE) 200 MG tablet Take 200 mg by mouth 3 (three) times daily. Reported on 10/22/2015  . linaclotide (LINZESS) 72 MCG capsule Take 72 mcg by mouth daily before breakfast.  . lisinopril (PRINIVIL,ZESTRIL) 40 MG tablet Take 40 mg by mouth daily.  . metFORMIN (GLUCOPHAGE) 500 MG tablet Take 500 mg by mouth 2 (two) times daily with a meal.   . Multiple Vitamin (MULTIVITAMIN) tablet Take 1 tablet by mouth daily.  Marland Kitchen olopatadine (PATANOL) 0.1 % ophthalmic solution Place 1 drop into the right eye 2 (two) times daily.  . Omega-3 Fatty Acids (FISH OIL) 1000 MG CAPS Take 1,000 mg by mouth 3 (three) times daily.  . pantoprazole (PROTONIX) 40 MG tablet Take 40 mg by mouth daily.  . pravastatin (PRAVACHOL) 80 MG tablet Take 80 mg by mouth daily.  Marland Kitchen senna (SENOKOT) 8.6 MG TABS tablet Take 2 tablets (17.2 mg total) by mouth daily.  . sodium chloride (OCEAN) 0.65 % SOLN nasal spray Place 1 spray into both nostrils 2 (two) times daily.  . tamsulosin (FLOMAX) 0.4 MG CAPS  capsule Take 0.4 mg by mouth daily after supper.   No facility-administered encounter medications on file as of 01/10/2017.     Allergies (verified) Patient has no known allergies.   History: Past Medical History:  Diagnosis Date  . Anemia due to chronic blood loss   . CAD (coronary artery disease)    cath 11/2007: 70% LM ISR, SVG-LAD OK, SVG-D1 100%, IMA-D2 OK, SVG-OM OK, 4-vessel CABG 2000.  Marland Kitchen CAD (coronary artery disease)    s/p of 95% L main artery stenosis, 4/03: cutting balloon PTCA of 80% in-stent restenosis, 7/04. preserved L ventriculr function  . Carotid bruit    bilateral. no sigificant distal abd atherosclerosis by recent cath.   . Chronic bronchitis ([place])   . Constipation   . COPD (chronic obstructive pulmonary disease) (HCC)    ongoing tobacco   . CVA (cerebral infarction)   . DM2 (diabetes mellitus, type 2) ([place])   . Dyslipidemia   . Dysphagia   . Glaucoma   . HTN (hypertension)    Past Surgical History:  Procedure Laterality Date  . CARDIAC CATHETERIZATION    . COLONOSCOPY N/A 05/14/2015   POOR PREP  . COLONOSCOPY N/A 06/02/2015   SLF: 1. four colorectal polyps removed. no source for anemia  identified. 2. the left colon is redundant 3. moderate sized internal hemorroids.   . ESOPHAGOGASTRODUODENOSCOPY N/A 05/14/2015   CHRONIC GASTRITIS  . GIVENS CAPSULE STUDY N/A 06/17/2015   Procedure: GIVENS CAPSULE STUDY;  Surgeon: Danie Binder, MD;  [facility];  Service: Endoscopy;  Laterality: N/A;  0800   Family History  Problem Relation Age of Onset  . Hypertension Mother   . Stroke Maternal Grandmother   . Cancer Neg Hx   . Diabetes Neg Hx   . Heart disease Neg Hx    Social History   Occupational History  . Not on file.   Social History Main Topics  . Smoking status: Former Smoker    Quit date: 08/03/2014  . Smokeless tobacco: Never Used     Comment: 50 pack year hx ; not smoking @ [facility]  . Alcohol use No     Comment: has not had alcohol in  30-40 years   . Drug use: No  . Sexual activity: Not on file   Tobacco Counseling Counseling given: Not Answered   Activities of Daily Living In your present state of health, do you have any difficulty performing the following activities: 01/10/2017  Hearing? Y  Vision? Y  Difficulty concentrating or making decisions? N  Walking or climbing stairs? Y  Dressing or bathing? Y  Doing errands, shopping? Y  Preparing Food and eating ? Y  Using the Toilet? N  In the past six months, have you accidently leaked urine? N  Do you have problems with loss of bowel control? N  Managing your Medications? Y  Managing your Finances? Y  Housekeeping or managing your Housekeeping? Y  Some recent data might be hidden    Immunizations and Health Maintenance Immunization History  Administered Date(s) Administered  . Influenza,inj,Quad PF,6+ Mos 03/07/2015  . Influenza-Unspecified 02/03/2016  . PPD Test 03/10/2015, 03/24/2015  . Pneumococcal-Unspecified 02/10/2016   There are no preventive care reminders to display for this patient.  Patient Care Team: [name], MD as PCP - General (Internal Medicine) Danie Binder, MD as Consulting Physician (Gastroenterology)  Indicate any recent Medical Services you may have received from other than [facility] providers in the past year (date may be approximate).    Assessment:   This is a routine wellness examination for [name].   Hearing/Vision screen No exam data present  Dietary issues and exercise activities discussed: Current Exercise Habits: The patient does not participate in regular exercise at present, Exercise limited by: orthopedic condition(s)  Goals    None     Depression Screen PHQ 2/9 Scores 01/10/2017  PHQ - 2 Score 0    Fall Risk Fall Risk  01/10/2017  Falls in the past year? No    Cognitive Function:     6CIT Screen 01/10/2017  What Year? 0 points  What month? 0 points  What time? 0 points  Count back from 20 0  points  Months in reverse 0 points  Repeat phrase 6 points  Total Score 6    Screening Tests Health Maintenance  Topic Date Due  . INFLUENZA VACCINE  04/02/2017 (Originally 12/01/2016)  . TETANUS/TDAP  09/11/2025 (Originally 08/31/1953)  . OPHTHALMOLOGY EXAM  02/09/2017  . PNA vac Low Risk Adult (2 of 2 - PCV13) 02/09/2017  . HEMOGLOBIN A1C  06/09/2017  . URINE MICROALBUMIN  06/25/2017  . FOOT EXAM  09/07/2017        Plan:    I have personally reviewed and addressed  the Medicare Annual Wellness questionnaire and have noted the following in the patient's chart:  A. Medical and social history B. Use of alcohol, tobacco or illicit drugs  C. Current medications and supplements D. Functional ability and status E.  Nutritional status F.  Physical activity G. Advance directives H. List of other physicians I.  Hospitalizations, surgeries, and ER visits in previous 12 months J.  [place] to include hearing, vision, cognitive, depression L. Referrals and appointments - none  In addition, I have reviewed and discussed with patient certain preventive protocols, quality metrics, and best practice recommendations. A written personalized care plan for preventive services as well as general preventive health recommendations were provided to patient.  See attached scanned questionnaire for additional information.   Signed,   Rich Reining, RN Nurse Health Advisor   Quick Notes   Health Maintenance: TDAP due. Pt will get flu vaccine when facility gets it in stock.     Abnormal Screen:     Patient Concerns: none     Nurse Concerns: none

## 2017-01-11 DIAGNOSIS — H1131 Conjunctival hemorrhage, right eye: Secondary | ICD-10-CM | POA: Diagnosis not present

## 2017-01-11 DIAGNOSIS — R279 Unspecified lack of coordination: Secondary | ICD-10-CM | POA: Diagnosis not present

## 2017-01-11 DIAGNOSIS — H409 Unspecified glaucoma: Secondary | ICD-10-CM | POA: Diagnosis not present

## 2017-01-11 DIAGNOSIS — I639 Cerebral infarction, unspecified: Secondary | ICD-10-CM | POA: Diagnosis not present

## 2017-01-11 DIAGNOSIS — M6281 Muscle weakness (generalized): Secondary | ICD-10-CM | POA: Diagnosis not present

## 2017-01-12 DIAGNOSIS — I639 Cerebral infarction, unspecified: Secondary | ICD-10-CM | POA: Diagnosis not present

## 2017-01-12 DIAGNOSIS — R279 Unspecified lack of coordination: Secondary | ICD-10-CM | POA: Diagnosis not present

## 2017-01-12 DIAGNOSIS — H409 Unspecified glaucoma: Secondary | ICD-10-CM | POA: Diagnosis not present

## 2017-01-12 DIAGNOSIS — M6281 Muscle weakness (generalized): Secondary | ICD-10-CM | POA: Diagnosis not present

## 2017-01-13 ENCOUNTER — Non-Acute Institutional Stay (SKILLED_NURSING_FACILITY): Payer: Medicare Other | Admitting: Internal Medicine

## 2017-01-13 ENCOUNTER — Encounter: Payer: Self-pay | Admitting: Internal Medicine

## 2017-01-13 DIAGNOSIS — D500 Iron deficiency anemia secondary to blood loss (chronic): Secondary | ICD-10-CM

## 2017-01-13 DIAGNOSIS — H409 Unspecified glaucoma: Secondary | ICD-10-CM | POA: Diagnosis not present

## 2017-01-13 DIAGNOSIS — E1151 Type 2 diabetes mellitus with diabetic peripheral angiopathy without gangrene: Secondary | ICD-10-CM | POA: Diagnosis not present

## 2017-01-13 DIAGNOSIS — K5900 Constipation, unspecified: Secondary | ICD-10-CM

## 2017-01-13 DIAGNOSIS — I10 Essential (primary) hypertension: Secondary | ICD-10-CM

## 2017-01-13 DIAGNOSIS — R279 Unspecified lack of coordination: Secondary | ICD-10-CM | POA: Diagnosis not present

## 2017-01-13 DIAGNOSIS — E785 Hyperlipidemia, unspecified: Secondary | ICD-10-CM | POA: Diagnosis not present

## 2017-01-13 DIAGNOSIS — I2510 Atherosclerotic heart disease of native coronary artery without angina pectoris: Secondary | ICD-10-CM | POA: Diagnosis not present

## 2017-01-13 DIAGNOSIS — I639 Cerebral infarction, unspecified: Secondary | ICD-10-CM | POA: Diagnosis not present

## 2017-01-13 DIAGNOSIS — M6281 Muscle weakness (generalized): Secondary | ICD-10-CM | POA: Diagnosis not present

## 2017-01-13 NOTE — Progress Notes (Signed)
Location:    [place] Room Number: 104/W Place of Service:  SNF [phone]) Provider:  Kyung Rudd, Rene Kocher, MD  Patient Care Team: [name], MD as PCP - General (Internal Medicine) Danie Binder, MD as Consulting Physician (Gastroenterology)  Extended Emergency Contact Information Primary Emergency Contact: Dorathy Kinsman [place] of [place] Phone: [phone] Relation: Daughter Secondary Emergency Contact: [place] of [place] Phone: [phone] Relation: Daughter  Code Status:  DNR Goals of care: Advanced Directive information Advanced Directives 01/13/2017  Does Patient Have a Medical Advance Directive? Yes  Type of Advance Directive Out of facility DNR (pink MOST or yellow form)  Does patient want to make changes to medical advance directive? No - Patient declined  Copy of [place] in Chart? -  Would patient like information on creating a medical advance directive? -  Pre-existing out of facility DNR order (yellow form or pink MOST form) -     Chief Complaint  Patient presents with  . Medical Management of Chronic Issues    Routine Visit    HPI:  Pt is a 82 y.o. male seen today for medical management of chronic diseases.    Patient has H/O GI bleed, Diabetes type 2 ,S/P CVA , Hypertension, Hyperlipidemia, constipation. Poor vision . CAD S/P CABG in 2000 and PCI of Left Main on 2003 and Carotid artery stenosis.with complete occlusion of RCA.  Patient is doing well in facility. He did not have any complains today and per nurses he has no new issues. His weigh is down to 180 lbs from 184.He says he is cutting back on his snacks. He was recently seen by ophthalmologist for Red right eye and they recommended prednisone drps for irritation.He is blind and cannot see from either eye. He also was seen by gastro with no new changes.  Past Medical History:  Diagnosis Date  .  Anemia due to chronic blood loss   . CAD (coronary artery disease)    cath 11/2007: 70% LM ISR, SVG-LAD OK, SVG-D1 100%, IMA-D2 OK, SVG-OM OK, 4-vessel CABG 2000.  Marland Kitchen CAD (coronary artery disease)    s/p of 95% L main artery stenosis, 4/03: cutting balloon PTCA of 80% in-stent restenosis, 7/04. preserved L ventriculr function  . Carotid bruit    bilateral. no sigificant distal abd atherosclerosis by recent cath.   . Chronic bronchitis ([place])   . Constipation   . COPD (chronic obstructive pulmonary disease) (HCC)    ongoing tobacco   . CVA (cerebral infarction)   . DM2 (diabetes mellitus, type 2) ([place])   . Dyslipidemia   . Dysphagia   . Glaucoma   . HTN (hypertension)    Past Surgical History:  Procedure Laterality Date  . CARDIAC CATHETERIZATION    . COLONOSCOPY N/A 05/14/2015   POOR PREP  . COLONOSCOPY N/A 06/02/2015   SLF: 1. four colorectal polyps removed. no source for anemia identified. 2. the left colon is redundant 3. moderate sized internal hemorroids.   . ESOPHAGOGASTRODUODENOSCOPY N/A 05/14/2015   CHRONIC GASTRITIS  . GIVENS CAPSULE STUDY N/A 06/17/2015   Procedure: GIVENS CAPSULE STUDY;  Surgeon: Danie Binder, MD;  [facility];  Service: Endoscopy;  Laterality: N/A;  0800    No Known Allergies  Outpatient Encounter Prescriptions as of 01/13/2017  Medication Sig  . amLODipine (NORVASC) 2.5 MG tablet Take 5 mg by mouth daily.   . Calcium Carbonate-Vitamin D (OSCAL 500/200  D-3 PO) 1 Tablet once a day by mouth . Can't be given with iron  . clopidogrel (PLAVIX) 75 MG tablet Take 75 mg by mouth daily.  Marland Kitchen docusate sodium (COLACE) 100 MG capsule Take 100 mg by mouth 2 (two) times daily.  . ferrous sulfate (QC FERROUS SULFATE) 325 (65 FE) MG tablet Take 325 mg by mouth 2 (two) times daily with a meal.  . labetalol (NORMODYNE) 200 MG tablet Take 200 mg by mouth 3 (three) times daily. Reported on 10/22/2015  . linaclotide (LINZESS) 72 MCG capsule Take 72 mcg by mouth  daily before breakfast.  . lisinopril (PRINIVIL,ZESTRIL) 40 MG tablet Take 40 mg by mouth daily.  . metFORMIN (GLUCOPHAGE) 500 MG tablet Take 500 mg by mouth 2 (two) times daily with a meal.   . Multiple Vitamin (MULTIVITAMIN) tablet Take 1 tablet by mouth daily.  . Omega-3 Fatty Acids (FISH OIL) 1000 MG CAPS Take 1,000 mg by mouth 3 (three) times daily.  . pantoprazole (PROTONIX) 40 MG tablet Take 40 mg by mouth daily.  . pravastatin (PRAVACHOL) 80 MG tablet Take 80 mg by mouth daily.  . prednisoLONE acetate (PRED FORTE) 1 % ophthalmic suspension Place 1 drop into the right eye 3 (three) times daily.  Marland Kitchen senna (SENOKOT) 8.6 MG TABS tablet Take 2 tablets (17.2 mg total) by mouth daily.  . sodium chloride (OCEAN) 0.65 % SOLN nasal spray Place 1 spray into both nostrils 2 (two) times daily.  . tamsulosin (FLOMAX) 0.4 MG CAPS capsule Take 0.4 mg by mouth daily after supper.  . [DISCONTINUED] olopatadine (PATANOL) 0.1 % ophthalmic solution Place 1 drop into the right eye 2 (two) times daily.   No facility-administered encounter medications on file as of 01/13/2017.      Review of Systems  Review of Systems  Constitutional: Negative for activity change, appetite change, chills, diaphoresis, fatigue and fever.  HENT: Negative for mouth sores, postnasal drip, rhinorrhea, sinus pain and sore throat.   Respiratory: Negative for apnea, cough, chest tightness, shortness of breath and wheezing.   Cardiovascular: Negative for chest pain, palpitations and leg swelling.  Gastrointestinal: Negative for abdominal distention, abdominal pain, constipation, diarrhea, nausea and vomiting.  Genitourinary: Negative for dysuria and frequency.  Musculoskeletal: Negative for arthralgias, joint swelling and myalgias.  Skin: Negative for rash.  Neurological: Negative for dizziness, syncope, weakness, light-headedness and numbness.  Psychiatric/Behavioral: Negative for behavioral problems, confusion and sleep  disturbance.     Immunization History  Administered Date(s) Administered  . Influenza,inj,Quad PF,6+ Mos 03/07/2015  . Influenza-Unspecified 02/03/2016  . PPD Test 03/10/2015, 03/24/2015  . Pneumococcal-Unspecified 02/10/2016   Pertinent  Health Maintenance Due  Topic Date Due  . INFLUENZA VACCINE  04/02/2017 (Originally 12/01/2016)  . OPHTHALMOLOGY EXAM  02/09/2017  . PNA vac Low Risk Adult (2 of 2 - PCV13) 02/09/2017  . HEMOGLOBIN A1C  06/09/2017  . URINE MICROALBUMIN  06/25/2017  . FOOT EXAM  09/07/2017   Fall Risk  01/10/2017  Falls in the past year? No   Functional Status Survey:    Vitals:   01/13/17 1009  BP: 118/64  Pulse: 62  Resp: 18  Temp: (!) 97.5 F (36.4 C)  TempSrc: Oral  SpO2: 100%   There is no height or weight on file to calculate BMI. Physical Exam  Constitutional: He appears well-developed and well-nourished.  HENT:  Head: Normocephalic.  Mouth/Throat: Oropharynx is clear and moist.  Eyes:  Continuous  Right red eye  Neck: Neck supple.  Cardiovascular:  Normal rate and normal heart sounds.   Pulmonary/Chest: Effort normal and breath sounds normal. No respiratory distress. He has no wheezes.  Abdominal: Soft. Bowel sounds are normal. He exhibits no distension. There is no tenderness. There is no rebound.  Musculoskeletal:  Trace edema Bilateral Right more then Left  Neurological: He is alert.  No focal deficits  Skin: Skin is warm and dry.  Psychiatric: He has a normal mood and affect. His behavior is normal.    Labs reviewed:  Recent Labs  08/31/16 0700 10/23/16 0420 12/07/16 0700  NA 138 142 139  K 3.9 4.5 4.0  CL 105 109 106  CO2 26 22 25   GLUCOSE 120* 106* 79  BUN 17 18 15   CREATININE 0.87 0.99 0.92  CALCIUM 9.2 9.6 9.1    Recent Labs  06/25/16 0700 07/26/16 0700 08/16/16 1430  AST  --  17 21  ALT  --  13* 18  ALKPHOS  --  53 54  BILITOT  --  0.5 0.2*  PROT  --  6.9 6.8  ALBUMIN 3.9 3.4* 3.4*    Recent Labs   08/16/16 1430 08/31/16 0700 10/23/16 0420 12/07/16 0700  WBC 10.2 8.6 7.1 8.0  NEUTROABS 7.1 5.3 4.0  --   HGB 11.4* 12.0* 12.0* 11.9*  HCT 33.3* 36.9* 36.0* 35.8*  MCV 84.9 86.6 84.9 85.6  PLT 276 357 260 289   Lab Results  Component Value Date   TSH 1.064 10/31/2015   Lab Results  Component Value Date   HGBA1C 6.6 (H) 12/07/2016   Lab Results  Component Value Date   CHOL 126 12/07/2016   HDL 38 (L) 12/07/2016   LDLCALC 68 12/07/2016   TRIG 98 12/07/2016   CHOLHDL 3.3 12/07/2016    Significant Diagnostic Results in last 30 days:  No results found.  Assessment/Plan  Essential hypertension BP Well controlled on Norvasc and labetalol and Lisinopril  DM (diabetes mellitus) with peripheral vascular complication (HCC) Aic 6.6 in 08/18 BS running less then 150 Continue on Glucophage  CAD Stable on Plavix , beta blocker and statin  Constipation,  On Linzess and senna  Iron deficiency anemia due to chronic blood loss HGB stable on Iron  Hyperlipidemia,  LDL is 68 on Statin  s/p CVA Continue Plavix Eye congestion Possible irritation follows with Opthalmologist Family/ staff Communication:  Labs/tests ordered:    Total time spent in this patient care encounter was 25_ minutes; greater than 50% of the visit spent counseling patient, reviewing records , Labs and coordinating care for problems addressed at this encounter.

## 2017-01-14 DIAGNOSIS — H409 Unspecified glaucoma: Secondary | ICD-10-CM | POA: Diagnosis not present

## 2017-01-14 DIAGNOSIS — I639 Cerebral infarction, unspecified: Secondary | ICD-10-CM | POA: Diagnosis not present

## 2017-01-14 DIAGNOSIS — M6281 Muscle weakness (generalized): Secondary | ICD-10-CM | POA: Diagnosis not present

## 2017-01-14 DIAGNOSIS — R279 Unspecified lack of coordination: Secondary | ICD-10-CM | POA: Diagnosis not present

## 2017-01-17 DIAGNOSIS — M6281 Muscle weakness (generalized): Secondary | ICD-10-CM | POA: Diagnosis not present

## 2017-01-17 DIAGNOSIS — H409 Unspecified glaucoma: Secondary | ICD-10-CM | POA: Diagnosis not present

## 2017-01-17 DIAGNOSIS — I639 Cerebral infarction, unspecified: Secondary | ICD-10-CM | POA: Diagnosis not present

## 2017-01-17 DIAGNOSIS — R279 Unspecified lack of coordination: Secondary | ICD-10-CM | POA: Diagnosis not present

## 2017-01-17 NOTE — Progress Notes (Signed)
This encounter was created in error - please disregard.

## 2017-01-18 DIAGNOSIS — M6281 Muscle weakness (generalized): Secondary | ICD-10-CM | POA: Diagnosis not present

## 2017-01-18 DIAGNOSIS — I639 Cerebral infarction, unspecified: Secondary | ICD-10-CM | POA: Diagnosis not present

## 2017-01-18 DIAGNOSIS — R279 Unspecified lack of coordination: Secondary | ICD-10-CM | POA: Diagnosis not present

## 2017-01-18 DIAGNOSIS — H409 Unspecified glaucoma: Secondary | ICD-10-CM | POA: Diagnosis not present

## 2017-01-19 DIAGNOSIS — R279 Unspecified lack of coordination: Secondary | ICD-10-CM | POA: Diagnosis not present

## 2017-01-19 DIAGNOSIS — I639 Cerebral infarction, unspecified: Secondary | ICD-10-CM | POA: Diagnosis not present

## 2017-01-19 DIAGNOSIS — H409 Unspecified glaucoma: Secondary | ICD-10-CM | POA: Diagnosis not present

## 2017-01-19 DIAGNOSIS — M6281 Muscle weakness (generalized): Secondary | ICD-10-CM | POA: Diagnosis not present

## 2017-01-20 DIAGNOSIS — H409 Unspecified glaucoma: Secondary | ICD-10-CM | POA: Diagnosis not present

## 2017-01-20 DIAGNOSIS — M6281 Muscle weakness (generalized): Secondary | ICD-10-CM | POA: Diagnosis not present

## 2017-01-20 DIAGNOSIS — I639 Cerebral infarction, unspecified: Secondary | ICD-10-CM | POA: Diagnosis not present

## 2017-01-20 DIAGNOSIS — R279 Unspecified lack of coordination: Secondary | ICD-10-CM | POA: Diagnosis not present

## 2017-01-21 DIAGNOSIS — M6281 Muscle weakness (generalized): Secondary | ICD-10-CM | POA: Diagnosis not present

## 2017-01-21 DIAGNOSIS — H409 Unspecified glaucoma: Secondary | ICD-10-CM | POA: Diagnosis not present

## 2017-01-21 DIAGNOSIS — R279 Unspecified lack of coordination: Secondary | ICD-10-CM | POA: Diagnosis not present

## 2017-01-21 DIAGNOSIS — I639 Cerebral infarction, unspecified: Secondary | ICD-10-CM | POA: Diagnosis not present

## 2017-01-23 DIAGNOSIS — R279 Unspecified lack of coordination: Secondary | ICD-10-CM | POA: Diagnosis not present

## 2017-01-23 DIAGNOSIS — H409 Unspecified glaucoma: Secondary | ICD-10-CM | POA: Diagnosis not present

## 2017-01-23 DIAGNOSIS — M6281 Muscle weakness (generalized): Secondary | ICD-10-CM | POA: Diagnosis not present

## 2017-01-23 DIAGNOSIS — I639 Cerebral infarction, unspecified: Secondary | ICD-10-CM | POA: Diagnosis not present

## 2017-01-24 DIAGNOSIS — H409 Unspecified glaucoma: Secondary | ICD-10-CM | POA: Diagnosis not present

## 2017-01-24 DIAGNOSIS — M6281 Muscle weakness (generalized): Secondary | ICD-10-CM | POA: Diagnosis not present

## 2017-01-24 DIAGNOSIS — I639 Cerebral infarction, unspecified: Secondary | ICD-10-CM | POA: Diagnosis not present

## 2017-01-24 DIAGNOSIS — R279 Unspecified lack of coordination: Secondary | ICD-10-CM | POA: Diagnosis not present

## 2017-01-25 DIAGNOSIS — R279 Unspecified lack of coordination: Secondary | ICD-10-CM | POA: Diagnosis not present

## 2017-01-25 DIAGNOSIS — H409 Unspecified glaucoma: Secondary | ICD-10-CM | POA: Diagnosis not present

## 2017-01-25 DIAGNOSIS — I639 Cerebral infarction, unspecified: Secondary | ICD-10-CM | POA: Diagnosis not present

## 2017-01-25 DIAGNOSIS — M6281 Muscle weakness (generalized): Secondary | ICD-10-CM | POA: Diagnosis not present

## 2017-01-26 ENCOUNTER — Encounter (HOSPITAL_COMMUNITY)
Admission: RE | Admit: 2017-01-26 | Discharge: 2017-01-26 | Disposition: A | Discharge status: Home / Self Care | Payer: Medicare Other | Source: Skilled Nursing Facility | Attending: Internal Medicine | Admitting: Internal Medicine

## 2017-01-26 DIAGNOSIS — I10 Essential (primary) hypertension: Secondary | ICD-10-CM | POA: Insufficient documentation

## 2017-01-26 DIAGNOSIS — Z7984 Long term (current) use of oral hypoglycemic drugs: Secondary | ICD-10-CM | POA: Diagnosis not present

## 2017-01-26 DIAGNOSIS — B351 Tinea unguium: Secondary | ICD-10-CM | POA: Diagnosis not present

## 2017-01-26 DIAGNOSIS — E1151 Type 2 diabetes mellitus with diabetic peripheral angiopathy without gangrene: Secondary | ICD-10-CM | POA: Diagnosis not present

## 2017-01-26 LAB — VITAMIN B12: VITAMIN B 12: 715 pg/mL (ref 180–914)

## 2017-01-27 DIAGNOSIS — R279 Unspecified lack of coordination: Secondary | ICD-10-CM | POA: Diagnosis not present

## 2017-01-27 DIAGNOSIS — H409 Unspecified glaucoma: Secondary | ICD-10-CM | POA: Diagnosis not present

## 2017-01-27 DIAGNOSIS — I639 Cerebral infarction, unspecified: Secondary | ICD-10-CM | POA: Diagnosis not present

## 2017-01-27 DIAGNOSIS — M6281 Muscle weakness (generalized): Secondary | ICD-10-CM | POA: Diagnosis not present

## 2017-01-31 DIAGNOSIS — I639 Cerebral infarction, unspecified: Secondary | ICD-10-CM | POA: Diagnosis not present

## 2017-01-31 DIAGNOSIS — M6281 Muscle weakness (generalized): Secondary | ICD-10-CM | POA: Diagnosis not present

## 2017-01-31 DIAGNOSIS — R279 Unspecified lack of coordination: Secondary | ICD-10-CM | POA: Diagnosis not present

## 2017-01-31 DIAGNOSIS — H409 Unspecified glaucoma: Secondary | ICD-10-CM | POA: Diagnosis not present

## 2017-02-10 ENCOUNTER — Encounter: Payer: Self-pay | Admitting: Internal Medicine

## 2017-02-10 NOTE — Progress Notes (Signed)
Location:   [place] Room Number: 104/W Place of Service:  SNF (31) Provider:  Gardiner Fanti, Rene Kocher, MD  Patient Care Team: [name], MD as PCP - General (Internal Medicine) Danie Binder, MD as Consulting Physician (Gastroenterology)  Extended Emergency Contact Information Primary Emergency Contact: Dorathy Kinsman [place] of [place] Phone: [phone] Relation: Daughter Secondary Emergency Contact: [place] of [place] Phone: [phone] Relation: Daughter  Code Status:  DNR Goals of care: Advanced Directive information Advanced Directives 02/10/2017  Does Patient Have a Medical Advance Directive? Yes  Type of Advance Directive Out of facility DNR (pink MOST or yellow form)  Does patient want to make changes to medical advance directive? No - Patient declined  Copy of [place] in Chart? No - copy requested  Would patient like information on creating a medical advance directive? -  Pre-existing out of facility DNR order (yellow form or pink MOST form) -     Chief Complaint  Patient presents with  . Medical Management of Chronic Issues    Routine Visit    HPI:  Pt is a 82 y.o. male seen today for medical management of chronic diseases.     Past Medical History:  Diagnosis Date  . Anemia due to chronic blood loss   . CAD (coronary artery disease)    cath 11/2007: 70% LM ISR, SVG-LAD OK, SVG-D1 100%, IMA-D2 OK, SVG-OM OK, 4-vessel CABG 2000.  Marland Kitchen CAD (coronary artery disease)    s/p of 95% L main artery stenosis, 4/03: cutting balloon PTCA of 80% in-stent restenosis, 7/04. preserved L ventriculr function  . Carotid bruit    bilateral. no sigificant distal abd atherosclerosis by recent cath.   . Chronic bronchitis ([place])   . Constipation   . COPD (chronic obstructive pulmonary disease) (HCC)    ongoing tobacco   . CVA (cerebral infarction)   . DM2 (diabetes mellitus,  type 2) ([place])   . Dyslipidemia   . Dysphagia   . Glaucoma   . HTN (hypertension)    Past Surgical History:  Procedure Laterality Date  . CARDIAC CATHETERIZATION    . COLONOSCOPY N/A 05/14/2015   POOR PREP  . COLONOSCOPY N/A 06/02/2015   SLF: 1. four colorectal polyps removed. no source for anemia identified. 2. the left colon is redundant 3. moderate sized internal hemorroids.   . ESOPHAGOGASTRODUODENOSCOPY N/A 05/14/2015   CHRONIC GASTRITIS  . GIVENS CAPSULE STUDY N/A 06/17/2015   Procedure: GIVENS CAPSULE STUDY;  Surgeon: Danie Binder, MD;  [facility];  Service: Endoscopy;  Laterality: N/A;  0800    No Known Allergies  Outpatient Encounter Prescriptions as of 02/10/2017  Medication Sig  . amLODipine (NORVASC) 2.5 MG tablet Take 5 mg by mouth daily.   . Calcium Carbonate-Vitamin D (OSCAL 500/200 D-3 PO) 1 Tablet once a day by mouth . Can't be given with iron  . clopidogrel (PLAVIX) 75 MG tablet Take 75 mg by mouth daily.  Marland Kitchen docusate sodium (COLACE) 100 MG capsule Take 100 mg by mouth 2 (two) times daily.  . ferrous sulfate (QC FERROUS SULFATE) 325 (65 FE) MG tablet Take 325 mg by mouth 2 (two) times daily with a meal.  . labetalol (NORMODYNE) 200 MG tablet Take 200 mg by mouth 3 (three) times daily. Reported on 10/22/2015  . linaclotide (LINZESS) 72 MCG capsule Take 72 mcg by mouth daily before breakfast.  . lisinopril (PRINIVIL,ZESTRIL) 40 MG  tablet Take 40 mg by mouth daily.  . metFORMIN (GLUCOPHAGE) 500 MG tablet Take 500 mg by mouth 2 (two) times daily with a meal.   . Multiple Vitamin (MULTIVITAMIN) tablet Take 1 tablet by mouth daily.  . Omega-3 Fatty Acids (FISH OIL) 1000 MG CAPS Take 1,000 mg by mouth 3 (three) times daily.  . pantoprazole (PROTONIX) 40 MG tablet Take 40 mg by mouth daily.  . pravastatin (PRAVACHOL) 80 MG tablet Take 80 mg by mouth daily.  Marland Kitchen senna (SENOKOT) 8.6 MG TABS tablet Take 2 tablets (17.2 mg total) by mouth daily.  . sodium chloride  (OCEAN) 0.65 % SOLN nasal spray Place 1 spray into both nostrils 2 (two) times daily.  . tamsulosin (FLOMAX) 0.4 MG CAPS capsule Take 0.4 mg by mouth daily after supper.   No facility-administered encounter medications on file as of 02/10/2017.      Review of Systems  Immunization History  Administered Date(s) Administered  . Influenza,inj,Quad PF,6+ Mos 03/07/2015  . Influenza-Unspecified 02/03/2016  . PPD Test 03/10/2015, 03/24/2015  . Pneumococcal-Unspecified 02/10/2016   Pertinent  Health Maintenance Due  Topic Date Due  . OPHTHALMOLOGY EXAM  02/09/2017  . PNA vac Low Risk Adult (2 of 2 - PCV13) 02/09/2017  . INFLUENZA VACCINE  04/02/2017 (Originally 12/01/2016)  . HEMOGLOBIN A1C  06/09/2017  . URINE MICROALBUMIN  06/25/2017  . FOOT EXAM  01/26/2018   Fall Risk  01/10/2017  Falls in the past year? No   Functional Status Survey:    Vitals:   02/10/17 1541  BP: (!) 141/64  Pulse: 70  Resp: 14  Temp: (!) 96.9 F (36.1 C)  TempSrc: Oral  SpO2: 97%   There is no height or weight on file to calculate BMI. Physical Exam  Labs reviewed:  Recent Labs  08/31/16 0700 10/23/16 0420 12/07/16 0700  NA 138 142 139  K 3.9 4.5 4.0  CL 105 109 106  CO2 26 22 25   GLUCOSE 120* 106* 79  BUN 17 18 15   CREATININE 0.87 0.99 0.92  CALCIUM 9.2 9.6 9.1    Recent Labs  06/25/16 0700 07/26/16 0700 08/16/16 1430  AST  --  17 21  ALT  --  13* 18  ALKPHOS  --  53 54  BILITOT  --  0.5 0.2*  PROT  --  6.9 6.8  ALBUMIN 3.9 3.4* 3.4*    Recent Labs  08/16/16 1430 08/31/16 0700 10/23/16 0420 12/07/16 0700  WBC 10.2 8.6 7.1 8.0  NEUTROABS 7.1 5.3 4.0  --   HGB 11.4* 12.0* 12.0* 11.9*  HCT 33.3* 36.9* 36.0* 35.8*  MCV 84.9 86.6 84.9 85.6  PLT 276 357 260 289   Lab Results  Component Value Date   TSH 1.064 10/31/2015   Lab Results  Component Value Date   HGBA1C 6.6 (H) 12/07/2016   Lab Results  Component Value Date   CHOL 126 12/07/2016   HDL 38 (L)  12/07/2016   LDLCALC 68 12/07/2016   TRIG 98 12/07/2016   CHOLHDL 3.3 12/07/2016    Significant Diagnostic Results in last 30 days:  No results found.  Assessment/Plan There are no diagnoses linked to this encounter.   Family/ staff Communication:   Labs/tests ordered:       This encounter was created in error - please disregard.

## 2017-02-11 ENCOUNTER — Encounter: Payer: Self-pay | Admitting: Internal Medicine

## 2017-02-11 NOTE — Progress Notes (Signed)
Location:   [facility] Room Number: 104/W Place of Service:  SNF (31) Provider:  Gardiner Fanti, Rene Kocher, MD  Patient Care Team: [name], MD as PCP - General (Internal Medicine) Danie Binder, MD as Consulting Physician (Gastroenterology)  Extended Emergency Contact Information Primary Emergency Contact: Dorathy Kinsman States of [place] Phone: [phone] Relation: Daughter Secondary Emergency Contact: [place] Phone: [phone] Relation: Daughter  Code Status:  DNR Goals of care: Advanced Directive information Advanced Directives 02/11/2017  Does Patient Have a Medical Advance Directive? Yes  Type of Advance Directive Out of facility DNR (pink MOST or yellow form)  Does patient want to make changes to medical advance directive? No - Patient declined  Copy of [place] in Chart? No - copy requested  Would patient like information on creating a medical advance directive? -  Pre-existing out of facility DNR order (yellow form or pink MOST form) -     Chief Complaint  Patient presents with  . Medical Management of Chronic Issues    Routine Visit, Due DM eye Exam    HPI:  Pt is a 82 y.o. male seen today for medical management of chronic diseases.     Past Medical History:  Diagnosis Date  . Anemia due to chronic blood loss   . CAD (coronary artery disease)    cath 11/2007: 70% LM ISR, SVG-LAD OK, SVG-D1 100%, IMA-D2 OK, SVG-OM OK, 4-vessel CABG 2000.  Marland Kitchen CAD (coronary artery disease)    s/p of 95% L main artery stenosis, 4/03: cutting balloon PTCA of 80% in-stent restenosis, 7/04. preserved L ventriculr function  . Carotid bruit    bilateral. no sigificant distal abd atherosclerosis by recent cath.   . Chronic bronchitis ([place])   . Constipation   . COPD (chronic obstructive pulmonary disease) (HCC)    ongoing tobacco   . CVA (cerebral infarction)   . DM2  (diabetes mellitus, type 2) ([place])   . Dyslipidemia   . Dysphagia   . Glaucoma   . HTN (hypertension)    Past Surgical History:  Procedure Laterality Date  . CARDIAC CATHETERIZATION    . COLONOSCOPY N/A 05/14/2015   POOR PREP  . COLONOSCOPY N/A 06/02/2015   SLF: 1. four colorectal polyps removed. no source for anemia identified. 2. the left colon is redundant 3. moderate sized internal hemorroids.   . ESOPHAGOGASTRODUODENOSCOPY N/A 05/14/2015   CHRONIC GASTRITIS  . GIVENS CAPSULE STUDY N/A 06/17/2015   Procedure: GIVENS CAPSULE STUDY;  Surgeon: Danie Binder, MD;  Location: [facility];  Service: Endoscopy;  Laterality: N/A;  0800    No Known Allergies  Outpatient Encounter Prescriptions as of 02/11/2017  Medication Sig  . amLODipine (NORVASC) 2.5 MG tablet Take 5 mg by mouth daily.   . Calcium Carbonate-Vitamin D (OSCAL 500/200 D-3 PO) 1 Tablet once a day by mouth . Can't be given with iron  . clopidogrel (PLAVIX) 75 MG tablet Take 75 mg by mouth daily.  Marland Kitchen docusate sodium (COLACE) 100 MG capsule Take 100 mg by mouth 2 (two) times daily.  . ferrous sulfate (QC FERROUS SULFATE) 325 (65 FE) MG tablet Take 325 mg by mouth 2 (two) times daily with a meal.  . labetalol (NORMODYNE) 200 MG tablet Take 200 mg by mouth 3 (three) times daily. Reported on 10/22/2015  . linaclotide (LINZESS) 72 MCG capsule Take 72 mcg by mouth daily before breakfast.  .  lisinopril (PRINIVIL,ZESTRIL) 40 MG tablet Take 40 mg by mouth daily.  . metFORMIN (GLUCOPHAGE) 500 MG tablet Take 500 mg by mouth 2 (two) times daily with a meal.   . Multiple Vitamin (MULTIVITAMIN) tablet Take 1 tablet by mouth daily.  . Omega-3 Fatty Acids (FISH OIL) 1000 MG CAPS Take 1,000 mg by mouth 3 (three) times daily.  . pantoprazole (PROTONIX) 40 MG tablet Take 40 mg by mouth daily.  . pravastatin (PRAVACHOL) 80 MG tablet Take 80 mg by mouth daily.  Marland Kitchen senna (SENOKOT) 8.6 MG TABS tablet Take 2 tablets (17.2 mg total) by mouth daily.  .  sodium chloride (OCEAN) 0.65 % SOLN nasal spray Place 1 spray into both nostrils 2 (two) times daily.  . tamsulosin (FLOMAX) 0.4 MG CAPS capsule Take 0.4 mg by mouth daily after supper.   No facility-administered encounter medications on file as of 02/11/2017.      Review of Systems  Immunization History  Administered Date(s) Administered  . Influenza,inj,Quad PF,6+ Mos 03/07/2015  . Influenza-Unspecified 02/03/2016, 02/04/2017  . PPD Test 03/10/2015, 03/24/2015  . Pneumococcal Conjugate-13 02/04/2017  . Pneumococcal-Unspecified 02/10/2016  . Tdap 01/13/2017   Pertinent  Health Maintenance Due  Topic Date Due  . OPHTHALMOLOGY EXAM  04/13/2017 (Originally 02/09/2017)  . HEMOGLOBIN A1C  06/09/2017  . URINE MICROALBUMIN  06/25/2017  . FOOT EXAM  01/26/2018  . INFLUENZA VACCINE  Completed  . PNA vac Low Risk Adult  Completed   Fall Risk  01/10/2017  Falls in the past year? No   Functional Status Survey:    Vitals:   02/11/17 0933  BP: (!) 141/64  Pulse: 70  Resp: 14  Temp: (!) 96.9 F (36.1 C)  TempSrc: Oral  SpO2: 97%   There is no height or weight on file to calculate BMI. Physical Exam  Labs reviewed:  Recent Labs  08/31/16 0700 10/23/16 0420 12/07/16 0700  NA 138 142 139  K 3.9 4.5 4.0  CL 105 109 106  CO2 26 22 25   GLUCOSE 120* 106* 79  BUN 17 18 15   CREATININE 0.87 0.99 0.92  CALCIUM 9.2 9.6 9.1    Recent Labs  06/25/16 0700 07/26/16 0700 08/16/16 1430  AST  --  17 21  ALT  --  13* 18  ALKPHOS  --  53 54  BILITOT  --  0.5 0.2*  PROT  --  6.9 6.8  ALBUMIN 3.9 3.4* 3.4*    Recent Labs  08/16/16 1430 08/31/16 0700 10/23/16 0420 12/07/16 0700  WBC 10.2 8.6 7.1 8.0  NEUTROABS 7.1 5.3 4.0  --   HGB 11.4* 12.0* 12.0* 11.9*  HCT 33.3* 36.9* 36.0* 35.8*  MCV 84.9 86.6 84.9 85.6  PLT 276 357 260 289   Lab Results  Component Value Date   TSH 1.064 10/31/2015   Lab Results  Component Value Date   HGBA1C 6.6 (H) 12/07/2016   Lab  Results  Component Value Date   CHOL 126 12/07/2016   HDL 38 (L) 12/07/2016   LDLCALC 68 12/07/2016   TRIG 98 12/07/2016   CHOLHDL 3.3 12/07/2016    Significant Diagnostic Results in last 30 days:  No results found.  Assessment/Plan There are no diagnoses linked to this encounter.   Family/ staff Communication:   Labs/tests ordered:

## 2017-02-13 NOTE — Progress Notes (Signed)
This encounter was created in error - please disregard.

## 2017-02-14 ENCOUNTER — Non-Acute Institutional Stay (SKILLED_NURSING_FACILITY): Payer: Medicare Other | Admitting: Internal Medicine

## 2017-02-14 ENCOUNTER — Encounter: Payer: Self-pay | Admitting: Internal Medicine

## 2017-02-14 DIAGNOSIS — E1151 Type 2 diabetes mellitus with diabetic peripheral angiopathy without gangrene: Secondary | ICD-10-CM | POA: Diagnosis not present

## 2017-02-14 DIAGNOSIS — D500 Iron deficiency anemia secondary to blood loss (chronic): Secondary | ICD-10-CM

## 2017-02-14 DIAGNOSIS — Z8673 Personal history of transient ischemic attack (TIA), and cerebral infarction without residual deficits: Secondary | ICD-10-CM

## 2017-02-14 DIAGNOSIS — I2510 Atherosclerotic heart disease of native coronary artery without angina pectoris: Secondary | ICD-10-CM

## 2017-02-14 DIAGNOSIS — E785 Hyperlipidemia, unspecified: Secondary | ICD-10-CM | POA: Diagnosis not present

## 2017-02-14 DIAGNOSIS — K5900 Constipation, unspecified: Secondary | ICD-10-CM

## 2017-02-14 DIAGNOSIS — I10 Essential (primary) hypertension: Secondary | ICD-10-CM

## 2017-02-14 NOTE — Progress Notes (Signed)
Location:   [facility] Room Number: 104/W Place of Service:  SNF (31) Provider:  Gardiner Fanti, Rene Kocher, MD  Patient Care Team: [name], MD as PCP - General (Internal Medicine) Danie Binder, MD as Consulting Physician (Gastroenterology)  Extended Emergency Contact Information Primary Emergency Contact: Dorathy Kinsman [place] of [place] Phone: [phone] Relation: Daughter Secondary Emergency Contact: [place] Phone: [phone] Relation: Daughter  Code Status:  DNR Goals of care: Advanced Directive information Advanced Directives 02/14/2017  Does Patient Have a Medical Advance Directive? Yes  Type of Advance Directive Out of facility DNR (pink MOST or yellow form)  Does patient want to make changes to medical advance directive? No - Patient declined  Copy of [place] in Chart? No - copy requested  Would patient like information on creating a medical advance directive? -  Pre-existing out of facility DNR order (yellow form or pink MOST form) -     Chief Complaint  Patient presents with  . Medical Management of Chronic Issues    Routine Visit  Medical management of chronic medical issues including history of GI bleed-diabetes type 2-CVA-hypertension-hyperlipidemia-constipation-coronary artery disease status post CABG-carotid artery disease-BPH  HPI:  Pt is a 82 y.o. male seen today for medical management of chronic diseases as noted above.  Patient has done well on skilled care actually when he came year he had significant failure to thrive complications but this has made a dramatic turnaround actually weight gain at one point became a concern since he was getting so much but this is stabilized with current weight of 182-he does love to eat.  But he has apparently come back to with nursing help.  He also has a history of GI bleed thought secondary to AVM  malformations on a proton pump inhibitor as well as iron and hemoglobin has remained stable most recently 11.9 on lab done in August 2018 this is followed by GI as well.  TIAs also been following his constipation and did start him on Linzess he apparently is having regular bowel movements.--He was thought to be stable and doing well per recent review of GI consult no.  In regards to coronary artery disease he is status post CABG in 2000 and PCI of the left main coronary artery back in 2003 continues on Plavix labetalol and pravastatin and this has been essentially asymptomatic during his stay here.  LDL was 68 on the lab done in August.  He has a CVA he continues on Plavix and up to be doing well with minimal deficits ambulates in a wheelchair.  He does have a history of legal blindness with opacity of his left eye recently had significant junk Karrie Doffing of his right eye he did see ophthalmology and they prescribed prednisone drops which appear to have really helped.  Regards to hypertension he does have a history of hypertensive urgency had been on several agents at one point this appears to have stabilized on current medications including Norvasc labetalol and lisinopril I got 128/70 manually this afternoon-see previous reading 135 50.  Regards to diabetes type 2 [place] Glucophage CBGs have been largely in the low 100s and quite consistent hemoglobin A1c was 6.6 back in August.  At times he does have a history of UTI in responds well to antibiotics this has not been an issue recently he does have a history of BPH and is on Flomax.  Currently he is sitting  in his wheelchair comfortably is bright alert and interactive at times will sing in the hall-he appears to be enjoying life    .     Past Medical History:  Diagnosis Date  . Anemia due to chronic blood loss   . CAD (coronary artery disease)    cath 11/2007: 70% LM ISR, SVG-LAD OK, SVG-D1 100%, IMA-D2 OK, SVG-OM OK, 4-vessel CABG 2000.    Marland Kitchen CAD (coronary artery disease)    s/p of 95% L main artery stenosis, 4/03: cutting balloon PTCA of 80% in-stent restenosis, 7/04. preserved L ventriculr function  . Carotid bruit    bilateral. no sigificant distal abd atherosclerosis by recent cath.   . Chronic bronchitis ([place])   . Constipation   . COPD (chronic obstructive pulmonary disease) (HCC)    ongoing tobacco   . CVA (cerebral infarction)   . DM2 (diabetes mellitus, type 2) ([place])   . Dyslipidemia   . Dysphagia   . Glaucoma   . HTN (hypertension)    Past Surgical History:  Procedure Laterality Date  . CARDIAC CATHETERIZATION    . COLONOSCOPY N/A 05/14/2015   POOR PREP  . COLONOSCOPY N/A 06/02/2015   SLF: 1. four colorectal polyps removed. no source for anemia identified. 2. the left colon is redundant 3. moderate sized internal hemorroids.   . ESOPHAGOGASTRODUODENOSCOPY N/A 05/14/2015   CHRONIC GASTRITIS  . GIVENS CAPSULE STUDY N/A 06/17/2015   Procedure: GIVENS CAPSULE STUDY;  Surgeon: Danie Binder, MD;  Location[facility];  Service: Endoscopy;  Laterality: N/A;  0800    No Known Allergies  Outpatient Encounter Prescriptions as of 02/14/2017  Medication Sig  . amLODipine (NORVASC) 2.5 MG tablet Take 5 mg by mouth daily.   . Calcium Carbonate-Vitamin D (OSCAL 500/200 D-3 PO) 1 Tablet once a day by mouth . Can't be given with iron  . clopidogrel (PLAVIX) 75 MG tablet Take 75 mg by mouth daily.  Marland Kitchen docusate sodium (COLACE) 100 MG capsule Take 100 mg by mouth 2 (two) times daily.  . ferrous sulfate (QC FERROUS SULFATE) 325 (65 FE) MG tablet Take 325 mg by mouth 2 (two) times daily with a meal.  . labetalol (NORMODYNE) 200 MG tablet Take 200 mg by mouth 3 (three) times daily. Reported on 10/22/2015  . linaclotide (LINZESS) 72 MCG capsule Take 72 mcg by mouth daily before breakfast.  . lisinopril (PRINIVIL,ZESTRIL) 40 MG tablet Take 40 mg by mouth daily.  . metFORMIN (GLUCOPHAGE) 500 MG tablet Take 500 mg by mouth 2  (two) times daily with a meal.   . Multiple Vitamin (MULTIVITAMIN) tablet Take 1 tablet by mouth daily.  . Omega-3 Fatty Acids (FISH OIL) 1000 MG CAPS Take 1,000 mg by mouth 3 (three) times daily.  . pantoprazole (PROTONIX) 40 MG tablet Take 40 mg by mouth daily.  . pravastatin (PRAVACHOL) 80 MG tablet Take 80 mg by mouth daily.  Marland Kitchen senna (SENOKOT) 8.6 MG TABS tablet Take 2 tablets (17.2 mg total) by mouth daily.  . sodium chloride (OCEAN) 0.65 % SOLN nasal spray Place 1 spray into both nostrils 2 (two) times daily.  . tamsulosin (FLOMAX) 0.4 MG CAPS capsule Take 0.4 mg by mouth daily after supper.   No facility-administered encounter medications on file as of 02/14/2017.      Review of Systems  In general is not complaining of any fever or chills weight appears to have stabilized in the low 180s.  Skin does not complain of any rashes or  itching.  Eyes is legally blind --he dids have significant conjunctivitis of his right eye at one point but prednisone eyedrops by ophthalmology appears to have really helped  Oropharynx does not complain of sore throat.  Respiratory denies any shortness of breath or cough.  Cardiac does not complain of chest pain does not appear to have lower extremity edema of significance.  GI does not complain of abdominal pain continues with a good appetite apparently is trying to modify diet somewhat so he does not precipitously gain weight does not complain of any nausea or vomiting diarrhea-constipation appears to be resolved on current medications.  GU does have a history of BPH but is not complaining of any dysuria or recent UTIs.  Muscle skeletal does not complain of any joint pain at this time or swelling.  Neurologic is not complaining of any syncope dizziness headache or numbness.  Psych continues to be pleasant appropriate does not exhibit signs of depression or anxiety-had complain of insomnia in the past now receives melatonin he is not complaining  of insomnia today   Immunization History  Administered Date(s) Administered  . Influenza,inj,Quad PF,6+ Mos 03/07/2015  . Influenza-Unspecified 02/03/2016, 02/04/2017  . PPD Test 03/10/2015, 03/24/2015  . Pneumococcal Conjugate-13 02/04/2017  . Pneumococcal-Unspecified 02/10/2016  . Tdap 01/13/2017   Pertinent  Health Maintenance Due  Topic Date Due  . OPHTHALMOLOGY EXAM  04/13/2017 (Originally 02/09/2017)  . HEMOGLOBIN A1C  06/09/2017  . URINE MICROALBUMIN  06/25/2017  . FOOT EXAM  01/26/2018  . INFLUENZA VACCINE  Completed  . PNA vac Low Risk Adult  Completed   Fall Risk  01/10/2017  Falls in the past year? No   Functional Status Survey:    Vitals:   02/14/17 1235  BP: (!) 137/50  Pulse: (!) 56  Resp: 20  Temp: (!) 97.1 F (36.2 C)  TempSrc: Oral  SpO2: 98%  Weight: 182 lb 3.2 oz (82.6 kg)  Height: 5\' 9"  (1.753 m)   Body mass index is 26.91 kg/m. Physical Exam   In general this is a very pleasant elderly male in no distress sitting comfortably in his wheelchair.  His skin is warm and dry there is no diaphoresis.  Eyes has history of legal blindness with opacity all his left eye right eye conjunctiva appears significantly clearer than when I last evaluated him for conjunctivitis.  Oropharynx is clear mucous membranes are moist.  Chest continues to be clear to auscultation there is no labored breathing somewhat shallow air entry.  Heart is regular rate and rhythm with a 2/6 systolic murmur  without gallop or rub at times will be bradycardic in the high 50s but is asymptomatic He has scant lower extremity edema.  His abdomen is obese soft nontender with active bowel sounds.  Musculoskeletal continues to move all extremities at baseline has very slight left-sided weakness compared to the right he is ambulatory in a wheelchair.  Neurologic as noted above does have some mild left facial weakness but his speech is clear.  Psych he is grossly alert oriented with  some cognitive deficits but does. Well with supportive care is interactive in good humor very pleasant disposition.    Labs reviewed:  Recent Labs  08/31/16 0700 10/23/16 0420 12/07/16 0700  NA 138 142 139  K 3.9 4.5 4.0  CL 105 109 106  CO2 26 22 25   GLUCOSE 120* 106* 79  BUN 17 18 15   CREATININE 0.87 0.99 0.92  CALCIUM 9.2 9.6 9.1  Recent Labs  06/25/16 0700 07/26/16 0700 08/16/16 1430  AST  --  17 21  ALT  --  13* 18  ALKPHOS  --  53 54  BILITOT  --  0.5 0.2*  PROT  --  6.9 6.8  ALBUMIN 3.9 3.4* 3.4*    Recent Labs  08/16/16 1430 08/31/16 0700 10/23/16 0420 12/07/16 0700  WBC 10.2 8.6 7.1 8.0  NEUTROABS 7.1 5.3 4.0  --   HGB 11.4* 12.0* 12.0* 11.9*  HCT 33.3* 36.9* 36.0* 35.8*  MCV 84.9 86.6 84.9 85.6  PLT 276 357 260 289   Lab Results  Component Value Date   TSH 1.064 10/31/2015   Lab Results  Component Value Date   HGBA1C 6.6 (H) 12/07/2016   Lab Results  Component Value Date   CHOL 126 12/07/2016   HDL 38 (L) 12/07/2016   LDLCALC 68 12/07/2016   TRIG 98 12/07/2016   CHOLHDL 3.3 12/07/2016    Significant Diagnostic Results in last 30 days:  No results found.  Assessment/Plan  #1 history of GI bleed--with iron deficiency- with history as noted above this has been stable now for a considerable amount of time he is followed by GI continues on iron hemoglobin was 11.9 on lab done in August recommendation by GI to check CBC proximally every 3 or 4 months next visit will recheck this clinically he appears stable in addition to iron he is on a proton pump inhibitor.  #2--#2 diabetes type 2 this appears under good control with CBGs largely in the lower 100s he is on Glucophage hemoglobin A1c was 6.6 on lab done in August 2018.  #3 history CVA he has minimal residual deficits he is on Plavix as well as a statin LDL was 68 on August lab.  #4 hypertension at one point was a significant issue but this appears to be stabilized on current doses of  Norvasc labetalol and lisinopril I got a manual reading of 128/70 today.  #5 hyperlipidemia as noted above LDL was 68 on lab done in August he is on pravastatin.  #6 constipation this appears stable on senna and Colace as well as Linzess--GI was concerned about this at one point but per recent consult note this appears to be quite stable.  #7-history of coronary artery disease he is on Plavix as well as labetalol M pravastatin is relatively asymptomatic does have previous history of CABG and PCTA of the left main coronary artery.  #8 history of carotid artery disease with occlusion of the right  RCA--again he continues on above medications including Plavix and a statin.  #9 history of legal blindness with recent conjunctivitis of his right eye again this appears to have responded very well to the prednisone eyedrops prescribed by ophthalmology he does well with supportive care despite his blindness.  #10 history of BPH he is on Flomax this has not really been an issue to my knowledge.  [id number] note greater than 35 minutes spent assessing patient-reviewing his chart including consult notes reviewing his labs-iandcoordinating and formulating a plan of care for numerous diagnoses-no greater than 50% of time spent coordinating plan of care

## 2017-02-22 DIAGNOSIS — Z7984 Long term (current) use of oral hypoglycemic drugs: Secondary | ICD-10-CM | POA: Diagnosis not present

## 2017-02-22 DIAGNOSIS — H2511 Age-related nuclear cataract, right eye: Secondary | ICD-10-CM | POA: Diagnosis not present

## 2017-02-22 DIAGNOSIS — H1712 Central corneal opacity, left eye: Secondary | ICD-10-CM | POA: Diagnosis not present

## 2017-02-22 DIAGNOSIS — E113291 Type 2 diabetes mellitus with mild nonproliferative diabetic retinopathy without macular edema, right eye: Secondary | ICD-10-CM | POA: Diagnosis not present

## 2017-03-14 ENCOUNTER — Non-Acute Institutional Stay (SKILLED_NURSING_FACILITY): Payer: Medicare Other | Admitting: Internal Medicine

## 2017-03-14 ENCOUNTER — Encounter: Payer: Self-pay | Admitting: Internal Medicine

## 2017-03-14 DIAGNOSIS — K5900 Constipation, unspecified: Secondary | ICD-10-CM

## 2017-03-14 DIAGNOSIS — E1151 Type 2 diabetes mellitus with diabetic peripheral angiopathy without gangrene: Secondary | ICD-10-CM | POA: Diagnosis not present

## 2017-03-14 DIAGNOSIS — E785 Hyperlipidemia, unspecified: Secondary | ICD-10-CM

## 2017-03-14 DIAGNOSIS — I10 Essential (primary) hypertension: Secondary | ICD-10-CM

## 2017-03-14 DIAGNOSIS — Z8673 Personal history of transient ischemic attack (TIA), and cerebral infarction without residual deficits: Secondary | ICD-10-CM | POA: Diagnosis not present

## 2017-03-14 DIAGNOSIS — D500 Iron deficiency anemia secondary to blood loss (chronic): Secondary | ICD-10-CM

## 2017-03-14 NOTE — Progress Notes (Signed)
Location:   [place] Room Number: 104/W Place of Service:  SNF [phone]) Provider:  Kyung Rudd, Rene Kocher, MD  Patient Care Team: [name], MD as PCP - General (Internal Medicine) Danie Binder, MD as Consulting Physician (Gastroenterology)  Extended Emergency Contact Information Primary Emergency Contact: Dorathy Kinsman States of [place] Phone: [phone] Relation: Daughter Secondary Emergency Contact: [place] of [place] Phone: [phone] Relation: Daughter  Code Status:  DNR Goals of care: Advanced Directive information Advanced Directives 02/14/2017  Does Patient Have a Medical Advance Directive? Yes  Type of Advance Directive Out of facility DNR (pink MOST or yellow form)  Does patient want to make changes to medical advance directive? No - Patient declined  Copy of [place] in Chart? No - copy requested  Would patient like information on creating a medical advance directive? -  Pre-existing out of facility DNR order (yellow form or pink MOST form) -     Chief Complaint  Patient presents with  . Medical Management of Chronic Issues    Routine Visit    HPI:  Pt is a 82 y.o. male seen today for medical management of chronic diseases.    Patient has H/O GI bleed, Diabetes type 2 ,S/P CVA , Hypertension, Hyperlipidemia, constipation. Poor vision . CAD S/P CABG in 2000 and PCI of Left Main on 2003 and Carotid artery stenosis.with complete occlusion of RCA.  Patient is doing well in facility. He did not have any new complains.His weight is stable at 183 lbs.Appetite is good. His Right Eye redness is resolved. No new Nursing issues.  Past Medical History:  Diagnosis Date  . Anemia due to chronic blood loss   . CAD (coronary artery disease)    cath 11/2007: 70% LM ISR, SVG-LAD OK, SVG-D1 100%, IMA-D2 OK, SVG-OM OK, 4-vessel CABG 2000.  Marland Kitchen CAD (coronary artery disease)     s/p of 95% L main artery stenosis, 4/03: cutting balloon PTCA of 80% in-stent restenosis, 7/04. preserved L ventriculr function  . Carotid bruit    bilateral. no sigificant distal abd atherosclerosis by recent cath.   . Chronic bronchitis ([place])   . Constipation   . COPD (chronic obstructive pulmonary disease) (HCC)    ongoing tobacco   . CVA (cerebral infarction)   . DM2 (diabetes mellitus, type 2) ([place])   . Dyslipidemia   . Dysphagia   . Glaucoma   . HTN (hypertension)    Past Surgical History:  Procedure Laterality Date  . CARDIAC CATHETERIZATION      No Known Allergies  Outpatient Encounter Medications as of 03/14/2017  Medication Sig  . amLODipine (NORVASC) 2.5 MG tablet Take 5 mg by mouth daily.   . Calcium Carbonate-Vitamin D (OSCAL 500/200 D-3 PO) 1 Tablet once a day by mouth . Can't be given with iron  . clopidogrel (PLAVIX) 75 MG tablet Take 75 mg by mouth daily.  Marland Kitchen docusate sodium (COLACE) 100 MG capsule Take 100 mg by mouth 2 (two) times daily.  . ferrous sulfate (QC FERROUS SULFATE) 325 (65 FE) MG tablet Take 325 mg by mouth 2 (two) times daily with a meal.  . labetalol (NORMODYNE) 200 MG tablet Take 200 mg by mouth 3 (three) times daily. Reported on 10/22/2015  . linaclotide (LINZESS) 72 MCG capsule Take 72 mcg by mouth daily before breakfast.  . lisinopril (PRINIVIL,ZESTRIL) 40 MG tablet Take 40 mg by mouth daily.  . metFORMIN (GLUCOPHAGE)  500 MG tablet Take 500 mg by mouth 2 (two) times daily with a meal.   . Multiple Vitamin (MULTIVITAMIN) tablet Take 1 tablet by mouth daily.  . Omega-3 Fatty Acids (FISH OIL) 1000 MG CAPS Take 1,000 mg by mouth 3 (three) times daily.  . pantoprazole (PROTONIX) 40 MG tablet Take 40 mg by mouth daily.  . pravastatin (PRAVACHOL) 80 MG tablet Take 80 mg by mouth daily.  Marland Kitchen senna (SENOKOT) 8.6 MG TABS tablet Take 2 tablets (17.2 mg total) by mouth daily.  . sodium chloride (OCEAN) 0.65 % SOLN nasal spray Place 1 spray into both  nostrils 2 (two) times daily.  . tamsulosin (FLOMAX) 0.4 MG CAPS capsule Take 0.4 mg by mouth daily after supper.   No facility-administered encounter medications on file as of 03/14/2017.      Review of Systems  Review of Systems  Constitutional: Negative for activity change, appetite change, chills, diaphoresis, fatigue and fever.  HENT: Negative for mouth sores, postnasal drip, rhinorrhea, sinus pain and sore throat.   Respiratory: Negative for apnea, cough, chest tightness, shortness of breath and wheezing.   Cardiovascular: Negative for chest pain, palpitations and leg swelling.  Gastrointestinal: Negative for abdominal distention, abdominal pain, constipation, diarrhea, nausea and vomiting.  Genitourinary: Negative for dysuria and frequency.  Musculoskeletal: Negative for arthralgias, joint swelling and myalgias.  Skin: Negative for rash.  Neurological: Negative for dizziness, syncope, weakness, light-headedness and numbness.  Psychiatric/Behavioral: Negative for behavioral problems, confusion and sleep disturbance.     Immunization History  Administered Date(s) Administered  . Influenza,inj,Quad PF,6+ Mos 03/07/2015  . Influenza-Unspecified 02/03/2016, 02/04/2017  . PPD Test 03/10/2015, 03/24/2015  . Pneumococcal Conjugate-13 02/04/2017  . Pneumococcal-Unspecified 02/10/2016  . Tdap 01/13/2017   Pertinent  Health Maintenance Due  Topic Date Due  . OPHTHALMOLOGY EXAM  04/13/2017 (Originally 02/09/2017)  . HEMOGLOBIN A1C  06/09/2017  . URINE MICROALBUMIN  06/25/2017  . FOOT EXAM  01/26/2018  . INFLUENZA VACCINE  Completed  . PNA vac Low Risk Adult  Completed   Fall Risk  01/10/2017  Falls in the past year? No   Functional Status Survey:    Vitals:   03/14/17 1212  BP: (!) 153/66  Pulse: (!) 58  Resp: 20  Temp: 98.4 F (36.9 C)  TempSrc: Oral  SpO2: 98%  Weight: 183 lb 9.6 oz (83.3 kg)  Height: 5\' 9"  (1.753 m)   Body mass index is 27.11 kg/m. Physical  Exam  Constitutional: He appears well-developed and well-nourished.  HENT:  Head: Normocephalic.  Mouth/Throat: Oropharynx is clear and moist.  Neck: Neck supple.  Cardiovascular: Normal rate and normal heart sounds.  Pulmonary/Chest: Effort normal and breath sounds normal. No respiratory distress. He has no wheezes. He has no rales.  Abdominal: Soft. Bowel sounds are normal. He exhibits no distension. There is no tenderness. There is no rebound.  Musculoskeletal: He exhibits no edema.  Neurological: He is alert.  Skin: Skin is warm and dry.  Psychiatric: He has a normal mood and affect. His behavior is normal. Thought content normal.    Labs reviewed: Recent Labs    08/31/16 0700 10/23/16 0420 12/07/16 0700  NA 138 142 139  K 3.9 4.5 4.0  CL 105 109 106  CO2 26 22 25   GLUCOSE 120* 106* 79  BUN 17 18 15   CREATININE 0.87 0.99 0.92  CALCIUM 9.2 9.6 9.1   Recent Labs    06/25/16 0700 07/26/16 0700 08/16/16 1430  AST  --  17  21  ALT  --  13* 18  ALKPHOS  --  53 54  BILITOT  --  0.5 0.2*  PROT  --  6.9 6.8  ALBUMIN 3.9 3.4* 3.4*   Recent Labs    08/16/16 1430 08/31/16 0700 10/23/16 0420 12/07/16 0700  WBC 10.2 8.6 7.1 8.0  NEUTROABS 7.1 5.3 4.0  --   HGB 11.4* 12.0* 12.0* 11.9*  HCT 33.3* 36.9* 36.0* 35.8*  MCV 84.9 86.6 84.9 85.6  PLT 276 357 260 289   Lab Results  Component Value Date   TSH 1.064 10/31/2015   Lab Results  Component Value Date   HGBA1C 6.6 (H) 12/07/2016   Lab Results  Component Value Date   CHOL 126 12/07/2016   HDL 38 (L) 12/07/2016   LDLCALC 68 12/07/2016   TRIG 98 12/07/2016   CHOLHDL 3.3 12/07/2016    Significant Diagnostic Results in last 30 days:  No results found.  Assessment/Plan Essential hypertension BP slightly elevated today. Usually it stays in good control. On Norvasc, Labetalol and Lisinopril  DM (diabetes mellitus) with peripheral vascular complication (HCC) Aic 6.6 in 08/18 BS running less then  150 Continue on Glucophage Repeat A1C  CAD Stable on Plavix, Beta blocker and Statin  Constipation,  On Linzess and senna  Iron deficiency anemia due to chronic blood loss HGB stable on Iron. Will reduce the dose to QD  Hyperlipidemia,  LDL is 68 in 08/18on Statin  s/p CVA Continue Plavix Eye congestion Possible irritation follows with Ophthalmologist. Per their Note Patient does have Diabetic retinopathy. Needs Good control of Diabetes.   Family/ staff Communication:   Labs/tests ordered:

## 2017-03-15 ENCOUNTER — Encounter (HOSPITAL_COMMUNITY)
Admission: RE | Admit: 2017-03-15 | Discharge: 2017-03-15 | Disposition: A | Discharge status: Home / Self Care | Payer: Medicare Other | Source: Skilled Nursing Facility | Attending: Internal Medicine | Admitting: Internal Medicine

## 2017-03-15 DIAGNOSIS — I10 Essential (primary) hypertension: Secondary | ICD-10-CM | POA: Diagnosis not present

## 2017-03-15 LAB — BASIC METABOLIC PANEL
ANION GAP: 8 (ref 5–15)
BUN: 14 mg/dL (ref 6–20)
CALCIUM: 9.2 mg/dL (ref 8.9–10.3)
CHLORIDE: 106 mmol/L (ref 101–111)
CO2: 25 mmol/L (ref 22–32)
CREATININE: 0.81 mg/dL (ref 0.61–1.24)
GFR calc Af Amer: >60 mL/min (ref >60)
GFR calc non Af Amer: >60 mL/min (ref >60)
GLUCOSE: 111 mg/dL — AB (ref 65–99)
Potassium: 3.8 mmol/L (ref 3.5–5.1)
Sodium: 139 mmol/L (ref 135–145)

## 2017-03-15 LAB — CBC
HEMATOCRIT: 35.8 % — AB (ref 39.0–52.0)
HEMOGLOBIN: 11.8 g/dL — AB (ref 13.0–17.0)
MCH: 28.1 pg (ref 26.0–34.0)
MCHC: 33 g/dL (ref 30.0–36.0)
MCV: 85.2 fL (ref 78.0–100.0)
Platelets: 279 10*3/uL (ref 150–400)
RBC: 4.2 MIL/uL — ABNORMAL LOW (ref 4.22–5.81)
RDW: 13.5 % (ref 11.5–15.5)
WBC: 8.6 10*3/uL (ref 4.0–10.5)

## 2017-03-15 LAB — HEMOGLOBIN A1C
Hgb A1c MFr Bld: 7.3 % — ABNORMAL HIGH (ref 4.8–5.6)
Mean Plasma Glucose: 162.81 mg/dL

## 2017-04-18 ENCOUNTER — Encounter: Payer: Self-pay | Admitting: Internal Medicine

## 2017-04-18 ENCOUNTER — Non-Acute Institutional Stay (SKILLED_NURSING_FACILITY): Payer: Medicare Other | Admitting: Internal Medicine

## 2017-04-18 DIAGNOSIS — D500 Iron deficiency anemia secondary to blood loss (chronic): Secondary | ICD-10-CM | POA: Diagnosis not present

## 2017-04-18 DIAGNOSIS — I10 Essential (primary) hypertension: Secondary | ICD-10-CM | POA: Diagnosis not present

## 2017-04-18 DIAGNOSIS — Z8673 Personal history of transient ischemic attack (TIA), and cerebral infarction without residual deficits: Secondary | ICD-10-CM | POA: Diagnosis not present

## 2017-04-18 DIAGNOSIS — K5900 Constipation, unspecified: Secondary | ICD-10-CM | POA: Diagnosis not present

## 2017-04-18 DIAGNOSIS — I2510 Atherosclerotic heart disease of native coronary artery without angina pectoris: Secondary | ICD-10-CM | POA: Diagnosis not present

## 2017-04-18 DIAGNOSIS — E1151 Type 2 diabetes mellitus with diabetic peripheral angiopathy without gangrene: Secondary | ICD-10-CM

## 2017-04-18 NOTE — Progress Notes (Deleted)
Location:   [place] Room Number: 104/W Place of Service:  SNF [phone]) Provider:  Freddi Starr, MD  Patient Care Team: [name], MD as PCP - General (Internal Medicine) Danie Binder, MD as Consulting Physician (Gastroenterology)  Extended Emergency Contact Information Primary Emergency Contact: Dorathy Kinsman States of [place] Phone: [phone] Relation: Daughter Secondary Emergency Contact: [place] of [place] Phone: [phone] Relation: Daughter  Code Status:  DNR Goals of care: Advanced Directive information Advanced Directives 04/18/2017  Does Patient Have a Medical Advance Directive? Yes  Type of Advance Directive Out of facility DNR (pink MOST or yellow form)  Does patient want to make changes to medical advance directive? No - Patient declined  Copy of [place] in Chart? No - copy requested  Would patient like information on creating a medical advance directive? -  Pre-existing out of facility DNR order (yellow form or pink MOST form) -     Chief Complaint  Patient presents with  . Acute Visit    Patients c/o Boil on Scrotum    HPI:  Pt is a 82 y.o. male seen today for an acute visit for a boil apparently on his scrotum.  Apparently this was picked up incidentally during patient care.  He is not really complaining of pain he is afebrile vital signs are stable.  No   Past Medical History:  Diagnosis Date  . Anemia due to chronic blood loss   . CAD (coronary artery disease)    cath 11/2007: 70% LM ISR, SVG-LAD OK, SVG-D1 100%, IMA-D2 OK, SVG-OM OK, 4-vessel CABG 2000.  Marland Kitchen CAD (coronary artery disease)    s/p of 95% L main artery stenosis, 4/03: cutting balloon PTCA of 80% in-stent restenosis, 7/04. preserved L ventriculr function  . Carotid bruit    bilateral. no sigificant distal abd atherosclerosis by recent cath.   . Chronic bronchitis ([place])    . Constipation   . COPD (chronic obstructive pulmonary disease) (HCC)    ongoing tobacco   . CVA (cerebral infarction)   . DM2 (diabetes mellitus, type 2) ([place])   . Dyslipidemia   . Dysphagia   . Glaucoma   . HTN (hypertension)    Past Surgical History:  Procedure Laterality Date  . CARDIAC CATHETERIZATION    . COLONOSCOPY N/A 05/14/2015   POOR PREP  . COLONOSCOPY N/A 06/02/2015   SLF: 1. four colorectal polyps removed. no source for anemia identified. 2. the left colon is redundant 3. moderate sized internal hemorroids.   . ESOPHAGOGASTRODUODENOSCOPY N/A 05/14/2015   CHRONIC GASTRITIS  . GIVENS CAPSULE STUDY N/A 06/17/2015   Procedure: GIVENS CAPSULE STUDY;  Surgeon: Danie Binder, MD;  Location: [facility];  Service: Endoscopy;  Laterality: N/A;  0800    No Known Allergies  Outpatient Encounter Medications as of 04/18/2017  Medication Sig  . amLODipine (NORVASC) 2.5 MG tablet Take 5 mg by mouth daily.   . Calcium Carbonate-Vitamin D (OSCAL 500/200 D-3 PO) 1 Tablet once a day by mouth . Can't be given with iron  . clopidogrel (PLAVIX) 75 MG tablet Take 75 mg by mouth daily.  Marland Kitchen docusate sodium (COLACE) 100 MG capsule Take 100 mg by mouth 2 (two) times daily.  . ferrous sulfate (QC FERROUS SULFATE) 325 (65 FE) MG tablet Take 325 mg by mouth 2 (two) times daily with a meal.  . labetalol (NORMODYNE) 200 MG tablet Take 200  mg by mouth 3 (three) times daily. Reported on 10/22/2015  . linaclotide (LINZESS) 72 MCG capsule Take 72 mcg by mouth daily before breakfast.  . lisinopril (PRINIVIL,ZESTRIL) 40 MG tablet Take 40 mg by mouth daily.  . metFORMIN (GLUCOPHAGE) 500 MG tablet Take 500 mg by mouth 2 (two) times daily with a meal.   . Multiple Vitamin (MULTIVITAMIN) tablet Take 1 tablet by mouth daily.  . Omega-3 Fatty Acids (FISH OIL) 1000 MG CAPS Take 1,000 mg by mouth 3 (three) times daily.  . pantoprazole (PROTONIX) 40 MG tablet Take 40 mg by mouth daily.  . pravastatin  (PRAVACHOL) 80 MG tablet Take 80 mg by mouth daily.  Marland Kitchen senna (SENOKOT) 8.6 MG TABS tablet Take 2 tablets (17.2 mg total) by mouth daily.  . sodium chloride (OCEAN) 0.65 % SOLN nasal spray Place 1 spray into both nostrils 2 (two) times daily.  . tamsulosin (FLOMAX) 0.4 MG CAPS capsule Take 0.4 mg by mouth daily after supper.   No facility-administered encounter medications on file as of 04/18/2017.     Review of Systems  Immunization History  Administered Date(s) Administered  . Influenza,inj,Quad PF,6+ Mos 03/07/2015  . Influenza-Unspecified 02/03/2016, 02/04/2017  . PPD Test 03/10/2015, 03/24/2015  . Pneumococcal Conjugate-13 02/04/2017  . Pneumococcal-Unspecified 02/10/2016  . Tdap 01/13/2017   Pertinent  Health Maintenance Due  Topic Date Due  . URINE MICROALBUMIN  06/25/2017  . HEMOGLOBIN A1C  09/12/2017  . FOOT EXAM  01/26/2018  . OPHTHALMOLOGY EXAM  02/23/2018  . INFLUENZA VACCINE  Completed  . PNA vac Low Risk Adult  Completed   Fall Risk  01/10/2017  Falls in the past year? No   Functional Status Survey:    Vitals:   04/18/17 1230  BP: 124/71  Pulse: 62  Resp: 20  Temp: (!) 97.1 F (36.2 C)  TempSrc: Oral  SpO2: 97%   There is no height or weight on file to calculate BMI. Physical Exam  Labs reviewed: Recent Labs    10/23/16 0420 12/07/16 0700 03/15/17 0400  NA 142 139 139  K 4.5 4.0 3.8  CL 109 106 106  CO2 22 25 25   GLUCOSE 106* 79 111*  BUN 18 15 14   CREATININE 0.99 0.92 0.81  CALCIUM 9.6 9.1 9.2   Recent Labs    06/25/16 0700 07/26/16 0700 08/16/16 1430  AST  --  17 21  ALT  --  13* 18  ALKPHOS  --  53 54  BILITOT  --  0.5 0.2*  PROT  --  6.9 6.8  ALBUMIN 3.9 3.4* 3.4*   Recent Labs    08/16/16 1430 08/31/16 0700 10/23/16 0420 12/07/16 0700 03/15/17 0400  WBC 10.2 8.6 7.1 8.0 8.6  NEUTROABS 7.1 5.3 4.0  --   --   HGB 11.4* 12.0* 12.0* 11.9* 11.8*  HCT 33.3* 36.9* 36.0* 35.8* 35.8*  MCV 84.9 86.6 84.9 85.6 85.2  PLT 276  357 260 289 279   Lab Results  Component Value Date   TSH 1.064 10/31/2015   Lab Results  Component Value Date   HGBA1C 7.3 (H) 03/15/2017   Lab Results  Component Value Date   CHOL 126 12/07/2016   HDL 38 (L) 12/07/2016   LDLCALC 68 12/07/2016   TRIG 98 12/07/2016   CHOLHDL 3.3 12/07/2016    Significant Diagnostic Results in last 30 days:  No results found.  Assessment/Plan There are no diagnoses linked to this encounter.      Marisa Cyphers  C, [place] [phone]

## 2017-04-18 NOTE — Progress Notes (Signed)
Location:   [facility] Room Number: 104/W Place of Service:  SNF (31) Provider:  Gardiner Fanti, Rene Kocher, MD  Patient Care Team: [name], MD as PCP - General (Internal Medicine) Danie Binder, MD as Consulting Physician (Gastroenterology)  Extended Emergency Contact Information Primary Emergency Contact: Dorathy Kinsman States of [place] Phone: [phone] Relation: Daughter Secondary Emergency Contact: [place] of [place] Phone: [phone] Relation: Daughter  Code Status:  DNR Goals of care: Advanced Directive information Advanced Directives 04/18/2017  Does Patient Have a Medical Advance Directive? Yes  Type of Advance Directive Out of facility DNR (pink MOST or yellow form)  Does patient want to make changes to medical advance directive? No - Patient declined  Copy of [place] in Chart? No - copy requested  Would patient like information on creating a medical advance directive? -  Pre-existing out of facility DNR order (yellow form or pink MOST form) -     Chief Complaint  Patient presents with  . Medical Management of Chronic Issues    Routine Visit   . Acute Visit    Patients c/o Boil on scrotum  Medical management of chronic medical conditions including hypertension-history of GI bleed-coronary artery disease-diabetes type 2- history of chronic conjunctivitis- history of CVA.    HPI:  Pt is a 82 y.o. male seen today for medical management of chronic diseases.  As noted above he continues to be quite stable he is gained about 5 pounds since last month but I suspect this is appetite related he did very well.  When he first came air he had significant failure to thrive issues concerns with weight loss but this is certainly dramatically turned around he has a history of GI bleed thought secondary to AVM malformations he is on a proton pump inhibitor as well as iron  hemoglobin has been stable most recently 11.8 on lab done in [place] has been followed by GI as well.  Is also been started on Linzess by GI secondary to constipation issues this appears largely resolved he is also on senna and Colace.  He also has a history of coronary artery disease status post CABG back in 2000 and PCI of the left main coronary artery back in 2003-he is on Plavix as well as a beta-blocker and a statin and has been essentially asymptomatic during his stay here his LDL was 68 on lab done in August.  Regards to CVA continues on Plavix appears to have minimal deficits can transfer with minimal assistance largely ambulates in a wheelchair.  He is a type II diabetic on Glucophage last hemoglobin A1c was 7.3 weight is up slightly from his baseline which is in the high 6 CBGs actually appear to be quite stable largely in the lower mid 100s--.  Most acute issue today is apparently a small boil on his scrotum apparently this was found incidentally by nursing staff he is not really complaining of pain or discomfort with this       Past Medical History:  Diagnosis Date  . Anemia due to chronic blood loss   . CAD (coronary artery disease)    cath 11/2007: 70% LM ISR, SVG-LAD OK, SVG-D1 100%, IMA-D2 OK, SVG-OM OK, 4-vessel CABG 2000.  Marland Kitchen CAD (coronary artery disease)    s/p of 95% L main artery stenosis, 4/03: cutting balloon PTCA of 80% in-stent restenosis, 7/04. preserved L ventriculr function  . Carotid bruit  bilateral. no sigificant distal abd atherosclerosis by recent cath.   . Chronic bronchitis ([place])   . Constipation   . COPD (chronic obstructive pulmonary disease) (HCC)    ongoing tobacco   . CVA (cerebral infarction)   . DM2 (diabetes mellitus, type 2) ([place])   . Dyslipidemia   . Dysphagia   . Glaucoma   . HTN (hypertension)    Past Surgical History:  Procedure Laterality Date  . CARDIAC CATHETERIZATION    . COLONOSCOPY N/A 05/14/2015   POOR PREP  .  COLONOSCOPY N/A 06/02/2015   SLF: 1. four colorectal polyps removed. no source for anemia identified. 2. the left colon is redundant 3. moderate sized internal hemorroids.   . ESOPHAGOGASTRODUODENOSCOPY N/A 05/14/2015   CHRONIC GASTRITIS  . GIVENS CAPSULE STUDY N/A 06/17/2015   Procedure: GIVENS CAPSULE STUDY;  Surgeon: Danie Binder, MD;  [facility];  Service: Endoscopy;  Laterality: N/A;  0800    No Known Allergies  Outpatient Encounter Medications as of 04/18/2017  Medication Sig  . amLODipine (NORVASC) 2.5 MG tablet Take 5 mg by mouth daily.   . Calcium Carbonate-Vitamin D (OSCAL 500/200 D-3 PO) 1 Tablet once a day by mouth . Can't be given with iron  . clopidogrel (PLAVIX) 75 MG tablet Take 75 mg by mouth daily.  Marland Kitchen docusate sodium (COLACE) 100 MG capsule Take 100 mg by mouth 2 (two) times daily.  . ferrous sulfate (QC FERROUS SULFATE) 325 (65 FE) MG tablet Take 325 mg by mouth 2 (two) times daily with a meal.  . labetalol (NORMODYNE) 200 MG tablet Take 200 mg by mouth 3 (three) times daily. Reported on 10/22/2015  . linaclotide (LINZESS) 72 MCG capsule Take 72 mcg by mouth daily before breakfast.  . lisinopril (PRINIVIL,ZESTRIL) 40 MG tablet Take 40 mg by mouth daily.  . metFORMIN (GLUCOPHAGE) 500 MG tablet Take 500 mg by mouth 2 (two) times daily with a meal.   . Multiple Vitamin (MULTIVITAMIN) tablet Take 1 tablet by mouth daily.  . Omega-3 Fatty Acids (FISH OIL) 1000 MG CAPS Take 1,000 mg by mouth 3 (three) times daily.  . pantoprazole (PROTONIX) 40 MG tablet Take 40 mg by mouth daily.  . pravastatin (PRAVACHOL) 80 MG tablet Take 80 mg by mouth daily.  Marland Kitchen senna (SENOKOT) 8.6 MG TABS tablet Take 2 tablets (17.2 mg total) by mouth daily.  . sodium chloride (OCEAN) 0.65 % SOLN nasal spray Place 1 spray into both nostrils 2 (two) times daily.  . tamsulosin (FLOMAX) 0.4 MG CAPS capsule Take 0.4 mg by mouth daily after supper.   No facility-administered encounter medications  on file as of 04/18/2017.      Review of Systems   In general is not complaining of any fever chills says he feels well eating well has gained weight.  Skin does not complain of rashes or itching does have a small boil on his scrotum.  Head ears eyes nose mouth and throat does have opacity of his left eye does not complain of any visual changes from baseline.--Is legally blind and cannot see shadows with his right eye  Does not complain of sore throat.  Respiratory is not complaining of any shortness of breath or cough.  Cardiac denies chest pain has mild pedal edema this appears baseline.  GI is not complaining of any abdominal discomfort nausea vomiting diarrhea constipation.  GU does not complain of dysuria.  Musculoskeletal no complaints of joint pain.  Neurologic does not complain of  dizziness headache or numbness.  Psych continues to be pleasant appropriate no behavior issues noted continues to be gregarious and quite vocal  Immunization History  Administered Date(s) Administered  . Influenza,inj,Quad PF,6+ Mos 03/07/2015  . Influenza-Unspecified 02/03/2016, 02/04/2017  . PPD Test 03/10/2015, 03/24/2015  . Pneumococcal Conjugate-13 02/04/2017  . Pneumococcal-Unspecified 02/10/2016  . Tdap 01/13/2017   Pertinent  Health Maintenance Due  Topic Date Due  . URINE MICROALBUMIN  06/25/2017  . HEMOGLOBIN A1C  09/12/2017  . FOOT EXAM  01/26/2018  . OPHTHALMOLOGY EXAM  02/23/2018  . INFLUENZA VACCINE  Completed  . PNA vac Low Risk Adult  Completed   Fall Risk  01/10/2017  Falls in the past year? No   Functional Status Survey:    Vitals:   04/18/17 1230  BP: 124/71  Pulse: 62  Resp: 20  Temp: (!) 97.1 F (36.2 C)  TempSrc: Oral  SpO2: 97%   Weight is 188 pounds this appears again of about 4 5 pounds over the past month Physical Exam   In general this is a very pleasant elderly male in no distress sitting comfortably in his wheelchair.  His skin is warm  and dry- I did note on scrotum there is a very small almost pinpoint area on his scrotum that appears somewhat pimple-like it is not really tender to palpation--there is no erythema or surrounding firmness.  Eyes she does have opacity of his left eye which is chronic does have a history of legal blindness is able to make out shadows with his right eye  Oropharynx is clear mucous membranes moist.  Chest is clear to auscultation there is no labored breathing.  Heart is regular rate and rhythm at 60 he has fairly minimal pedal edema which appears baseline.  GI abdomen is soft nontender with positive bowel sounds.  Musculoskeletal moves all extremities x4 is able to stand and pivot with minimal assistance- does have a history of quite mild left-sided weakness and a slight mouth droop.  Neurologic as noted above-does have some mild left facial weakness speech is clear.  Psych he is grossly alert and oriented pleasant and appropriate apparently has some mild cognitive deficits but does very well with supportive care  Labs reviewed: Recent Labs    10/23/16 0420 12/07/16 0700 03/15/17 0400  NA 142 139 139  K 4.5 4.0 3.8  CL 109 106 106  CO2 22 25 25   GLUCOSE 106* 79 111*  BUN 18 15 14   CREATININE 0.99 0.92 0.81  CALCIUM 9.6 9.1 9.2   Recent Labs    06/25/16 0700 07/26/16 0700 08/16/16 1430  AST  --  17 21  ALT  --  13* 18  ALKPHOS  --  53 54  BILITOT  --  0.5 0.2*  PROT  --  6.9 6.8  ALBUMIN 3.9 3.4* 3.4*   Recent Labs    08/16/16 1430 08/31/16 0700 10/23/16 0420 12/07/16 0700 03/15/17 0400  WBC 10.2 8.6 7.1 8.0 8.6  NEUTROABS 7.1 5.3 4.0  --   --   HGB 11.4* 12.0* 12.0* 11.9* 11.8*  HCT 33.3* 36.9* 36.0* 35.8* 35.8*  MCV 84.9 86.6 84.9 85.6 85.2  PLT 276 357 260 289 279   Lab Results  Component Value Date   TSH 1.064 10/31/2015   Lab Results  Component Value Date   HGBA1C 7.3 (H) 03/15/2017   Lab Results  Component Value Date   CHOL 126 12/07/2016   HDL  38 (L) 12/07/2016   [place]  68 12/07/2016   TRIG 98 12/07/2016   CHOLHDL 3.3 12/07/2016    Significant Diagnostic Results in last 30 days:  No results found.  Assessment/Plan  : #1- hypertension this appears stable at one point was an issue but this appears stabilized recent blood pressure 124/71-111/49 he is on Norvasc as well as labetalol and lisinopril I do note occasionally has slightly bradycardic readings but is asymptomatic will monitor for now.  2.  History of GI bleed with anemia- but this is been stable now for a considerable amount of time he has been followed by GI he is on iron as well as a proton pump inhibitor hemoglobin has been stable most recently 11.8 on lab done a month ago.  3.  History of coronary artery disease this is been relatively asymptomatic as noted above he is on Plavix as well as a beta-blocker and statin.  4.--History of carotid artery occlusion of RCA-again he is on Plavix at this point appears to be asymptomatic but will have to be watched of note he is also on a statin  5.  History of diabetes type 2 this appears well controlled on Glucophage CBGs have been in the lower mid 100s hemoglobin A1c is 7.3 will monitor I suspect he continues to have some dietary indiscretions at times nursing staff has been working on curtailing this.  6.  Constipation this appears stable on Colace senna and Linzess Linzess was added per recommendation by GI.  7.  Hyperlipidemia this is stable on a statin LDL was 68 back in August will monitor periodically.  8.  History of right eye conjunctivitis this appears fairly resolved at this point he has been followed by ophthalmology apparently there is an element of diabetic retinopathy as well.  9.  History of BPH she is on Flomax-in past has had history of UTIs but this has been stable now for some time which is encouraging.  10.  History of scrotal boil- this appears fairly unremarkable currently will encourage warm compresses  twice a day if there is any change notify provider but this appears fairly benign at this point.  [id number] note greater than 40 minutes spent assessing patient-reviewing his chart-reviewing his labs- and coordinating and formulating a plan of care for numerous diagnoses- of note  greater than 50% of time spent coordinating a plan of care

## 2017-05-05 DIAGNOSIS — B351 Tinea unguium: Secondary | ICD-10-CM | POA: Diagnosis not present

## 2017-05-05 DIAGNOSIS — E1151 Type 2 diabetes mellitus with diabetic peripheral angiopathy without gangrene: Secondary | ICD-10-CM | POA: Diagnosis not present

## 2017-05-18 ENCOUNTER — Encounter: Payer: Self-pay | Admitting: Internal Medicine

## 2017-05-18 ENCOUNTER — Non-Acute Institutional Stay (SKILLED_NURSING_FACILITY): Payer: Medicare Other | Admitting: Internal Medicine

## 2017-05-18 DIAGNOSIS — I10 Essential (primary) hypertension: Secondary | ICD-10-CM

## 2017-05-18 DIAGNOSIS — D500 Iron deficiency anemia secondary to blood loss (chronic): Secondary | ICD-10-CM

## 2017-05-18 DIAGNOSIS — K5900 Constipation, unspecified: Secondary | ICD-10-CM | POA: Diagnosis not present

## 2017-05-18 DIAGNOSIS — E785 Hyperlipidemia, unspecified: Secondary | ICD-10-CM

## 2017-05-18 DIAGNOSIS — I2510 Atherosclerotic heart disease of native coronary artery without angina pectoris: Secondary | ICD-10-CM

## 2017-05-18 DIAGNOSIS — E1151 Type 2 diabetes mellitus with diabetic peripheral angiopathy without gangrene: Secondary | ICD-10-CM

## 2017-05-18 NOTE — Progress Notes (Signed)
Location:   [facility] Room Number: 104/W Place of Service:  SNF (31) Provider:  Freddi Starr, MD  Patient Care Team: [name], MD as PCP - General (Internal Medicine) Danie Binder, MD as Consulting Physician (Gastroenterology)  Extended Emergency Contact Information Primary Emergency Contact: Dorathy Kinsman States of [place] Phone: [phone] Relation: Daughter Secondary Emergency Contact: [place] of [place] Phone: [phone] Relation: Daughter  Code Status:  DNR Goals of care: Advanced Directive information Advanced Directives 05/18/2017  Does Patient Have a Medical Advance Directive? Yes  Type of Advance Directive Out of facility DNR (pink MOST or yellow form)  Does patient want to make changes to medical advance directive? No - Patient declined  Copy of [place] in Chart? No - copy requested  Would patient like information on creating a medical advance directive? -  Pre-existing out of facility DNR order (yellow form or pink MOST form) -     Chief Complaint  Patient presents with  . Medical Management of Chronic Issues    Routine Visit  For medical management of chronic medical issues including hypertension history of GI bleed coronary artery disease type 2 diabetes history of CVA hyperlipidemia constipation  HPI:  Pt is a 83 y.o. male seen today for medical management of chronic diseases.  As noted above.  He continues to have a period of extended stability-- At one point had significant weight gain but this appears to have moderated actually has lost about 3 pounds over the past month continues to have however a good appetite and snacks frequently  Ironically when he first came air he had significant failure to thrive issues and weight loss but again this has dramatically he also has a history of GI bleed thought secondary to AVM malformation hemoglobin  has been stable now for a long time-he is followed by GI as well.  He is on a proton inhibitor as well as iron-globin of 11.8 on lab in November has shown stability.  He was also thought to have patient when he was seen by GI at one point and was started on Linzess he is also on senna and Colace and this appears stable per nursing regards coronary artery disease he does have a history of CABG back in 2000 and PCI of the left main coronary artery 2003-continues on Plavix beta-blocker and statin and has been largely asymptomatic his LDL incidentally was 68 on lab done in August 2018.  Does have a history of CVA with minimal residual deficits he is on Plavix largely ambulates in a wheelchair transfers with minimal assistance.  Regards to type 2 diabetes he is on Glucophage hemoglobin A1c was 7 3 on lab done in November 2018 that is up slightly from his baseline--CBGs appear to be largely lower to mid 100s very rare as there are readings above 200--he also has a history of hypertension this appears to be somewhat challenging at times with elevated systolics blood pressure is 140/67- 146/74- 111/49-- 182/66 I took it manually today got 138/60--according to his nurse today-when she takes it she gets similar readings manually He is on Norvasc 5 mg a day lisinopril 40 mg a day and labetalol 200 mg twice daily   Today he has no complaints he largely sits in the hallway quite vocal gregarious-he does participate in activities as well      Past Medical History:  Diagnosis Date  . Anemia due  to chronic blood loss   . CAD (coronary artery disease)    cath 11/2007: 70% LM ISR, SVG-LAD OK, SVG-D1 100%, IMA-D2 OK, SVG-OM OK, 4-vessel CABG 2000.  Marland Kitchen CAD (coronary artery disease)    s/p of 95% L main artery stenosis, 4/03: cutting balloon PTCA of 80% in-stent restenosis, 7/04. preserved L ventriculr function  . Carotid bruit    bilateral. no sigificant distal abd atherosclerosis by recent cath.   . Chronic  bronchitis ([place])   . Constipation   . COPD (chronic obstructive pulmonary disease) (HCC)    ongoing tobacco   . CVA (cerebral infarction)   . DM2 (diabetes mellitus, type 2) ([place])   . Dyslipidemia   . Dysphagia   . Glaucoma   . HTN (hypertension)    Past Surgical History:  Procedure Laterality Date  . CARDIAC CATHETERIZATION    . COLONOSCOPY N/A 05/14/2015   POOR PREP  . COLONOSCOPY N/A 06/02/2015   SLF: 1. four colorectal polyps removed. no source for anemia identified. 2. the left colon is redundant 3. moderate sized internal hemorroids.   . ESOPHAGOGASTRODUODENOSCOPY N/A 05/14/2015   CHRONIC GASTRITIS  . GIVENS CAPSULE STUDY N/A 06/17/2015   Procedure: GIVENS CAPSULE STUDY;  Surgeon: Danie Binder, MD;  Location[facility];  Service: Endoscopy;  Laterality: N/A;  0800    No Known Allergies  Outpatient Encounter Medications as of 05/18/2017  Medication Sig  . amLODipine (NORVASC) 2.5 MG tablet Take 5 mg by mouth daily.   . Calcium Carbonate-Vitamin D (OSCAL 500/200 D-3 PO) 1 Tablet once a day by mouth . Can't be given with iron  . clopidogrel (PLAVIX) 75 MG tablet Take 75 mg by mouth daily.  Marland Kitchen docusate sodium (COLACE) 100 MG capsule Take 100 mg by mouth 2 (two) times daily.  . ferrous sulfate (QC FERROUS SULFATE) 325 (65 FE) MG tablet Take 325 mg by mouth 2 (two) times daily with a meal.  . labetalol (NORMODYNE) 200 MG tablet Take 200 mg by mouth 3 (three) times daily. Reported on 10/22/2015  . linaclotide (LINZESS) 72 MCG capsule Take 72 mcg by mouth daily before breakfast.  . lisinopril (PRINIVIL,ZESTRIL) 40 MG tablet Take 40 mg by mouth daily.  . metFORMIN (GLUCOPHAGE) 500 MG tablet Take 500 mg by mouth 2 (two) times daily with a meal.   . Multiple Vitamin (MULTIVITAMIN) tablet Take 1 tablet by mouth daily.  . Omega-3 Fatty Acids (FISH OIL) 1000 MG CAPS Take 1,000 mg by mouth 3 (three) times daily.  . pantoprazole (PROTONIX) 40 MG tablet Take 40 mg by mouth daily.  .  pravastatin (PRAVACHOL) 80 MG tablet Take 80 mg by mouth daily.  Marland Kitchen senna (SENOKOT) 8.6 MG TABS tablet Take 2 tablets (17.2 mg total) by mouth daily.  . sodium chloride (OCEAN) 0.65 % SOLN nasal spray Place 1 spray into both nostrils 2 (two) times daily.  . tamsulosin (FLOMAX) 0.4 MG CAPS capsule Take 0.4 mg by mouth daily after supper.   No facility-administered encounter medications on file as of 05/18/2017.      Review of Systems   \General is not complaining any fever chills weight appears to have moderated.  Skin does not complain of rashes or itching.  Head ears eyes nose mouth and throat does not complain of any changes from baseline visually-he is legally blind sees shadows with his right eye.  Respiratory is not complaining of any shortness of breath or cough.  Cardiac is not complaining of any chest  pain has some slight lower extremity edema baseline--more so in the ankle area.  GI does not complain of any abdominal discomfort nausea vomiting diarrhea or constipation continues to have a very good appetite.  GU does not complain of dysuria.  Musculoskeletal at this point does not complain of joint pain   Neurologic-does not complain of headache dizziness or numbness   Psych does not complain of anxiety or depression continues to be in good spirits  Immunization History  Administered Date(s) Administered  . Influenza,inj,Quad PF,6+ Mos 03/07/2015  . Influenza-Unspecified 02/03/2016, 02/04/2017  . PPD Test 03/10/2015, 03/24/2015  . Pneumococcal Conjugate-13 02/04/2017  . Pneumococcal-Unspecified 02/10/2016  . Tdap 01/13/2017   Pertinent  Health Maintenance Due  Topic Date Due  . URINE MICROALBUMIN  06/25/2017  . HEMOGLOBIN A1C  09/12/2017  . FOOT EXAM  01/26/2018  . OPHTHALMOLOGY EXAM  02/23/2018  . INFLUENZA VACCINE  Completed  . PNA vac Low Risk Adult  Completed   Fall Risk  01/10/2017  Falls in the past year? No   Functional Status Survey:    Vitals:    05/18/17 1357  BP: 140/67  Pulse: (!) 57  Resp: 18  Temp: 98.4 F (36.9 C)  TempSrc: Oral  SpO2: 99%  Weight: 184 lb 12.8 oz (83.8 kg)  Height: 5\' 9"  (1.753 m)  Manual blood pressure was 138/60 pulse was 64 Body mass index is 27.29 kg/m. Physical Exam   In general this is a pleasant elderly male in no distress Sitting comfortably in his wheelchair.  His skin is warm and dry.  Eyes does have a left eye opacity with a history of legal blindness is able to make out shadows with his right eye which is baseline.  Oropharynx is clear his mucous membranes moist.  Chest is clear to auscultation with no labored breathing.  Heart is regular rate and rhythm without murmur gallop or rub he has mild pedal edema at baseline.  Abdomen is soft nontender with positive bowel sounds.  Musculoskeletal moves all extremities x4 at baseline does have a very mild left-sided weakness and a slight mouth droop which is baseline as well.  Neurologic as noted above his speech is clear.  Psych he is largely alert and oriented pleasant appropriate with a history of mild cognitive deficits Labs reviewed: Recent Labs    10/23/16 0420 12/07/16 0700 03/15/17 0400  NA 142 139 139  K 4.5 4.0 3.8  CL 109 106 106  CO2 22 25 25   GLUCOSE 106* 79 111*  BUN 18 15 14   CREATININE 0.99 0.92 0.81  CALCIUM 9.6 9.1 9.2   Recent Labs    06/25/16 0700 07/26/16 0700 08/16/16 1430  AST  --  17 21  ALT  --  13* 18  ALKPHOS  --  53 54  BILITOT  --  0.5 0.2*  PROT  --  6.9 6.8  ALBUMIN 3.9 3.4* 3.4*   Recent Labs    08/16/16 1430 08/31/16 0700 10/23/16 0420 12/07/16 0700 03/15/17 0400  WBC 10.2 8.6 7.1 8.0 8.6  NEUTROABS 7.1 5.3 4.0  --   --   HGB 11.4* 12.0* 12.0* 11.9* 11.8*  HCT 33.3* 36.9* 36.0* 35.8* 35.8*  MCV 84.9 86.6 84.9 85.6 85.2  PLT 276 357 260 289 279   Lab Results  Component Value Date   TSH 1.064 10/31/2015   Lab Results  Component Value Date   HGBA1C 7.3 (H) 03/15/2017    Lab Results  Component Value Date  CHOL 126 12/07/2016   HDL 38 (L) 12/07/2016   LDLCALC 68 12/07/2016   TRIG 98 12/07/2016   CHOLHDL 3.3 12/07/2016    Significant Diagnostic Results in last 30 days:  No results found.  Assessment/Plan  #1 history of hypertension he appears to have some variable systolics but manual blood pressures are more reassuring at this point will monitor continue Norvasc 5 mg a day lisinopril 40 mg daily atenolol 200 mg twice daily-apparently at times pulses are less than 50 although this does not appear to be common will write order to hold labetalol for pulse less than 60.  2.  History of GI bleed-anemia as noted above this is thought secondary to AVM malformations this is been stable now for an extended period of time he is on iron as well as Protonix-hemoglobin 11.8 on lab done in November will monitor periodically.  3.  History of coronary artery disease this appears stable he is on a statin as well as Plavix and beta-blocker.  As well as ACE inhibitor-lipid panel showed stability with an LDL of 68 in August 2018.  4.-History of CVA he appears to have somewhat minimal residual deficits here he is on Plavix as well as a statin.  5.  History of hyperlipidemia again continues on statin and fish oil and this appears stable as noted above.  6.  History of constipation continues on numerous agents including senna Linzess and Colace-at one point there was concern by GI for significant constipation but this appears largely resolved no diarrhea per nursing either  #7- history of  BPH-he is on Flomax this appears to be effective as well.  8.  History of diabetes type 2 he is on Glucophage as noted above blood sugars appear to be relatively stable in the lower mid 100s with occasional spikes into the higher 100s rarely above 200 with patient's advanced age and comorbidities somewhat conservative here hemoglobin A1c 7.3 on most recent lab  #9 history of carotid  artery occlusion of the right C8 he is on Plavix appears to be asymptomatic continues on a statin as well.  CPT- [phone]-- of note greater than 35 minutes spent assessing patient discussing his status with nursing staff reviewing his chart-his labs-coordinating and formulating plan of care for numerous diagnoses-of note greater than 50% of time spent coordinating a plan of care.  :

## 2017-06-02 ENCOUNTER — Encounter: Payer: Self-pay | Admitting: Internal Medicine

## 2017-06-02 NOTE — Progress Notes (Signed)
This encounter was created in error - please disregard.

## 2017-06-06 ENCOUNTER — Encounter: Payer: Self-pay | Admitting: Internal Medicine

## 2017-06-06 NOTE — Progress Notes (Signed)
Location:   [facility] Room Number: 104/W Place of Service:  SNF (31) Provider:  Clydene Fake, MD  Patient Care Team: [name], MD as PCP - General (Internal Medicine) Danie Binder, MD as Consulting Physician (Gastroenterology)  Extended Emergency Contact Information Primary Emergency Contact: Dorathy Kinsman [place] of [place] Phone: [phone] Relation: Daughter Secondary Emergency Contact: [place] Phone: [phone] Relation: Daughter  Code Status:  DNR Goals of care: Advanced Directive information Advanced Directives 06/06/2017  Does Patient Have a Medical Advance Directive? Yes  Type of Advance Directive Out of facility DNR (pink MOST or yellow form)  Does patient want to make changes to medical advance directive? No - Patient declined  Copy of [place] in Chart? No - copy requested  Would patient like information on creating a medical advance directive? -  Pre-existing out of facility DNR order (yellow form or pink MOST form) -     Chief Complaint  Patient presents with  . Medical Management of Chronic Issues    Routine Visit    HPI:  Pt is a 83 y.o. male seen today for medical management of chronic diseases.     Past Medical History:  Diagnosis Date  . Anemia due to chronic blood loss   . CAD (coronary artery disease)    cath 11/2007: 70% LM ISR, SVG-LAD OK, SVG-D1 100%, IMA-D2 OK, SVG-OM OK, 4-vessel CABG 2000.  Marland Kitchen CAD (coronary artery disease)    s/p of 95% L main artery stenosis, 4/03: cutting balloon PTCA of 80% in-stent restenosis, 7/04. preserved L ventriculr function  . Carotid bruit    bilateral. no sigificant distal abd atherosclerosis by recent cath.   . Chronic bronchitis ([place])   . Constipation   . COPD (chronic obstructive pulmonary disease) ([facility])    ongoing tobacco   . CVA (cerebral infarction)   . DM2 (diabetes mellitus,  type 2) ([place])   . Dyslipidemia   . Dysphagia   . Glaucoma   . HTN (hypertension)    Past Surgical History:  Procedure Laterality Date  . CARDIAC CATHETERIZATION    . COLONOSCOPY N/A 05/14/2015   POOR PREP  . COLONOSCOPY N/A 06/02/2015   SLF: 1. four colorectal polyps removed. no source for anemia identified. 2. the left colon is redundant 3. moderate sized internal hemorroids.   . ESOPHAGOGASTRODUODENOSCOPY N/A 05/14/2015   CHRONIC GASTRITIS  . GIVENS CAPSULE STUDY N/A 06/17/2015   Procedure: GIVENS CAPSULE STUDY;  Surgeon: Danie Binder, MD;  Location: [facility];  Service: Endoscopy;  Laterality: N/A;  0800    No Known Allergies  Outpatient Encounter Medications as of 06/06/2017  Medication Sig  . amLODipine (NORVASC) 2.5 MG tablet Take 5 mg by mouth daily.   . Calcium Carbonate-Vitamin D (OSCAL 500/200 D-3 PO) 1 Tablet once a day by mouth . Can't be given with iron  . clopidogrel (PLAVIX) 75 MG tablet Take 75 mg by mouth daily.  Marland Kitchen docusate sodium (COLACE) 100 MG capsule Take 100 mg by mouth 2 (two) times daily.  . ferrous sulfate (QC FERROUS SULFATE) 325 (65 FE) MG tablet Take 325 mg by mouth 2 (two) times daily with a meal.  . labetalol (NORMODYNE) 200 MG tablet Take 200 mg by mouth 3 (three) times daily. Reported on 10/22/2015  . linaclotide (LINZESS) 72 MCG capsule Take 72 mcg by mouth daily before breakfast.  . lisinopril (PRINIVIL,ZESTRIL) 40  MG tablet Take 40 mg by mouth daily.  . metFORMIN (GLUCOPHAGE) 500 MG tablet Take 500 mg by mouth 2 (two) times daily with a meal.   . Multiple Vitamin (MULTIVITAMIN) tablet Take 1 tablet by mouth daily.  . Omega-3 Fatty Acids (FISH OIL) 1000 MG CAPS Take 1,000 mg by mouth 3 (three) times daily.  . pantoprazole (PROTONIX) 40 MG tablet Take 40 mg by mouth daily.  . pravastatin (PRAVACHOL) 80 MG tablet Take 80 mg by mouth daily.  Marland Kitchen senna (SENOKOT) 8.6 MG TABS tablet Take 2 tablets (17.2 mg total) by mouth daily.  . sodium chloride (OCEAN)  0.65 % SOLN nasal spray Place 1 spray into both nostrils 2 (two) times daily.  . tamsulosin (FLOMAX) 0.4 MG CAPS capsule Take 0.4 mg by mouth daily after supper.   No facility-administered encounter medications on file as of 06/06/2017.      Review of Systems  Immunization History  Administered Date(s) Administered  . Influenza,inj,Quad PF,6+ Mos 03/07/2015  . Influenza-Unspecified 02/03/2016, 02/04/2017  . PPD Test 03/10/2015, 03/24/2015  . Pneumococcal Conjugate-13 02/04/2017  . Pneumococcal-Unspecified 02/10/2016  . Tdap 01/13/2017   Pertinent  Health Maintenance Due  Topic Date Due  . URINE MICROALBUMIN  06/25/2017  . HEMOGLOBIN A1C  09/12/2017  . FOOT EXAM  01/26/2018  . OPHTHALMOLOGY EXAM  02/23/2018  . INFLUENZA VACCINE  Completed  . PNA vac Low Risk Adult  Completed   Fall Risk  01/10/2017  Falls in the past year? No   Functional Status Survey:    Vitals:   06/06/17 0954  BP: 129/81  Pulse: 77  Resp: 20  Temp: (!) 97.4 F (36.3 C)  TempSrc: Oral  SpO2: 95%  Weight: 184 lb 12.8 oz (83.8 kg)  Height: 5\' 9"  (1.753 m)   Body mass index is 27.29 kg/m. Physical Exam  Labs reviewed: Recent Labs    10/23/16 0420 12/07/16 0700 03/15/17 0400  NA 142 139 139  K 4.5 4.0 3.8  CL 109 106 106  CO2 22 25 25   GLUCOSE 106* 79 111*  BUN 18 15 14   CREATININE 0.99 0.92 0.81  CALCIUM 9.6 9.1 9.2   Recent Labs    06/25/16 0700 07/26/16 0700 08/16/16 1430  AST  --  17 21  ALT  --  13* 18  ALKPHOS  --  53 54  BILITOT  --  0.5 0.2*  PROT  --  6.9 6.8  ALBUMIN 3.9 3.4* 3.4*   Recent Labs    08/16/16 1430 08/31/16 0700 10/23/16 0420 12/07/16 0700 03/15/17 0400  WBC 10.2 8.6 7.1 8.0 8.6  NEUTROABS 7.1 5.3 4.0  --   --   HGB 11.4* 12.0* 12.0* 11.9* 11.8*  HCT 33.3* 36.9* 36.0* 35.8* 35.8*  MCV 84.9 86.6 84.9 85.6 85.2  PLT 276 357 260 289 279   Lab Results  Component Value Date   TSH 1.064 10/31/2015   Lab Results  Component Value Date   HGBA1C  7.3 (H) 03/15/2017   Lab Results  Component Value Date   CHOL 126 12/07/2016   HDL 38 (L) 12/07/2016   LDLCALC 68 12/07/2016   TRIG 98 12/07/2016   CHOLHDL 3.3 12/07/2016    Significant Diagnostic Results in last 30 days:  No results found.  Assessment/Plan There are no diagnoses linked to this encounter.   Family/ staff Communication:   Labs/tests ordered:      This encounter was created in error - please disregard.

## 2017-06-16 ENCOUNTER — Encounter: Payer: Self-pay | Admitting: Internal Medicine

## 2017-06-16 ENCOUNTER — Non-Acute Institutional Stay (SKILLED_NURSING_FACILITY): Payer: Medicare Other | Admitting: Internal Medicine

## 2017-06-16 DIAGNOSIS — I10 Essential (primary) hypertension: Secondary | ICD-10-CM

## 2017-06-16 DIAGNOSIS — Z8673 Personal history of transient ischemic attack (TIA), and cerebral infarction without residual deficits: Secondary | ICD-10-CM | POA: Diagnosis not present

## 2017-06-16 DIAGNOSIS — K5900 Constipation, unspecified: Secondary | ICD-10-CM | POA: Diagnosis not present

## 2017-06-16 DIAGNOSIS — D500 Iron deficiency anemia secondary to blood loss (chronic): Secondary | ICD-10-CM

## 2017-06-16 DIAGNOSIS — E1151 Type 2 diabetes mellitus with diabetic peripheral angiopathy without gangrene: Secondary | ICD-10-CM

## 2017-06-16 DIAGNOSIS — E785 Hyperlipidemia, unspecified: Secondary | ICD-10-CM

## 2017-06-16 NOTE — Progress Notes (Signed)
Location:   [place] Room Number: 104/W Place of Service:  SNF (31) Provider:  Clydene Fake, MD  Patient Care Team: [name], MD as PCP - General (Internal Medicine) Danie Binder, MD as Consulting Physician (Gastroenterology)  Extended Emergency Contact Information Primary Emergency Contact: Dorathy Kinsman [place] of [place] Phone: [phone] Relation: Daughter Secondary Emergency Contact: [place] Phone: [phone] Relation: Daughter  Code Status:  DNR Goals of care: Advanced Directive information Advanced Directives 06/16/2017  Does Patient Have a Medical Advance Directive? Yes  Type of Advance Directive Out of facility DNR (pink MOST or yellow form)  Does patient want to make changes to medical advance directive? No - Patient declined  Copy of [place] in Chart? No - copy requested  Would patient like information on creating a medical advance directive? -  Pre-existing out of facility DNR order (yellow form or pink MOST form) -     Chief Complaint  Patient presents with  . Medical Management of Chronic Issues    Routine Visit    HPI:  Pt is a 83 y.o. male seen today for medical management of chronic diseases.    Patient has H/O GI bleed, Diabetes type 2 ,S/P CVA , Hypertension, Hyperlipidemia, constipation. Poor vision . CAD S/P CABG in 2000 and PCI of Left Main on 2003 and Carotid artery stenosis.with complete occlusion of RCA.  Patient is doing well in the facility.  He did not have any new complaints.  He has no new nursing issues.  His weight is up from 183 to 187 lbs. His appetite is good and he eats a lot of snacks in between.  Past Medical History:  Diagnosis Date  . Anemia due to chronic blood loss   . CAD (coronary artery disease)    cath 11/2007: 70% LM ISR, SVG-LAD OK, SVG-D1 100%, IMA-D2 OK, SVG-OM OK, 4-vessel CABG 2000.  Marland Kitchen  CAD (coronary artery disease)    s/p of 95% L main artery stenosis, 4/03: cutting balloon PTCA of 80% in-stent restenosis, 7/04. preserved L ventriculr function  . Carotid bruit    bilateral. no sigificant distal abd atherosclerosis by recent cath.   . Chronic bronchitis ([place])   . Constipation   . COPD (chronic obstructive pulmonary disease) ([facility])    ongoing tobacco   . CVA (cerebral infarction)   . DM2 (diabetes mellitus, type 2) ([place])   . Dyslipidemia   . Dysphagia   . Glaucoma   . HTN (hypertension)    Past Surgical History:  Procedure Laterality Date  . CARDIAC CATHETERIZATION    . COLONOSCOPY N/A 05/14/2015   POOR PREP  . COLONOSCOPY N/A 06/02/2015   SLF: 1. four colorectal polyps removed. no source for anemia identified. 2. the left colon is redundant 3. moderate sized internal hemorroids.   . ESOPHAGOGASTRODUODENOSCOPY N/A 05/14/2015   CHRONIC GASTRITIS  . GIVENS CAPSULE STUDY N/A 06/17/2015   Procedure: GIVENS CAPSULE STUDY;  Surgeon: Danie Binder, MD;  [facility];  Service: Endoscopy;  Laterality: N/A;  0800    No Known Allergies  Outpatient Encounter Medications as of 06/16/2017  Medication Sig  . amLODipine (NORVASC) 2.5 MG tablet Take 5 mg by mouth daily.   . Calcium Carbonate-Vitamin D (OSCAL 500/200 D-3 PO) 1 Tablet once a day by mouth . Can't be given with iron  . clopidogrel (PLAVIX) 75 MG tablet Take 75 mg  by mouth daily.  Marland Kitchen docusate sodium (COLACE) 100 MG capsule Take 100 mg by mouth 2 (two) times daily.  . ferrous sulfate (QC FERROUS SULFATE) 325 (65 FE) MG tablet Take 325 mg by mouth 2 (two) times daily with a meal.  . labetalol (NORMODYNE) 200 MG tablet Take 200 mg by mouth 3 (three) times daily. Reported on 10/22/2015  . linaclotide (LINZESS) 72 MCG capsule Take 72 mcg by mouth daily before breakfast.  . lisinopril (PRINIVIL,ZESTRIL) 40 MG tablet Take 40 mg by mouth daily.  . metFORMIN (GLUCOPHAGE) 500 MG tablet Take 500 mg by mouth 2 (two) times  daily with a meal.   . Multiple Vitamin (MULTIVITAMIN) tablet Take 1 tablet by mouth daily.  . Omega-3 Fatty Acids (FISH OIL) 1000 MG CAPS Take 1,000 mg by mouth 3 (three) times daily.  . pantoprazole (PROTONIX) 40 MG tablet Take 40 mg by mouth daily.  . pravastatin (PRAVACHOL) 80 MG tablet Take 80 mg by mouth daily.  Marland Kitchen senna (SENOKOT) 8.6 MG TABS tablet Take 2 tablets (17.2 mg total) by mouth daily.  . sodium chloride (OCEAN) 0.65 % SOLN nasal spray Place 1 spray into both nostrils 2 (two) times daily.  . tamsulosin (FLOMAX) 0.4 MG CAPS capsule Take 0.4 mg by mouth daily after supper.   No facility-administered encounter medications on file as of 06/16/2017.      Review of Systems  Review of Systems  Constitutional: Negative for activity change, appetite change, chills, diaphoresis, fatigue and fever.  HENT: Negative for mouth sores, postnasal drip, rhinorrhea, sinus pain and sore throat.   Respiratory: Negative for apnea, cough, chest tightness, shortness of breath and wheezing.   Cardiovascular: Negative for chest pain, palpitations and leg swelling.  Gastrointestinal: Negative for abdominal distention, abdominal pain, constipation, diarrhea, nausea and vomiting.  Genitourinary: Negative for dysuria and frequency.  Musculoskeletal: Negative for arthralgias, joint swelling and myalgias.  Skin: Negative for rash.  Neurological: Negative for dizziness, syncope, weakness, light-headedness and numbness.  Psychiatric/Behavioral: Negative for behavioral problems, confusion and sleep disturbance.     Immunization History  Administered Date(s) Administered  . Influenza,inj,Quad PF,6+ Mos 03/07/2015  . Influenza-Unspecified 02/03/2016, 02/04/2017  . PPD Test 03/10/2015, 03/24/2015  . Pneumococcal Conjugate-13 02/04/2017  . Pneumococcal-Unspecified 02/10/2016  . Tdap 01/13/2017   Pertinent  Health Maintenance Due  Topic Date Due  . URINE MICROALBUMIN  06/25/2017  . HEMOGLOBIN A1C   09/12/2017  . FOOT EXAM  01/26/2018  . OPHTHALMOLOGY EXAM  02/23/2018  . INFLUENZA VACCINE  Completed  . PNA vac Low Risk Adult  Completed   Fall Risk  01/10/2017  Falls in the past year? No   Functional Status Survey:    Vitals:   06/16/17 0928  BP: 121/62  Pulse: 75  Resp: (!) 22  Temp: (!) 97.2 F (36.2 C)  TempSrc: Oral  SpO2: 97%  Weight: 187 lb 9.6 oz (85.1 kg)  Height: 5\' 9"  (1.753 m)   Body mass index is 27.7 kg/m. Physical Exam  Constitutional: He appears well-developed and well-nourished.  HENT:  Head: Normocephalic.  Neck: Neck supple.  Cardiovascular: Normal rate and normal heart sounds.  Pulmonary/Chest: Effort normal and breath sounds normal. No respiratory distress. He has no rales.  Abdominal: Soft. Bowel sounds are normal. He exhibits no distension. There is no tenderness. There is no rebound.  Musculoskeletal: He exhibits no edema.  Neurological: He is alert.  Skin: Skin is warm and dry.  Psychiatric: He has a normal mood and affect.  His behavior is normal.    Labs reviewed: Recent Labs    10/23/16 0420 12/07/16 0700 03/15/17 0400  NA 142 139 139  K 4.5 4.0 3.8  CL 109 106 106  CO2 22 25 25   GLUCOSE 106* 79 111*  BUN 18 15 14   CREATININE 0.99 0.92 0.81  CALCIUM 9.6 9.1 9.2   Recent Labs    06/25/16 0700 07/26/16 0700 08/16/16 1430  AST  --  17 21  ALT  --  13* 18  ALKPHOS  --  53 54  BILITOT  --  0.5 0.2*  PROT  --  6.9 6.8  ALBUMIN 3.9 3.4* 3.4*   Recent Labs    08/16/16 1430 08/31/16 0700 10/23/16 0420 12/07/16 0700 03/15/17 0400  WBC 10.2 8.6 7.1 8.0 8.6  NEUTROABS 7.1 5.3 4.0  --   --   HGB 11.4* 12.0* 12.0* 11.9* 11.8*  HCT 33.3* 36.9* 36.0* 35.8* 35.8*  MCV 84.9 86.6 84.9 85.6 85.2  PLT 276 357 260 289 279   Lab Results  Component Value Date   TSH 1.064 10/31/2015   Lab Results  Component Value Date   HGBA1C 7.3 (H) 03/15/2017   Lab Results  Component Value Date   CHOL 126 12/07/2016   HDL 38 (L)  12/07/2016   LDLCALC 68 12/07/2016   TRIG 98 12/07/2016   CHOLHDL 3.3 12/07/2016    Significant Diagnostic Results in last 30 days:  No results found.  Assessment/Plan Essential hypertension Blood pressure controlled On Norvasc, labetalol and lisinopril Diabetes with peripheral vascular complications and retinopathy A1c 7. 3 in 11/18 Blood sugars running less than 150 Continue on Glucophage Repeat A1c Coronary artery disease Stable on Plavix, beta-blocker and statin Constipation On Linzess and senna Iron deficiency anemia due to chronic blood loss Hemoglobin stable On iron.  Will repeat CBC Hyperlipidemia LDL 68 in 08/18  On statin Repeat fasting lipid profile Status post CVA Continue Plavix Eye congestion Possible irritation follows with Ophthalmologist. Per their Note Patient does have Diabetic retinopathy. Needs Good control of Diabetes.  Family/ staff Communication:   Labs/tests ordered:

## 2017-06-21 ENCOUNTER — Encounter (HOSPITAL_COMMUNITY)
Admission: RE | Admit: 2017-06-21 | Discharge: 2017-06-21 | Disposition: A | Discharge status: Home / Self Care | Payer: Medicare Other | Source: Skilled Nursing Facility | Attending: Internal Medicine | Admitting: Internal Medicine

## 2017-06-21 DIAGNOSIS — D500 Iron deficiency anemia secondary to blood loss (chronic): Secondary | ICD-10-CM | POA: Diagnosis not present

## 2017-06-21 DIAGNOSIS — M6281 Muscle weakness (generalized): Secondary | ICD-10-CM | POA: Diagnosis not present

## 2017-06-21 DIAGNOSIS — I639 Cerebral infarction, unspecified: Secondary | ICD-10-CM | POA: Diagnosis not present

## 2017-06-21 DIAGNOSIS — N4289 Other specified disorders of prostate: Secondary | ICD-10-CM | POA: Diagnosis not present

## 2017-06-21 DIAGNOSIS — K559 Vascular disorder of intestine, unspecified: Secondary | ICD-10-CM | POA: Diagnosis not present

## 2017-06-21 DIAGNOSIS — E119 Type 2 diabetes mellitus without complications: Secondary | ICD-10-CM | POA: Insufficient documentation

## 2017-06-21 LAB — CBC
HEMATOCRIT: 34.6 % — AB (ref 39.0–52.0)
HEMOGLOBIN: 11 g/dL — AB (ref 13.0–17.0)
MCH: 27.2 pg (ref 26.0–34.0)
MCHC: 31.8 g/dL (ref 30.0–36.0)
MCV: 85.4 fL (ref 78.0–100.0)
Platelets: 300 10*3/uL (ref 150–400)
RBC: 4.05 MIL/uL — ABNORMAL LOW (ref 4.22–5.81)
RDW: 13.6 % (ref 11.5–15.5)
WBC: 10.7 10*3/uL — AB (ref 4.0–10.5)

## 2017-06-21 LAB — BASIC METABOLIC PANEL
ANION GAP: 12 (ref 5–15)
BUN: 15 mg/dL (ref 6–20)
CALCIUM: 9.1 mg/dL (ref 8.9–10.3)
CHLORIDE: 106 mmol/L (ref 101–111)
CO2: 22 mmol/L (ref 22–32)
Creatinine, Ser: 0.8 mg/dL (ref 0.61–1.24)
GFR calc non Af Amer: >60 mL/min (ref >60)
Glucose, Bld: 124 mg/dL — ABNORMAL HIGH (ref 65–99)
Potassium: 4 mmol/L (ref 3.5–5.1)
Sodium: 140 mmol/L (ref 135–145)

## 2017-06-21 LAB — LIPID PANEL
CHOL/HDL RATIO: 3.3 ratio
CHOLESTEROL: 113 mg/dL (ref 0–200)
HDL: 34 mg/dL — ABNORMAL LOW (ref >40)
LDL Cholesterol: 63 mg/dL (ref 0–99)
Triglycerides: 81 mg/dL (ref <150)
VLDL: 16 mg/dL (ref 0–40)

## 2017-06-21 LAB — HEMOGLOBIN A1C
Hgb A1c MFr Bld: 7.3 % — ABNORMAL HIGH (ref 4.8–5.6)
MEAN PLASMA GLUCOSE: 162.81 mg/dL

## 2017-07-05 ENCOUNTER — Encounter (HOSPITAL_COMMUNITY)
Admission: RE | Admit: 2017-07-05 | Discharge: 2017-07-05 | Disposition: A | Discharge status: Home / Self Care | Payer: Medicare Other | Source: Skilled Nursing Facility | Attending: Internal Medicine | Admitting: Internal Medicine

## 2017-07-05 DIAGNOSIS — N4289 Other specified disorders of prostate: Secondary | ICD-10-CM | POA: Insufficient documentation

## 2017-07-05 LAB — COMPREHENSIVE METABOLIC PANEL
ALBUMIN: 3.2 g/dL — AB (ref 3.5–5.0)
ALK PHOS: 62 U/L (ref 38–126)
ALT: 10 U/L — AB (ref 17–63)
ANION GAP: 11 (ref 5–15)
AST: 13 U/L — ABNORMAL LOW (ref 15–41)
BUN: 12 mg/dL (ref 6–20)
CALCIUM: 8.8 mg/dL — AB (ref 8.9–10.3)
CO2: 23 mmol/L (ref 22–32)
CREATININE: 0.84 mg/dL (ref 0.61–1.24)
Chloride: 105 mmol/L (ref 101–111)
GFR calc Af Amer: >60 mL/min (ref >60)
GFR calc non Af Amer: >60 mL/min (ref >60)
GLUCOSE: 118 mg/dL — AB (ref 65–99)
Potassium: 3.6 mmol/L (ref 3.5–5.1)
SODIUM: 139 mmol/L (ref 135–145)
Total Bilirubin: 0.3 mg/dL (ref 0.3–1.2)
Total Protein: 7 g/dL (ref 6.5–8.1)

## 2017-07-05 LAB — CBC
HCT: 34.2 % — ABNORMAL LOW (ref 39.0–52.0)
HEMOGLOBIN: 10.8 g/dL — AB (ref 13.0–17.0)
MCH: 27 pg (ref 26.0–34.0)
MCHC: 31.6 g/dL (ref 30.0–36.0)
MCV: 85.5 fL (ref 78.0–100.0)
Platelets: 340 10*3/uL (ref 150–400)
RBC: 4 MIL/uL — AB (ref 4.22–5.81)
RDW: 13.9 % (ref 11.5–15.5)
WBC: 11.5 10*3/uL — ABNORMAL HIGH (ref 4.0–10.5)

## 2017-07-11 ENCOUNTER — Non-Acute Institutional Stay (SKILLED_NURSING_FACILITY): Payer: Medicare Other | Admitting: Internal Medicine

## 2017-07-11 ENCOUNTER — Encounter: Payer: Self-pay | Admitting: Internal Medicine

## 2017-07-11 DIAGNOSIS — I10 Essential (primary) hypertension: Secondary | ICD-10-CM

## 2017-07-11 DIAGNOSIS — K5900 Constipation, unspecified: Secondary | ICD-10-CM | POA: Diagnosis not present

## 2017-07-11 DIAGNOSIS — Z8673 Personal history of transient ischemic attack (TIA), and cerebral infarction without residual deficits: Secondary | ICD-10-CM

## 2017-07-11 DIAGNOSIS — I2510 Atherosclerotic heart disease of native coronary artery without angina pectoris: Secondary | ICD-10-CM | POA: Diagnosis not present

## 2017-07-11 DIAGNOSIS — E1151 Type 2 diabetes mellitus with diabetic peripheral angiopathy without gangrene: Secondary | ICD-10-CM

## 2017-07-11 DIAGNOSIS — D500 Iron deficiency anemia secondary to blood loss (chronic): Secondary | ICD-10-CM

## 2017-07-11 NOTE — Progress Notes (Signed)
Location:   [facility] Room Number: 104/W Place of Service:  SNF [phone]) Provider:  Freddi Starr, MD  Patient Care Team: [name], MD as PCP - General (Internal Medicine) Danie Binder, MD as Consulting Physician (Gastroenterology)  Extended Emergency Contact Information Primary Emergency Contact: Dorathy Kinsman States of [place] Phone: [phone] Relation: Daughter Secondary Emergency Contact: [place] Phone: [phone] Relation: Daughter  Code Status:  DNR Goals of care: Advanced Directive information Advanced Directives 07/11/2017  Does Patient Have a Medical Advance Directive? Yes  Type of Advance Directive Out of facility DNR (pink MOST or yellow form)  Does patient want to make changes to medical advance directive? No - Patient declined  Copy of [place] in Chart? No - copy requested  Would patient like information on creating a medical advance directive? -  Pre-existing out of facility DNR order (yellow form or pink MOST form) -    Chief complaint-routine visit for medical management of chronic medical issues including history of anemia with previous history of GI bleed-hypertension-type 2 diabetes-coronary artery disease-hypertension- history CVA-as well as hyperlipidemia-constipation-BPH- and blindness  HPI:  Pt is a 83 y.o. male seen today for medical management of chronic diseases. -- As noted above. He continues to be quite stable he has had some weight gain but this appears to have moderated is actually lost a couple pounds since our last visit.  He continues to snack at times are apparently he has cut back somewhat on this.  Ironically when he first came here he had significant failure to thrive issues which obviously have resolved.  He continues to be pleasant alert gregarious.  In regards to coronary artery disease he has a significant  history here with CABG in 2000 and PCI of the left main coronary artery back in 2003 also has complete occlusion of the right carotid artery-he is on Plavix statin and a beta-blocker and has been essentially asymptomatic which is encouraging.  In regards to hypertension he is on Norvasc lisinopril labetalol at times I will see listed systolics in the 740C I took it manually today and got 138/56 I suspect when he gets his medications it comes down.  He does have a previous history of GI bleed thought most likely due to AVM malformations hemoglobin has now been stable for some time most recently 10.8 and update CBC is pending he is on iron and a proton pump inhibitor.  Regards to CVA he continues on Plavix is doing well with supportive care he has minimal residual deficits  He does have a history of being legally blind he is able to see some shadows and objects it appears with his right eye.  He is followed by ophthalmology thought to have some element of diabetic retinopathy.  He is a type II diabetic on Glucophage blood sugars appear to be mainly in the low to higher 100s - hemoglobin A1c was 7.3 most recently which is stable with the previous hemoglobin A1c  Again he has quite a good appetite and I suspect there are some dietary indiscretions at times in regards to his diabetes  Currently he is sitting in his wheelchair comfortably visiting with other residents which is his baseline he has no complaints does not complain of any shortness of breath chest pain or body aches at this time-he continues to seem to really enjoy life       Past Medical  History:  Diagnosis Date  . Anemia due to chronic blood loss   . CAD (coronary artery disease)    cath 11/2007: 70% LM ISR, SVG-LAD OK, SVG-D1 100%, IMA-D2 OK, SVG-OM OK, 4-vessel CABG 2000.  Marland Kitchen CAD (coronary artery disease)    s/p of 95% L main artery stenosis, 4/03: cutting balloon PTCA of 80% in-stent restenosis, 7/04. preserved L ventriculr  function  . Carotid bruit    bilateral. no sigificant distal abd atherosclerosis by recent cath.   . Chronic bronchitis ([place])   . Constipation   . COPD (chronic obstructive pulmonary disease) (HCC)    ongoing tobacco   . CVA (cerebral infarction)   . DM2 (diabetes mellitus, type 2) ([place])   . Dyslipidemia   . Dysphagia   . Glaucoma   . HTN (hypertension)    Past Surgical History:  Procedure Laterality Date  . CARDIAC CATHETERIZATION    . COLONOSCOPY N/A 05/14/2015   POOR PREP  . COLONOSCOPY N/A 06/02/2015   SLF: 1. four colorectal polyps removed. no source for anemia identified. 2. the left colon is redundant 3. moderate sized internal hemorroids.   . ESOPHAGOGASTRODUODENOSCOPY N/A 05/14/2015   CHRONIC GASTRITIS  . GIVENS CAPSULE STUDY N/A 06/17/2015   Procedure: GIVENS CAPSULE STUDY;  Surgeon: Danie Binder, MD;  Location[facility];  Service: Endoscopy;  Laterality: N/A;  0800    No Known Allergies  Outpatient Encounter Medications as of 07/11/2017  Medication Sig  . amLODipine (NORVASC) 2.5 MG tablet Take 5 mg by mouth daily.   . Calcium Carbonate-Vitamin D (OSCAL 500/200 D-3 PO) 1 Tablet once a day by mouth . Can't be given with iron  . clopidogrel (PLAVIX) 75 MG tablet Take 75 mg by mouth daily.  Marland Kitchen docusate sodium (COLACE) 100 MG capsule Take 100 mg by mouth 2 (two) times daily.  . ferrous sulfate (QC FERROUS SULFATE) 325 (65 FE) MG tablet Take 325 mg by mouth daily.   Marland Kitchen labetalol (NORMODYNE) 200 MG tablet Take 200 mg by mouth 3 (three) times daily. Reported on 10/22/2015  . linaclotide (LINZESS) 72 MCG capsule Take 72 mcg by mouth daily before breakfast.  . lisinopril (PRINIVIL,ZESTRIL) 40 MG tablet Take 40 mg by mouth daily.  . metFORMIN (GLUCOPHAGE) 500 MG tablet Take 500 mg by mouth 2 (two) times daily with a meal.   . Multiple Vitamin (MULTIVITAMIN) tablet Take 1 tablet by mouth daily.  . Omega-3 Fatty Acids (FISH OIL) 1000 MG CAPS Take 1,000 mg by mouth 3 (three)  times daily.  . pantoprazole (PROTONIX) 40 MG tablet Take 40 mg by mouth daily.  . pravastatin (PRAVACHOL) 80 MG tablet Take 80 mg by mouth daily.  Marland Kitchen senna (SENOKOT) 8.6 MG TABS tablet Take 2 tablets (17.2 mg total) by mouth daily.  . sodium chloride (OCEAN) 0.65 % SOLN nasal spray Place 1 spray into both nostrils 2 (two) times daily.  . tamsulosin (FLOMAX) 0.4 MG CAPS capsule Take 0.4 mg by mouth daily after supper.   No facility-administered encounter medications on file as of 07/11/2017.      Review of Systems   In general is not complaining of fever chills his weight appears to have moderated.  Skin is not complain of rashes or itching.  Head ears eyes nose mouth and throat not complaining of any visual changes from baseline he is legally blind does see some shadows with his right eye he is not complaining of eye pain or difficulty swallowing or sore throat.  Respiratory is not complaining shortness of breath or cough.  Cardiac denies any chest pain has fairly minimal lower extremity edema baseline.  GI  Is not complaining of any abdominal discomfort nausea vomiting diarrhea constipation continues to have a very good appetite.  GU is not complaining of any dysuria does have a history of BPH on Flomax musculoskeletal does not complain of joint pain.  Neurologic is not complain dizziness headache syncope  Does have history CVA   Psych he appears largely alert and oriented no evidence of anxiety or depression of note manual blood pressure today was 138/56  Immunization History  Administered Date(s) Administered  . Influenza,inj,Quad PF,6+ Mos 03/07/2015  . Influenza-Unspecified 02/03/2016, 02/04/2017  . PPD Test 03/10/2015, 03/24/2015  . Pneumococcal Conjugate-13 02/04/2017  . Pneumococcal-Unspecified 02/10/2016  . Tdap 01/13/2017   Pertinent  Health Maintenance Due  Topic Date Due  . URINE MICROALBUMIN  08/11/2017 (Originally 06/25/2017)  . HEMOGLOBIN A1C  12/19/2017    . FOOT EXAM  01/26/2018  . OPHTHALMOLOGY EXAM  02/23/2018  . INFLUENZA VACCINE  Completed  . PNA vac Low Risk Adult  Completed   Fall Risk  01/10/2017  Falls in the past year? No   Functional Status Survey:    Vitals:   07/11/17 1303  BP: (!) 147/73  Pulse: 66  Resp: 18  Temp: 98.2 F (36.8 C)  TempSrc: Oral  SpO2: 94%  Weight: 184 lb 12.8 oz (83.8 kg)  Height: 5\' 9"  (1.753 m)  --- Of note manual blood pressure today was 138/56 Body mass index is 27.29 kg/m. Physical Exam  In general this is a very pleasant elderly male in no distress sitting comfortably in his wheelchair.  His skin is warm and dry.  Eyes appears to have opacity of his left eye right eye sclera conjunctive are clear he is able to see some shadows and outlines.  I do not note any drainage or discharge.  Oropharynx is clear mucous membranes moist tongue is midline with full range of motion.  Chest is clear to auscultation there is no labored breathing.  Heart is regular rate and rhythm without murmur gallop rub he has quite mild baseline lower extremity edema.  His abdomen is soft somewhat protuberant with positive bowel sounds.  Musculoskeletal does move all extremities x4 largely ambulates in a wheelchair has some history of very mild left-sided weakness but this is fairly minimal and does have a slight mouth droop  Neurologic he is alert please see  musculoskeletal section  above-his speech is clear  Psych he is largely alert and oriented pleasant appropriate gregarious which is his baseline  Labs reviewed: Recent Labs    03/15/17 0400 06/21/17 0743 07/05/17 0704  NA 139 140 139  K 3.8 4.0 3.6  CL 106 106 105  CO2 25 22 23   GLUCOSE 111* 124* 118*  BUN 14 15 12   CREATININE 0.81 0.80 0.84  CALCIUM 9.2 9.1 8.8*   Recent Labs    07/26/16 0700 08/16/16 1430 07/05/17 0704  AST 17 21 13*  ALT 13* 18 10*  ALKPHOS 53 54 62  BILITOT 0.5 0.2* 0.3  PROT 6.9 6.8 7.0  ALBUMIN 3.4* 3.4*  3.2*   Recent Labs    08/16/16 1430 08/31/16 0700 10/23/16 0420  03/15/17 0400 06/21/17 0743 07/05/17 0704  WBC 10.2 8.6 7.1   < > 8.6 10.7* 11.5*  NEUTROABS 7.1 5.3 4.0  --   --   --   --   HGB 11.4*  12.0* 12.0*   < > 11.8* 11.0* 10.8*  HCT 33.3* 36.9* 36.0*   < > 35.8* 34.6* 34.2*  MCV 84.9 86.6 84.9   < > 85.2 85.4 85.5  PLT 276 357 260   < > 279 300 340   < > = values in this interval not displayed.   Lab Results  Component Value Date   TSH 1.064 10/31/2015   Lab Results  Component Value Date   HGBA1C 7.3 (H) 06/21/2017   Lab Results  Component Value Date   CHOL 113 06/21/2017   HDL 34 (L) 06/21/2017   LDLCALC 63 06/21/2017   TRIG 81 06/21/2017   CHOLHDL 3.3 06/21/2017    Significant Diagnostic Results in last 30 days:  No results found.  Assessment/Plan  #1 history of GI bleed anemia most likely due to AVM malformations-this is quite stable he is on iron as well as a PPI will update a CBC.  Last hemoglobin of 10.8 shows relative stability.  2.  History of hypertension he has some variability here but manual readings are reassuring I suspect when he gets his medications his blood pressure comes down-at this point will monitor he is on Norvasc lisinopril and labetalol.  3.  History of type 2 diabetes this appears relatively stable blood sugars are largely in the lower mid 100s with occasional spikes into the higher 100s in the morning-hemoglobin A1c of 7.3 is relatively stable we will continue to monitor he is on Glucophage.  4.  History CVA again has minimal residual deficits he is on Plavix.  5.  History of coronary artery disease with previous history of CABG and stenting as well as carotid artery stenosis with complete occlusion of the right CA--again he is on Plavix he is also on a statin as well as labetalol.  LDL was 63 on lab done in February 2019.  6.  History of hyperlipidemia as noted above he is on a statin as well as fish oil LDL was 63 on last  month's lab.  7.  History of constipation at one point this was thought to be significant issue by GI and was started on Linzess this appears to be stable currently he is on senna and Colace in addition to the [place].  8.  History of BPH this is been largely stable on the Flomax is not really been an issue with some.  9.  History of issues with suspected diabetic retinopathy he does have legal blindness he is able to see somewhat with his right eye he appears to be doing well with supportive care and is followed by ophthalmology.   #10 question minimal leukocytosis most recent CBC white count was minimally elevated at 11.5 again will update this he does not show any signs of infection at this time denies dysuria shortness of breath cough diarrhea--fever or chills  [id number]-of note greater than 35 minutes spent assessing patient reviewing  his chart labs and coordinating and formulating plan of care for numerous diagnoses-note   greater than 50% of time spent coordinating plan of care

## 2017-07-12 ENCOUNTER — Encounter (HOSPITAL_COMMUNITY)
Admission: RE | Admit: 2017-07-12 | Discharge: 2017-07-12 | Disposition: A | Discharge status: Home / Self Care | Payer: Medicare Other | Source: Skilled Nursing Facility | Attending: *Deleted | Admitting: *Deleted

## 2017-07-12 ENCOUNTER — Encounter: Payer: Self-pay | Admitting: Internal Medicine

## 2017-07-12 ENCOUNTER — Non-Acute Institutional Stay (SKILLED_NURSING_FACILITY): Payer: Medicare Other | Admitting: Internal Medicine

## 2017-07-12 DIAGNOSIS — J189 Pneumonia, unspecified organism: Secondary | ICD-10-CM

## 2017-07-12 DIAGNOSIS — I10 Essential (primary) hypertension: Secondary | ICD-10-CM | POA: Diagnosis not present

## 2017-07-12 DIAGNOSIS — R079 Chest pain, unspecified: Secondary | ICD-10-CM | POA: Diagnosis not present

## 2017-07-12 DIAGNOSIS — I2510 Atherosclerotic heart disease of native coronary artery without angina pectoris: Secondary | ICD-10-CM | POA: Diagnosis not present

## 2017-07-12 DIAGNOSIS — K5900 Constipation, unspecified: Secondary | ICD-10-CM | POA: Diagnosis not present

## 2017-07-12 DIAGNOSIS — R109 Unspecified abdominal pain: Secondary | ICD-10-CM | POA: Diagnosis not present

## 2017-07-12 DIAGNOSIS — N4289 Other specified disorders of prostate: Secondary | ICD-10-CM | POA: Diagnosis not present

## 2017-07-12 DIAGNOSIS — R1030 Lower abdominal pain, unspecified: Secondary | ICD-10-CM

## 2017-07-12 LAB — COMPREHENSIVE METABOLIC PANEL
ALT: 9 U/L — ABNORMAL LOW (ref 17–63)
ANION GAP: 13 (ref 5–15)
AST: 16 U/L (ref 15–41)
Albumin: 3.2 g/dL — ABNORMAL LOW (ref 3.5–5.0)
Alkaline Phosphatase: 62 U/L (ref 38–126)
BUN: 15 mg/dL (ref 6–20)
CALCIUM: 9.1 mg/dL (ref 8.9–10.3)
CHLORIDE: 106 mmol/L (ref 101–111)
CO2: 20 mmol/L — AB (ref 22–32)
Creatinine, Ser: 1.05 mg/dL (ref 0.61–1.24)
Glucose, Bld: 164 mg/dL — ABNORMAL HIGH (ref 65–99)
Potassium: 4.4 mmol/L (ref 3.5–5.1)
SODIUM: 139 mmol/L (ref 135–145)
Total Bilirubin: 0.4 mg/dL (ref 0.3–1.2)
Total Protein: 6.8 g/dL (ref 6.5–8.1)

## 2017-07-12 LAB — URINALYSIS, ROUTINE W REFLEX MICROSCOPIC
Bilirubin Urine: NEGATIVE
GLUCOSE, UA: NEGATIVE mg/dL
HGB URINE DIPSTICK: NEGATIVE
Ketones, ur: NEGATIVE mg/dL
NITRITE: NEGATIVE
PROTEIN: 30 mg/dL — AB
Specific Gravity, Urine: 1.016 (ref 1.005–1.030)
pH: 8 (ref 5.0–8.0)

## 2017-07-12 LAB — CBC WITH DIFFERENTIAL/PLATELET
Basophils Absolute: 0.1 10*3/uL (ref 0.0–0.1)
Basophils Relative: 0 %
EOS ABS: 1.2 10*3/uL — AB (ref 0.0–0.7)
Eosinophils Relative: 10 %
HCT: 35 % — ABNORMAL LOW (ref 39.0–52.0)
Hemoglobin: 11 g/dL — ABNORMAL LOW (ref 13.0–17.0)
LYMPHS ABS: 1.9 10*3/uL (ref 0.7–4.0)
Lymphocytes Relative: 16 %
MCH: 26.5 pg (ref 26.0–34.0)
MCHC: 31.4 g/dL (ref 30.0–36.0)
MCV: 84.3 fL (ref 78.0–100.0)
MONO ABS: 0.9 10*3/uL (ref 0.1–1.0)
Monocytes Relative: 8 %
Neutro Abs: 7.8 10*3/uL — ABNORMAL HIGH (ref 1.7–7.7)
Neutrophils Relative %: 66 %
Platelets: 340 10*3/uL (ref 150–400)
RBC: 4.15 MIL/uL — ABNORMAL LOW (ref 4.22–5.81)
RDW: 13.7 % (ref 11.5–15.5)
WBC: 12 10*3/uL — AB (ref 4.0–10.5)

## 2017-07-12 NOTE — Progress Notes (Signed)
Location:   [facility] Room Number: 104/W Place of Service:  SNF (31) Provider:  Clydene Fake, MD  Patient Care Team: [name], MD as PCP - General (Internal Medicine) Danie Binder, MD as Consulting Physician (Gastroenterology)  Extended Emergency Contact Information Primary Emergency Contact: Dorathy Kinsman States of [place] Phone: [phone] Relation: Daughter Secondary Emergency Contact: [place] of [place] Phone: [phone] Relation: Daughter  Code Status:  DNR Goals of care: Advanced Directive information Advanced Directives 07/12/2017  Does Patient Have a Medical Advance Directive? Yes  Type of Advance Directive Out of facility DNR (pink MOST or yellow form)  Does patient want to make changes to medical advance directive? No - Patient declined  Copy of [place] in Chart? No - copy requested  Would patient like information on creating a medical advance directive? -  Pre-existing out of facility DNR order (yellow form or pink MOST form) -     Chief Complaint  Patient presents with  . Acute Visit    Patients c/o Pain in right shoulder and Abdominal area    HPI:  Pt is a 83 y.o. male seen today for an acute visit for not feeling well.   Patient has H/O GI bleed, Diabetes type 2 ,S/P CVA , Hypertension, Hyperlipidemia, constipation. Poor vision . CAD S/P CABG in 2000 and PCI of Left Main on 2003 and Carotid artery stenosis.with complete occlusion of RCA. Patient this morning was c/o not feeling well. He is very non specific in his complains. Says he was hurting in right side of chest. Denies any Cough or SOB. Also C/o Lower abdominal Pain. He did have good bowel movement today. Does not have any Nausea or vomiting. No Fever or chills.He also c/o feeling weak and not having much Appetite.  Past Medical History:  Diagnosis Date  . Anemia due to chronic blood  loss   . CAD (coronary artery disease)    cath 11/2007: 70% LM ISR, SVG-LAD OK, SVG-D1 100%, IMA-D2 OK, SVG-OM OK, 4-vessel CABG 2000.  Marland Kitchen CAD (coronary artery disease)    s/p of 95% L main artery stenosis, 4/03: cutting balloon PTCA of 80% in-stent restenosis, 7/04. preserved L ventriculr function  . Carotid bruit    bilateral. no sigificant distal abd atherosclerosis by recent cath.   . Chronic bronchitis ([place])   . Constipation   . COPD (chronic obstructive pulmonary disease) (HCC)    ongoing tobacco   . CVA (cerebral infarction)   . DM2 (diabetes mellitus, type 2) ([place])   . Dyslipidemia   . Dysphagia   . Glaucoma   . HTN (hypertension)    Past Surgical History:  Procedure Laterality Date  . CARDIAC CATHETERIZATION    . COLONOSCOPY N/A 05/14/2015   POOR PREP  . COLONOSCOPY N/A 06/02/2015   SLF: 1. four colorectal polyps removed. no source for anemia identified. 2. the left colon is redundant 3. moderate sized internal hemorroids.   . ESOPHAGOGASTRODUODENOSCOPY N/A 05/14/2015   CHRONIC GASTRITIS  . GIVENS CAPSULE STUDY N/A 06/17/2015   Procedure: GIVENS CAPSULE STUDY;  Surgeon: Danie Binder, MD;  [facility];  Service: Endoscopy;  Laterality: N/A;  0800    No Known Allergies  Outpatient Encounter Medications as of 07/12/2017  Medication Sig  . amLODipine (NORVASC) 2.5 MG tablet Take 5 mg by mouth daily.   . Calcium Carbonate-Vitamin D (OSCAL 500/200 D-3 PO) 1 Tablet  once a day by mouth . Can't be given with iron  . clopidogrel (PLAVIX) 75 MG tablet Take 75 mg by mouth daily.  Marland Kitchen docusate sodium (COLACE) 100 MG capsule Take 100 mg by mouth 2 (two) times daily.  . ferrous sulfate (QC FERROUS SULFATE) 325 (65 FE) MG tablet Take 325 mg by mouth daily.   Marland Kitchen labetalol (NORMODYNE) 200 MG tablet Take 200 mg by mouth 3 (three) times daily. Reported on 10/22/2015  . linaclotide (LINZESS) 72 MCG capsule Take 72 mcg by mouth daily before breakfast.  . lisinopril (PRINIVIL,ZESTRIL)  40 MG tablet Take 40 mg by mouth daily.  . metFORMIN (GLUCOPHAGE) 500 MG tablet Take 500 mg by mouth 2 (two) times daily with a meal.   . Multiple Vitamin (MULTIVITAMIN) tablet Take 1 tablet by mouth daily.  . Omega-3 Fatty Acids (FISH OIL) 1000 MG CAPS Take 1,000 mg by mouth 3 (three) times daily.  . pantoprazole (PROTONIX) 40 MG tablet Take 40 mg by mouth daily.  . pravastatin (PRAVACHOL) 80 MG tablet Take 80 mg by mouth daily.  Marland Kitchen senna (SENOKOT) 8.6 MG TABS tablet Take 2 tablets (17.2 mg total) by mouth daily.  . tamsulosin (FLOMAX) 0.4 MG CAPS capsule Take 0.4 mg by mouth daily after supper.  . [DISCONTINUED] sodium chloride (OCEAN) 0.65 % SOLN nasal spray Place 1 spray into both nostrils 2 (two) times daily.   No facility-administered encounter medications on file as of 07/12/2017.      Review of Systems  Constitutional: Positive for appetite change. Negative for chills and fever.  HENT: Negative.   Respiratory: Negative.   Cardiovascular: Positive for chest pain.  Gastrointestinal: Positive for abdominal pain.  Genitourinary: Positive for dysuria.  Musculoskeletal: Negative.   Skin: Negative.   Neurological: Negative.   Psychiatric/Behavioral: Negative.     Immunization History  Administered Date(s) Administered  . Influenza,inj,Quad PF,6+ Mos 03/07/2015  . Influenza-Unspecified 02/03/2016, 02/04/2017  . PPD Test 03/10/2015, 03/24/2015  . Pneumococcal Conjugate-13 02/04/2017  . Pneumococcal-Unspecified 02/10/2016  . Tdap 01/13/2017   Pertinent  Health Maintenance Due  Topic Date Due  . URINE MICROALBUMIN  08/11/2017 (Originally 06/25/2017)  . HEMOGLOBIN A1C  12/19/2017  . FOOT EXAM  01/26/2018  . OPHTHALMOLOGY EXAM  02/23/2018  . INFLUENZA VACCINE  Completed  . PNA vac Low Risk Adult  Completed   Fall Risk  01/10/2017  Falls in the past year? No   Functional Status Survey:    Vitals:   07/12/17 1116  BP: (!) 142/65  Pulse: 60  Resp: 18  Temp: (!) 97.5 F  (36.4 C)  TempSrc: Oral  SpO2: 96%   There is no height or weight on file to calculate BMI. Physical Exam  Constitutional: He appears well-developed and well-nourished.  HENT:  Head: Normocephalic.  Mouth/Throat: Oropharynx is clear and moist.  Eyes: Pupils are equal, round, and reactive to light.  Neck: Neck supple.  Cardiovascular: Normal rate and normal heart sounds.  No murmur heard. Pulmonary/Chest: Effort normal and breath sounds normal. No respiratory distress. He has no wheezes. He has no rales.  Abdominal: Soft. Bowel sounds are normal. He exhibits no distension. There is no tenderness.  Some tenderness in Lower Abdomen  Musculoskeletal: He exhibits no edema.  Neurological: He is alert.  Follows commands. No New deficits noted  Skin: Skin is warm and dry.  Psychiatric: He has a normal mood and affect. His behavior is normal. Thought content normal.    Labs reviewed: Recent Labs  03/15/17 0400 06/21/17 0743 07/05/17 0704  NA 139 140 139  K 3.8 4.0 3.6  CL 106 106 105  CO2 25 22 23   GLUCOSE 111* 124* 118*  BUN 14 15 12   CREATININE 0.81 0.80 0.84  CALCIUM 9.2 9.1 8.8*   Recent Labs    07/26/16 0700 08/16/16 1430 07/05/17 0704  AST 17 21 13*  ALT 13* 18 10*  ALKPHOS 53 54 62  BILITOT 0.5 0.2* 0.3  PROT 6.9 6.8 7.0  ALBUMIN 3.4* 3.4* 3.2*   Recent Labs    08/31/16 0700 10/23/16 0420  06/21/17 0743 07/05/17 0704 07/12/17 0716  WBC 8.6 7.1   < > 10.7* 11.5* 12.0*  NEUTROABS 5.3 4.0  --   --   --  7.8*  HGB 12.0* 12.0*   < > 11.0* 10.8* 11.0*  HCT 36.9* 36.0*   < > 34.6* 34.2* 35.0*  MCV 86.6 84.9   < > 85.4 85.5 84.3  PLT 357 260   < > 300 340 340   < > = values in this interval not displayed.   Lab Results  Component Value Date   TSH 1.064 10/31/2015   Lab Results  Component Value Date   HGBA1C 7.3 (H) 06/21/2017   Lab Results  Component Value Date   CHOL 113 06/21/2017   HDL 34 (L) 06/21/2017   LDLCALC 63 06/21/2017   TRIG 81  06/21/2017   CHOLHDL 3.3 06/21/2017    Significant Diagnostic Results in last 30 days:  No results found.  Assessment/Plan  Chest pain with Non Specific symptoms and Leucocytosis His EKG was unchanged. His symptoms are not Cardiac He does have Leucocytosis Will get Chest Xray to Evaluate for Pneumonia Addendum Chest Xray showed Patchy Bilateral infiltrate Right more then Left Start him On Levaquin 500 mg QD for 7 days POX QD  Abdominal Pain Non Specific Will also Check UA Patient is eating most of his food and did have BM this morning. Will also get Abdominal Plain Xray since he has h/o Constipation Patient  On Linzess and senna  Addendum ruled out obstruction Moderate constipation Linzess  increased  to 145 mcg And start him on Miralax also for few days H/O CAD He is on Plavix , Beta blocker and Statin.  Family/ staff Communication:   Labs/tests ordered:   Total time spent in this patient care encounter was _45 minutes; greater than 50% of the visit spent counseling patient, reviewing records , Labs and coordinating care for problems addressed at this encounter.

## 2017-07-13 ENCOUNTER — Non-Acute Institutional Stay (SKILLED_NURSING_FACILITY): Payer: Medicare Other | Admitting: Internal Medicine

## 2017-07-13 ENCOUNTER — Encounter: Payer: Self-pay | Admitting: Internal Medicine

## 2017-07-13 DIAGNOSIS — E1151 Type 2 diabetes mellitus with diabetic peripheral angiopathy without gangrene: Secondary | ICD-10-CM | POA: Diagnosis not present

## 2017-07-13 DIAGNOSIS — K5900 Constipation, unspecified: Secondary | ICD-10-CM | POA: Diagnosis not present

## 2017-07-13 DIAGNOSIS — J189 Pneumonia, unspecified organism: Secondary | ICD-10-CM

## 2017-07-13 NOTE — Progress Notes (Signed)
Location:   [place] Room Number: 104/W Place of Service:  SNF [phone]) Provider:  Freddi Starr, MD  Patient Care Team: [name], MD as PCP - General (Internal Medicine) Danie Binder, MD as Consulting Physician (Gastroenterology)  Extended Emergency Contact Information Primary Emergency Contact: Dorathy Kinsman States of [place] Phone: [phone] Relation: Daughter Secondary Emergency Contact: [place] of [place] Phone: [phone] Relation: Daughter  Code Status:  DNR Goals of care: Advanced Directive information Advanced Directives 07/13/2017  Does Patient Have a Medical Advance Directive? Yes  Type of Advance Directive Out of facility DNR (pink MOST or yellow form)  Does patient want to make changes to medical advance directive? No - Patient declined  Copy of [place] in Chart? No - copy requested  Would patient like information on creating a medical advance directive? -  Pre-existing out of facility DNR order (yellow form or pink MOST form) -     Chief Complaint  Patient presents with  . Acute Visit    F/U Pneumonia    HPI:  Pt is a 83 y.o. male seen today for an acute visit for follow-up of pneumonia.--Patient apparently was not himself yesterday had vague complaints of right-sided abdominal pain.  He was assessed by Dr. Lyndel Safe and an x-ray was ordered as well as a urinalysis and culture-x-ray did show right greater than left findings compatible with pneumonia-abdominal x-ray did not show bowel obstruction but did show moderate constipation-.  He was started on a 7-day course of Levaquin and also MiraLAX was added to his bowel regimen he is also on Linzess and senna.  Today he appears to be doing significantly better instead of lying in bed he is up in his wheelchair he is vocal alert age his breakfast and lunch-appears to be back at baseline which is  encouraging vital signs are stable he says he feels better he is complaining of shortness of breath or cough says he has had abowel movement--     Past Medical History:  Diagnosis Date  . Anemia due to chronic blood loss   . CAD (coronary artery disease)    cath 11/2007: 70% LM ISR, SVG-LAD OK, SVG-D1 100%, IMA-D2 OK, SVG-OM OK, 4-vessel CABG 2000.  Marland Kitchen CAD (coronary artery disease)    s/p of 95% L main artery stenosis, 4/03: cutting balloon PTCA of 80% in-stent restenosis, 7/04. preserved L ventriculr function  . Carotid bruit    bilateral. no sigificant distal abd atherosclerosis by recent cath.   . Chronic bronchitis ([place])   . Constipation   . COPD (chronic obstructive pulmonary disease) (HCC)    ongoing tobacco   . CVA (cerebral infarction)   . DM2 (diabetes mellitus, type 2) ([place])   . Dyslipidemia   . Dysphagia   . Glaucoma   . HTN (hypertension)    Past Surgical History:  Procedure Laterality Date  . CARDIAC CATHETERIZATION    . COLONOSCOPY N/A 05/14/2015   POOR PREP  . COLONOSCOPY N/A 06/02/2015   SLF: 1. four colorectal polyps removed. no source for anemia identified. 2. the left colon is redundant 3. moderate sized internal hemorroids.   . ESOPHAGOGASTRODUODENOSCOPY N/A 05/14/2015   CHRONIC GASTRITIS  . GIVENS CAPSULE STUDY N/A 06/17/2015   Procedure: GIVENS CAPSULE STUDY;  Surgeon: Danie Binder, MD;  [facility];  Service: Endoscopy;  Laterality: N/A;  0800    No Known Allergies  Outpatient Encounter Medications as of 07/13/2017  Medication Sig  . amLODipine (NORVASC) 2.5 MG tablet Take 5 mg by mouth daily.   . Calcium Carbonate-Vitamin D (OSCAL 500/200 D-3 PO) 1 Tablet once a day by mouth . Can't be given with iron  . clopidogrel (PLAVIX) 75 MG tablet Take 75 mg by mouth daily.  Marland Kitchen docusate sodium (COLACE) 100 MG capsule Take 100 mg by mouth 2 (two) times daily.  . ferrous sulfate (QC FERROUS SULFATE) 325 (65 FE) MG tablet Take 325 mg by mouth daily.     Marland Kitchen labetalol (NORMODYNE) 200 MG tablet Take 200 mg by mouth 3 (three) times daily. Reported on 10/22/2015  . levofloxacin (LEVAQUIN) 500 MG tablet Take 500 mg by mouth daily.  Marland Kitchen linaclotide (LINZESS) 145 MCG CAPS capsule Take 145 mcg by mouth daily before breakfast.  . lisinopril (PRINIVIL,ZESTRIL) 40 MG tablet Take 40 mg by mouth daily.  . metFORMIN (GLUCOPHAGE) 500 MG tablet Take 500 mg by mouth 2 (two) times daily with a meal.   . Multiple Vitamin (MULTIVITAMIN) tablet Take 1 tablet by mouth daily.  . Omega-3 Fatty Acids (FISH OIL) 1000 MG CAPS Take 1,000 mg by mouth 3 (three) times daily.  . pantoprazole (PROTONIX) 40 MG tablet Take 40 mg by mouth daily.  . polyethylene glycol (MIRALAX / GLYCOLAX) packet Take 17 g by mouth daily as needed.  . polyethylene glycol (MIRALAX / GLYCOLAX) packet Take 17 g by mouth daily. Give once a day for 1 week from 07/13/2017-07/19/2017  . pravastatin (PRAVACHOL) 80 MG tablet Take 80 mg by mouth daily.  Marland Kitchen senna (SENOKOT) 8.6 MG TABS tablet Take 2 tablets (17.2 mg total) by mouth daily.  . tamsulosin (FLOMAX) 0.4 MG CAPS capsule Take 0.4 mg by mouth daily after supper.  . [DISCONTINUED] linaclotide (LINZESS) 72 MCG capsule Take 72 mcg by mouth daily before breakfast.   No facility-administered encounter medications on file as of 07/13/2017.     Review of Systems is not complain of any fever chills says he feels better today.  Skin does not complain of diaphoresis rashes or itching.     Head ears eyes nose mouth and throat denies shortness of breath or changes visually from baseline he is legally blind.  Respiratory denies shortness of breath or cough today.  Cardiac is not complaining of chest pain has mild lower extremity edema at baseline.  GI is not complaining of abdominal pain today again x-ray did show mild constipation he apparently has had a bowel movement.  GU is not complaining of dysuria urine culture is pending.  Musculoskeletal is not  complaining of joint pain today.  Neurologic does not complain of dizziness headache or syncope.  And psych does not complain of any anxiety or depression says he is feeling better than he did yesterday  Immunization History  Administered Date(s) Administered  . Influenza,inj,Quad PF,6+ Mos 03/07/2015  . Influenza-Unspecified 02/03/2016, 02/04/2017  . PPD Test 03/10/2015, 03/24/2015  . Pneumococcal Conjugate-13 02/04/2017  . Pneumococcal-Unspecified 02/10/2016  . Tdap 01/13/2017   Pertinent  Health Maintenance Due  Topic Date Due  . URINE MICROALBUMIN  08/11/2017 (Originally 06/25/2017)  . HEMOGLOBIN A1C  12/19/2017  . FOOT EXAM  01/26/2018  . OPHTHALMOLOGY EXAM  02/23/2018  . INFLUENZA VACCINE  Completed  . PNA vac Low Risk Adult  Completed   Fall Risk  01/10/2017  Falls in the past year? No   Functional Status Survey:    Vitals:   07/13/17 1056  BP:  100/63  Pulse: 66  Resp: 18  Temp: 98.3 F (36.8 C)  TempSrc: Oral  SpO2: 93%    Physical Exam   In general this is a pleasant elderly male in no distress sitting comfortably in his wheelchair he appears to be back at his baseline gregarious talkative.  Skin is warm and dry is not diaphoretic.  Eyes she is legally blind he does have a left eye opacity which is chronic.  Oropharynx is clear mucous membranes moist.  Chest is clear to auscultation there is no labored breathing there is somewhat shallow air entry.  Heart is regular rate and rhythm without murmur gallop or rub he has mild lower extremity edema at baseline.  His abdomen is soft nontender with active bowel sounds today.  Musculoskeletal moves all extremities at baseline has a history of very mild left-sided weakness but does ambulate in a wheelchair.  He has a slight mouth droop which is baseline as well.  Neurologic as noted above speech is clear.  Psych he is largely alert and oriented pleasant appropriate quite gregarious and vocal which is his  baseline  oLabs reviewed: Recent Labs    06/21/17 0743 07/05/17 0704 07/12/17 1420  NA 140 139 139  K 4.0 3.6 4.4  CL 106 105 106  CO2 22 23 20*  GLUCOSE 124* 118* 164*  BUN 15 12 15   CREATININE 0.80 0.84 1.05  CALCIUM 9.1 8.8* 9.1   Recent Labs    08/16/16 1430 07/05/17 0704 07/12/17 1420  AST 21 13* 16  ALT 18 10* 9*  ALKPHOS 54 62 62  BILITOT 0.2* 0.3 0.4  PROT 6.8 7.0 6.8  ALBUMIN 3.4* 3.2* 3.2*   Recent Labs    08/31/16 0700 10/23/16 0420  06/21/17 0743 07/05/17 0704 07/12/17 0716  WBC 8.6 7.1   < > 10.7* 11.5* 12.0*  NEUTROABS 5.3 4.0  --   --   --  7.8*  HGB 12.0* 12.0*   < > 11.0* 10.8* 11.0*  HCT 36.9* 36.0*   < > 34.6* 34.2* 35.0*  MCV 86.6 84.9   < > 85.4 85.5 84.3  PLT 357 260   < > 300 340 340   < > = values in this interval not displayed.   Lab Results  Component Value Date   TSH 1.064 10/31/2015   Lab Results  Component Value Date   HGBA1C 7.3 (H) 06/21/2017   Lab Results  Component Value Date   CHOL 113 06/21/2017   HDL 34 (L) 06/21/2017   LDLCALC 63 06/21/2017   TRIG 81 06/21/2017   CHOLHDL 3.3 06/21/2017    Significant Diagnostic Results in last 30 days:  No results found.  Assessment/Plan  #1-pneumonia-he has been started on Levaquin and appears to be doing significantly better today he is afebrile does not complain of shortness of breath O2 saturation is satisfactory at 93%-at this point will continue Levaquin for 7-day course--and continue to monitor.  2.  Constipation again this appears to be stabilized as well he is now on Linzess as well as MiraLAX which is been made routine for a week-.  3.  Leukocytosis I suspect this is most likely due to the pneumonia however a urine culture is pending and will await those results as well he is afebrile feeling better which is encouraging.  4.-History of type 2 diabetes blood sugars continue to be largely in the low 100s despite the infection which is encouraging he is Glucophage 500  mg twice daily  we will continue to monitor at this point does not really appear to be an issue.  [id number]

## 2017-07-15 LAB — URINE CULTURE: Culture: >=100000 — AB

## 2017-07-18 ENCOUNTER — Encounter: Payer: Self-pay | Admitting: Internal Medicine

## 2017-07-18 ENCOUNTER — Non-Acute Institutional Stay (SKILLED_NURSING_FACILITY): Payer: Medicare Other | Admitting: Internal Medicine

## 2017-07-18 DIAGNOSIS — N390 Urinary tract infection, site not specified: Secondary | ICD-10-CM

## 2017-07-18 DIAGNOSIS — J189 Pneumonia, unspecified organism: Secondary | ICD-10-CM | POA: Diagnosis not present

## 2017-07-18 NOTE — Progress Notes (Signed)
Location:   [place] Room Number: 104/W Place of Service:  SNF [phone]) Provider:  Freddi Starr, MD  Patient Care Team: [name], MD as PCP - General (Internal Medicine) Danie Binder, MD as Consulting Physician (Gastroenterology)  Extended Emergency Contact Information Primary Emergency Contact: Dorathy Kinsman States of [place] Phone: [phone] Relation: Daughter Secondary Emergency Contact: [place] of [place] Phone: [phone] Relation: Daughter  Code Status:  DNR Goals of care: Advanced Directive information Advanced Directives 07/13/2017  Does Patient Have a Medical Advance Directive? Yes  Type of Advance Directive Out of facility DNR (pink MOST or yellow form)  Does patient want to make changes to medical advance directive? No - Patient declined  Copy of [place] in Chart? No - copy requested  Would patient like information on creating a medical advance directive? -  Pre-existing out of facility DNR order (yellow form or pink MOST form) -     Chief Complaint  Patient presents with  . Acute Visit    F/U for ABT, UTI, PNA    HPI:  Pt is a 83 y.o. male seen today for an acute visit for follow-up of pneumonia as well as a urine culture positive for UTI.  He is a long-term resident of facility with a history of GI bleed as well as type 2 diabetes history of CVA as well as hypertension hyperlipidemia constipation poor vision coronary artery disease and carotid artery stenosis.  Recently he was not himself and an x-ray did show bilateral pneumonia.  He was started on a course of Levaquin which he is completing today and clinically he appears to be improved.  Urine culture also was obtained which has eventually grown out Proteus mirabilis and we have started him on Augmentin over the weekend for 7-day course.  Per sensitivities he is sensitive to penicillin  based antibiotics.  Nursing staff says he is essentially at his baseline has a good appetite-patient himself says he is not feeling totally up to par but is feeling significantly better- vital signs are stable- when I asked him he did say he was having a small amount of burning with urination     Past Medical History:  Diagnosis Date  . Anemia due to chronic blood loss   . CAD (coronary artery disease)    cath 11/2007: 70% LM ISR, SVG-LAD OK, SVG-D1 100%, IMA-D2 OK, SVG-OM OK, 4-vessel CABG 2000.  Marland Kitchen CAD (coronary artery disease)    s/p of 95% L main artery stenosis, 4/03: cutting balloon PTCA of 80% in-stent restenosis, 7/04. preserved L ventriculr function  . Carotid bruit    bilateral. no sigificant distal abd atherosclerosis by recent cath.   . Chronic bronchitis ([place])   . Constipation   . COPD (chronic obstructive pulmonary disease) (HCC)    ongoing tobacco   . CVA (cerebral infarction)   . DM2 (diabetes mellitus, type 2) ([place])   . Dyslipidemia   . Dysphagia   . Glaucoma   . HTN (hypertension)    Past Surgical History:  Procedure Laterality Date  . CARDIAC CATHETERIZATION    . COLONOSCOPY N/A 05/14/2015   POOR PREP  . COLONOSCOPY N/A 06/02/2015   SLF: 1. four colorectal polyps removed. no source for anemia identified. 2. the left colon is redundant 3. moderate sized internal hemorroids.   . ESOPHAGOGASTRODUODENOSCOPY N/A 05/14/2015   CHRONIC GASTRITIS  . GIVENS CAPSULE STUDY N/A 06/17/2015  Procedure: GIVENS CAPSULE STUDY;  Surgeon: Danie Binder, MD;  [facility];  Service: Endoscopy;  Laterality: N/A;  0800    No Known Allergies  Outpatient Encounter Medications as of 07/18/2017  Medication Sig  . amLODipine (NORVASC) 2.5 MG tablet Take 5 mg by mouth daily.   Marland Kitchen amoxicillin-clavulanate (AUGMENTIN) 500-125 MG tablet Take 1 tablet by mouth 2 (two) times daily.  . Calcium Carbonate-Vitamin D (OSCAL 500/200 D-3 PO) 1 Tablet once a day by mouth . Can't be given  with iron  . clopidogrel (PLAVIX) 75 MG tablet Take 75 mg by mouth daily.  Marland Kitchen docusate sodium (COLACE) 100 MG capsule Take 100 mg by mouth 2 (two) times daily.  . ferrous sulfate (QC FERROUS SULFATE) 325 (65 FE) MG tablet Take 325 mg by mouth daily.   Marland Kitchen labetalol (NORMODYNE) 200 MG tablet Take 200 mg by mouth 3 (three) times daily. Reported on 10/22/2015  . levofloxacin (LEVAQUIN) 500 MG tablet Take 500 mg by mouth daily.  Marland Kitchen linaclotide (LINZESS) 145 MCG CAPS capsule Take 145 mcg by mouth daily before breakfast.  . lisinopril (PRINIVIL,ZESTRIL) 40 MG tablet Take 40 mg by mouth daily.  . metFORMIN (GLUCOPHAGE) 500 MG tablet Take 500 mg by mouth 2 (two) times daily with a meal.   . Multiple Vitamin (MULTIVITAMIN) tablet Take 1 tablet by mouth daily.  . Omega-3 Fatty Acids (FISH OIL) 1000 MG CAPS Take 1,000 mg by mouth 3 (three) times daily.  . pantoprazole (PROTONIX) 40 MG tablet Take 40 mg by mouth daily.  . polyethylene glycol (MIRALAX / GLYCOLAX) packet Take 17 g by mouth daily as needed.  . polyethylene glycol (MIRALAX / GLYCOLAX) packet Take 17 g by mouth daily. Give once a day for 1 week from 07/13/2017-07/19/2017  . pravastatin (PRAVACHOL) 80 MG tablet Take 80 mg by mouth daily.  . Probiotic Product (RISA-BID PROBIOTIC) TABS Take 1 tablet by mouth twice a day from //-//  . senna (SENOKOT) 8.6 MG TABS tablet Take 2 tablets (17.2 mg total) by mouth daily.  . tamsulosin (FLOMAX) 0.4 MG CAPS capsule Take 0.4 mg by mouth daily after supper.   No facility-administered encounter medications on file as of 07/18/2017.     Review of Systems in general is not complaining of any fever chills   .  Skin does not complain of rashes itching or diaphoresis.  Head ears eyes nose mouth and throat does not complain of any sore throat he is legally blind sees shadows.  Respiratory is not complaining of shortness of breath or cough is finishing a course of pneumonia.  Cardiac does not complain of chest pain  has fairly mild lower extremity edema appears to be baseline.  GI is not complaining of abdominal pain he recently was treated for some mild constipation and this appears to be stable.  GU as noted above he is complaining of some mild burning with urination.  Musculoskeletal does not complain at this point of joint pain.  Neurologic is not complain of dizziness headache syncope.  And psych does not complain of depression or anxiety per nursing he is back at his baseline bright alert talkative singing at times  Immunization History  Administered Date(s) Administered  . Influenza,inj,Quad PF,6+ Mos 03/07/2015  . Influenza-Unspecified 02/03/2016, 02/04/2017  . PPD Test 03/10/2015, 03/24/2015  . Pneumococcal Conjugate-13 02/04/2017  . Pneumococcal-Unspecified 02/10/2016  . Tdap 01/13/2017   Pertinent  Health Maintenance Due  Topic Date Due  . URINE MICROALBUMIN  08/11/2017 (Originally  06/25/2017)  . HEMOGLOBIN A1C  12/19/2017  . FOOT EXAM  01/26/2018  . OPHTHALMOLOGY EXAM  02/23/2018  . INFLUENZA VACCINE  Completed  . PNA vac Low Risk Adult  Completed   Fall Risk  01/10/2017  Falls in the past year? No   Functional Status Survey:    Vitals:   07/18/17 1305  BP: (!) 108/54  Pulse: 79  Resp: 18  Temp: 98.2 F (36.8 C)  TempSrc: Oral  SpO2: 90%    Physical Exam   In general this is a pleasant elderly male in no distress lying comfortably in bed.  His skin is warm and dry.  Eyes he is legally blind with a left eye opacity does see shadows.  Oropharynx is clear mucous membranes moist.  Chest is clear to auscultation there is no labored breathing.  Heart is regular rate and rhythm without murmur gallop or rub he does have trace lower extremity edema.  Abdomen is soft does not really appear to be tender there are positive bowel sounds.  GU appears to have possibly some mild suprapubic tenderness although this may be a reaction to the invasive maneuver.  I do not  note any discharge.  Musculoskeletal moves all extremities at baseline with a history of mild left-sided weakness in bed though this is difficult to fully appreciate largely ambulates in a wheelchair.  He does have a slight mouth droop as well which is not new.  Neurologic as noted above his speech is clear he is bright and alert.  Psych largely alert and oriented pleasant and appropriate    Labs reviewed: Recent Labs    06/21/17 0743 07/05/17 0704 07/12/17 1420  NA 140 139 139  K 4.0 3.6 4.4  CL 106 105 106  CO2 22 23 20*  GLUCOSE 124* 118* 164*  BUN 15 12 15   CREATININE 0.80 0.84 1.05  CALCIUM 9.1 8.8* 9.1   Recent Labs    08/16/16 1430 07/05/17 0704 07/12/17 1420  AST 21 13* 16  ALT 18 10* 9*  ALKPHOS 54 62 62  BILITOT 0.2* 0.3 0.4  PROT 6.8 7.0 6.8  ALBUMIN 3.4* 3.2* 3.2*   Recent Labs    08/31/16 0700 10/23/16 0420  06/21/17 0743 07/05/17 0704 07/12/17 0716  WBC 8.6 7.1   < > 10.7* 11.5* 12.0*  NEUTROABS 5.3 4.0  --   --   --  7.8*  HGB 12.0* 12.0*   < > 11.0* 10.8* 11.0*  HCT 36.9* 36.0*   < > 34.6* 34.2* 35.0*  MCV 86.6 84.9   < > 85.4 85.5 84.3  PLT 357 260   < > 300 340 340   < > = values in this interval not displayed.   Lab Results  Component Value Date   TSH 1.064 10/31/2015   Lab Results  Component Value Date   HGBA1C 7.3 (H) 06/21/2017   Lab Results  Component Value Date   CHOL 113 06/21/2017   HDL 34 (L) 06/21/2017   LDLCALC 63 06/21/2017   TRIG 81 06/21/2017   CHOLHDL 3.3 06/21/2017    Significant Diagnostic Results in last 30 days:  No results found.  Assessment/Plan  #1 UTI positive for Proteus mirabilis he is on day 3 of a 7-day course of Augmentin he appears to be stable complains of some mild dysuria at this point will monitor he is afebrile   #2 pneumonia this appears to be largely resolved lung exam is fairly benign he is back to  his baseline up in the hallway sitting in his wheelchair talkative singing enzymes  complete a course of Levaquin today.  Of note he is on a probiotic as well secondary to antibiotic use.  [id number]

## 2017-07-19 ENCOUNTER — Encounter (HOSPITAL_COMMUNITY)
Admission: RE | Admit: 2017-07-19 | Discharge: 2017-07-19 | Disposition: A | Discharge status: Home / Self Care | Payer: Medicare Other | Source: Skilled Nursing Facility | Attending: *Deleted | Admitting: *Deleted

## 2017-07-22 ENCOUNTER — Other Ambulatory Visit: Payer: Self-pay

## 2017-07-22 ENCOUNTER — Emergency Department (HOSPITAL_COMMUNITY): Payer: Medicare Other

## 2017-07-22 ENCOUNTER — Non-Acute Institutional Stay (SKILLED_NURSING_FACILITY): Payer: Medicare Other | Admitting: Internal Medicine

## 2017-07-22 ENCOUNTER — Other Ambulatory Visit (HOSPITAL_COMMUNITY)
Admission: RE | Admit: 2017-07-22 | Discharge: 2017-07-22 | Disposition: A | Discharge status: Home / Self Care | Payer: Medicare Other | Source: Skilled Nursing Facility | Attending: Internal Medicine | Admitting: Internal Medicine

## 2017-07-22 ENCOUNTER — Encounter (HOSPITAL_COMMUNITY): Payer: Self-pay | Admitting: Emergency Medicine

## 2017-07-22 ENCOUNTER — Encounter: Payer: Self-pay | Admitting: Internal Medicine

## 2017-07-22 ENCOUNTER — Inpatient Hospital Stay (HOSPITAL_COMMUNITY)
Admission: EM | Admit: 2017-07-22 | Discharge: 2017-07-30 | DRG: 181 | Disposition: A | Discharge status: Skilled Nursing Facility | Payer: Medicare Other | Attending: Internal Medicine | Admitting: Internal Medicine

## 2017-07-22 DIAGNOSIS — J449 Chronic obstructive pulmonary disease, unspecified: Secondary | ICD-10-CM | POA: Diagnosis not present

## 2017-07-22 DIAGNOSIS — I639 Cerebral infarction, unspecified: Secondary | ICD-10-CM | POA: Diagnosis not present

## 2017-07-22 DIAGNOSIS — Z515 Encounter for palliative care: Secondary | ICD-10-CM

## 2017-07-22 DIAGNOSIS — Z7189 Other specified counseling: Secondary | ICD-10-CM | POA: Diagnosis not present

## 2017-07-22 DIAGNOSIS — N289 Disorder of kidney and ureter, unspecified: Secondary | ICD-10-CM

## 2017-07-22 DIAGNOSIS — E1151 Type 2 diabetes mellitus with diabetic peripheral angiopathy without gangrene: Secondary | ICD-10-CM | POA: Diagnosis present

## 2017-07-22 DIAGNOSIS — I10 Essential (primary) hypertension: Secondary | ICD-10-CM | POA: Insufficient documentation

## 2017-07-22 DIAGNOSIS — B964 Proteus (mirabilis) (morganii) as the cause of diseases classified elsewhere: Secondary | ICD-10-CM | POA: Diagnosis present

## 2017-07-22 DIAGNOSIS — N390 Urinary tract infection, site not specified: Secondary | ICD-10-CM | POA: Diagnosis not present

## 2017-07-22 DIAGNOSIS — Z951 Presence of aortocoronary bypass graft: Secondary | ICD-10-CM

## 2017-07-22 DIAGNOSIS — J189 Pneumonia, unspecified organism: Secondary | ICD-10-CM | POA: Diagnosis not present

## 2017-07-22 DIAGNOSIS — Z8701 Personal history of pneumonia (recurrent): Secondary | ICD-10-CM | POA: Diagnosis not present

## 2017-07-22 DIAGNOSIS — M6281 Muscle weakness (generalized): Secondary | ICD-10-CM | POA: Diagnosis not present

## 2017-07-22 DIAGNOSIS — C3490 Malignant neoplasm of unspecified part of unspecified bronchus or lung: Principal | ICD-10-CM | POA: Diagnosis present

## 2017-07-22 DIAGNOSIS — Z66 Do not resuscitate: Secondary | ICD-10-CM | POA: Diagnosis present

## 2017-07-22 DIAGNOSIS — Z8249 Family history of ischemic heart disease and other diseases of the circulatory system: Secondary | ICD-10-CM

## 2017-07-22 DIAGNOSIS — D473 Essential (hemorrhagic) thrombocythemia: Secondary | ICD-10-CM | POA: Diagnosis present

## 2017-07-22 DIAGNOSIS — Z87891 Personal history of nicotine dependence: Secondary | ICD-10-CM | POA: Diagnosis not present

## 2017-07-22 DIAGNOSIS — N179 Acute kidney failure, unspecified: Secondary | ICD-10-CM | POA: Diagnosis not present

## 2017-07-22 DIAGNOSIS — E785 Hyperlipidemia, unspecified: Secondary | ICD-10-CM | POA: Diagnosis not present

## 2017-07-22 DIAGNOSIS — D509 Iron deficiency anemia, unspecified: Secondary | ICD-10-CM | POA: Diagnosis present

## 2017-07-22 DIAGNOSIS — N4289 Other specified disorders of prostate: Secondary | ICD-10-CM | POA: Insufficient documentation

## 2017-07-22 DIAGNOSIS — J910 Malignant pleural effusion: Secondary | ICD-10-CM | POA: Diagnosis not present

## 2017-07-22 DIAGNOSIS — I69354 Hemiplegia and hemiparesis following cerebral infarction affecting left non-dominant side: Secondary | ICD-10-CM | POA: Diagnosis not present

## 2017-07-22 DIAGNOSIS — D0221 Carcinoma in situ of right bronchus and lung: Secondary | ICD-10-CM | POA: Diagnosis not present

## 2017-07-22 DIAGNOSIS — J90 Pleural effusion, not elsewhere classified: Secondary | ICD-10-CM | POA: Diagnosis not present

## 2017-07-22 DIAGNOSIS — D500 Iron deficiency anemia secondary to blood loss (chronic): Secondary | ICD-10-CM | POA: Diagnosis present

## 2017-07-22 DIAGNOSIS — R918 Other nonspecific abnormal finding of lung field: Secondary | ICD-10-CM | POA: Diagnosis present

## 2017-07-22 DIAGNOSIS — Z7984 Long term (current) use of oral hypoglycemic drugs: Secondary | ICD-10-CM

## 2017-07-22 DIAGNOSIS — I69192 Facial weakness following nontraumatic intracerebral hemorrhage: Secondary | ICD-10-CM | POA: Diagnosis not present

## 2017-07-22 DIAGNOSIS — D72829 Elevated white blood cell count, unspecified: Secondary | ICD-10-CM | POA: Diagnosis not present

## 2017-07-22 DIAGNOSIS — H409 Unspecified glaucoma: Secondary | ICD-10-CM | POA: Diagnosis not present

## 2017-07-22 DIAGNOSIS — R748 Abnormal levels of other serum enzymes: Secondary | ICD-10-CM | POA: Diagnosis present

## 2017-07-22 DIAGNOSIS — R222 Localized swelling, mass and lump, trunk: Secondary | ICD-10-CM

## 2017-07-22 DIAGNOSIS — R5381 Other malaise: Secondary | ICD-10-CM | POA: Diagnosis not present

## 2017-07-22 DIAGNOSIS — I739 Peripheral vascular disease, unspecified: Secondary | ICD-10-CM | POA: Diagnosis not present

## 2017-07-22 DIAGNOSIS — I2510 Atherosclerotic heart disease of native coronary artery without angina pectoris: Secondary | ICD-10-CM | POA: Diagnosis not present

## 2017-07-22 DIAGNOSIS — Z7982 Long term (current) use of aspirin: Secondary | ICD-10-CM | POA: Diagnosis not present

## 2017-07-22 DIAGNOSIS — Z9889 Other specified postprocedural states: Secondary | ICD-10-CM

## 2017-07-22 DIAGNOSIS — Z79899 Other long term (current) drug therapy: Secondary | ICD-10-CM

## 2017-07-22 DIAGNOSIS — R079 Chest pain, unspecified: Secondary | ICD-10-CM | POA: Diagnosis not present

## 2017-07-22 DIAGNOSIS — C384 Malignant neoplasm of pleura: Secondary | ICD-10-CM | POA: Diagnosis not present

## 2017-07-22 DIAGNOSIS — R0600 Dyspnea, unspecified: Secondary | ICD-10-CM

## 2017-07-22 DIAGNOSIS — Z7902 Long term (current) use of antithrombotics/antiplatelets: Secondary | ICD-10-CM | POA: Diagnosis not present

## 2017-07-22 DIAGNOSIS — I4510 Unspecified right bundle-branch block: Secondary | ICD-10-CM | POA: Insufficient documentation

## 2017-07-22 DIAGNOSIS — E1159 Type 2 diabetes mellitus with other circulatory complications: Secondary | ICD-10-CM | POA: Diagnosis not present

## 2017-07-22 DIAGNOSIS — R109 Unspecified abdominal pain: Secondary | ICD-10-CM | POA: Diagnosis not present

## 2017-07-22 DIAGNOSIS — R7989 Other specified abnormal findings of blood chemistry: Secondary | ICD-10-CM

## 2017-07-22 DIAGNOSIS — R778 Other specified abnormalities of plasma proteins: Secondary | ICD-10-CM | POA: Diagnosis present

## 2017-07-22 DIAGNOSIS — K8020 Calculus of gallbladder without cholecystitis without obstruction: Secondary | ICD-10-CM | POA: Diagnosis not present

## 2017-07-22 DIAGNOSIS — R0902 Hypoxemia: Secondary | ICD-10-CM | POA: Diagnosis present

## 2017-07-22 DIAGNOSIS — J42 Unspecified chronic bronchitis: Secondary | ICD-10-CM | POA: Diagnosis not present

## 2017-07-22 DIAGNOSIS — R0602 Shortness of breath: Secondary | ICD-10-CM | POA: Diagnosis not present

## 2017-07-22 LAB — PROTEIN, PLEURAL OR PERITONEAL FLUID: TOTAL PROTEIN, FLUID: 5.4 g/dL

## 2017-07-22 LAB — BODY FLUID CELL COUNT WITH DIFFERENTIAL
EOS FL: 2 %
LYMPHS FL: 41 %
Monocyte-Macrophage-Serous Fluid: 15 % — ABNORMAL LOW (ref 50–90)
Neutrophil Count, Fluid: 42 % — ABNORMAL HIGH (ref 0–25)
OTHER CELLS FL: 0 %
Total Nucleated Cell Count, Fluid: 925 cu mm (ref 0–1000)

## 2017-07-22 LAB — CBC WITH DIFFERENTIAL/PLATELET
BASOS ABS: 0.1 10*3/uL (ref 0.0–0.1)
BASOS PCT: 1 %
Basophils Absolute: 0.1 10*3/uL (ref 0.0–0.1)
Basophils Relative: 1 %
Eosinophils Absolute: 0.8 10*3/uL — ABNORMAL HIGH (ref 0.0–0.7)
Eosinophils Absolute: 0.7 10*3/uL (ref 0.0–0.7)
Eosinophils Relative: 5 %
Eosinophils Relative: 4 %
HCT: 36.6 % — ABNORMAL LOW (ref 39.0–52.0)
HEMATOCRIT: 35 % — AB (ref 39.0–52.0)
Hemoglobin: 12 g/dL — ABNORMAL LOW (ref 13.0–17.0)
Hemoglobin: 11.1 g/dL — ABNORMAL LOW (ref 13.0–17.0)
LYMPHS ABS: 1.9 10*3/uL (ref 0.7–4.0)
LYMPHS PCT: 10 %
Lymphocytes Relative: 9 %
Lymphs Abs: 1.7 10*3/uL (ref 0.7–4.0)
MCH: 26.9 pg (ref 26.0–34.0)
MCH: 26.4 pg (ref 26.0–34.0)
MCHC: 32.8 g/dL (ref 30.0–36.0)
MCHC: 31.7 g/dL (ref 30.0–36.0)
MCV: 82.1 fL (ref 78.0–100.0)
MCV: 83.1 fL (ref 78.0–100.0)
MONO ABS: 1.5 10*3/uL — AB (ref 0.1–1.0)
MONOS PCT: 7 %
Monocytes Absolute: 1.3 10*3/uL — ABNORMAL HIGH (ref 0.1–1.0)
Monocytes Relative: 8 %
NEUTROS ABS: 14.2 10*3/uL — AB (ref 1.7–7.7)
NEUTROS ABS: 14.5 10*3/uL — AB (ref 1.7–7.7)
Neutrophils Relative %: 77 %
Neutrophils Relative %: 78 %
Platelets: 417 10*3/uL — ABNORMAL HIGH (ref 150–400)
Platelets: 422 10*3/uL — ABNORMAL HIGH (ref 150–400)
RBC: 4.46 MIL/uL (ref 4.22–5.81)
RBC: 4.21 MIL/uL — ABNORMAL LOW (ref 4.22–5.81)
RDW: 13.8 % (ref 11.5–15.5)
RDW: 13.8 % (ref 11.5–15.5)
WBC: 18.3 10*3/uL — ABNORMAL HIGH (ref 4.0–10.5)
WBC: 18.5 10*3/uL — ABNORMAL HIGH (ref 4.0–10.5)

## 2017-07-22 LAB — GRAM STAIN

## 2017-07-22 LAB — BRAIN NATRIURETIC PEPTIDE: B Natriuretic Peptide: 79 pg/mL (ref 0.0–100.0)

## 2017-07-22 LAB — TROPONIN I
TROPONIN I: 0.03 ng/mL — AB (ref <0.03)
TROPONIN I: 0.03 ng/mL — AB (ref <0.03)

## 2017-07-22 LAB — COMPREHENSIVE METABOLIC PANEL
ALBUMIN: 3.2 g/dL — AB (ref 3.5–5.0)
ALK PHOS: 54 U/L (ref 38–126)
ALT: 11 U/L — ABNORMAL LOW (ref 17–63)
ALT: 12 U/L — ABNORMAL LOW (ref 17–63)
ANION GAP: 14 (ref 5–15)
AST: 17 U/L (ref 15–41)
AST: 15 U/L (ref 15–41)
Albumin: 3.2 g/dL — ABNORMAL LOW (ref 3.5–5.0)
Alkaline Phosphatase: 58 U/L (ref 38–126)
Anion gap: 15 (ref 5–15)
BILIRUBIN TOTAL: 0.6 mg/dL (ref 0.3–1.2)
BILIRUBIN TOTAL: 0.6 mg/dL (ref 0.3–1.2)
BUN: 58 mg/dL — AB (ref 6–20)
BUN: 58 mg/dL — AB (ref 6–20)
CALCIUM: 8.8 mg/dL — AB (ref 8.9–10.3)
CO2: 16 mmol/L — AB (ref 22–32)
CO2: 17 mmol/L — ABNORMAL LOW (ref 22–32)
Calcium: 9.2 mg/dL (ref 8.9–10.3)
Chloride: 107 mmol/L (ref 101–111)
Chloride: 108 mmol/L (ref 101–111)
Creatinine, Ser: 2.67 mg/dL — ABNORMAL HIGH (ref 0.61–1.24)
Creatinine, Ser: 2.97 mg/dL — ABNORMAL HIGH (ref 0.61–1.24)
GFR calc Af Amer: 24 mL/min — ABNORMAL LOW (ref >60)
GFR calc Af Amer: 21 mL/min — ABNORMAL LOW (ref >60)
GFR calc non Af Amer: 21 mL/min — ABNORMAL LOW (ref >60)
GFR, EST NON AFRICAN AMERICAN: 18 mL/min — AB (ref >60)
GLUCOSE: 152 mg/dL — AB (ref 65–99)
Glucose, Bld: 196 mg/dL — ABNORMAL HIGH (ref 65–99)
POTASSIUM: 4.3 mmol/L (ref 3.5–5.1)
Potassium: 5 mmol/L (ref 3.5–5.1)
Sodium: 138 mmol/L (ref 135–145)
Sodium: 139 mmol/L (ref 135–145)
TOTAL PROTEIN: 7.6 g/dL (ref 6.5–8.1)
Total Protein: 7.4 g/dL (ref 6.5–8.1)

## 2017-07-22 LAB — CBG MONITORING, ED: Glucose-Capillary: 136 mg/dL — ABNORMAL HIGH (ref 65–99)

## 2017-07-22 LAB — GLUCOSE, CAPILLARY: Glucose-Capillary: 99 mg/dL (ref 65–99)

## 2017-07-22 LAB — TSH: TSH: 1.483 u[IU]/mL (ref 0.350–4.500)

## 2017-07-22 MED ORDER — SODIUM CHLORIDE 0.9 % IV SOLN: INTRAVENOUS | Status: DC

## 2017-07-22 MED ORDER — PANTOPRAZOLE SODIUM 40 MG PO TBEC
40.0000 mg | DELAYED_RELEASE_TABLET | Freq: Every day | ORAL | Status: DC
  Administered 2017-07-23 – 2017-07-30 (×7): 40 mg via ORAL
  Filled 2017-07-22 (×8): qty 1

## 2017-07-22 MED ORDER — CLOPIDOGREL BISULFATE 75 MG PO TABS
75.0000 mg | ORAL_TABLET | Freq: Every day | ORAL | Status: DC
  Administered 2017-07-23: 75 mg via ORAL
  Filled 2017-07-22: qty 1

## 2017-07-22 MED ORDER — SODIUM CHLORIDE 0.9 % IV SOLN
INTRAVENOUS | Status: AC
  Administered 2017-07-23: 01:00:00 via INTRAVENOUS

## 2017-07-22 MED ORDER — HEPARIN SODIUM (PORCINE) 5000 UNIT/ML IJ SOLN
5000.0000 [IU] | Freq: Three times a day (TID) | INTRAMUSCULAR | Status: DC
  Administered 2017-07-23 – 2017-07-28 (×15): 5000 [IU] via SUBCUTANEOUS
  Filled 2017-07-22 (×15): qty 1

## 2017-07-22 MED ORDER — ORAL CARE MOUTH RINSE
15.0000 mL | Freq: Two times a day (BID) | OROMUCOSAL | Status: DC
  Administered 2017-07-23 – 2017-07-28 (×8): 15 mL via OROMUCOSAL

## 2017-07-22 MED ORDER — DOCUSATE SODIUM 100 MG PO CAPS
100.0000 mg | ORAL_CAPSULE | Freq: Two times a day (BID) | ORAL | Status: DC
  Administered 2017-07-23 – 2017-07-30 (×15): 100 mg via ORAL
  Filled 2017-07-22 (×16): qty 1

## 2017-07-22 MED ORDER — HYDROCODONE-ACETAMINOPHEN 5-325 MG PO TABS
1.0000 | ORAL_TABLET | ORAL | Status: DC | PRN
  Administered 2017-07-23: 1 via ORAL
  Filled 2017-07-22: qty 1

## 2017-07-22 MED ORDER — SENNA 8.6 MG PO TABS
2.0000 | ORAL_TABLET | Freq: Every day | ORAL | Status: DC
  Administered 2017-07-23 – 2017-07-30 (×7): 17.2 mg via ORAL
  Filled 2017-07-22 (×8): qty 2

## 2017-07-22 MED ORDER — ONDANSETRON HCL 4 MG PO TABS: 4.0000 mg | ORAL_TABLET | Freq: Four times a day (QID) | ORAL | Status: DC | PRN

## 2017-07-22 MED ORDER — RISAQUAD PO CAPS
1.0000 | ORAL_CAPSULE | Freq: Two times a day (BID) | ORAL | Status: DC
  Administered 2017-07-23 – 2017-07-30 (×14): 1 via ORAL
  Filled 2017-07-22 (×15): qty 1

## 2017-07-22 MED ORDER — LINACLOTIDE 145 MCG PO CAPS
145.0000 ug | ORAL_CAPSULE | Freq: Every day | ORAL | Status: DC
  Administered 2017-07-23 – 2017-07-30 (×7): 145 ug via ORAL
  Filled 2017-07-22 (×8): qty 1

## 2017-07-22 MED ORDER — PRAVASTATIN SODIUM 40 MG PO TABS
80.0000 mg | ORAL_TABLET | Freq: Every day | ORAL | Status: DC
  Administered 2017-07-23 – 2017-07-29 (×7): 80 mg via ORAL
  Filled 2017-07-22 (×2): qty 2
  Filled 2017-07-22: qty 1
  Filled 2017-07-22: qty 2
  Filled 2017-07-22 (×2): qty 1
  Filled 2017-07-22 (×2): qty 2
  Filled 2017-07-22 (×2): qty 1

## 2017-07-22 MED ORDER — TAMSULOSIN HCL 0.4 MG PO CAPS
0.4000 mg | ORAL_CAPSULE | Freq: Every day | ORAL | Status: DC
  Administered 2017-07-23 – 2017-07-29 (×8): 0.4 mg via ORAL
  Filled 2017-07-22 (×8): qty 1

## 2017-07-22 MED ORDER — INSULIN ASPART 100 UNIT/ML ~~LOC~~ SOLN
0.0000 [IU] | SUBCUTANEOUS | Status: DC
  Administered 2017-07-23: 1 [IU] via SUBCUTANEOUS
  Administered 2017-07-23 – 2017-07-24 (×3): 2 [IU] via SUBCUTANEOUS
  Administered 2017-07-24 (×2): 1 [IU] via SUBCUTANEOUS

## 2017-07-22 MED ORDER — AMOXICILLIN-POT CLAVULANATE 500-125 MG PO TABS
1.0000 | ORAL_TABLET | Freq: Two times a day (BID) | ORAL | Status: DC
  Administered 2017-07-23 – 2017-07-24 (×4): 500 mg via ORAL
  Filled 2017-07-22 (×4): qty 1

## 2017-07-22 MED ORDER — ADULT MULTIVITAMIN W/MINERALS CH
1.0000 | ORAL_TABLET | Freq: Every day | ORAL | Status: DC
  Administered 2017-07-23 – 2017-07-30 (×7): 1 via ORAL
  Filled 2017-07-22 (×8): qty 1

## 2017-07-22 MED ORDER — ONDANSETRON HCL 4 MG/2ML IJ SOLN: 4.0000 mg | Freq: Four times a day (QID) | INTRAMUSCULAR | Status: DC | PRN

## 2017-07-22 MED ORDER — LABETALOL HCL 200 MG PO TABS
200.0000 mg | ORAL_TABLET | Freq: Three times a day (TID) | ORAL | Status: DC
  Administered 2017-07-23 – 2017-07-30 (×20): 200 mg via ORAL
  Filled 2017-07-22 (×21): qty 1

## 2017-07-22 MED ORDER — ACETAMINOPHEN 650 MG RE SUPP: 650.0000 mg | Freq: Four times a day (QID) | RECTAL | Status: DC | PRN

## 2017-07-22 MED ORDER — POLYETHYLENE GLYCOL 3350 17 G PO PACK: 17.0000 g | PACK | Freq: Every day | ORAL | Status: DC | PRN

## 2017-07-22 MED ORDER — ACETAMINOPHEN 325 MG PO TABS
650.0000 mg | ORAL_TABLET | Freq: Four times a day (QID) | ORAL | Status: DC | PRN
  Administered 2017-07-24: 650 mg via ORAL
  Filled 2017-07-22: qty 2

## 2017-07-22 MED ORDER — OMEGA-3-ACID ETHYL ESTERS 1 G PO CAPS
1.0000 g | ORAL_CAPSULE | Freq: Two times a day (BID) | ORAL | Status: DC
  Administered 2017-07-23 – 2017-07-30 (×14): 1 g via ORAL
  Filled 2017-07-22 (×15): qty 1

## 2017-07-22 NOTE — ED Notes (Signed)
Bladder scanner indicated 58 ml

## 2017-07-22 NOTE — ED Notes (Signed)
Patient is resting comfortably. 

## 2017-07-22 NOTE — ED Notes (Signed)
Patient unable to provide urine specimen at this time and refused in/out cath.

## 2017-07-22 NOTE — ED Notes (Signed)
CRITICAL VALUE ALERT  Critical Value: 0.03 Date & Time Notied:  07/22/2017 @ 1962  Provider Notified: MCmanus  Orders Received/Actions taken: no orders

## 2017-07-22 NOTE — H&P (Signed)
History and Physical    [name]:[id number] DOB: [birth year] DOA: 07/22/2017  PCP: [name], MD   Patient coming from: SNF   Chief Complaint: Malaise, SOB, cough, loss of appetite  HPI: [name] is a 83 y.o. male with medical history significant for hypertension, type 2 diabetes mellitus, coronary artery disease, recent pneumonia status post treatment with Levaquin, and recent UTI completing a course of Augmentin, now presenting to the emergency department from his SNF for evaluation of malaise, anorexia, shortness of breath, and cough.  Patient has been more lethargic and not eating or drinking at his SNF for the past couple days.  He complains of not feeling well, but there are no specific complaints.  He was recently treated for pneumonia with Levaquin and is completing a course of Augmentin for a UTI.  He is a former smoker who quit approximately 3 years ago.  ED Course: Upon arrival to the ED, patient is found to be afebrile, saturating upper  80s on room air, mildly tachypneic, and with vitals otherwise stable.  EKG features a sinus rhythm with RBBB and LVH with repolarization abnormality.  Chest x-ray is notable for a large right pleural effusion with associated airspace disease.  CT chest reveals a large right pleural effusion with complete atelectasis of the right middle and lower lobes and partial atelectasis of the right upper lobe, as well as probable pleural-based tumor at the anterior mid chest and bilateral lung nodules.  Abdominal ultrasound reveals cholelithiasis without acute cholecystitis.  Chemistry panel is notable for a bicarbonate of 17, glucose 196, BUN 58, and creatinine 2.97, up from 1.05 earlier this month.  CBC is notable for leukocytosis to 18,500, mild normocytic anemia, and mild thrombocytosis.  Troponin is slightly elevated to 0.03.  Thoracentesis was performed in the emergency department with just over 1 L taken off; fluid sent for analysis.   Postprocedural x-ray demonstrates persistent large right pleural effusion.  Patient remains hemodynamically stable.  He will be admitted to the telemetry unit for ongoing evaluation and management of large right pleural effusion, concerning for malignant effusion.  Review of Systems:  All other systems reviewed and apart from HPI, are negative.  Past Medical History:  Diagnosis Date  . Anemia due to chronic blood loss   . CAD (coronary artery disease)    cath 11/2007: 70% LM ISR, SVG-LAD OK, SVG-D1 100%, IMA-D2 OK, SVG-OM OK, 4-vessel CABG 2000.  Marland Kitchen CAD (coronary artery disease)    s/p of 95% L main artery stenosis, 4/03: cutting balloon PTCA of 80% in-stent restenosis, 7/04. preserved L ventriculr function  . Carotid bruit    bilateral. no sigificant distal abd atherosclerosis by recent cath.   . Chronic bronchitis ([place])   . Constipation   . COPD (chronic obstructive pulmonary disease) ([facility])    ongoing tobacco   . CVA (cerebral infarction)    left sided weakness  . DM2 (diabetes mellitus, type 2) ([place])   . Dyslipidemia   . Dysphagia   . Glaucoma   . HTN (hypertension)     Past Surgical History:  Procedure Laterality Date  . CARDIAC CATHETERIZATION    . COLONOSCOPY N/A 05/14/2015   POOR PREP  . COLONOSCOPY N/A 06/02/2015   SLF: 1. four colorectal polyps removed. no source for anemia identified. 2. the left colon is redundant 3. moderate sized internal hemorroids.   . ESOPHAGOGASTRODUODENOSCOPY N/A 05/14/2015   CHRONIC GASTRITIS  . GIVENS CAPSULE STUDY N/A 06/17/2015   Procedure:  GIVENS CAPSULE STUDY;  Surgeon: Danie Binder, MD;  Location: [facility];  Service: Endoscopy;  Laterality: N/A;  0800     reports that he quit smoking about 2 years ago. He has never used smokeless tobacco. He reports that he does not drink alcohol or use drugs.  No Known Allergies  Family History  Problem Relation Age of Onset  . Hypertension Mother   . Stroke Maternal Grandmother   . Cancer  Neg Hx   . Diabetes Neg Hx   . Heart disease Neg Hx      Prior to Admission medications   Medication Sig Start Date End Date Taking? Authorizing Provider  amLODipine (NORVASC) 2.5 MG tablet Take 5 mg by mouth daily.  05/14/16   [provider]  amoxicillin-clavulanate (AUGMENTIN) 500-125 MG tablet Take 1 tablet by mouth 2 (two) times daily. 07/16/17 07/22/17  [provider]  Calcium Carbonate-Vitamin D (OSCAL 500/200 D-3 PO) 1 Tablet once a day by mouth . Can't be given with iron    [provider]  clopidogrel (PLAVIX) 75 MG tablet Take 75 mg by mouth daily.    [provider]  docusate sodium (COLACE) 100 MG capsule Take 100 mg by mouth 2 (two) times daily.    [provider]  ferrous sulfate (QC FERROUS SULFATE) 325 (65 FE) MG tablet Take 325 mg by mouth daily.     [provider]  labetalol (NORMODYNE) 200 MG tablet Take 200 mg by mouth 3 (three) times daily. Reported on 10/22/2015    [provider]  linaclotide (LINZESS) 145 MCG CAPS capsule Take 145 mcg by mouth daily before breakfast.    [provider]  lisinopril (PRINIVIL,ZESTRIL) 40 MG tablet Take 40 mg by mouth daily.    [provider]  metFORMIN (GLUCOPHAGE) 500 MG tablet Take 500 mg by mouth 2 (two) times daily with a meal.     [provider]  Multiple Vitamin (MULTIVITAMIN) tablet Take 1 tablet by mouth daily.    [provider]  Omega-3 Fatty Acids (FISH OIL) 1000 MG CAPS Take 1,000 mg by mouth 3 (three) times daily.    [provider]  pantoprazole (PROTONIX) 40 MG tablet Take 40 mg by mouth daily.    [provider]  polyethylene glycol (MIRALAX / GLYCOLAX) packet Take 17 g by mouth daily as needed.    [provider]  pravastatin (PRAVACHOL) 80 MG tablet Take 80 mg by mouth daily.    [provider]  Probiotic Product (RISA-BID PROBIOTIC) TABS Take 1 tablet by mouth twice a day from //-//     [provider]  senna (SENOKOT) 8.6 MG TABS tablet Take 2 tablets (17.2 mg total) by mouth daily. 03/10/15   Janece Canterbury, MD  tamsulosin (FLOMAX) 0.4 MG CAPS capsule Take 0.4 mg by mouth daily after supper.    [provider]    Physical Exam: Vitals:   07/22/17 1800 07/22/17 1930 07/22/17 2000 07/22/17 2030  BP: 120/64 (!) 149/60 (!) 145/56 135/63  Pulse: 70  70   Resp: 16 12 17 16   Temp:      TempSrc:      SpO2: 95%  98%   Weight:      Height:          Constitutional: NAD, calm, chronically-ill appearing. Eyes: PERTLA, lids and conjunctivae normal ENMT: Mucous membranes are moist. Posterior pharynx clear of any exudate or lesions.   Neck: normal, supple, no masses,  no thyromegaly Respiratory: Markedly diminished on right. No accessory muscle use.  Cardiovascular: S1 & S2 heard, regular rate and rhythm. No significant JVD. Abdomen: No distension, no tenderness, no masses palpated. Bowel sounds normal.  Musculoskeletal: no clubbing / cyanosis. No joint deformity upper and lower extremities.  Skin: no significant rashes, lesions, ulcers. Warm, dry, well-perfused. Neurologic: mild dysarthria. Sensation to light touch intact. Moving all extremities.  Psychiatric: Lethargic. Oriented to person, place, and situation. Cooperative.     Labs on Admission: I have personally reviewed following labs and imaging studies  CBC: Recent Labs  Lab 07/22/17 1240 07/22/17 1506  WBC 18.3* 18.5*  NEUTROABS 14.2* 14.5*  HGB 12.0* 11.1*  HCT 36.6* 35.0*  MCV 82.1 83.1  PLT 417* 528*   Basic Metabolic Panel: Recent Labs  Lab 07/22/17 1240 07/22/17 1506  NA 138 139  K 5.0 4.3  CL 107 108  CO2 16* 17*  GLUCOSE 152* 196*  BUN 58* 58*  CREATININE 2.67* 2.97*  CALCIUM 9.2 8.8*   GFR: Estimated Creatinine Clearance: 19.8 mL/min (A) (by C-G formula based on SCr of 2.97 mg/dL (H)). Liver Function Tests: Recent Labs  Lab 07/22/17 1240 07/22/17 1506  AST 17  15  ALT 11* 12*  ALKPHOS 58 54  BILITOT 0.6 0.6  PROT 7.4 7.6  ALBUMIN 3.2* 3.2*   No results for input(s): LIPASE, AMYLASE in the last 168 hours. No results for input(s): AMMONIA in the last 168 hours. Coagulation Profile: No results for input(s): INR, PROTIME in the last 168 hours. Cardiac Enzymes: Recent Labs  Lab 07/22/17 1507  TROPONINI 0.03*   BNP (last 3 results) No results for input(s): PROBNP in the last 8760 hours. HbA1C: No results for input(s): HGBA1C in the last 72 hours. CBG: Recent Labs  Lab 07/22/17 1820  GLUCAP 136*   Lipid Profile: No results for input(s): CHOL, HDL, LDLCALC, TRIG, CHOLHDL, LDLDIRECT in the last 72 hours. Thyroid Function Tests: Recent Labs    07/22/17 1240  TSH 1.483   Anemia Panel: No results for input(s): VITAMINB12, FOLATE, FERRITIN, TIBC, IRON, RETICCTPCT in the last 72 hours. Urine analysis:    Component Value Date/Time   COLORURINE AMBER (A) 07/12/2017 1420   APPEARANCEUR HAZY (A) 07/12/2017 1420   LABSPEC 1.016 07/12/2017 1420   PHURINE 8.0 07/12/2017 1420   GLUCOSEU NEGATIVE 07/12/2017 1420   HGBUR NEGATIVE 07/12/2017 1420   BILIRUBINUR NEGATIVE 07/12/2017 1420   KETONESUR NEGATIVE 07/12/2017 1420   PROTEINUR 30 (A) 07/12/2017 1420   UROBILINOGEN 0.2 03/15/2015 1702   NITRITE NEGATIVE 07/12/2017 1420   LEUKOCYTESUR SMALL (A) 07/12/2017 1420   Sepsis Labs: @LABRCNTIP (procalcitonin:4,lacticidven:4) ) Recent Results (from the past 240 hour(s))  Gram stain     Status: None   Collection Time: 07/22/17  4:20 PM  Result Value Ref Range Status   Specimen Description THORACENTESIS  Final   Special Requests NONE  Final   Gram Stain   Final    CYTOSPIN SMEAR NO ORGANISMS SEEN WBC PRESENT,BOTH PMN AND MONONUCLEAR Performed at [facility], [address] [place], [place] [address]    Report Status 07/22/2017 FINAL  Final     Radiological Exams on Admission: Dg Chest 1 View  Result Date: 07/22/2017 CLINICAL DATA:   RIGHT pleural effusion post thoracentesis with removal of 1.05 L of fluid from the RIGHT chest EXAM: CHEST  1 VIEW COMPARISON:  Earlier study of 07/22/2017 FINDINGS: Enlargement of cardiac silhouette post CABG. Atherosclerotic calcification aorta. Persistent large RIGHT pleural effusion  with significant atelectasis of the mid to lower RIGHT lung. Minimal LEFT basilar atelectasis. No pneumothorax. Osseous structures unremarkable. IMPRESSION: Persistent large RIGHT pleural effusion and significant atelectasis of the mid to lower RIGHT lung despite thoracentesis. No pneumothorax. Electronically Signed   By: Lavonia Dana M.D.   On: 07/22/2017 16:55   Dg Chest 2 View  Result Date: 07/22/2017 CLINICAL DATA:  Acutely elevated BUN and white blood cell count. Headache today. EXAM: CHEST - 2 VIEW COMPARISON:  Single-view of the chest 04/05/2015. FINDINGS: The patient has a large right pleural effusion with extensive airspace disease. The left lung is clear. There is cardiomegaly. The patient is status post CABG. Atherosclerosis is noted. IMPRESSION: Large right pleural effusion and associated airspace disease which cannot be definitively characterized. Cardiomegaly. Electronically Signed   By: Inge Rise M.D.   On: 07/22/2017 15:26   Ct Chest Wo Contrast  Result Date: 07/22/2017 CLINICAL DATA:  RIGHT pleural effusion EXAM: CT CHEST WITHOUT CONTRAST TECHNIQUE: Multidetector CT imaging of the chest was performed following the standard protocol without IV contrast. Sagittal and coronal MPR images reconstructed from axial data set. IV contrast not utilized due to renal dysfunction. COMPARISON:  Chest radiograph 07/22/2017 FINDINGS: Cardiovascular: Atherosclerotic calcifications aorta, proximal great vessels and coronary arteries. Aorta normal caliber. No pericardial effusion. Mediastinum/Nodes: Esophagus unremarkable. Base of cervical region normal appearance. Enlarged precarinal lymph node 15 mm short axis image 55.  Additional scattered normal sized mediastinal nodes. Hilar assessment limited by lack of IV contrast but RIGHT hilum appears slightly enlarged adenopathy questioned. Lungs/Pleura: Large RIGHT pleural effusion. Complete atelectasis of RIGHT lower lobe. Complete atelectasis of RIGHT middle lobe. Partial atelectasis of RIGHT upper lobe. Abnormal soft tissue density at the anteromedial mid RIGHT chest highly suspicious for pleural based tumor. This measures up to 5.1 x 3.5 cm image 67 extends approximately 6.1 cm craniocaudal length. This abuts both the anterior chest wall and the mediastinum. Questionable area of minimal pleural thickening at the inferomedial RIGHT hemithorax. 9 mm LEFT mid lung nodule at major fissure image 73. Question additional midlung nodule 6 mm diameter image 70. Tiny calcified granuloma LEFT upper lobe. Question additional 5 mm LEFT upper lobe nodule. Tiny RIGHT upper lobe pulmonary nodules sagittal images 48 and 54. Upper Abdomen: Unremarkable prior median sternotomy. Musculoskeletal: No acute osseous findings. IMPRESSION: Large RIGHT pleural effusion with complete atelectasis of the RIGHT middle and RIGHT lower lobes and partial atelectasis of the RIGHT upper lobe. 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 BILATERAL lung nodules. a Extensive atherosclerotic calcifications including coronary arteries. Aortic Atherosclerosis (ICD10-I70.0). Electronically Signed   By: Lavonia Dana M.D.   On: 07/22/2017 19:09   US Abdomen Complete  Result Date: 07/22/2017 CLINICAL DATA:  Elevated BUN and creatinine, does not feel good, RIGHT pleural effusion by chest radiograph EXAM: ABDOMEN ULTRASOUND COMPLETE COMPARISON:  CT abdomen and pelvis 02/02/2015 FINDINGS: Gallbladder: Shadowing gallstone 13 mm diameter within gallbladder. No gallbladder wall thickening, pericholecystic fluid or sonographic Murphy sign. Common bile duct: Diameter: Normal  caliber 3 mm diameter Liver: Upper normal hepatic echogenicity. No discrete hepatic mass lesion. Portal vein is patent on color Doppler imaging with normal direction of blood flow towards the liver. IVC: Normal appearance Pancreas: Visualized portion of pancreatic body and proximal tail normal appearance, remainder obscured by bowel gas Spleen: Inadequately visualized due to high subcostal position Right Kidney: Length: 10.9 cm. Normal morphology without mass or hydronephrosis. Left  Kidney: Length: 10.6 cm. Normal morphology without mass or hydronephrosis. Abdominal aorta: Normal caliber at proximal and mid portions, bifurcation obscured by bowel gas Other findings: No free fluid IMPRESSION: Cholelithiasis without evidence acute cholecystitis. Incomplete visualization of pancreas, spleen, and distal aorta. Electronically Signed   By: Lavonia Dana M.D.   On: 07/22/2017 16:39   US Thoracentesis Asp Pleural Space W/img Guide  Result Date: 07/22/2017 INDICATION: Large RIGHT pleural effusion EXAM: ULTRASOUND GUIDED DIAGNOSTIC AND THERAPEUTIC RIGHT THORACENTESIS MEDICATIONS: None COMPLICATIONS: None immediate. PROCEDURE: Procedure, benefits, and risks of procedure were discussed with patient. Written informed consent for procedure was obtained. Time out protocol followed. Pleural effusion localized by ultrasound at the posterior RIGHT hemithorax. Skin prepped and draped in usual sterile fashion. Skin and soft tissues anesthetized with 10 mL of 1% lidocaine. 8 French thoracentesis catheter placed into the RIGHT pleural space. 1.05 L of dark old bloody fluid aspirated by syringe and vacuum bottle suction. Procedure tolerated well by patient without immediate complication. FINDINGS: A total of approximately 1.05 L of RIGHT pleural fluid was removed. Samples were sent to the laboratory as requested by the clinical team. IMPRESSION: Successful ultrasound guided RIGHT thoracentesis yielding 1.05 L of pleural fluid.  Electronically Signed   By: Lavonia Dana M.D.   On: 07/22/2017 16:46    EKG: Independently reviewed. Sinus rhythm, RBBB, LVH with repolarization abnormality.   Assessment/Plan  1. Right pleural effusion with hypoxia; pleural-based mass, lung nodules   - Presents from SNF with malaise and cough, SOB  - Recently completed Levaquin for suspected PNA  - CXR with large right pleural effusion; CT with mass and pulm nodules  - Thoracentesis performed in ED with just over a liter off; post-procedure CXR with large residual effusion and atelectasis  - Follow-up pleural fluid analysis, including cytology  - Continue supportive care with supplemental O2 and incentive spirometry, consider repeat thoracentesis for therapeutic purposes    2. AKI  - SCr is 2.97 on admission, up from 1.05 earlier this month  - Kidneys unremarkable on abs [place] with no hydro  - Check urine studies, continue IVF hydration, renally-dose medications, avoid nephrotoxins, repeat chem panel in am   3. UTI   - Complete Augmentin for recent UTI    4. Hypertension - BP at low end of normal in ED   - Continue labetalol with holding parameters, hold lisinopril and Norvasc   5. CAD, elevated troponin  - Hx of CAD, no anginal complaints on admission, troponin slightly elevated to 0.03  - Continue cardiac monitoring, trend troponin, continue Plavix and beta-blocker, hold ACE in light of AKI    6. Type II DM  - A1c was 7.3% in February 2019  - Managed with metformin at SNF, held on admission  - Follow CBG's and start SSI while in hospital     DVT prophylaxis: sq heparin  Code Status: DNR Family Communication: Granddaughter (POA) updated at bedside  Consults called: None Admission status: Inpatient    Vianne Bulls, MD [facility] Pager [phone]  If 7PM-7AM, please contact night-coverage [email or web address] Password [facility]  07/22/2017, 8:45 PM

## 2017-07-22 NOTE — Progress Notes (Signed)
Location:   [place] Room Number: 104/W Place of Service:  SNF [phone]) Provider:  Freddi Starr, MD  Patient Care Team: [name], MD as PCP - General (Internal Medicine) Danie Binder, MD as Consulting Physician (Gastroenterology)  Extended Emergency Contact Information Primary Emergency Contact: Dorathy Kinsman [place] of [place] Phone: [phone] Relation: Daughter Secondary Emergency Contact: [place] Phone: [phone] Relation: Daughter  Code Status:  DNR Goals of care: Advanced Directive information Advanced Directives 07/22/2017  Does Patient Have a Medical Advance Directive? Yes  Type of Advance Directive Out of facility DNR (pink MOST or yellow form)  Does patient want to make changes to medical advance directive? No - Patient declined  Copy of Blanket in Chart? No - copy requested  Would patient like information on creating a medical advance directive? -  Pre-existing out of facility DNR order (yellow form or pink MOST form) -    Chief complaint-acute visit secondary to leukocytosis renal insufficiency  HPI:  Pt is a 83 y.o. male seen today for an acute visit for for what nursing described initially has some malaise-states that starting yesterday patient was just not himself not eating as well drinking as well.  He was seen several days ago for follow-up of UTI as well as pneumonia-he is completing treatment for a Proteus mirabilis urinary tract infection with Augmentim- he has also completed a course of Levaquin for pneumonia  Patient appeared to be doing well and was back to being his usual boisterous  self  However nursing staff noticed the last day or so--some intermittent  Changes--where he was not quite himself.  Apparently he was pretty much himself this morning and ate and drank well but later in the day appear to have some malaise did not  eat his dinner-- He has somewhat vague complaints but could not really localize any acute area of concern-there has been no nausea or vomiting he does not really complain of significant abdominal pain or cough--apparently he complained of some shortness of breath working with therapy yesterday- with exertion  Vital signs appear to be stable he is afebrile.  O2 saturation is in the 90s on room air  We did order stat lab work however which has come back showing a significantly elevated creatinine of 2.67 with a BUN of 58 baseline creatinine appears to be around 1.  His white count also has risen up to 18,300 previously had been in the 10-12,000 range.  WE have   ordered a mobile chest x-ray and abdominal series  His other medical issues include history of type 2 diabetes history of CVA hypertension hyper lipidemia poor vision coronary artery disease and carotid artery disease as well as a history of GI bleeding in the past.  At one point he had failure to thrive but this certainly turned around and at one point was gaining a significant amount of weight  Again over the last couple days however there is been a change especially noted today by nursing staff-was eating poorly with vague complaints     --   Past Medical History:  Diagnosis Date  . Anemia due to chronic blood loss   . CAD (coronary artery disease)    cath 11/2007: 70% LM ISR, SVG-LAD OK, SVG-D1 100%, IMA-D2 OK, SVG-OM OK, 4-vessel CABG 2000.  Marland Kitchen CAD (coronary artery disease)    s/p of 95% L main artery stenosis, 4/03:  cutting balloon PTCA of 80% in-stent restenosis, 7/04. preserved L ventriculr function  . Carotid bruit    bilateral. no sigificant distal abd atherosclerosis by recent cath.   . Chronic bronchitis ([place])   . Constipation   . COPD (chronic obstructive pulmonary disease) (HCC)    ongoing tobacco   . CVA (cerebral infarction)   . DM2 (diabetes mellitus, type 2) ([place])   . Dyslipidemia   . Dysphagia   .  Glaucoma   . HTN (hypertension)    Past Surgical History:  Procedure Laterality Date  . CARDIAC CATHETERIZATION    . COLONOSCOPY N/A 05/14/2015   POOR PREP  . COLONOSCOPY N/A 06/02/2015   SLF: 1. four colorectal polyps removed. no source for anemia identified. 2. the left colon is redundant 3. moderate sized internal hemorroids.   . ESOPHAGOGASTRODUODENOSCOPY N/A 05/14/2015   CHRONIC GASTRITIS  . GIVENS CAPSULE STUDY N/A 06/17/2015   Procedure: GIVENS CAPSULE STUDY;  Surgeon: Danie Binder, MD;  Location: [facility];  Service: Endoscopy;  Laterality: N/A;  0800    No Known Allergies  Outpatient Encounter Medications as of 07/22/2017  Medication Sig  . amLODipine (NORVASC) 2.5 MG tablet Take 5 mg by mouth daily.   Marland Kitchen amoxicillin-clavulanate (AUGMENTIN) 500-125 MG tablet Take 1 tablet by mouth 2 (two) times daily.  . Calcium Carbonate-Vitamin D (OSCAL 500/200 D-3 PO) 1 Tablet once a day by mouth . Can't be given with iron  . clopidogrel (PLAVIX) 75 MG tablet Take 75 mg by mouth daily.  Marland Kitchen docusate sodium (COLACE) 100 MG capsule Take 100 mg by mouth 2 (two) times daily.  . ferrous sulfate (QC FERROUS SULFATE) 325 (65 FE) MG tablet Take 325 mg by mouth daily.   Marland Kitchen labetalol (NORMODYNE) 200 MG tablet Take 200 mg by mouth 3 (three) times daily. Reported on 10/22/2015  . linaclotide (LINZESS) 145 MCG CAPS capsule Take 145 mcg by mouth daily before breakfast.  . lisinopril (PRINIVIL,ZESTRIL) 40 MG tablet Take 40 mg by mouth daily.  . metFORMIN (GLUCOPHAGE) 500 MG tablet Take 500 mg by mouth 2 (two) times daily with a meal.   . Multiple Vitamin (MULTIVITAMIN) tablet Take 1 tablet by mouth daily.  . Omega-3 Fatty Acids (FISH OIL) 1000 MG CAPS Take 1,000 mg by mouth 3 (three) times daily.  . pantoprazole (PROTONIX) 40 MG tablet Take 40 mg by mouth daily.  . polyethylene glycol (MIRALAX / GLYCOLAX) packet Take 17 g by mouth daily as needed.  . pravastatin (PRAVACHOL) 80 MG tablet Take 80 mg by mouth  daily.  . Probiotic Product (RISA-BID PROBIOTIC) TABS Take 1 tablet by mouth twice a day from //-//  . senna (SENOKOT) 8.6 MG TABS tablet Take 2 tablets (17.2 mg total) by mouth daily.  . tamsulosin (FLOMAX) 0.4 MG CAPS capsule Take 0.4 mg by mouth daily after supper.  . [DISCONTINUED] levofloxacin (LEVAQUIN) 500 MG tablet Take 500 mg by mouth daily.  . [DISCONTINUED] polyethylene glycol (MIRALAX / GLYCOLAX) packet Take 17 g by mouth daily. Give once a day for 1 week from 07/13/2017-07/19/2017   No facility-administered encounter medications on file as of 07/22/2017.     Review of Systems   General is not complaining of fever chills says he just does not feel good at times.  Skin is not complain of rashes or itching.  Head ears eyes nose mouth and throat is legally blind does see shadows  Respiratory is not complaining of shortness of breath at this point but  apparently had some with exertion yesterday --has occasional cough but not beyond baseline per nursing  Cardiac is not really complaining of chest pain today- says occasionally he has some thorax discomfort but says this has been going on on and off for about 6 months.  GI is not really complaining of abdominal discomfort nausea or vomiting has some history of constipation but per nursing is now having regular bowel movements.  GU is completing treatment for UTI is not specifically complaining of dysuria today.  Musculoskeletal says he just feels somewhat weak with somewhat diffuse complaints although this is really difficult to localize.  Neurologic is not complaining of dizziness or headache or syncope.  And psych appears to be a bit more subdued than usual but he is pleasant and appropriate  Immunization History  Administered Date(s) Administered  . Influenza,inj,Quad PF,6+ Mos 03/07/2015  . Influenza-Unspecified 02/03/2016, 02/04/2017  . PPD Test 03/10/2015, 03/24/2015  . Pneumococcal Conjugate-13 02/04/2017  .  Pneumococcal-Unspecified 02/10/2016  . Tdap 01/13/2017   Pertinent  Health Maintenance Due  Topic Date Due  . URINE MICROALBUMIN  08/11/2017 (Originally 06/25/2017)  . HEMOGLOBIN A1C  12/19/2017  . FOOT EXAM  01/26/2018  . OPHTHALMOLOGY EXAM  02/23/2018  . INFLUENZA VACCINE  Completed  . PNA vac Low Risk Adult  Completed   Fall Risk  01/10/2017  Falls in the past year? No   Functional Status Survey:    Vitals:   07/22/17 1324  BP: (!) 117/56  Pulse: 82  Resp: 20  Temp: 97.9 F (36.6 C)  TempSrc: Oral  SpO2: 94%    Physical Exam   In general this is a pleasant elderly male in no distress sitting comfortably in his wheelchair.  His skin is warm and dry he is not diaphoretic.  Eyes he is legally blind does have a chronic left eye opacity sees shadows.  Oropharynx is clear mucous membranes moist.  Chest is clear to auscultation with  shallow air entry could not really appreciate labored breathing.--Respiratory  effort is somewhat poor limits exam  Heart is regular rate and rhythm without murmur gallop or rub he has fairly minimal lower extremity edema.  His abdomen is soft obese does not really appear to be tender to palpation there is some reaction to the invasive maneuver but this does not give the appearance of acute tenderness.  GU could not really appreciate suprapubic tenderness although patient positioning made this somewhat difficult to fully assess  Neurologic he does have a slight mouth droop- speech is clear he is able to move his extremities at baseline.  Psych he alert  bright and alert but does not appear to be feeling like himself  Labs reviewed: Recent Labs    06/21/17 0743 07/05/17 0704 07/12/17 1420  NA 140 139 139  K 4.0 3.6 4.4  CL 106 105 106  CO2 22 23 20*  GLUCOSE 124* 118* 164*  BUN 15 12 15   CREATININE 0.80 0.84 1.05  CALCIUM 9.1 8.8* 9.1   Recent Labs    08/16/16 1430 07/05/17 0704 07/12/17 1420  AST 21 13* 16  ALT 18 10* 9*    ALKPHOS 54 62 62  BILITOT 0.2* 0.3 0.4  PROT 6.8 7.0 6.8  ALBUMIN 3.4* 3.2* 3.2*   Recent Labs    10/23/16 0420  07/05/17 0704 07/12/17 0716 07/22/17 1240  WBC 7.1   < > 11.5* 12.0* 18.3*  NEUTROABS 4.0  --   --  7.8* 14.2*  HGB 12.0*   < >  10.8* 11.0* 12.0*  HCT 36.0*   < > 34.2* 35.0* 36.6*  MCV 84.9   < > 85.5 84.3 82.1  PLT 260   < > 340 340 417*   < > = values in this interval not displayed.   Lab Results  Component Value Date   TSH 1.064 10/31/2015   Lab Results  Component Value Date   HGBA1C 7.3 (H) 06/21/2017   Lab Results  Component Value Date   CHOL 113 06/21/2017   HDL 34 (L) 06/21/2017   LDLCALC 63 06/21/2017   TRIG 81 06/21/2017   CHOLHDL 3.3 06/21/2017    Significant Diagnostic Results in last 30 days:  No results found.  Assessment/Plan  1- leukocytosis and renal insufficiency-white count is up to 18,300 and creatinine is up to 2.67 with a BUN of 58.  This is associated apparently with a poor appetite and complaints of malaise with somewhat diffuse complaints that are hard to localize.  Mobile x-ray is pending of chest  and abdomen but with the change in labs we will send him to the ER for expedient evaluation the white count has risen significantly as well as the creatinine-  Clinically he does not appear to be in any acute distress but he is not himself.  ADDENDUM-we have received the results of the mobile x-ray which does show a large increasing right base infiltrate and effusion-.  Again will send to the ER for evaluation it appears with the mobile x-ray shows possibly may be a respiratory pneumonia etiology--- with findings of a large right base infiltrate and right pleural effusion increased from March 12 --- again will await hospital workup and radiology as well  [id number]

## 2017-07-22 NOTE — Procedures (Signed)
PreOperative Dx: RIGHT pleural effusion Postoperative Dx: RIGHT pleural effusion Procedure:   US guided RIGHT thoracentesis Radiologist:  Thornton Papas Anesthesia:  10 ml of 1% lidocaine Specimen:  1.05 L of dark old bloody colored fluid EBL:   < 1 ml Complications: None

## 2017-07-22 NOTE — Progress Notes (Signed)
Dr. Myna Hidalgo paged and made aware that pts ordered as a Full Code when note from [facility] in earlier note stated pt has a DNR form from facility.  Rn also adv Dr. Myna Hidalgo that a new order for a Thoracentesis is placed even though pt had one earlier today; adv that Radiology stated it would be Monday before it could be done again Waiting for call back/orders.

## 2017-07-22 NOTE — ED Provider Notes (Signed)
[facility] EMERGENCY DEPARTMENT Provider Note   CSN: [id number] Arrival date & time: 07/22/17  1440     History   Chief Complaint Chief Complaint  Patient presents with  . Abnormal Lab    HPI [name] is a 83 y.o. male.  HPI Pt was seen at 1450. Per NH report, medical record, and pt, c/o gradual onset and persistence of constant "abnormal labs" today. NH states pt has had generalized malaise, decreased eating and drinking, and increasing SOB during therapy yesterday. Pt has not been "acting quite himself" for the past several days. Pt has recent hx pneumonia and UTI, being tx with augmentin. Pt states he "just feels sick" but cannot be more specific. No reported fevers, no N/V/D, no CP/palpitations, no abd pain, no new focal motor weakness.   Past Medical History:  Diagnosis Date  . Anemia due to chronic blood loss   . CAD (coronary artery disease)    cath 11/2007: 70% LM ISR, SVG-LAD OK, SVG-D1 100%, IMA-D2 OK, SVG-OM OK, 4-vessel CABG 2000.  Marland Kitchen CAD (coronary artery disease)    s/p of 95% L main artery stenosis, 4/03: cutting balloon PTCA of 80% in-stent restenosis, 7/04. preserved L ventriculr function  . Carotid bruit    bilateral. no sigificant distal abd atherosclerosis by recent cath.   . Chronic bronchitis ([place])   . Constipation   . COPD (chronic obstructive pulmonary disease) (HCC)    ongoing tobacco   . CVA (cerebral infarction)    left sided weakness  . DM2 (diabetes mellitus, type 2) ([place])   . Dyslipidemia   . Dysphagia   . Glaucoma   . HTN (hypertension)     Patient Active Problem List   Diagnosis Date Noted  . DM (diabetes mellitus) with peripheral vascular complication ([place]) 15/17/6160  . Anemia due to chronic blood loss 03/26/2015  . Constipation 03/06/2015  . Iron deficiency anemia 04/09/2013  . COPD (chronic obstructive pulmonary disease) ([place]) 11/18/2009  . HLD (hyperlipidemia) 11/18/2009  . Hypertension 11/18/2009  . INTERMEDIATE CORONARY  SYNDROME 11/18/2009  . Coronary atherosclerosis 11/18/2009  . PVD 11/18/2009  . History of cardioembolic cerebrovascular accident (CVA) 11/18/2009    Past Surgical History:  Procedure Laterality Date  . CARDIAC CATHETERIZATION    . COLONOSCOPY N/A 05/14/2015   POOR PREP  . COLONOSCOPY N/A 06/02/2015   SLF: 1. four colorectal polyps removed. no source for anemia identified. 2. the left colon is redundant 3. moderate sized internal hemorroids.   . ESOPHAGOGASTRODUODENOSCOPY N/A 05/14/2015   CHRONIC GASTRITIS  . GIVENS CAPSULE STUDY N/A 06/17/2015   Procedure: GIVENS CAPSULE STUDY;  Surgeon: Danie Binder, MD;  Location: [facility] ENDO SUITE;  Service: Endoscopy;  Laterality: N/A;  0800        Home Medications    Prior to Admission medications   Medication Sig Start Date End Date Taking? Authorizing Provider  amLODipine (NORVASC) 2.5 MG tablet Take 5 mg by mouth daily.  05/14/16   [provider]  amoxicillin-clavulanate (AUGMENTIN) 500-125 MG tablet Take 1 tablet by mouth 2 (two) times daily. 07/16/17 07/22/17  [provider]  Calcium Carbonate-Vitamin D (OSCAL 500/200 D-3 PO) 1 Tablet once a day by mouth . Can't be given with iron    [provider]  clopidogrel (PLAVIX) 75 MG tablet Take 75 mg by mouth daily.    [provider]  docusate sodium (COLACE) 100 MG capsule Take 100 mg by mouth 2 (two) times daily.    [provider]  ferrous sulfate (QC FERROUS SULFATE) 325 (65 FE) MG tablet Take 325 mg by mouth daily.     [provider]  labetalol (NORMODYNE) 200 MG tablet Take 200 mg by mouth 3 (three) times daily. Reported on 10/22/2015    [provider]  linaclotide (LINZESS) 145 MCG CAPS capsule Take 145 mcg by mouth daily before breakfast.    [provider]  lisinopril (PRINIVIL,ZESTRIL) 40 MG tablet Take 40 mg by mouth daily.    [provider]  metFORMIN (GLUCOPHAGE) 500 MG tablet Take 500 mg by mouth 2 (two)  times daily with a meal.     [provider]  Multiple Vitamin (MULTIVITAMIN) tablet Take 1 tablet by mouth daily.    [provider]  Omega-3 Fatty Acids (FISH OIL) 1000 MG CAPS Take 1,000 mg by mouth 3 (three) times daily.    [provider]  pantoprazole (PROTONIX) 40 MG tablet Take 40 mg by mouth daily.    [provider]  polyethylene glycol (MIRALAX / GLYCOLAX) packet Take 17 g by mouth daily as needed.    [provider]  pravastatin (PRAVACHOL) 80 MG tablet Take 80 mg by mouth daily.    [provider]  Probiotic Product (RISA-BID PROBIOTIC) TABS Take 1 tablet by mouth twice a day from //-//    [provider]  senna (SENOKOT) 8.6 MG TABS tablet Take 2 tablets (17.2 mg total) by mouth daily. 03/10/15   Janece Canterbury, MD  tamsulosin (FLOMAX) 0.4 MG CAPS capsule Take 0.4 mg by mouth daily after supper.    [provider]    Family History Family History  Problem Relation Age of Onset  . Hypertension Mother   . Stroke Maternal Grandmother   . Cancer Neg Hx   . Diabetes Neg Hx   . Heart disease Neg Hx     Social History Social History   Tobacco Use  . Smoking status: Former Smoker    Last attempt to quit: 08/03/2014    Years since quitting: 2.9  . Smokeless tobacco: Never Used  . Tobacco comment: 50 pack year hx ; not smoking @ [facility]  Substance Use Topics  . Alcohol use: No    Alcohol/week: 0.0 oz    Comment: has not had alcohol in 30-40 years   . Drug use: No     Allergies   Patient has no known allergies.   Review of Systems Review of Systems ROS: Statement: All systems negative except as marked or noted in the HPI; Constitutional: Negative for fever and chills. +generalized weakness, "not acting himself," poor PO intake. ; ; Eyes: Negative for eye pain, redness and discharge. ; ; ENMT: Negative for ear pain, hoarseness, nasal congestion, sinus pressure and sore throat. ; ; Cardiovascular: Negative  for chest pain, palpitations, diaphoresis, and peripheral edema. ; ; Respiratory: +SOB. Negative for cough, wheezing and stridor. ; ; Gastrointestinal: Negative for nausea, vomiting, diarrhea, abdominal pain, blood in stool, hematemesis, jaundice and rectal bleeding. . ; ; Genitourinary: Negative for dysuria, flank pain and hematuria. ; ; Musculoskeletal: Negative for back pain and neck pain. Negative for swelling and trauma.; ; Skin: Negative for pruritus, rash, abrasions, blisters, bruising and skin lesion.; ; Neuro: Negative for headache, lightheadedness and neck stiffness. Negative for altered level of consciousness, altered mental status, extremity weakness, paresthesias, involuntary movement, seizure and syncope.       Physical Exam Updated Vital Signs BP (!) 142/62   Pulse 94  Temp 98.6 F (37 C) (Oral)   Resp (!) 22   Ht 5\' 9"  (1.753 m)   Wt 83.9 kg (185 lb)   SpO2 90%   BMI 27.32 kg/m    Patient Vitals for the past 24 hrs:  BP Temp Temp src Pulse Resp SpO2 Height Weight  07/22/17 1456 (!) 142/62 98.6 F (37 C) Oral 94 (!) 22 90 % - -  07/22/17 1443 - - - - - - 5\' 9"  (1.753 m) 83.9 kg (185 lb)     Physical Exam 1455: Physical examination:  Nursing notes reviewed; Vital signs and O2 SAT reviewed;  Constitutional: Well developed, Well nourished, In no acute distress; Head:  Normocephalic, atraumatic; Eyes: EOMI, PERRL, No scleral icterus; ENMT: Mouth and pharynx normal, Mucous membranes dry; Neck: Supple, Full range of motion, No lymphadenopathy; Cardiovascular: Regular rate and rhythm, No gallop; Respiratory: Breath sounds coarse & equal bilaterally, No wheezes. Speaking sentences, mild tachypnea. Normal respiratory effort/excursion; Chest: Nontender, Movement normal; Abdomen: Soft, Nontender, Nondistended, Normal bowel sounds; Genitourinary: No CVA tenderness; Extremities: Peripheral pulses normal, No tenderness, No edema, No calf edema or asymmetry.; Neuro: AA&Ox3, Major CN  grossly intact.  Speech clear. Left sided weakness per hx, otherwise no new gross focal motor deficits in extremities.; Skin: Color normal, Warm, Dry.   ED Treatments / Results  Labs (all labs ordered are listed, but only abnormal results are displayed)   EKG  EKG Interpretation  Date/Time:  Friday July 22 2017 16:51:44 EDT Ventricular Rate:  71 PR Interval:    QRS Duration: 126 QT Interval:  418 QTC Calculation: 455 R Axis:   -25 Text Interpretation:  Sinus rhythm Right bundle branch block LVH with IVCD and secondary repol abnrm T wave abnormality Diffuse When compared with ECG of 04/05/2015 T wave abnormality Inferior leads has improved T wave abnormality Anterolateral leads is present Confirmed by Francine Graven [phone]) on 07/22/2017 5:08:04 PM        Radiology   Procedures Procedures (including critical care time)  Medications Ordered in ED Medications - No data to display   Initial Impression / Assessment and Plan / ED Course  I have reviewed the triage vital signs and the nursing notes.  Pertinent labs & imaging results that were available during my care of the patient were reviewed by me and considered in my medical decision making (see chart for details).  MDM Reviewed: previous chart, nursing note and vitals Reviewed previous: labs and ECG Interpretation: labs, ECG, x-ray and ultrasound    Results for orders placed or performed during the hospital encounter of 07/22/17  CBC with Differential  Result Value Ref Range   WBC 18.5 (H) 4.0 - 10.5 K/uL   RBC 4.21 (L) 4.22 - 5.81 MIL/uL   Hemoglobin 11.1 (L) 13.0 - 17.0 g/dL   HCT 35.0 (L) 39.0 - 52.0 %   MCV 83.1 78.0 - 100.0 fL   MCH 26.4 26.0 - 34.0 pg   MCHC 31.7 30.0 - 36.0 g/dL   RDW 13.8 11.5 - 15.5 %   Platelets 422 (H) 150 - 400 K/uL   Neutrophils Relative % 78 %   Neutro Abs 14.5 (H) 1.7 - 7.7 K/uL   Lymphocytes Relative 10 %   Lymphs Abs 1.9 0.7 - 4.0 K/uL   Monocytes Relative 7 %    Monocytes Absolute 1.3 (H) 0.1 - 1.0 K/uL   Eosinophils Relative 4 %   Eosinophils Absolute 0.7 0.0 - 0.7 K/uL   Basophils Relative 1 %  Basophils Absolute 0.1 0.0 - 0.1 K/uL  Comprehensive metabolic panel  Result Value Ref Range   Sodium 139 135 - 145 mmol/L   Potassium 4.3 3.5 - 5.1 mmol/L   Chloride 108 101 - 111 mmol/L   CO2 17 (L) 22 - 32 mmol/L   Glucose, Bld 196 (H) 65 - 99 mg/dL   BUN 58 (H) 6 - 20 mg/dL   Creatinine, Ser 2.97 (H) 0.61 - 1.24 mg/dL   Calcium 8.8 (L) 8.9 - 10.3 mg/dL   Total Protein 7.6 6.5 - 8.1 g/dL   Albumin 3.2 (L) 3.5 - 5.0 g/dL   AST 15 15 - 41 U/L   ALT 12 (L) 17 - 63 U/L   Alkaline Phosphatase 54 38 - 126 U/L   Total Bilirubin 0.6 0.3 - 1.2 mg/dL   GFR calc non Af Amer 18 (L) >60 mL/min   GFR calc Af Amer 21 (L) >60 mL/min   Anion gap 14 5 - 15  Troponin I  Result Value Ref Range   Troponin I 0.03 (HH) <0.03 ng/mL  Brain natriuretic peptide  Result Value Ref Range   B Natriuretic Peptide 79.0 0.0 - 100.0 pg/mL   Dg Chest 1 View Result Date: 07/22/2017 CLINICAL DATA:  RIGHT pleural effusion post thoracentesis with removal of 1.05 L of fluid from the RIGHT chest EXAM: CHEST  1 VIEW COMPARISON:  Earlier study of 07/22/2017 FINDINGS: Enlargement of cardiac silhouette post CABG. Atherosclerotic calcification aorta. Persistent large RIGHT pleural effusion with significant atelectasis of the mid to lower RIGHT lung. Minimal LEFT basilar atelectasis. No pneumothorax. Osseous structures unremarkable. IMPRESSION: Persistent large RIGHT pleural effusion and significant atelectasis of the mid to lower RIGHT lung despite thoracentesis. No pneumothorax. Electronically Signed   By: Lavonia Dana M.D.   On: 07/22/2017 16:55   Dg Chest 2 View Result Date: 07/22/2017 CLINICAL DATA:  Acutely elevated BUN and white blood cell count. Headache today. EXAM: CHEST - 2 VIEW COMPARISON:  Single-view of the chest 04/05/2015. FINDINGS: The patient has a large right pleural  effusion with extensive airspace disease. The left lung is clear. There is cardiomegaly. The patient is status post CABG. Atherosclerosis is noted. IMPRESSION: Large right pleural effusion and associated airspace disease which cannot be definitively characterized. Cardiomegaly. Electronically Signed   By: Inge Rise M.D.   On: 07/22/2017 15:26   US Abdomen Complete Result Date: 07/22/2017 CLINICAL DATA:  Elevated BUN and creatinine, does not feel good, RIGHT pleural effusion by chest radiograph EXAM: ABDOMEN ULTRASOUND COMPLETE COMPARISON:  CT abdomen and pelvis 02/02/2015 FINDINGS: Gallbladder: Shadowing gallstone 13 mm diameter within gallbladder. No gallbladder wall thickening, pericholecystic fluid or sonographic Murphy sign. Common bile duct: Diameter: Normal caliber 3 mm diameter Liver: Upper normal hepatic echogenicity. No discrete hepatic mass lesion. Portal vein is patent on color Doppler imaging with normal direction of blood flow towards the liver. IVC: Normal appearance Pancreas: Visualized portion of pancreatic body and proximal tail normal appearance, remainder obscured by bowel gas Spleen: Inadequately visualized due to high subcostal position Right Kidney: Length: 10.9 cm. Normal morphology without mass or hydronephrosis. Left Kidney: Length: 10.6 cm. Normal morphology without mass or hydronephrosis. Abdominal aorta: Normal caliber at proximal and mid portions, bifurcation obscured by bowel gas Other findings: No free fluid IMPRESSION: Cholelithiasis without evidence acute cholecystitis. Incomplete visualization of pancreas, spleen, and distal aorta. Electronically Signed   By: Lavonia Dana M.D.   On: 07/22/2017 16:39    US Thoracentesis Asp Pleural Space  W/img Guide Result Date: 07/22/2017 INDICATION: Large RIGHT pleural effusion EXAM: ULTRASOUND GUIDED DIAGNOSTIC AND THERAPEUTIC RIGHT THORACENTESIS MEDICATIONS: None COMPLICATIONS: None immediate. PROCEDURE: Procedure, benefits, and  risks of procedure were discussed with patient. Written informed consent for procedure was obtained. Time out protocol followed. Pleural effusion localized by ultrasound at the posterior RIGHT hemithorax. Skin prepped and draped in usual sterile fashion. Skin and soft tissues anesthetized with 10 mL of 1% lidocaine. 8 French thoracentesis catheter placed into the RIGHT pleural space. 1.05 L of dark old bloody fluid aspirated by syringe and vacuum bottle suction. Procedure tolerated well by patient without immediate complication. FINDINGS: A total of approximately 1.05 L of RIGHT pleural fluid was removed. Samples were sent to the laboratory as requested by the clinical team. IMPRESSION: Successful ultrasound guided RIGHT thoracentesis yielding 1.05 L of pleural fluid. Electronically Signed   By: Lavonia Dana M.D.   On: 07/22/2017 16:46      Ct Chest Wo Contrast Result Date: 07/22/2017 CLINICAL DATA:  RIGHT pleural effusion EXAM: CT CHEST WITHOUT CONTRAST TECHNIQUE: Multidetector CT imaging of the chest was performed following the standard protocol without IV contrast. Sagittal and coronal MPR images reconstructed from axial data set. IV contrast not utilized due to renal dysfunction. COMPARISON:  Chest radiograph 07/22/2017 FINDINGS: Cardiovascular: Atherosclerotic calcifications aorta, proximal great vessels and coronary arteries. Aorta normal caliber. No pericardial effusion. Mediastinum/Nodes: Esophagus unremarkable. Base of cervical region normal appearance. Enlarged precarinal lymph node 15 mm short axis image 55. Additional scattered normal sized mediastinal nodes. Hilar assessment limited by lack of IV contrast but RIGHT hilum appears slightly enlarged adenopathy questioned. Lungs/Pleura: Large RIGHT pleural effusion. Complete atelectasis of RIGHT lower lobe. Complete atelectasis of RIGHT middle lobe. Partial atelectasis of RIGHT upper lobe. Abnormal soft tissue density at the anteromedial mid RIGHT  chest highly suspicious for pleural based tumor. This measures up to 5.1 x 3.5 cm image 67 extends approximately 6.1 cm craniocaudal length. This abuts both the anterior chest wall and the mediastinum. Questionable area of minimal pleural thickening at the inferomedial RIGHT hemithorax. 9 mm LEFT mid lung nodule at major fissure image 73. Question additional midlung nodule 6 mm diameter image 70. Tiny calcified granuloma LEFT upper lobe. Question additional 5 mm LEFT upper lobe nodule. Tiny RIGHT upper lobe pulmonary nodules sagittal images 48 and 54. Upper Abdomen: Unremarkable prior median sternotomy. Musculoskeletal: No acute osseous findings. IMPRESSION: Large RIGHT pleural effusion with complete atelectasis of the RIGHT middle and RIGHT lower lobes and partial atelectasis of the RIGHT upper lobe. 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 BILATERAL lung nodules. a Extensive atherosclerotic calcifications including coronary arteries. Aortic Atherosclerosis (ICD10-I70.0). Electronically Signed   By: Lavonia Dana M.D.   On: 07/22/2017 19:09    Results for GARRON, ELINE (MRN [id number]) as of 07/22/2017 17:10  Ref. Range 03/15/2017 04:00 06/21/2017 07:43 07/05/2017 07:04 07/12/2017 14:20 07/22/2017 12:40 07/22/2017 15:06  BUN Latest Ref Range: 6 - 20 mg/dL 14 15 12 15  58 (H) 58 (H)  Creatinine Latest Ref Range: 0.61 - 1.24 mg/dL 0.81 0.80 0.84 1.05 2.67 (H) 2.97 (H)     1935:  Large right pleural effusion persists despite thoracentesis. Follow up CT as above. Judicious IVF given for elevated BUN/Cr. Bladder scan with 29ml, doubt BPH as cause for elevated BUN/Cr at this time.  T/C returned from [facility] Dr. Myna Hidalgo, case discussed, including:  HPI, pertinent PM/SHx, VS/PE, dx testing, ED  course and treatment:  Agreeable to admit.     Final Clinical Impressions(s) / ED Diagnoses   Final diagnoses:  Dyspnea    ED Discharge Orders    None         Francine Graven, DO 07/24/17 1636

## 2017-07-22 NOTE — ED Triage Notes (Signed)
Pt sent from [facility] for evaluation of acutely elevated BUN and Creatinine with WBC 18.3.  Pt states he has a headache.

## 2017-07-23 ENCOUNTER — Other Ambulatory Visit: Payer: Self-pay

## 2017-07-23 DIAGNOSIS — J42 Unspecified chronic bronchitis: Secondary | ICD-10-CM

## 2017-07-23 LAB — GLUCOSE, CAPILLARY
GLUCOSE-CAPILLARY: 106 mg/dL — AB (ref 65–99)
GLUCOSE-CAPILLARY: 137 mg/dL — AB (ref 65–99)
Glucose-Capillary: 104 mg/dL — ABNORMAL HIGH (ref 65–99)
Glucose-Capillary: 107 mg/dL — ABNORMAL HIGH (ref 65–99)
Glucose-Capillary: 160 mg/dL — ABNORMAL HIGH (ref 65–99)
Glucose-Capillary: 175 mg/dL — ABNORMAL HIGH (ref 65–99)
Glucose-Capillary: 93 mg/dL (ref 65–99)

## 2017-07-23 LAB — CBC WITH DIFFERENTIAL/PLATELET
BASOS ABS: 0.1 10*3/uL (ref 0.0–0.1)
Basophils Relative: 0 %
Eosinophils Absolute: 1 10*3/uL — ABNORMAL HIGH (ref 0.0–0.7)
Eosinophils Relative: 6 %
HEMATOCRIT: 37.5 % — AB (ref 39.0–52.0)
Hemoglobin: 12.2 g/dL — ABNORMAL LOW (ref 13.0–17.0)
LYMPHS PCT: 11 %
Lymphs Abs: 1.9 10*3/uL (ref 0.7–4.0)
MCH: 27.1 pg (ref 26.0–34.0)
MCHC: 32.5 g/dL (ref 30.0–36.0)
MCV: 83.1 fL (ref 78.0–100.0)
MONO ABS: 1.8 10*3/uL — AB (ref 0.1–1.0)
Monocytes Relative: 10 %
NEUTROS ABS: 12.3 10*3/uL — AB (ref 1.7–7.7)
Neutrophils Relative %: 73 %
Platelets: 421 10*3/uL — ABNORMAL HIGH (ref 150–400)
RBC: 4.51 MIL/uL (ref 4.22–5.81)
RDW: 14.1 % (ref 11.5–15.5)
WBC: 16.9 10*3/uL — ABNORMAL HIGH (ref 4.0–10.5)

## 2017-07-23 LAB — URINALYSIS, ROUTINE W REFLEX MICROSCOPIC
Bilirubin Urine: NEGATIVE
GLUCOSE, UA: NEGATIVE mg/dL
Hgb urine dipstick: NEGATIVE
KETONES UR: 5 mg/dL — AB
LEUKOCYTES UA: NEGATIVE
Nitrite: NEGATIVE
PH: 5 (ref 5.0–8.0)
Protein, ur: NEGATIVE mg/dL
SPECIFIC GRAVITY, URINE: 1.02 (ref 1.005–1.030)

## 2017-07-23 LAB — ACID FAST SMEAR (AFB): ACID FAST SMEAR - AFSCU2: NEGATIVE

## 2017-07-23 LAB — BASIC METABOLIC PANEL
Anion gap: 13 (ref 5–15)
BUN: 57 mg/dL — ABNORMAL HIGH (ref 6–20)
CHLORIDE: 111 mmol/L (ref 101–111)
CO2: 18 mmol/L — ABNORMAL LOW (ref 22–32)
Calcium: 8.7 mg/dL — ABNORMAL LOW (ref 8.9–10.3)
Creatinine, Ser: 2.05 mg/dL — ABNORMAL HIGH (ref 0.61–1.24)
GFR calc Af Amer: 34 mL/min — ABNORMAL LOW (ref >60)
GFR, EST NON AFRICAN AMERICAN: 29 mL/min — AB (ref >60)
GLUCOSE: 108 mg/dL — AB (ref 65–99)
POTASSIUM: 4.4 mmol/L (ref 3.5–5.1)
Sodium: 142 mmol/L (ref 135–145)

## 2017-07-23 LAB — TROPONIN I
TROPONIN I: 0.03 ng/mL — AB (ref <0.03)
Troponin I: <0.03 ng/mL (ref <0.03)

## 2017-07-23 LAB — ACID FAST SMEAR (AFB, MYCOBACTERIA)

## 2017-07-23 LAB — GLUCOSE, BODY FLUID OTHER: Glucose, Body Fluid Other: 118 mg/dL

## 2017-07-23 LAB — SODIUM, URINE, RANDOM: Sodium, Ur: 17 mmol/L

## 2017-07-23 LAB — MRSA PCR SCREENING: MRSA by PCR: NEGATIVE

## 2017-07-23 LAB — CREATININE, URINE, RANDOM: Creatinine, Urine: 369.66 mg/dL

## 2017-07-23 NOTE — Progress Notes (Signed)
PROGRESS NOTE    [name]  GYK:[id number] DOB: 06-25-1934 DOA: 07/22/2017 PCP: [name], MD    Brief Narrative:  83 year old male, resident of a nursing facility, history of hypertension diabetes, coronary artery disease and recent pneumonia status post treatment with Levaquin, presented to the emergency room with shortness of breath, cough and generalized weakness.  Found to have large right pleural effusion with possible pleural-based mass.  Underwent thoracentesis in the emergency room with removal of 1 L of fluid.  Postthoracentesis x-ray showed persistent large right pleural effusion.  He is been admitted to the hospital for further management.  Pulmonology following and feels that he may need Pleurx catheter.  Cytology from fluid is in process.  If this is been negative, he may need biopsy of mass.   Assessment & Plan:   Principal Problem:   Pleural effusion on right Active Problems:   COPD (chronic obstructive pulmonary disease) (HCC)   Hypertension   Coronary atherosclerosis   AKI (acute kidney injury) (HCC)   Elevated troponin   DM (diabetes mellitus) with peripheral vascular complication (HCC)   Iron deficiency anemia   Mass in chest   Lung nodules   Recurrent right pleural effusion   UTI (urinary tract infection)   1. Right-sided pleural effusion with pleural-based mass.  Patient underwent thoracentesis on admission.  He had 1 L of fluid removed from right side of chest.  Follow-up chest x-ray shows persistent large right pleural effusion.  He recently completed a course of Levaquin for suspected pneumonia.  Pulmonology consulted, appreciate input.  Fluid from thoracentesis has been sent for cytology.  He may need Pleurx catheter.  Repeat thoracentesis has been requested. 2. Acute kidney injury.  Creatinine of 2.97 on admission.  Renal ultrasound does not show any evidence of hydronephrosis.  Creatinine improving with hydration.  Continue current  treatments. 3. Urinary tract infection.  Was recently started on Augmentin as an outpatient.  Complete Augmentin for urinary tract infection. 4. Coronary artery disease.  No complaints of chest pain at this time. 5. And significant rise of troponin.  No EKG changes.  Patient is chronically on aspirin and Plavix.  Will hold further Plavix since he may need repeat thoracentesis and may also need Pleurx catheter placement. 6. Diabetes.  Recent A1c of 7.3.  Metformin has been held on admission.  Continue on sliding scale insulin.   DVT prophylaxis: Heparin Code Status: DNR Family Communication: No family present Disposition Plan: Return to nursing facility on discharge, once workup has been completed.  May need repeat thoracentesis prior to discharge   Consultants:   Pulmonology  Procedures:   3/22 ultrasound-guided right-sided thoracentesis with removal of 1 L  Antimicrobials:   Augmentin   Subjective: He is feeling better today.  Feels that shortness of breath is improving.  Had some nausea and some vomiting earlier today.  Objective: Vitals:   07/22/17 2030 07/22/17 2100 07/23/17 0600 07/23/17 1416  BP: 135/63 (!) 137/48 113/63 134/66  Pulse:  65 78 88  Resp: 16 16 17 18   Temp:  98.1 F (36.7 C) 97.7 F (36.5 C) 99.1 F (37.3 C)  TempSrc:  Oral Oral Oral  SpO2:  99% 98% 95%  Weight:  84 kg (185 lb 3 oz)    Height:  5\' 9"  (1.753 m)      Intake/Output Summary (Last 24 hours) at 07/23/2017 1907 Last data filed at 07/23/2017 1036 Gross per 24 hour  Intake 691.67 ml  Output 450 ml  Net 241.67 ml   Filed Weights   07/22/17 1443 07/22/17 2100  Weight: 83.9 kg (185 lb) 84 kg (185 lb 3 oz)    Examination:  General exam: Appears calm and comfortable  Respiratory system: Diminished breath sounds on right side. Respiratory effort normal. Cardiovascular system: S1 & S2 heard, RRR. No JVD, murmurs, rubs, gallops or clicks. No pedal edema. Gastrointestinal system: Abdomen  is nondistended, soft and nontender. No organomegaly or masses felt. Normal bowel sounds heard. Central nervous system: Alert and oriented. No focal neurological deficits. Extremities: Symmetric 5 x 5 power. Skin: No rashes, lesions or ulcers Psychiatry: Judgement and insight appear normal. Mood & affect appropriate.     Data Reviewed: I have personally reviewed following labs and imaging studies  CBC: Recent Labs  Lab 07/22/17 1240 07/22/17 1506 07/23/17 0617  WBC 18.3* 18.5* 16.9*  NEUTROABS 14.2* 14.5* 12.3*  HGB 12.0* 11.1* 12.2*  HCT 36.6* 35.0* 37.5*  MCV 82.1 83.1 83.1  PLT 417* 422* 709*   Basic Metabolic Panel: Recent Labs  Lab 07/22/17 1240 07/22/17 1506 07/23/17 0617  NA 138 139 142  K 5.0 4.3 4.4  CL 107 108 111  CO2 16* 17* 18*  GLUCOSE 152* 196* 108*  BUN 58* 58* 57*  CREATININE 2.67* 2.97* 2.05*  CALCIUM 9.2 8.8* 8.7*   GFR: Estimated Creatinine Clearance: 28.7 mL/min (A) (by C-G formula based on SCr of 2.05 mg/dL (H)). Liver Function Tests: Recent Labs  Lab 07/22/17 1240 07/22/17 1506  AST 17 15  ALT 11* 12*  ALKPHOS 58 54  BILITOT 0.6 0.6  PROT 7.4 7.6  ALBUMIN 3.2* 3.2*   No results for input(s): LIPASE, AMYLASE in the last 168 hours. No results for input(s): AMMONIA in the last 168 hours. Coagulation Profile: No results for input(s): INR, PROTIME in the last 168 hours. Cardiac Enzymes: Recent Labs  Lab 07/22/17 1507 07/22/17 2243 07/23/17 0617 07/23/17 1054  TROPONINI 0.03* 0.03* 0.03* <0.03   BNP (last 3 results) No results for input(s): PROBNP in the last 8760 hours. HbA1C: No results for input(s): HGBA1C in the last 72 hours. CBG: Recent Labs  Lab 07/23/17 0000 07/23/17 0451 07/23/17 0747 07/23/17 1116 07/23/17 1712  GLUCAP 107* 104* 106* 137* 175*   Lipid Profile: No results for input(s): CHOL, HDL, LDLCALC, TRIG, CHOLHDL, LDLDIRECT in the last 72 hours. Thyroid Function Tests: Recent Labs    07/22/17 1240   TSH 1.483   Anemia Panel: No results for input(s): VITAMINB12, FOLATE, FERRITIN, TIBC, IRON, RETICCTPCT in the last 72 hours. Sepsis Labs: No results for input(s): PROCALCITON, LATICACIDVEN in the last 168 hours.  Recent Results (from the past 240 hour(s))  Gram stain     Status: None   Collection Time: 07/22/17  4:20 PM  Result Value Ref Range Status   Specimen Description THORACENTESIS  Final   Special Requests NONE  Final   Gram Stain   Final    CYTOSPIN SMEAR NO ORGANISMS SEEN WBC PRESENT,BOTH PMN AND MONONUCLEAR Performed at [facility], [address], [place], [place] [address]    Report Status 07/22/2017 FINAL  Final  Acid Fast Smear (AFB)     Status: None   Collection Time: 07/22/17  4:20 PM  Result Value Ref Range Status   AFB Specimen Processing Concentration  Final   Acid Fast Smear Negative  Final    Comment: (NOTE) Performed At: [facility] [address] [place], [place] [id number] Rush Farmer MD TK:[id number]  Source (AFB) PLEURAL  Final    Comment: Performed at [facility], [address], [place], [place] [address]  Culture, body fluid-bottle     Status: None (Preliminary result)   Collection Time: 07/22/17  4:20 PM  Result Value Ref Range Status   Specimen Description PLEURAL  Final   Special Requests [facility]  Final   Culture   Final    NO GROWTH < 24 HOURS Performed at [facility], [address], [place], [place] [address]    Report Status PENDING  Incomplete  MRSA PCR Screening     Status: None   Collection Time: 07/22/17  9:22 PM  Result Value Ref Range Status   MRSA by PCR NEGATIVE NEGATIVE Final    Comment:        The GeneXpert MRSA Assay (FDA approved for NASAL specimens only), is one component of a comprehensive MRSA colonization surveillance program. It is not intended to diagnose MRSA infection nor to guide or monitor treatment for MRSA infections. Performed at [facility], [address] [place], [place]  [address]          Radiology Studies: Dg Chest 1 View  Result Date: 07/22/2017 CLINICAL DATA:  RIGHT pleural effusion post thoracentesis with removal of 1.05 L of fluid from the RIGHT chest EXAM: CHEST  1 VIEW COMPARISON:  Earlier study of 07/22/2017 FINDINGS: Enlargement of cardiac silhouette post CABG. Atherosclerotic calcification aorta. Persistent large RIGHT pleural effusion with significant atelectasis of the mid to lower RIGHT lung. Minimal LEFT basilar atelectasis. No pneumothorax. Osseous structures unremarkable. IMPRESSION: Persistent large RIGHT pleural effusion and significant atelectasis of the mid to lower RIGHT lung despite thoracentesis. No pneumothorax. Electronically Signed   By: Lavonia Dana M.D.   On: 07/22/2017 16:55   Dg Chest 2 View  Result Date: 07/22/2017 CLINICAL DATA:  Acutely elevated BUN and white blood cell count. Headache today. EXAM: CHEST - 2 VIEW COMPARISON:  Single-view of the chest 04/05/2015. FINDINGS: The patient has a large right pleural effusion with extensive airspace disease. The left lung is clear. There is cardiomegaly. The patient is status post CABG. Atherosclerosis is noted. IMPRESSION: Large right pleural effusion and associated airspace disease which cannot be definitively characterized. Cardiomegaly. Electronically Signed   By: Inge Rise M.D.   On: 07/22/2017 15:26   Ct Chest Wo Contrast  Result Date: 07/22/2017 CLINICAL DATA:  RIGHT pleural effusion EXAM: CT CHEST WITHOUT CONTRAST TECHNIQUE: Multidetector CT imaging of the chest was performed following the standard protocol without IV contrast. Sagittal and coronal MPR images reconstructed from axial data set. IV contrast not utilized due to renal dysfunction. COMPARISON:  Chest radiograph 07/22/2017 FINDINGS: Cardiovascular: Atherosclerotic calcifications aorta, proximal great vessels and coronary arteries. Aorta normal caliber. No pericardial effusion. Mediastinum/Nodes: Esophagus  unremarkable. Base of cervical region normal appearance. Enlarged precarinal lymph node 15 mm short axis image 55. Additional scattered normal sized mediastinal nodes. Hilar assessment limited by lack of IV contrast but RIGHT hilum appears slightly enlarged adenopathy questioned. Lungs/Pleura: Large RIGHT pleural effusion. Complete atelectasis of RIGHT lower lobe. Complete atelectasis of RIGHT middle lobe. Partial atelectasis of RIGHT upper lobe. Abnormal soft tissue density at the anteromedial mid RIGHT chest highly suspicious for pleural based tumor. This measures up to 5.1 x 3.5 cm image 67 extends approximately 6.1 cm craniocaudal length. This abuts both the anterior chest wall and the mediastinum. Questionable area of minimal pleural thickening at the inferomedial RIGHT hemithorax. 9 mm LEFT mid lung nodule at major  fissure image 73. Question additional midlung nodule 6 mm diameter image 70. Tiny calcified granuloma LEFT upper lobe. Question additional 5 mm LEFT upper lobe nodule. Tiny RIGHT upper lobe pulmonary nodules sagittal images 48 and 54. Upper Abdomen: Unremarkable prior median sternotomy. Musculoskeletal: No acute osseous findings. IMPRESSION: Large RIGHT pleural effusion with complete atelectasis of the RIGHT middle and RIGHT lower lobes and partial atelectasis of the RIGHT upper lobe. 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 BILATERAL lung nodules. a Extensive atherosclerotic calcifications including coronary arteries. Aortic Atherosclerosis (ICD10-I70.0). Electronically Signed   By: Lavonia Dana M.D.   On: 07/22/2017 19:09   US Abdomen Complete  Result Date: 07/22/2017 CLINICAL DATA:  Elevated BUN and creatinine, does not feel good, RIGHT pleural effusion by chest radiograph EXAM: ABDOMEN ULTRASOUND COMPLETE COMPARISON:  CT abdomen and pelvis 02/02/2015 FINDINGS: Gallbladder: Shadowing gallstone 13 mm diameter within gallbladder. No  gallbladder wall thickening, pericholecystic fluid or sonographic Murphy sign. Common bile duct: Diameter: Normal caliber 3 mm diameter Liver: Upper normal hepatic echogenicity. No discrete hepatic mass lesion. Portal vein is patent on color Doppler imaging with normal direction of blood flow towards the liver. IVC: Normal appearance Pancreas: Visualized portion of pancreatic body and proximal tail normal appearance, remainder obscured by bowel gas Spleen: Inadequately visualized due to high subcostal position Right Kidney: Length: 10.9 cm. Normal morphology without mass or hydronephrosis. Left Kidney: Length: 10.6 cm. Normal morphology without mass or hydronephrosis. Abdominal aorta: Normal caliber at proximal and mid portions, bifurcation obscured by bowel gas Other findings: No free fluid IMPRESSION: Cholelithiasis without evidence acute cholecystitis. Incomplete visualization of pancreas, spleen, and distal aorta. Electronically Signed   By: Lavonia Dana M.D.   On: 07/22/2017 16:39   US Thoracentesis Asp Pleural Space W/img Guide  Result Date: 07/22/2017 INDICATION: Large RIGHT pleural effusion EXAM: ULTRASOUND GUIDED DIAGNOSTIC AND THERAPEUTIC RIGHT THORACENTESIS MEDICATIONS: None COMPLICATIONS: None immediate. PROCEDURE: Procedure, benefits, and risks of procedure were discussed with patient. Written informed consent for procedure was obtained. Time out protocol followed. Pleural effusion localized by ultrasound at the posterior RIGHT hemithorax. Skin prepped and draped in usual sterile fashion. Skin and soft tissues anesthetized with 10 mL of 1% lidocaine. 8 French thoracentesis catheter placed into the RIGHT pleural space. 1.05 L of dark old bloody fluid aspirated by syringe and vacuum bottle suction. Procedure tolerated well by patient without immediate complication. FINDINGS: A total of approximately 1.05 L of RIGHT pleural fluid was removed. Samples were sent to the laboratory as requested by the  clinical team. IMPRESSION: Successful ultrasound guided RIGHT thoracentesis yielding 1.05 L of pleural fluid. Electronically Signed   By: Lavonia Dana M.D.   On: 07/22/2017 16:46        Scheduled Meds: . acidophilus  1 capsule Oral BID  . amoxicillin-clavulanate  1 tablet Oral BID  . clopidogrel  75 mg Oral Daily  . docusate sodium  100 mg Oral BID  . heparin  5,000 Units Subcutaneous Q8H  . insulin aspart  0-9 Units Subcutaneous Q4H  . labetalol  200 mg Oral Q8H  . linaclotide  145 mcg Oral QAC breakfast  . mouth rinse  15 mL Mouth Rinse BID  . multivitamin with minerals  1 tablet Oral Daily  . omega-3 acid ethyl esters  1 g Oral BID  . pantoprazole  40 mg Oral Daily  . pravastatin  80 mg Oral q1800  . senna  2  tablet Oral Daily  . tamsulosin  0.4 mg Oral QPC supper   Continuous Infusions:   LOS: 1 day    Time spent: 25 mins    Kathie Dike, MD [facility] Pager [phone]  If 7PM-7AM, please contact night-coverage [email or web address] Password [facility] 07/23/2017, 7:07 PM

## 2017-07-23 NOTE — Progress Notes (Signed)
Pt has several orders to collect urine. Pt states he didn't feel like he had to urinate. This RN scanned pts bladder-211mL of urine. I&O cath performed via order from earlier that was not completed and 366mL retrieved from I&O. Urine sent to lab. Will continue to monitor pt

## 2017-07-23 NOTE — Consult Note (Signed)
Consult requested by: [facility] hospitalist, Dr. Roderic Palau Consult requested for: Pleural effusion abnormal chest CT  HPI: This is an 83 year old who came from a skilled care facility with lethargy not eating or drinking but with no other specific complaints.  He had been treated for pneumonia with Levaquin and has been taking Augmentin for a urinary tract infection.  He stopped smoking about 3 years ago.  He says he is very tired today.  He had chest x-ray that showed a large right pleural effusion and then had a CT that showed a large right pleural effusion complete atelectasis of the right middle and lower lobes and what looked like a pleural-based tumor.  I personally reviewed both studies.  He has past medical history of coronary disease previous CVA COPD diabetes.  He is not having any chest pain.  He is not having any hemoptysis.  He has not had fever or chills.  Past Medical History:  Diagnosis Date  . Anemia due to chronic blood loss   . CAD (coronary artery disease)    cath 11/2007: 70% LM ISR, SVG-LAD OK, SVG-D1 100%, IMA-D2 OK, SVG-OM OK, 4-vessel CABG 2000.  Marland Kitchen CAD (coronary artery disease)    s/p of 95% L main artery stenosis, 4/03: cutting balloon PTCA of 80% in-stent restenosis, 7/04. preserved L ventriculr function  . Carotid bruit    bilateral. no sigificant distal abd atherosclerosis by recent cath.   . Chronic bronchitis ([place])   . Constipation   . COPD (chronic obstructive pulmonary disease) (HCC)    ongoing tobacco   . CVA (cerebral infarction)    left sided weakness  . DM2 (diabetes mellitus, type 2) ([place])   . Dyslipidemia   . Dysphagia   . Glaucoma   . HTN (hypertension)      Family History  Problem Relation Age of Onset  . Hypertension Mother   . Stroke Maternal Grandmother   . Cancer Neg Hx   . Diabetes Neg Hx   . Heart disease Neg Hx   No known family history of lung disease  Social History   Socioeconomic History  . Marital status: Married    Spouse name:  Not on file  . Number of children: Not on file  . Years of education: Not on file  . Highest education level: Not on file  Occupational History  . Not on file  Social Needs  . Financial resource strain: Not on file  . Food insecurity:    Worry: Not on file    Inability: Not on file  . Transportation needs:    Medical: Not on file    Non-medical: Not on file  Tobacco Use  . Smoking status: Former Smoker    Last attempt to quit: 08/03/2014    Years since quitting: 2.9  . Smokeless tobacco: Never Used  . Tobacco comment: 50 pack year hx ; not smoking @ [facility]  Substance and Sexual Activity  . Alcohol use: No    Alcohol/week: 0.0 oz    Comment: has not had alcohol in 30-40 years   . Drug use: No  . Sexual activity: Not on file  Lifestyle  . Physical activity:    Days per week: Not on file    Minutes per session: Not on file  . Stress: Not on file  Relationships  . Social connections:    Talks on phone: Not on file    Gets together: Not on file    Attends religious service: Not on file  Active member of club or organization: Not on file    Attends meetings of clubs or organizations: Not on file    Relationship status: Not on file  Other Topics Concern  . Not on file  Social History Narrative  . Not on file     ROS: Except as mentioned 10 point review of systems is negative    Objective: Vital signs in last 24 hours: Temp:  [97.7 F (36.5 C)-98.6 F (37 C)] 97.7 F (36.5 C) (03/23 0600) Pulse Rate:  [65-94] 78 (03/23 0600) Resp:  [12-22] 17 (03/23 0600) BP: (98-149)/(48-64) 113/63 (03/23 0600) SpO2:  [90 %-99 %] 98 % (03/23 0600) Weight:  [83.9 kg (185 lb)-84 kg (185 lb 3 oz)] 84 kg (185 lb 3 oz) (03/22 2100) Weight change:  Last BM Date: 07/22/17  Intake/Output from previous day: 03/22 0701 - 03/23 0700 In: 571.7 [P.O.:360; I.V.:211.7] Out: 450 [Urine:450]  PHYSICAL EXAM Constitutional: He is sleepy but arousable.  Eyes: Pupils react EOMI. ears nose mouth  and throat: His mucous membranes are moist.  Hearing is grossly normal.  Cardiovascular: His heart is regular with normal heart sounds.  Respiratory: He has diminished breath sounds on the right.  Gastrointestinal: His abdomen is soft with no masses.  Musculoskeletal: Strength is grossly normal in the upper and lower extremities bilaterally.  Neurological: He has some dysarthria.  Skin: Warm and dry.  Psychiatric: Sleepy but with normal mood and affect  Lab Results: Basic Metabolic Panel: Recent Labs    07/22/17 1506 07/23/17 0617  NA 139 142  K 4.3 4.4  CL 108 111  CO2 17* 18*  GLUCOSE 196* 108*  BUN 58* 57*  CREATININE 2.97* 2.05*  CALCIUM 8.8* 8.7*   Liver Function Tests: Recent Labs    07/22/17 1240 07/22/17 1506  AST 17 15  ALT 11* 12*  ALKPHOS 58 54  BILITOT 0.6 0.6  PROT 7.4 7.6  ALBUMIN 3.2* 3.2*   No results for input(s): LIPASE, AMYLASE in the last 72 hours. No results for input(s): AMMONIA in the last 72 hours. CBC: Recent Labs    07/22/17 1506 07/23/17 0617  WBC 18.5* 16.9*  NEUTROABS 14.5* 12.3*  HGB 11.1* 12.2*  HCT 35.0* 37.5*  MCV 83.1 83.1  PLT 422* 421*   Cardiac Enzymes: Recent Labs    07/22/17 1507 07/22/17 2243 07/23/17 0617  TROPONINI 0.03* 0.03* 0.03*   BNP: No results for input(s): PROBNP in the last 72 hours. D-Dimer: No results for input(s): DDIMER in the last 72 hours. CBG: Recent Labs    07/22/17 1820 07/22/17 2138 07/23/17 0000 07/23/17 0451 07/23/17 0747  GLUCAP 136* 99 107* 104* 106*   Hemoglobin A1C: No results for input(s): HGBA1C in the last 72 hours. Fasting Lipid Panel: No results for input(s): CHOL, HDL, LDLCALC, TRIG, CHOLHDL, LDLDIRECT in the last 72 hours. Thyroid Function Tests: Recent Labs    07/22/17 1240  TSH 1.483   Anemia Panel: No results for input(s): VITAMINB12, FOLATE, FERRITIN, TIBC, IRON, RETICCTPCT in the last 72 hours. Coagulation: No results for input(s): LABPROT, INR in the last 72  hours. Urine Drug Screen: Drugs of Abuse     Component Value Date/Time   LABOPIA NONE DETECTED 07/29/2012 2106   COCAINSCRNUR NONE DETECTED 07/29/2012 2106   LABBENZ POSITIVE (A) 07/29/2012 2106   AMPHETMU NONE DETECTED 07/29/2012 2106   THCU NONE DETECTED 07/29/2012 2106   LABBARB NONE DETECTED 07/29/2012 2106    Alcohol Level: No results for input(s): ETH  in the last 72 hours. Urinalysis: Recent Labs    07/23/17 0017  COLORURINE AMBER*  LABSPEC 1.020  PHURINE 5.0  GLUCOSEU NEGATIVE  HGBUR NEGATIVE  BILIRUBINUR NEGATIVE  KETONESUR 5*  PROTEINUR NEGATIVE  NITRITE NEGATIVE  LEUKOCYTESUR NEGATIVE   Misc. Labs:   ABGS: No results for input(s): PHART, PO2ART, TCO2, HCO3 in the last 72 hours.  Invalid input(s): PCO2   MICROBIOLOGY: Recent Results (from the past 240 hour(s))  Gram stain     Status: None   Collection Time: 07/22/17  4:20 PM  Result Value Ref Range Status   Specimen Description THORACENTESIS  Final   Special Requests NONE  Final   Gram Stain   Final    CYTOSPIN SMEAR NO ORGANISMS SEEN WBC PRESENT,BOTH PMN AND MONONUCLEAR Performed at [facility], [address], [place], [place] [id number]    Report Status 07/22/2017 FINAL  Final  Culture, body fluid-bottle     Status: None (Preliminary result)   Collection Time: 07/22/17  4:20 PM  Result Value Ref Range Status   Specimen Description PLEURAL  Final   Special Requests [facility]  Final   Culture   Final    NO GROWTH < 24 HOURS Performed at [facility], [address], [place], [place] [address]    Report Status PENDING  Incomplete  MRSA PCR Screening     Status: None   Collection Time: 07/22/17  9:22 PM  Result Value Ref Range Status   MRSA by PCR NEGATIVE NEGATIVE Final    Comment:        The GeneXpert MRSA Assay (FDA approved for NASAL specimens only), is one component of a comprehensive MRSA colonization surveillance program. It is not intended to diagnose MRSA infection nor to guide  or monitor treatment for MRSA infections. Performed at [facility], [address] [place], [place] [address]     Studies/Results: Dg Chest 1 View  Result Date: 07/22/2017 CLINICAL DATA:  RIGHT pleural effusion post thoracentesis with removal of 1.05 L of fluid from the RIGHT chest EXAM: CHEST  1 VIEW COMPARISON:  Earlier study of 07/22/2017 FINDINGS: Enlargement of cardiac silhouette post CABG. Atherosclerotic calcification aorta. Persistent large RIGHT pleural effusion with significant atelectasis of the mid to lower RIGHT lung. Minimal LEFT basilar atelectasis. No pneumothorax. Osseous structures unremarkable. IMPRESSION: Persistent large RIGHT pleural effusion and significant atelectasis of the mid to lower RIGHT lung despite thoracentesis. No pneumothorax. Electronically Signed   By: Lavonia Dana M.D.   On: 07/22/2017 16:55   Dg Chest 2 View  Result Date: 07/22/2017 CLINICAL DATA:  Acutely elevated BUN and white blood cell count. Headache today. EXAM: CHEST - 2 VIEW COMPARISON:  Single-view of the chest 04/05/2015. FINDINGS: The patient has a large right pleural effusion with extensive airspace disease. The left lung is clear. There is cardiomegaly. The patient is status post CABG. Atherosclerosis is noted. IMPRESSION: Large right pleural effusion and associated airspace disease which cannot be definitively characterized. Cardiomegaly. Electronically Signed   By: Inge Rise M.D.   On: 07/22/2017 15:26   Ct Chest Wo Contrast  Result Date: 07/22/2017 CLINICAL DATA:  RIGHT pleural effusion EXAM: CT CHEST WITHOUT CONTRAST TECHNIQUE: Multidetector CT imaging of the chest was performed following the standard protocol without IV contrast. Sagittal and coronal MPR images reconstructed from axial data set. IV contrast not utilized due to renal dysfunction. COMPARISON:  Chest radiograph 07/22/2017 FINDINGS: Cardiovascular: Atherosclerotic calcifications aorta, proximal great vessels and coronary  arteries. Aorta normal caliber.  No pericardial effusion. Mediastinum/Nodes: Esophagus unremarkable. Base of cervical region normal appearance. Enlarged precarinal lymph node 15 mm short axis image 55. Additional scattered normal sized mediastinal nodes. Hilar assessment limited by lack of IV contrast but RIGHT hilum appears slightly enlarged adenopathy questioned. Lungs/Pleura: Large RIGHT pleural effusion. Complete atelectasis of RIGHT lower lobe. Complete atelectasis of RIGHT middle lobe. Partial atelectasis of RIGHT upper lobe. Abnormal soft tissue density at the anteromedial mid RIGHT chest highly suspicious for pleural based tumor. This measures up to 5.1 x 3.5 cm image 67 extends approximately 6.1 cm craniocaudal length. This abuts both the anterior chest wall and the mediastinum. Questionable area of minimal pleural thickening at the inferomedial RIGHT hemithorax. 9 mm LEFT mid lung nodule at major fissure image 73. Question additional midlung nodule 6 mm diameter image 70. Tiny calcified granuloma LEFT upper lobe. Question additional 5 mm LEFT upper lobe nodule. Tiny RIGHT upper lobe pulmonary nodules sagittal images 48 and 54. Upper Abdomen: Unremarkable prior median sternotomy. Musculoskeletal: No acute osseous findings. IMPRESSION: Large RIGHT pleural effusion with complete atelectasis of the RIGHT middle and RIGHT lower lobes and partial atelectasis of the RIGHT upper lobe. 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 BILATERAL lung nodules. a Extensive atherosclerotic calcifications including coronary arteries. Aortic Atherosclerosis (ICD10-I70.0). Electronically Signed   By: Lavonia Dana M.D.   On: 07/22/2017 19:09   US Abdomen Complete  Result Date: 07/22/2017 CLINICAL DATA:  Elevated BUN and creatinine, does not feel good, RIGHT pleural effusion by chest radiograph EXAM: ABDOMEN ULTRASOUND COMPLETE COMPARISON:  CT abdomen and pelvis  02/02/2015 FINDINGS: Gallbladder: Shadowing gallstone 13 mm diameter within gallbladder. No gallbladder wall thickening, pericholecystic fluid or sonographic Murphy sign. Common bile duct: Diameter: Normal caliber 3 mm diameter Liver: Upper normal hepatic echogenicity. No discrete hepatic mass lesion. Portal vein is patent on color Doppler imaging with normal direction of blood flow towards the liver. IVC: Normal appearance Pancreas: Visualized portion of pancreatic body and proximal tail normal appearance, remainder obscured by bowel gas Spleen: Inadequately visualized due to high subcostal position Right Kidney: Length: 10.9 cm. Normal morphology without mass or hydronephrosis. Left Kidney: Length: 10.6 cm. Normal morphology without mass or hydronephrosis. Abdominal aorta: Normal caliber at proximal and mid portions, bifurcation obscured by bowel gas Other findings: No free fluid IMPRESSION: Cholelithiasis without evidence acute cholecystitis. Incomplete visualization of pancreas, spleen, and distal aorta. Electronically Signed   By: Lavonia Dana M.D.   On: 07/22/2017 16:39   US Thoracentesis Asp Pleural Space W/img Guide  Result Date: 07/22/2017 INDICATION: Large RIGHT pleural effusion EXAM: ULTRASOUND GUIDED DIAGNOSTIC AND THERAPEUTIC RIGHT THORACENTESIS MEDICATIONS: None COMPLICATIONS: None immediate. PROCEDURE: Procedure, benefits, and risks of procedure were discussed with patient. Written informed consent for procedure was obtained. Time out protocol followed. Pleural effusion localized by ultrasound at the posterior RIGHT hemithorax. Skin prepped and draped in usual sterile fashion. Skin and soft tissues anesthetized with 10 mL of 1% lidocaine. 8 French thoracentesis catheter placed into the RIGHT pleural space. 1.05 L of dark old bloody fluid aspirated by syringe and vacuum bottle suction. Procedure tolerated well by patient without immediate complication. FINDINGS: A total of approximately 1.05 L of  RIGHT pleural fluid was removed. Samples were sent to the laboratory as requested by the clinical team. IMPRESSION: Successful ultrasound guided RIGHT thoracentesis yielding 1.05 L of pleural fluid. Electronically Signed   By: Lavonia Dana M.D.   On: 07/22/2017  16:46    Medications:  Prior to Admission:  Medications Prior to Admission  Medication Sig Dispense Refill Last Dose  . amLODipine (NORVASC) 2.5 MG tablet Take 5 mg by mouth daily.    Taking  . [EXPIRED] amoxicillin-clavulanate (AUGMENTIN) 500-125 MG tablet Take 1 tablet by mouth 2 (two) times daily.   Taking  . Calcium Carbonate-Vitamin D (OSCAL 500/200 D-3 PO) 1 Tablet once a day by mouth . Can't be given with iron   Taking  . clopidogrel (PLAVIX) 75 MG tablet Take 75 mg by mouth daily.   Taking  . docusate sodium (COLACE) 100 MG capsule Take 100 mg by mouth 2 (two) times daily.   Taking  . ferrous sulfate (QC FERROUS SULFATE) 325 (65 FE) MG tablet Take 325 mg by mouth daily.    Taking  . labetalol (NORMODYNE) 200 MG tablet Take 200 mg by mouth 3 (three) times daily. Reported on 10/22/2015   Taking  . linaclotide (LINZESS) 145 MCG CAPS capsule Take 145 mcg by mouth daily before breakfast.   Taking  . lisinopril (PRINIVIL,ZESTRIL) 40 MG tablet Take 40 mg by mouth daily.   Taking  . metFORMIN (GLUCOPHAGE) 500 MG tablet Take 500 mg by mouth 2 (two) times daily with a meal.    Taking  . Multiple Vitamin (MULTIVITAMIN) tablet Take 1 tablet by mouth daily.   Taking  . Omega-3 Fatty Acids (FISH OIL) 1000 MG CAPS Take 1,000 mg by mouth 3 (three) times daily.   Taking  . pantoprazole (PROTONIX) 40 MG tablet Take 40 mg by mouth daily.   Taking  . polyethylene glycol (MIRALAX / GLYCOLAX) packet Take 17 g by mouth daily as needed.   Taking  . pravastatin (PRAVACHOL) 80 MG tablet Take 80 mg by mouth daily.   Taking  . Probiotic Product (RISA-BID PROBIOTIC) TABS Take 1 tablet by mouth twice a day from //-//   Taking  . senna (SENOKOT) 8.6 MG TABS  tablet Take 2 tablets (17.2 mg total) by mouth daily. 120 each 0 Taking  . tamsulosin (FLOMAX) 0.4 MG CAPS capsule Take 0.4 mg by mouth daily after supper.   Taking   Scheduled: . acidophilus  1 capsule Oral BID  . amoxicillin-clavulanate  1 tablet Oral BID  . clopidogrel  75 mg Oral Daily  . docusate sodium  100 mg Oral BID  . heparin  5,000 Units Subcutaneous Q8H  . insulin aspart  0-9 Units Subcutaneous Q4H  . labetalol  200 mg Oral Q8H  . linaclotide  145 mcg Oral QAC breakfast  . mouth rinse  15 mL Mouth Rinse BID  . multivitamin with minerals  1 tablet Oral Daily  . omega-3 acid ethyl esters  1 g Oral BID  . pantoprazole  40 mg Oral Daily  . pravastatin  80 mg Oral q1800  . senna  2 tablet Oral Daily  . tamsulosin  0.4 mg Oral QPC supper   Continuous:  KDT:[id number] **OR** acetaminophen, HYDROcodone-acetaminophen, ondansetron **OR** ondansetron (ZOFRAN) IV, polyethylene glycol  Assesment: He has a large right pleural effusion.  He has what looks like a mass lesion on the right side of his chest on his CT.  He has COPD at baseline which is being treated  He has hypertension.  This is well controlled  He has a previous history of a stroke Principal Problem:   Pleural effusion on right Active Problems:   COPD (chronic obstructive pulmonary disease) (HCC)   Hypertension   Coronary atherosclerosis  AKI (acute kidney injury) ([place])   Elevated troponin   DM (diabetes mellitus) with peripheral vascular complication (HCC)   Iron deficiency anemia   Mass in chest   Lung nodules   Recurrent right pleural effusion   UTI (urinary tract infection)    Plan: Await cytology from thoracentesis.  If this is positive then I think Pleurx catheter would be appropriate.  If it is negative he still may need Pleurx catheter but he would also need biopsy of the pleural-based mass.    LOS: 1 day   Geraldine Tesar L 07/23/2017, 10:21 AM

## 2017-07-24 LAB — GLUCOSE, CAPILLARY
GLUCOSE-CAPILLARY: 135 mg/dL — AB (ref 65–99)
Glucose-Capillary: 115 mg/dL — ABNORMAL HIGH (ref 65–99)
Glucose-Capillary: 120 mg/dL — ABNORMAL HIGH (ref 65–99)
Glucose-Capillary: 124 mg/dL — ABNORMAL HIGH (ref 65–99)
Glucose-Capillary: 168 mg/dL — ABNORMAL HIGH (ref 65–99)
Glucose-Capillary: 114 mg/dL — ABNORMAL HIGH (ref 65–99)

## 2017-07-24 LAB — CBC
HCT: 35 % — ABNORMAL LOW (ref 39.0–52.0)
Hemoglobin: 11.4 g/dL — ABNORMAL LOW (ref 13.0–17.0)
MCH: 26.8 pg (ref 26.0–34.0)
MCHC: 32.6 g/dL (ref 30.0–36.0)
MCV: 82.4 fL (ref 78.0–100.0)
PLATELETS: 405 10*3/uL — AB (ref 150–400)
RBC: 4.25 MIL/uL (ref 4.22–5.81)
RDW: 13.9 % (ref 11.5–15.5)
WBC: 17 10*3/uL — ABNORMAL HIGH (ref 4.0–10.5)

## 2017-07-24 LAB — BASIC METABOLIC PANEL
Anion gap: 10 (ref 5–15)
BUN: 39 mg/dL — AB (ref 6–20)
CALCIUM: 8.5 mg/dL — AB (ref 8.9–10.3)
CO2: 18 mmol/L — AB (ref 22–32)
CREATININE: 1.12 mg/dL (ref 0.61–1.24)
Chloride: 112 mmol/L — ABNORMAL HIGH (ref 101–111)
GFR calc non Af Amer: >60 mL/min (ref >60)
Glucose, Bld: 157 mg/dL — ABNORMAL HIGH (ref 65–99)
Potassium: 4.1 mmol/L (ref 3.5–5.1)
SODIUM: 140 mmol/L (ref 135–145)

## 2017-07-24 LAB — UREA NITROGEN, URINE: Urea Nitrogen, Ur: 766 mg/dL

## 2017-07-24 LAB — URINE CULTURE: CULTURE: NO GROWTH

## 2017-07-24 NOTE — Progress Notes (Signed)
Subjective: He says he feels okay.  No complaints.  He says his breathing is pretty good.  Objective: Vital signs in last 24 hours: Temp:  [98.2 F (36.8 C)-99.1 F (37.3 C)] 98.4 F (36.9 C) (03/24 0500) Pulse Rate:  [74-88] 79 (03/24 0500) Resp:  [18] 18 (03/24 0500) BP: (133-145)/(43-66) 145/43 (03/24 0500) SpO2:  [93 %-95 %] 95 % (03/24 0500) Weight change:  Last BM Date: 07/23/17  Intake/Output from previous day: 03/23 0701 - 03/24 0700 In: 460 [P.O.:460] Out: 650 [Urine:650]  PHYSICAL EXAM General appearance: alert, cooperative and no distress Resp: Markedly decreased right breath sounds Cardio: regular rate and rhythm, S1, S2 normal, no murmur, click, rub or gallop GI: soft, non-tender; bowel sounds normal; no masses,  no organomegaly Extremities: extremities normal, atraumatic, no cyanosis or edema He is blind  Lab Results:  Results for orders placed or performed during the hospital encounter of 07/22/17 (from the past 48 hour(s))  CBC with Differential     Status: Abnormal   Collection Time: 07/22/17  3:06 PM  Result Value Ref Range   WBC 18.5 (H) 4.0 - 10.5 K/uL   RBC 4.21 (L) 4.22 - 5.81 MIL/uL   Hemoglobin 11.1 (L) 13.0 - 17.0 g/dL   HCT 35.0 (L) 39.0 - 52.0 %   MCV 83.1 78.0 - 100.0 fL   MCH 26.4 26.0 - 34.0 pg   MCHC 31.7 30.0 - 36.0 g/dL   RDW 13.8 11.5 - 15.5 %   Platelets 422 (H) 150 - 400 K/uL   Neutrophils Relative % 78 %   Neutro Abs 14.5 (H) 1.7 - 7.7 K/uL   Lymphocytes Relative 10 %   Lymphs Abs 1.9 0.7 - 4.0 K/uL   Monocytes Relative 7 %   Monocytes Absolute 1.3 (H) 0.1 - 1.0 K/uL   Eosinophils Relative 4 %   Eosinophils Absolute 0.7 0.0 - 0.7 K/uL   Basophils Relative 1 %   Basophils Absolute 0.1 0.0 - 0.1 K/uL    Comment: Performed at [facility], [address] [place], [place] [address]  Comprehensive metabolic panel     Status: Abnormal   Collection Time: 07/22/17  3:06 PM  Result Value Ref Range   Sodium 139 135 - 145 mmol/L    Potassium 4.3 3.5 - 5.1 mmol/L   Chloride 108 101 - 111 mmol/L   CO2 17 (L) 22 - 32 mmol/L   Glucose, Bld 196 (H) 65 - 99 mg/dL   BUN 58 (H) 6 - 20 mg/dL   Creatinine, Ser 2.97 (H) 0.61 - 1.24 mg/dL   Calcium 8.8 (L) 8.9 - 10.3 mg/dL   Total Protein 7.6 6.5 - 8.1 g/dL   Albumin 3.2 (L) 3.5 - 5.0 g/dL   AST 15 15 - 41 U/L   ALT 12 (L) 17 - 63 U/L   Alkaline Phosphatase 54 38 - 126 U/L   Total Bilirubin 0.6 0.3 - 1.2 mg/dL   GFR calc non Af Amer 18 (L) >60 mL/min   GFR calc Af Amer 21 (L) >60 mL/min    Comment: (NOTE) The eGFR has been calculated using the CKD EPI equation. This calculation has not been validated in all clinical situations. eGFR's persistently <60 mL/min signify possible Chronic Kidney Disease.    Anion gap 14 5 - 15    Comment: Performed at [facility], [address], [place], [place] [address]  Troponin I     Status: Abnormal   Collection Time: 07/22/17  3:07 PM  Result Value Ref Range   Troponin I 0.03 (HH) <0.03 ng/mL    Comment: CRITICAL RESULT CALLED TO, READ BACK BY AND VERIFIED WITH: BETHEL,S ON 07/22/17 AT 1650 BY LOY,C Performed at [facility], [address] [place], [place] [address]   Brain natriuretic peptide     Status: None   Collection Time: 07/22/17  3:07 PM  Result Value Ref Range   B Natriuretic Peptide 79.0 0.0 - 100.0 pg/mL    Comment: Performed at [facility], [address], [place], [place] [address]  Gram stain     Status: None   Collection Time: 07/22/17  4:20 PM  Result Value Ref Range   Specimen Description THORACENTESIS    Special Requests NONE    Gram Stain      CYTOSPIN SMEAR NO ORGANISMS SEEN WBC PRESENT,BOTH PMN AND MONONUCLEAR Performed at [facility], [address], [place], [place] [address]    Report Status 07/22/2017 FINAL   Glucose, Body Fluid Other     Status: None   Collection Time: 07/22/17  4:20 PM  Result Value Ref Range   Glucose, Body Fluid Other 118 mg/dL    Comment:  (NOTE) ________________________________________________________ :  Peritoneal  :       Pleural          :   Synovial     : :______________:________________________:________________: :              : Transudate :  Exudate  :                : :______________:____________:___________:________________: :  Not Estab.  :  Equal to simultaneously drawn plasma   : :______________:_________________________________________: The method performance specifications have not been established for this test in body fluid. The test result should be integrated into clinical context for interpretation. The reference intervals and other method performance specifications have not been established for this test. The test result should be integrated into the clinical context for interpretation. Performed At: [facility] [place], [place] [id number] Rush Farmer MD [id number]    Source of Sample PLEURAL     Comment: Performed at [facility], [address], [place], [place] [address]  Acid Fast Smear (AFB)     Status: None   Collection Time: 07/22/17  4:20 PM  Result Value Ref Range   AFB Specimen Processing Concentration    Acid Fast Smear Negative     Comment: (NOTE) Performed At: [facility] [address] [place], [place] [id number] Rush Farmer MD [id number]    Source (AFB) PLEURAL     Comment: Performed at [facility], [address] [place], [place] [address]  Protein, pleural or peritoneal fluid     Status: None   Collection Time: 07/22/17  4:20 PM  Result Value Ref Range   Total protein, fluid 5.4 g/dL    Comment: (NOTE) No normal range established for this test Results should be evaluated in conjunction with serum values    Fluid Type-FTP PLEURAL     Comment: Performed at [facility], [address], [place], [place] [address] CORRECTED ON 03/22 AT 1746: PREVIOUSLY REPORTED AS Pleural R   Culture, body fluid-bottle     Status: None (Preliminary  result)   Collection Time: 07/22/17  4:20 PM  Result Value Ref Range   Specimen Description PLEURAL    Special Requests 10CC EACH    Culture      NO GROWTH < 24 HOURS Performed at [facility], [address], [place], [place] [address]    Report Status PENDING   Body fluid cell count with differential     Status: Abnormal   Collection Time: 07/22/17  4:20 PM  Result Value Ref Range   Fluid Type-FCT PLEURAL    Color, Fluid RED (A) YELLOW   Appearance, Fluid CLOUDY (A) CLEAR   WBC, Fluid 925 0 - 1,000 cu mm   Neutrophil Count, Fluid 42 (H) 0 - 25 %   Lymphs, Fluid 41 %   Monocyte-Macrophage-Serous Fluid 15 (L) 50 - 90 %   Eos, Fluid 2 %   Other Cells, Fluid 0 %    Comment: Performed at [facility], [address] [place], [place] [address]  CBG monitoring, ED     Status: Abnormal   Collection Time: 07/22/17  6:20 PM  Result Value Ref Range   Glucose-Capillary 136 (H) 65 - 99 mg/dL  MRSA PCR Screening     Status: None   Collection Time: 07/22/17  9:22 PM  Result Value Ref Range   MRSA by PCR NEGATIVE NEGATIVE    Comment:        The GeneXpert MRSA Assay (FDA approved for NASAL specimens only), is one component of a comprehensive MRSA colonization surveillance program. It is not intended to diagnose MRSA infection nor to guide or monitor treatment for MRSA infections. Performed at [facility], [address], [place], [place] [address]   Glucose, capillary     Status: None   Collection Time: 07/22/17  9:38 PM  Result Value Ref Range   Glucose-Capillary 99 65 - 99 mg/dL   Comment 1 Notify RN    Comment 2 Document in Chart   Troponin I (q 6hr x 3)     Status: Abnormal   Collection Time: 07/22/17 10:43 PM  Result Value Ref Range   Troponin I 0.03 (HH) <0.03 ng/mL    Comment: CRITICAL VALUE NOTED.  VALUE IS CONSISTENT WITH PREVIOUSLY REPORTED AND CALLED VALUE. Performed at [facility], [address], [place], [place] [address]   Glucose, capillary     Status: Abnormal    Collection Time: 07/23/17 12:00 AM  Result Value Ref Range   Glucose-Capillary 107 (H) 65 - 99 mg/dL   Comment 1 Notify RN    Comment 2 Document in Chart   Urinalysis, Routine w reflex microscopic     Status: Abnormal   Collection Time: 07/23/17 12:17 AM  Result Value Ref Range   Color, Urine AMBER (A) YELLOW    Comment: BIOCHEMICALS MAY BE AFFECTED BY COLOR   APPearance CLOUDY (A) CLEAR   Specific Gravity, Urine 1.020 1.005 - 1.030   pH 5.0 5.0 - 8.0   Glucose, UA NEGATIVE NEGATIVE mg/dL   Hgb urine dipstick NEGATIVE NEGATIVE   Bilirubin Urine NEGATIVE NEGATIVE   Ketones, ur 5 (A) NEGATIVE mg/dL   Protein, ur NEGATIVE NEGATIVE mg/dL   Nitrite NEGATIVE NEGATIVE   Leukocytes, UA NEGATIVE NEGATIVE    Comment: Performed at [facility], [address], [place], [place] [address]  Sodium, urine, random     Status: None   Collection Time: 07/23/17 12:17 AM  Result Value Ref Range   Sodium, Ur 17 mmol/L    Comment: Performed at [facility], [address], [place],  [address]  Urea nitrogen, urine     Status: None   Collection Time: 07/23/17 12:17 AM  Result Value Ref Range   Urea Nitrogen, Ur 766 Not Estab. mg/dL  Comment: (NOTE) Performed At: [facility] [place], [place] [id number] Rush Farmer MD [id number] Performed at [facility], [address], [place], [place] [address]   Creatinine, urine, random     Status: None   Collection Time: 07/23/17 12:17 AM  Result Value Ref Range   Creatinine, Urine 369.66 mg/dL    Comment: Performed at [facility], [address] [place], [place] [address]  Glucose, capillary     Status: Abnormal   Collection Time: 07/23/17  4:51 AM  Result Value Ref Range   Glucose-Capillary 104 (H) 65 - 99 mg/dL   Comment 1 Notify RN    Comment 2 Document in Chart   Troponin I (q 6hr x 3)     Status: Abnormal   Collection Time: 07/23/17  6:17 AM  Result Value Ref Range   Troponin I 0.03 (HH) <0.03 ng/mL     Comment: CRITICAL VALUE NOTED.  VALUE IS CONSISTENT WITH PREVIOUSLY REPORTED AND CALLED VALUE. Performed at [facility], [address], [place], [place] [address]   Basic metabolic panel     Status: Abnormal   Collection Time: 07/23/17  6:17 AM  Result Value Ref Range   Sodium 142 135 - 145 mmol/L   Potassium 4.4 3.5 - 5.1 mmol/L   Chloride 111 101 - 111 mmol/L   CO2 18 (L) 22 - 32 mmol/L   Glucose, Bld 108 (H) 65 - 99 mg/dL   BUN 57 (H) 6 - 20 mg/dL   Creatinine, Ser 2.05 (H) 0.61 - 1.24 mg/dL   Calcium 8.7 (L) 8.9 - 10.3 mg/dL   GFR calc non Af Amer 29 (L) >60 mL/min   GFR calc Af Amer 34 (L) >60 mL/min    Comment: (NOTE) The eGFR has been calculated using the CKD EPI equation. This calculation has not been validated in all clinical situations. eGFR's persistently <60 mL/min signify possible Chronic Kidney Disease.    Anion gap 13 5 - 15    Comment: Performed at [facility], [address], [place], [place] [address]  CBC WITH DIFFERENTIAL     Status: Abnormal   Collection Time: 07/23/17  6:17 AM  Result Value Ref Range   WBC 16.9 (H) 4.0 - 10.5 K/uL   RBC 4.51 4.22 - 5.81 MIL/uL   Hemoglobin 12.2 (L) 13.0 - 17.0 g/dL   HCT 37.5 (L) 39.0 - 52.0 %   MCV 83.1 78.0 - 100.0 fL   MCH 27.1 26.0 - 34.0 pg   MCHC 32.5 30.0 - 36.0 g/dL   RDW 14.1 11.5 - 15.5 %   Platelets 421 (H) 150 - 400 K/uL   Neutrophils Relative % 73 %   Neutro Abs 12.3 (H) 1.7 - 7.7 K/uL   Lymphocytes Relative 11 %   Lymphs Abs 1.9 0.7 - 4.0 K/uL   Monocytes Relative 10 %   Monocytes Absolute 1.8 (H) 0.1 - 1.0 K/uL   Eosinophils Relative 6 %   Eosinophils Absolute 1.0 (H) 0.0 - 0.7 K/uL   Basophils Relative 0 %   Basophils Absolute 0.1 0.0 - 0.1 K/uL    Comment: Performed at [facility], [address], [place], [place] [address]  Glucose, capillary     Status: Abnormal   Collection Time: 07/23/17  7:47 AM  Result Value Ref Range   Glucose-Capillary 106 (H) 65 - 99 mg/dL  Troponin I (q 6hr x 3)      Status: None   Collection Time: 07/23/17 10:54 AM  Result Value Ref Range   Troponin I <0.03 <0.03 ng/mL    Comment: Performed at [facility], [address], [place], [place] [address]  Glucose, capillary     Status: Abnormal   Collection Time: 07/23/17 11:16 AM  Result Value Ref Range   Glucose-Capillary 137 (H) 65 - 99 mg/dL  Glucose, capillary     Status: Abnormal   Collection Time: 07/23/17  5:12 PM  Result Value Ref Range   Glucose-Capillary 175 (H) 65 - 99 mg/dL   Comment 1 Document in Chart   Glucose, capillary     Status: Abnormal   Collection Time: 07/23/17  9:13 PM  Result Value Ref Range   Glucose-Capillary 160 (H) 65 - 99 mg/dL   Comment 1 Notify RN    Comment 2 Document in Chart   Glucose, capillary     Status: None   Collection Time: 07/23/17 11:19 PM  Result Value Ref Range   Glucose-Capillary 93 65 - 99 mg/dL   Comment 1 Notify RN    Comment 2 Document in Chart   Glucose, capillary     Status: Abnormal   Collection Time: 07/24/17  3:38 AM  Result Value Ref Range   Glucose-Capillary 115 (H) 65 - 99 mg/dL   Comment 1 Notify RN    Comment 2 Document in Chart     ABGS No results for input(s): PHART, PO2ART, TCO2, HCO3 in the last 72 hours.  Invalid input(s): PCO2 CULTURES Recent Results (from the past 240 hour(s))  Gram stain     Status: None   Collection Time: 07/22/17  4:20 PM  Result Value Ref Range Status   Specimen Description THORACENTESIS  Final   Special Requests NONE  Final   Gram Stain   Final    CYTOSPIN SMEAR NO ORGANISMS SEEN WBC PRESENT,BOTH PMN AND MONONUCLEAR Performed at [facility], [address], [place], [place] [address]    Report Status 07/22/2017 FINAL  Final  Acid Fast Smear (AFB)     Status: None   Collection Time: 07/22/17  4:20 PM  Result Value Ref Range Status   AFB Specimen Processing Concentration  Final   Acid Fast Smear Negative  Final    Comment: (NOTE) Performed At: [facility] [address]  [place], [place] [id number] Rush Farmer MD [id number]    Source (AFB) PLEURAL  Final    Comment: Performed at [facility], [address], [place], [place] [address]  Culture, body fluid-bottle     Status: None (Preliminary result)   Collection Time: 07/22/17  4:20 PM  Result Value Ref Range Status   Specimen Description PLEURAL  Final   Special Requests [facility]  Final   Culture   Final    NO GROWTH < 24 HOURS Performed at [facility], [address], [place], [place] [address]    Report Status PENDING  Incomplete  MRSA PCR Screening     Status: None   Collection Time: 07/22/17  9:22 PM  Result Value Ref Range Status   MRSA by PCR NEGATIVE NEGATIVE Final    Comment:        The GeneXpert MRSA Assay (FDA approved for NASAL specimens only), is one component of a comprehensive MRSA colonization surveillance program. It is not intended to diagnose MRSA infection nor to guide or monitor treatment for MRSA infections. Performed at [facility], [address] [place], [place] [address]    Studies/Results: Dg Chest 1 View  Result Date: 07/22/2017 CLINICAL  DATA:  RIGHT pleural effusion post thoracentesis with removal of 1.05 L of fluid from the RIGHT chest EXAM: CHEST  1 VIEW COMPARISON:  Earlier study of 07/22/2017 FINDINGS: Enlargement of cardiac silhouette post CABG. Atherosclerotic calcification aorta. Persistent large RIGHT pleural effusion with significant atelectasis of the mid to lower RIGHT lung. Minimal LEFT basilar atelectasis. No pneumothorax. Osseous structures unremarkable. IMPRESSION: Persistent large RIGHT pleural effusion and significant atelectasis of the mid to lower RIGHT lung despite thoracentesis. No pneumothorax. Electronically Signed   By: Lavonia Dana M.D.   On: 07/22/2017 16:55   Dg Chest 2 View  Result Date: 07/22/2017 CLINICAL DATA:  Acutely elevated BUN and white blood cell count. Headache today. EXAM: CHEST - 2 VIEW COMPARISON:  Single-view of the  chest 04/05/2015. FINDINGS: The patient has a large right pleural effusion with extensive airspace disease. The left lung is clear. There is cardiomegaly. The patient is status post CABG. Atherosclerosis is noted. IMPRESSION: Large right pleural effusion and associated airspace disease which cannot be definitively characterized. Cardiomegaly. Electronically Signed   By: Inge Rise M.D.   On: 07/22/2017 15:26   Ct Chest Wo Contrast  Result Date: 07/22/2017 CLINICAL DATA:  RIGHT pleural effusion EXAM: CT CHEST WITHOUT CONTRAST TECHNIQUE: Multidetector CT imaging of the chest was performed following the standard protocol without IV contrast. Sagittal and coronal MPR images reconstructed from axial data set. IV contrast not utilized due to renal dysfunction. COMPARISON:  Chest radiograph 07/22/2017 FINDINGS: Cardiovascular: Atherosclerotic calcifications aorta, proximal great vessels and coronary arteries. Aorta normal caliber. No pericardial effusion. Mediastinum/Nodes: Esophagus unremarkable. Base of cervical region normal appearance. Enlarged precarinal lymph node 15 mm short axis image 55. Additional scattered normal sized mediastinal nodes. Hilar assessment limited by lack of IV contrast but RIGHT hilum appears slightly enlarged adenopathy questioned. Lungs/Pleura: Large RIGHT pleural effusion. Complete atelectasis of RIGHT lower lobe. Complete atelectasis of RIGHT middle lobe. Partial atelectasis of RIGHT upper lobe. Abnormal soft tissue density at the anteromedial mid RIGHT chest highly suspicious for pleural based tumor. This measures up to 5.1 x 3.5 cm image 67 extends approximately 6.1 cm craniocaudal length. This abuts both the anterior chest wall and the mediastinum. Questionable area of minimal pleural thickening at the inferomedial RIGHT hemithorax. 9 mm LEFT mid lung nodule at major fissure image 73. Question additional midlung nodule 6 mm diameter image 70. Tiny calcified granuloma LEFT upper  lobe. Question additional 5 mm LEFT upper lobe nodule. Tiny RIGHT upper lobe pulmonary nodules sagittal images 48 and 54. Upper Abdomen: Unremarkable prior median sternotomy. Musculoskeletal: No acute osseous findings. IMPRESSION: Large RIGHT pleural effusion with complete atelectasis of the RIGHT middle and RIGHT lower lobes and partial atelectasis of the RIGHT upper lobe. 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 BILATERAL lung nodules. a Extensive atherosclerotic calcifications including coronary arteries. Aortic Atherosclerosis (ICD10-I70.0). Electronically Signed   By: Lavonia Dana M.D.   On: 07/22/2017 19:09   US Abdomen Complete  Result Date: 07/22/2017 CLINICAL DATA:  Elevated BUN and creatinine, does not feel good, RIGHT pleural effusion by chest radiograph EXAM: ABDOMEN ULTRASOUND COMPLETE COMPARISON:  CT abdomen and pelvis 02/02/2015 FINDINGS: Gallbladder: Shadowing gallstone 13 mm diameter within gallbladder. No gallbladder wall thickening, pericholecystic fluid or sonographic Murphy sign. Common bile duct: Diameter: Normal caliber 3 mm diameter Liver: Upper normal hepatic echogenicity. No discrete hepatic mass lesion. Portal vein is patent on color Doppler imaging with normal direction of  blood flow towards the liver. IVC: Normal appearance Pancreas: Visualized portion of pancreatic body and proximal tail normal appearance, remainder obscured by bowel gas Spleen: Inadequately visualized due to high subcostal position Right Kidney: Length: 10.9 cm. Normal morphology without mass or hydronephrosis. Left Kidney: Length: 10.6 cm. Normal morphology without mass or hydronephrosis. Abdominal aorta: Normal caliber at proximal and mid portions, bifurcation obscured by bowel gas Other findings: No free fluid IMPRESSION: Cholelithiasis without evidence acute cholecystitis. Incomplete visualization of pancreas, spleen, and distal aorta. Electronically  Signed   By: Lavonia Dana M.D.   On: 07/22/2017 16:39   US Thoracentesis Asp Pleural Space W/img Guide  Result Date: 07/22/2017 INDICATION: Large RIGHT pleural effusion EXAM: ULTRASOUND GUIDED DIAGNOSTIC AND THERAPEUTIC RIGHT THORACENTESIS MEDICATIONS: None COMPLICATIONS: None immediate. PROCEDURE: Procedure, benefits, and risks of procedure were discussed with patient. Written informed consent for procedure was obtained. Time out protocol followed. Pleural effusion localized by ultrasound at the posterior RIGHT hemithorax. Skin prepped and draped in usual sterile fashion. Skin and soft tissues anesthetized with 10 mL of 1% lidocaine. 8 French thoracentesis catheter placed into the RIGHT pleural space. 1.05 L of dark old bloody fluid aspirated by syringe and vacuum bottle suction. Procedure tolerated well by patient without immediate complication. FINDINGS: A total of approximately 1.05 L of RIGHT pleural fluid was removed. Samples were sent to the laboratory as requested by the clinical team. IMPRESSION: Successful ultrasound guided RIGHT thoracentesis yielding 1.05 L of pleural fluid. Electronically Signed   By: Lavonia Dana M.D.   On: 07/22/2017 16:46    Medications:  Prior to Admission:  Medications Prior to Admission  Medication Sig Dispense Refill Last Dose  . amLODipine (NORVASC) 5 MG tablet Take 5 mg by mouth daily.   07/22/2017 at Unknown time  . amoxicillin-clavulanate (AUGMENTIN) 500-125 MG tablet Take 1 tablet by mouth 2 (two) times daily.   07/22/2017 at Unknown time  . Calcium Carbonate-Vitamin D (OSCAL 500/200 D-3 PO) 1 Tablet once a day by mouth . Can't be given with iron   07/22/2017 at Unknown time  . clopidogrel (PLAVIX) 75 MG tablet Take 75 mg by mouth daily.   07/22/2017 at 0742  . docusate sodium (COLACE) 100 MG capsule Take 100 mg by mouth 2 (two) times daily.   07/22/2017 at Unknown time  . ferrous sulfate (QC FERROUS SULFATE) 325 (65 FE) MG tablet Take 325 mg by mouth daily.     07/22/2017 at Unknown time  . labetalol (NORMODYNE) 200 MG tablet Take 200 mg by mouth 3 (three) times daily. Reported on 10/22/2015   07/22/2017 at Unknown time  . linaclotide (LINZESS) 145 MCG CAPS capsule Take 145 mcg by mouth daily before breakfast.   07/22/2017 at Unknown time  . lisinopril (PRINIVIL,ZESTRIL) 40 MG tablet Take 40 mg by mouth daily.   07/22/2017 at Unknown time  . metFORMIN (GLUCOPHAGE) 500 MG tablet Take 500 mg by mouth 2 (two) times daily with a meal.    07/22/2017 at Unknown time  . Multiple Vitamin (MULTIVITAMIN) tablet Take 1 tablet by mouth daily.   07/22/2017 at Unknown time  . Omega-3 Fatty Acids (FISH OIL) 1000 MG CAPS Take 1,000 mg by mouth 3 (three) times daily.   07/22/2017 at Unknown time  . pantoprazole (PROTONIX) 40 MG tablet Take 40 mg by mouth daily.   07/22/2017 at Unknown time  . pravastatin (PRAVACHOL) 80 MG tablet Take 80 mg by mouth daily.   Past Week at Unknown time  . Probiotic  Product (RISA-BID PROBIOTIC) TABS Take 1 tablet by mouth twice a day from //-//   07/22/2017 at Unknown time  . senna (SENOKOT) 8.6 MG TABS tablet Take 2 tablets (17.2 mg total) by mouth daily. 120 each 0 07/22/2017 at Unknown time  . tamsulosin (FLOMAX) 0.4 MG CAPS capsule Take 0.4 mg by mouth daily after supper.   Past Week at Unknown time   Scheduled: . acidophilus  1 capsule Oral BID  . amoxicillin-clavulanate  1 tablet Oral BID  . docusate sodium  100 mg Oral BID  . heparin  5,000 Units Subcutaneous Q8H  . insulin aspart  0-9 Units Subcutaneous Q4H  . labetalol  200 mg Oral Q8H  . linaclotide  145 mcg Oral QAC breakfast  . mouth rinse  15 mL Mouth Rinse BID  . multivitamin with minerals  1 tablet Oral Daily  . omega-3 acid ethyl esters  1 g Oral BID  . pantoprazole  40 mg Oral Daily  . pravastatin  80 mg Oral q1800  . senna  2 tablet Oral Daily  . tamsulosin  0.4 mg Oral QPC supper   Continuous:  NOT:[id number] **OR** acetaminophen, HYDROcodone-acetaminophen, ondansetron  **OR** ondansetron (ZOFRAN) IV, polyethylene glycol  Assesment: He has a large right pleural effusion.  We are awaiting results of the cytology on his thoracentesis.  He has a mass in his right lung as well and depending on the results of thoracentesis he may need needle biopsy of that Principal Problem:   Pleural effusion on right Active Problems:   COPD (chronic obstructive pulmonary disease) (HCC)   Hypertension   Coronary atherosclerosis   AKI (acute kidney injury) ([place])   Elevated troponin   DM (diabetes mellitus) with peripheral vascular complication (HCC)   Iron deficiency anemia   Mass in chest   Lung nodules   Recurrent right pleural effusion   UTI (urinary tract infection)    Plan: Continue treatments.  Await pathology    LOS: 2 days   Carmeron Heady L 07/24/2017, 8:04 AM

## 2017-07-24 NOTE — Progress Notes (Signed)
PROGRESS NOTE    [name]  BMW:[id number] DOB: 07/28/1934 DOA: 07/22/2017 PCP: [name], MD    Brief Narrative:  83 year old male, resident of a nursing facility, history of hypertension diabetes, coronary artery disease and recent pneumonia status post treatment with Levaquin, presented to the emergency room with shortness of breath, cough and generalized weakness.  Found to have large right pleural effusion with possible pleural-based mass.  Underwent thoracentesis in the emergency room with removal of 1 L of fluid.  Postthoracentesis x-ray showed persistent large right pleural effusion.  He is been admitted to the hospital for further management.  Pulmonology following and feels that he may need Pleurx catheter.  Cytology from fluid is in process.  If this is found to be negative, he may need biopsy of mass.  Repeat thoracentesis ordered for 3/25   Assessment & Plan:   Principal Problem:   Pleural effusion on right Active Problems:   COPD (chronic obstructive pulmonary disease) (HCC)   Hypertension   Coronary atherosclerosis   AKI (acute kidney injury) (HCC)   Elevated troponin   DM (diabetes mellitus) with peripheral vascular complication (HCC)   Iron deficiency anemia   Mass in chest   Lung nodules   Recurrent right pleural effusion   UTI (urinary tract infection)   1. Right-sided pleural effusion with pleural-based mass.  Patient underwent thoracentesis on admission.  He had 1 L of fluid removed from right side of chest.  Follow-up chest x-ray shows persistent large right pleural effusion.  He recently completed a course of Levaquin for suspected pneumonia.  Pulmonology consulted, appreciate input.  Fluid from thoracentesis has been sent for cytology.  Cytology is negative, he will likely need biopsy of mass.  He may need Pleurx catheter for persistent effusion.  Repeat thoracentesis has been requested and will likely be performed on 3/25. 2. Acute kidney injury.   Creatinine of 2.97 on admission.  Renal ultrasound does not show any evidence of hydronephrosis.  Patient started on IV hydration and creatinine has since normalized.  Continue to hold ACE inhibitor. 3. Recent urinary tract infection.  Recently diagnosed with Proteus urinary tract infection.  Treated with a course of Augmentin.. 4. Coronary artery disease.  No complaints of chest pain at this time. 5. Insignificant rise of troponin.  No EKG changes.  Patient is chronically on aspirin and Plavix.  Will hold further Plavix since he may need repeat thoracentesis and may also need Pleurx catheter placement. 6. Diabetes.  Recent A1c of 7.3.  Metformin has been held on admission.  Continue on sliding scale insulin.  Blood sugars have been stable   DVT prophylaxis: Heparin Code Status: DNR Family Communication: No family present Disposition Plan: Return to nursing facility on discharge, once workup has been completed.  May need repeat thoracentesis prior to discharge   Consultants:   Pulmonology  Procedures:   3/22 ultrasound-guided right-sided thoracentesis with removal of 1 L  Antimicrobials:   Augmentin 3/15 > 3/24   Subjective: Overall feels that shortness of breath is better.  Continues to have productive cough.  No further vomiting today.  Objective: Vitals:   07/23/17 1416 07/23/17 2300 07/24/17 0500 07/24/17 1358  BP: 134/66 (!) 133/43 (!) 145/43 (!) 158/59  Pulse: 88 74 79 67  Resp: 18 18 18 16   Temp: 99.1 F (37.3 C) 98.2 F (36.8 C) 98.4 F (36.9 C) 98.2 F (36.8 C)  TempSrc: Oral Oral Oral Oral  SpO2: 95% 93% 95% 96%  Weight:  Height:        Intake/Output Summary (Last 24 hours) at 07/24/2017 1901 Last data filed at 07/24/2017 1748 Gross per 24 hour  Intake 340 ml  Output 1050 ml  Net -710 ml   Filed Weights   07/22/17 1443 07/22/17 2100  Weight: 83.9 kg (185 lb) 84 kg (185 lb 3 oz)    Examination:  General exam: Alert, awake, oriented x  3 Respiratory system: Diminished breath sounds at right base with crackles. respiratory effort normal. Cardiovascular system:RRR. No murmurs, rubs, gallops. Gastrointestinal system: Abdomen is nondistended, soft and nontender. No organomegaly or masses felt. Normal bowel sounds heard. Central nervous system: No focal neurological deficits. Extremities: No C/C/E, +pedal pulses Skin: No rashes, lesions or ulcers Psychiatry: Judgement and insight appear normal. Mood & affect appropriate.      Data Reviewed: I have personally reviewed following labs and imaging studies  CBC: Recent Labs  Lab 07/22/17 1240 07/22/17 1506 07/23/17 0617 07/24/17 0927  WBC 18.3* 18.5* 16.9* 17.0*  NEUTROABS 14.2* 14.5* 12.3*  --   HGB 12.0* 11.1* 12.2* 11.4*  HCT 36.6* 35.0* 37.5* 35.0*  MCV 82.1 83.1 83.1 82.4  PLT 417* 422* 421* 782*   Basic Metabolic Panel: Recent Labs  Lab 07/22/17 1240 07/22/17 1506 07/23/17 0617 07/24/17 0927  NA 138 139 142 140  K 5.0 4.3 4.4 4.1  CL 107 108 111 112*  CO2 16* 17* 18* 18*  GLUCOSE 152* 196* 108* 157*  BUN 58* 58* 57* 39*  CREATININE 2.67* 2.97* 2.05* 1.12  CALCIUM 9.2 8.8* 8.7* 8.5*   GFR: Estimated Creatinine Clearance: 52.6 mL/min (by C-G formula based on SCr of 1.12 mg/dL). Liver Function Tests: Recent Labs  Lab 07/22/17 1240 07/22/17 1506  AST 17 15  ALT 11* 12*  ALKPHOS 58 54  BILITOT 0.6 0.6  PROT 7.4 7.6  ALBUMIN 3.2* 3.2*   No results for input(s): LIPASE, AMYLASE in the last 168 hours. No results for input(s): AMMONIA in the last 168 hours. Coagulation Profile: No results for input(s): INR, PROTIME in the last 168 hours. Cardiac Enzymes: Recent Labs  Lab 07/22/17 1507 07/22/17 2243 07/23/17 0617 07/23/17 1054  TROPONINI 0.03* 0.03* 0.03* <0.03   BNP (last 3 results) No results for input(s): PROBNP in the last 8760 hours. HbA1C: No results for input(s): HGBA1C in the last 72 hours. CBG: Recent Labs  Lab 07/23/17 2319  07/24/17 0338 07/24/17 0809 07/24/17 1143 07/24/17 1716  GLUCAP 93 115* 120* 135* 124*   Lipid Profile: No results for input(s): CHOL, HDL, LDLCALC, TRIG, CHOLHDL, LDLDIRECT in the last 72 hours. Thyroid Function Tests: Recent Labs    07/22/17 1240  TSH 1.483   Anemia Panel: No results for input(s): VITAMINB12, FOLATE, FERRITIN, TIBC, IRON, RETICCTPCT in the last 72 hours. Sepsis Labs: No results for input(s): PROCALCITON, LATICACIDVEN in the last 168 hours.  Recent Results (from the past 240 hour(s))  Gram stain     Status: None   Collection Time: 07/22/17  4:20 PM  Result Value Ref Range Status   Specimen Description THORACENTESIS  Final   Special Requests NONE  Final   Gram Stain   Final    CYTOSPIN SMEAR NO ORGANISMS SEEN WBC PRESENT,BOTH PMN AND MONONUCLEAR Performed at [facility], [address], [place], [place] [address]    Report Status 07/22/2017 FINAL  Final  Acid Fast Smear (AFB)     Status: None   Collection Time: 07/22/17  4:20 PM  Result Value Ref  Range Status   AFB Specimen Processing Concentration  Final   Acid Fast Smear Negative  Final    Comment: (NOTE) Performed At: [facility] [place], [place] [id number] Rush Farmer MD VQ:[id number]    Source (AFB) PLEURAL  Final    Comment: Performed at [facility], [address] [place], [place] [address]  Culture, body fluid-bottle     Status: None (Preliminary result)   Collection Time: 07/22/17  4:20 PM  Result Value Ref Range Status   Specimen Description PLEURAL  Final   Special Requests [facility]  Final   Culture   Final    NO GROWTH 2 DAYS Performed at [facility], [address] [place], [place] [address]    Report Status PENDING  Incomplete  MRSA PCR Screening     Status: None   Collection Time: 07/22/17  9:22 PM  Result Value Ref Range Status   MRSA by PCR NEGATIVE NEGATIVE Final    Comment:        The GeneXpert MRSA Assay (FDA approved for NASAL  specimens only), is one component of a comprehensive MRSA colonization surveillance program. It is not intended to diagnose MRSA infection nor to guide or monitor treatment for MRSA infections. Performed at [facility], [address], [place], [place] [address]   Urine culture     Status: None   Collection Time: 07/23/17 12:17 AM  Result Value Ref Range Status   Specimen Description   Final    URINE, CLEAN CATCH Performed at [facility], [address] [place], [place] [address]    Special Requests   Final    NONE Performed at [facility], [address] [place], [place] [address]    Culture   Final    NO GROWTH Performed at [facility], [place] [address], [place], [place] [address]    Report Status 07/24/2017 FINAL  Final         Radiology Studies: No results found.      Scheduled Meds: . acidophilus  1 capsule Oral BID  . amoxicillin-clavulanate  1 tablet Oral BID  . docusate sodium  100 mg Oral BID  . heparin  5,000 Units Subcutaneous Q8H  . insulin aspart  0-9 Units Subcutaneous Q4H  . labetalol  200 mg Oral Q8H  . linaclotide  145 mcg Oral QAC breakfast  . mouth rinse  15 mL Mouth Rinse BID  . multivitamin with minerals  1 tablet Oral Daily  . omega-3 acid ethyl esters  1 g Oral BID  . pantoprazole  40 mg Oral Daily  . pravastatin  80 mg Oral q1800  . senna  2 tablet Oral Daily  . tamsulosin  0.4 mg Oral QPC supper   Continuous Infusions:   LOS: 2 days    Time spent: 25 mins    Kathie Dike, MD [facility] Pager [phone]  If 7PM-7AM, please contact night-coverage [email or web address] Password [email or web address] 07/24/2017, 7:01 PM

## 2017-07-25 ENCOUNTER — Inpatient Hospital Stay (HOSPITAL_COMMUNITY): Payer: Medicare Other

## 2017-07-25 ENCOUNTER — Telehealth: Payer: Self-pay

## 2017-07-25 DIAGNOSIS — Z9889 Other specified postprocedural states: Secondary | ICD-10-CM

## 2017-07-25 DIAGNOSIS — D500 Iron deficiency anemia secondary to blood loss (chronic): Secondary | ICD-10-CM

## 2017-07-25 DIAGNOSIS — C384 Malignant neoplasm of pleura: Secondary | ICD-10-CM | POA: Diagnosis not present

## 2017-07-25 DIAGNOSIS — J910 Malignant pleural effusion: Secondary | ICD-10-CM | POA: Diagnosis not present

## 2017-07-25 LAB — CBC
HCT: 36.3 % — ABNORMAL LOW (ref 39.0–52.0)
HEMOGLOBIN: 11.6 g/dL — AB (ref 13.0–17.0)
MCH: 26.5 pg (ref 26.0–34.0)
MCHC: 32 g/dL (ref 30.0–36.0)
MCV: 82.9 fL (ref 78.0–100.0)
PLATELETS: 445 10*3/uL — AB (ref 150–400)
RBC: 4.38 MIL/uL (ref 4.22–5.81)
RDW: 14.1 % (ref 11.5–15.5)
WBC: 17.6 10*3/uL — ABNORMAL HIGH (ref 4.0–10.5)

## 2017-07-25 LAB — BASIC METABOLIC PANEL
Anion gap: 11 (ref 5–15)
BUN: 27 mg/dL — ABNORMAL HIGH (ref 6–20)
CO2: 19 mmol/L — AB (ref 22–32)
CREATININE: 0.97 mg/dL (ref 0.61–1.24)
Calcium: 8.5 mg/dL — ABNORMAL LOW (ref 8.9–10.3)
Chloride: 112 mmol/L — ABNORMAL HIGH (ref 101–111)
GFR calc Af Amer: >60 mL/min (ref >60)
GFR calc non Af Amer: >60 mL/min (ref >60)
GLUCOSE: 96 mg/dL (ref 65–99)
Potassium: 4.1 mmol/L (ref 3.5–5.1)
Sodium: 142 mmol/L (ref 135–145)

## 2017-07-25 LAB — GLUCOSE, CAPILLARY
GLUCOSE-CAPILLARY: 91 mg/dL (ref 65–99)
GLUCOSE-CAPILLARY: 130 mg/dL — AB (ref 65–99)
GLUCOSE-CAPILLARY: 132 mg/dL — AB (ref 65–99)
Glucose-Capillary: 100 mg/dL — ABNORMAL HIGH (ref 65–99)
Glucose-Capillary: 109 mg/dL — ABNORMAL HIGH (ref 65–99)
Glucose-Capillary: 111 mg/dL — ABNORMAL HIGH (ref 65–99)

## 2017-07-25 MED ORDER — INSULIN ASPART 100 UNIT/ML ~~LOC~~ SOLN: 0.0000 [IU] | Freq: Every day | SUBCUTANEOUS | Status: DC

## 2017-07-25 MED ORDER — INSULIN ASPART 100 UNIT/ML ~~LOC~~ SOLN
0.0000 [IU] | Freq: Three times a day (TID) | SUBCUTANEOUS | Status: DC
  Administered 2017-07-25: 1 [IU] via SUBCUTANEOUS
  Administered 2017-07-26 – 2017-07-27 (×2): 2 [IU] via SUBCUTANEOUS
  Administered 2017-07-27: 1 [IU] via SUBCUTANEOUS
  Administered 2017-07-28: 2 [IU] via SUBCUTANEOUS
  Administered 2017-07-28: 1 [IU] via SUBCUTANEOUS
  Administered 2017-07-28 – 2017-07-29 (×2): 2 [IU] via SUBCUTANEOUS
  Administered 2017-07-30 (×2): 1 [IU] via SUBCUTANEOUS
  Administered 2017-07-30: 2 [IU] via SUBCUTANEOUS

## 2017-07-25 NOTE — Telephone Encounter (Signed)
Possible re-admission to facility. This is a patient you were seeing at [facility]. [facility] F/U is needed if patient was re-admitted to facility upon discharge. Hospital discharge from [place] on 07/22/2017

## 2017-07-25 NOTE — Progress Notes (Signed)
Thoracentesis complete no signs of distress, 1.7L dark red colored ascites removed.

## 2017-07-25 NOTE — Progress Notes (Signed)
No significant change.  Awaiting cytology on pleural fluid.

## 2017-07-25 NOTE — Progress Notes (Addendum)
Progress Note    [name]  GYJ:[id number] DOB: [birth year]  DOA: 07/22/2017 PCP: [name], MD    Brief Narrative:   Chief complaint: F/U shortness of breath  Medical records reviewed and are as summarized below:  [name] is an 83 y.o. male with a PMH of hypertension, type 2 diabetes, CAD, tobacco abuse,recent pneumonia status post treatment with Levaquin and recent UTI status post treatment with Augmentin who was admitted 07/22/17 for evaluation of malaise, anorexia, shortness of breath and cough. Initial workup revealed a large right pleural effusion with a possible pleural-based mass. Had a 1 L paracentesis on admission with a post thoracentesis x-ray showing persistent large right pleural effusion. Pulmonology onboard.  Assessment/Plan:   Principal Problem:   Pleural effusion with possible pleural based mass on right Status post 1 L thoracentesis. Cytology pending. Follow-up chest x-ray shows persistent large pleural effusion. Pulmonology onboard.Repeat thoracentesis done today with another 1.7 L of dark red colored pleuritic fluid removed.  Postprocedure x-ray personally reviewed and is is pictured below showing persistent pleural effusion. Will ask palliative care to see given likelihood of malignancy.  Active Problems:   COPD (chronic obstructive pulmonary disease) (HCC) Appears to be stable. Oxygen saturations 92-100 percent.    Hypertension Continue labetalol. Blood pressure reasonably well-controlled.    Coronary atherosclerosis Continue statin and Lovaza.    AKI (acute kidney injury) ([place]) Renal ultrasound negative for hydronephrosis.Creatinine 2.67 on admission, now WNL.Likely prerenal in etiology.    Elevated troponin Mild with trend flat. No reports of chest pain. No EKG changes noted. Aspirin/Plavix on hold pending thoracentesis.    DM (diabetes mellitus) with peripheral vascular complication (HCC) Recent hemoglobin A1c 7.3%.Metformin remains  on hold.Currently on insulin sensitive SSI every 4 hours. CBGs 91-168. Change SSI to every before meals/at bedtime.    Iron deficiency anemia Hemoglobin stable.    Proteus UTI Status post treatment course with Augmentin.    Leukocytosis Pleural fluid culture from 07/22/17 negative.  Possibly inflammatory/reactive.  Body mass index is 27.35 kg/m.   Family Communication/Anticipated D/C date and plan/Code Status   DVT prophylaxis: Heparin ordered. Code Status: DO NOT RESUSCITATE Family Communication: No family present at the bedside. Disposition Plan: From SNF.  Follow-up cytology.   Medical Consultants:    Pulmonology.   Anti-Infectives:    Augmentin 3/15 > 3/24  Subjective:   Patient reports that his bottom is tender.  He denies shortness of breath, nausea, and vomiting.  Occasionally has a cough.  Reports that he has limited energy.  Objective:    Vitals:   07/24/17 0500 07/24/17 1358 07/24/17 2120 07/25/17 0500  BP: (!) 145/43 (!) 158/59 (!) 141/55 (!) 153/84  Pulse: 79 67 72 78  Resp: 18 16 18 17   Temp: 98.4 F (36.9 C) 98.2 F (36.8 C) 98 F (36.7 C) 98.2 F (36.8 C)  TempSrc: Oral Oral Oral Oral  SpO2: 95% 96% 92% 100%  Weight:      Height:        Intake/Output Summary (Last 24 hours) at 07/25/2017 0741 Last data filed at 07/25/2017 0100 Gross per 24 hour  Intake 240 ml  Output 550 ml  Net -310 ml   Filed Weights   07/22/17 1443 07/22/17 2100  Weight: 83.9 kg (185 lb) 84 kg (185 lb 3 oz)    Exam: General: No acute distress.  Frail appearing. Cardiovascular: Heart sounds show a regular rate, and rhythm. No gallops or rubs. No murmurs.  No JVD. Lungs: Decreased breath sounds right base Abdomen: Soft, nontender, nondistended with normal active bowel sounds. No masses. No hepatosplenomegaly. Neurological: Alert and oriented 3. Moves all extremities 4 with equal strength. Cranial nerves II through XII grossly intact. Skin: Warm and dry. No  rashes or lesions. Extremities: No clubbing or cyanosis. No edema. Pedal pulses 2+. Psychiatric: Mood and affect are normal. Insight and judgment are fair.   Data Reviewed:   I have personally reviewed following labs and imaging studies:  Labs: Labs show the following:   Basic Metabolic Panel: Recent Labs  Lab 07/22/17 1240 07/22/17 1506 07/23/17 0617 07/24/17 0927 07/25/17 0513  NA 138 139 142 140 142  K 5.0 4.3 4.4 4.1 4.1  CL 107 108 111 112* 112*  CO2 16* 17* 18* 18* 19*  GLUCOSE 152* 196* 108* 157* 96  BUN 58* 58* 57* 39* 27*  CREATININE 2.67* 2.97* 2.05* 1.12 0.97  CALCIUM 9.2 8.8* 8.7* 8.5* 8.5*   GFR Estimated Creatinine Clearance: 60.7 mL/min (by C-G formula based on SCr of 0.97 mg/dL). Liver Function Tests: Recent Labs  Lab 07/22/17 1240 07/22/17 1506  AST 17 15  ALT 11* 12*  ALKPHOS 58 54  BILITOT 0.6 0.6  PROT 7.4 7.6  ALBUMIN 3.2* 3.2*    CBC: Recent Labs  Lab 07/22/17 1240 07/22/17 1506 07/23/17 0617 07/24/17 0927 07/25/17 0513  WBC 18.3* 18.5* 16.9* 17.0* 17.6*  NEUTROABS 14.2* 14.5* 12.3*  --   --   HGB 12.0* 11.1* 12.2* 11.4* 11.6*  HCT 36.6* 35.0* 37.5* 35.0* 36.3*  MCV 82.1 83.1 83.1 82.4 82.9  PLT 417* 422* 421* 405* 445*   Cardiac Enzymes: Recent Labs  Lab 07/22/17 1507 07/22/17 2243 07/23/17 0617 07/23/17 1054  TROPONINI 0.03* 0.03* 0.03* <0.03   CBG: Recent Labs  Lab 07/24/17 1716 07/24/17 1956 07/24/17 2328 07/25/17 0341 07/25/17 0739  GLUCAP 124* 168* 114* 91 100*   Thyroid function studies: Recent Labs    07/22/17 1240  TSH 1.483   Microbiology Recent Results (from the past 240 hour(s))  Gram stain     Status: None   Collection Time: 07/22/17  4:20 PM  Result Value Ref Range Status   Specimen Description THORACENTESIS  Final   Special Requests NONE  Final   Gram Stain   Final    CYTOSPIN SMEAR NO ORGANISMS SEEN WBC PRESENT,BOTH PMN AND MONONUCLEAR Performed at [facility], [address]  [place], [place] [address]    Report Status 07/22/2017 FINAL  Final  Acid Fast Smear (AFB)     Status: None   Collection Time: 07/22/17  4:20 PM  Result Value Ref Range Status   AFB Specimen Processing Concentration  Final   Acid Fast Smear Negative  Final    Comment: (NOTE) Performed At: [facility] [address] [place], [place] [id number] Rush Farmer MD TO:[id number]    Source (AFB) PLEURAL  Final    Comment: Performed at [facility], [address], [place], [place] [address]  Culture, body fluid-bottle     Status: None (Preliminary result)   Collection Time: 07/22/17  4:20 PM  Result Value Ref Range Status   Specimen Description PLEURAL  Final   Special Requests [facility]  Final   Culture   Final    NO GROWTH 2 DAYS Performed at [facility], [address], [place],  [address]    Report Status PENDING  Incomplete  MRSA PCR Screening     Status: None  Collection Time: 07/22/17  9:22 PM  Result Value Ref Range Status   MRSA by PCR NEGATIVE NEGATIVE Final    Comment:        The GeneXpert MRSA Assay (FDA approved for NASAL specimens only), is one component of a comprehensive MRSA colonization surveillance program. It is not intended to diagnose MRSA infection nor to guide or monitor treatment for MRSA infections. Performed at [facility], [address] [place], [place] [address]   Urine culture     Status: None   Collection Time: 07/23/17 12:17 AM  Result Value Ref Range Status   Specimen Description   Final    URINE, CLEAN CATCH Performed at [facility], [address] [place], [place] [address]    Special Requests   Final    NONE Performed at [facility], [address] [place], [place] [address]    Culture   Final    NO GROWTH Performed at [facility], [place] [address] [place], [place] [address]    Report Status 07/24/2017 FINAL  Final    Procedures and diagnostic studies:  No results found.  Medications:   . acidophilus  1  capsule Oral BID  . docusate sodium  100 mg Oral BID  . heparin  5,000 Units Subcutaneous Q8H  . insulin aspart  0-9 Units Subcutaneous Q4H  . labetalol  200 mg Oral Q8H  . linaclotide  145 mcg Oral QAC breakfast  . mouth rinse  15 mL Mouth Rinse BID  . multivitamin with minerals  1 tablet Oral Daily  . omega-3 acid ethyl esters  1 g Oral BID  . pantoprazole  40 mg Oral Daily  . pravastatin  80 mg Oral q1800  . senna  2 tablet Oral Daily  . tamsulosin  0.4 mg Oral QPC supper   Continuous Infusions:   LOS: 3 days   Jacquelynn Cree  [facility] Pager [phone]. If unable to reach me by pager, please call my cell phone at [phone].  *Please refer to [email or web address], password [email or web address] to get updated schedule on who will round on this patient, as hospitalists switch teams weekly. If 7PM-7AM, please contact night-coverage at [email or web address], password [email or web address] for any overnight needs.  07/25/2017, 7:41 AM

## 2017-07-25 NOTE — Procedures (Signed)
PreOperative Dx: RT pleural effusion Postoperative Dx: RT pleural effusion Procedure:   US guided RT thoracentesis Radiologist:  Thornton Papas Anesthesia:  10 ml of 1% lidocaine Specimen:  1.7 L of dark old bloody colored fluid EBL:   < 1 ml Complications: None

## 2017-07-26 ENCOUNTER — Encounter (HOSPITAL_COMMUNITY): Payer: Self-pay | Admitting: Primary Care

## 2017-07-26 DIAGNOSIS — Z7189 Other specified counseling: Secondary | ICD-10-CM

## 2017-07-26 DIAGNOSIS — Z515 Encounter for palliative care: Secondary | ICD-10-CM

## 2017-07-26 LAB — CBC
HEMATOCRIT: 39.3 % (ref 39.0–52.0)
HEMOGLOBIN: 12.7 g/dL — AB (ref 13.0–17.0)
MCH: 26.8 pg (ref 26.0–34.0)
MCHC: 32.3 g/dL (ref 30.0–36.0)
MCV: 83.1 fL (ref 78.0–100.0)
Platelets: 457 10*3/uL — ABNORMAL HIGH (ref 150–400)
RBC: 4.73 MIL/uL (ref 4.22–5.81)
RDW: 14 % (ref 11.5–15.5)
WBC: 19.3 10*3/uL — ABNORMAL HIGH (ref 4.0–10.5)

## 2017-07-26 LAB — BASIC METABOLIC PANEL
Anion gap: 11 (ref 5–15)
BUN: 20 mg/dL (ref 6–20)
CHLORIDE: 111 mmol/L (ref 101–111)
CO2: 20 mmol/L — ABNORMAL LOW (ref 22–32)
CREATININE: 0.93 mg/dL (ref 0.61–1.24)
Calcium: 8.7 mg/dL — ABNORMAL LOW (ref 8.9–10.3)
GFR calc Af Amer: >60 mL/min (ref >60)
GFR calc non Af Amer: >60 mL/min (ref >60)
Glucose, Bld: 124 mg/dL — ABNORMAL HIGH (ref 65–99)
Potassium: 4.2 mmol/L (ref 3.5–5.1)
Sodium: 142 mmol/L (ref 135–145)

## 2017-07-26 LAB — GLUCOSE, CAPILLARY
GLUCOSE-CAPILLARY: 98 mg/dL (ref 65–99)
GLUCOSE-CAPILLARY: 132 mg/dL — AB (ref 65–99)
Glucose-Capillary: 109 mg/dL — ABNORMAL HIGH (ref 65–99)
Glucose-Capillary: 170 mg/dL — ABNORMAL HIGH (ref 65–99)
Glucose-Capillary: 170 mg/dL — ABNORMAL HIGH (ref 65–99)

## 2017-07-26 NOTE — Progress Notes (Signed)
He says he feels much better.  He had repeat thoracentesis and had 1.7 L removed.  He says his breathing is better.  We are still awaiting cytology.

## 2017-07-26 NOTE — Consult Note (Signed)
Consultation Note Date: 07/26/2017   Patient Name: [name]  DOB: [birth year]  MRN: [id number]  Age / Sex: 83 y.o., male  PCP: [name], MD Referring Physician: Rama, Venetia Maxon, MD  Reason for Consultation: Establishing goals of care and Psychosocial/spiritual support  HPI/Patient Profile: 83 y.o. male  with past medical history of HTN, DM 2, CAD, CABG 2000, CVA w/ left weakness/dysphagia, COPD, blindness, recent pneumonia status post treatment with Levaquin, and recent UTI, SNF resident for around 2 years, admitted on 07/22/2017 with Pleural effusion with possible pleural based mass on right.   Clinical Assessment and Goals of Care: Mr. Recupero is resting quietly in bed.  He is alert and oriented, calm and cooperative.  There is no family at bedside at this time.  I ask him to tell me what he understands about why he is in the hospital.  He shares that he had fluid drawn off of his lung.  We talked about "what if's", without mentioning that he may have cancer.  I ask who make healthcare decisions if he is unable.  He states that his granddaughter, [name] is his responsible party.  No phone number listed in chart.  Call to daughter, [name].  She shares that her daughter [name] is a responsible party at [facility], but she and her sister would be decision makers while her father is in the hospital.  I share that we still do not have cytology/test results from pleural fluid.  We plan for family meeting 3/27 at around 9 to 9:30 AM.   Healthcare Power of Attorney  NEXT OF KIN -Mr. Stefanski states that his granddaughter, [name] is his healthcare surrogate.  Responsible party at [facility] is grand daughter [name] phone [phone].  Call to daughter, [name].  She states that there is no legal paperwork naming [name], she is just "over" Mr. Grieser at [facility].   [name] states that she and her sister are Garment/textile technologist while Mr. Pagnotta is in the hospital.   SUMMARY OF RECOMMENDATIONS   Continue to treat the treatable but no CPR, no intubation. Treatment plan based on results from pleural fluid testing. Mr. Likes states that he is open to Pleurx drain to help relieve symptom burden from pleural effusion.  Code Status/Advance Care Planning:  DNR  Symptom Management:   Per PCP, no additional needs   Palliative Prophylaxis:   Frequent Pain Assessment  Additional Recommendations (Limitations, Scope, Preferences):  treat the treatable, but no CPR, no intubation.   Psycho-social/Spiritual:   Desire for further Chaplaincy support:no  Additional Recommendations: Caregiving  Support/Resources and Education on Hospice  Prognosis:   < 6 months or less would not be surprising, based on current health status, and likely lung cancer.   Discharge Planning: Goal is to return to [facility] as resident.       Primary Diagnoses: Present on Admission: . COPD (chronic obstructive pulmonary disease) ([place]) . Coronary atherosclerosis . DM (diabetes mellitus) with peripheral vascular complication ([place]) . Hypertension .  Iron deficiency anemia . AKI (acute kidney injury) ([place]) . Pleural effusion on right . Mass in chest . Lung nodules . Elevated troponin . Recurrent right pleural effusion . UTI (urinary tract infection)   I have reviewed the medical record, interviewed the patient and family, and examined the patient. The following aspects are pertinent.  Past Medical History:  Diagnosis Date  . Anemia due to chronic blood loss   . CAD (coronary artery disease)    cath 11/2007: 70% LM ISR, SVG-LAD OK, SVG-D1 100%, IMA-D2 OK, SVG-OM OK, 4-vessel CABG 2000.  Marland Kitchen CAD (coronary artery disease)    s/p of 95% L main artery stenosis, 4/03: cutting balloon PTCA of 80% in-stent restenosis, 7/04. preserved L ventriculr function  . Carotid bruit     bilateral. no sigificant distal abd atherosclerosis by recent cath.   . Chronic bronchitis ([place])   . Constipation   . COPD (chronic obstructive pulmonary disease) ([facility])    ongoing tobacco   . CVA (cerebral infarction)    left sided weakness  . DM2 (diabetes mellitus, type 2) ([place])   . Dyslipidemia   . Dysphagia   . Glaucoma   . HTN (hypertension)    Social History   Socioeconomic History  . Marital status: Married    Spouse name: Not on file  . Number of children: Not on file  . Years of education: Not on file  . Highest education level: Not on file  Occupational History  . Not on file  Social Needs  . Financial resource strain: Not on file  . Food insecurity:    Worry: Not on file    Inability: Not on file  . Transportation needs:    Medical: Not on file    Non-medical: Not on file  Tobacco Use  . Smoking status: Former Smoker    Last attempt to quit: 08/03/2014    Years since quitting: 2.9  . Smokeless tobacco: Never Used  . Tobacco comment: 50 pack year hx ; not smoking @ [facility]  Substance and Sexual Activity  . Alcohol use: No    Alcohol/week: 0.0 oz    Comment: has not had alcohol in 30-40 years   . Drug use: No  . Sexual activity: Not on file  Lifestyle  . Physical activity:    Days per week: Not on file    Minutes per session: Not on file  . Stress: Not on file  Relationships  . Social connections:    Talks on phone: Not on file    Gets together: Not on file    Attends religious service: Not on file    Active member of club or organization: Not on file    Attends meetings of clubs or organizations: Not on file    Relationship status: Not on file  Other Topics Concern  . Not on file  Social History Narrative  . Not on file   Family History  Problem Relation Age of Onset  . Hypertension Mother   . Stroke Maternal Grandmother   . Cancer Neg Hx   . Diabetes Neg Hx   . Heart disease Neg Hx    Scheduled Meds: . acidophilus  1 capsule Oral BID  .  docusate sodium  100 mg Oral BID  . heparin  5,000 Units Subcutaneous Q8H  . insulin aspart  0-5 Units Subcutaneous QHS  . insulin aspart  0-9 Units Subcutaneous TID WC  . labetalol  200 mg Oral Q8H  . linaclotide  145 mcg Oral QAC breakfast  . mouth rinse  15 mL Mouth Rinse BID  . multivitamin with minerals  1 tablet Oral Daily  . omega-3 acid ethyl esters  1 g Oral BID  . pantoprazole  40 mg Oral Daily  . pravastatin  80 mg Oral q1800  . senna  2 tablet Oral Daily  . tamsulosin  0.4 mg Oral QPC supper   Continuous Infusions: PRN Meds:.acetaminophen **OR** acetaminophen, HYDROcodone-acetaminophen, ondansetron **OR** ondansetron (ZOFRAN) IV, polyethylene glycol Medications Prior to Admission:  Prior to Admission medications   Medication Sig Start Date End Date Taking? Authorizing Provider  amLODipine (NORVASC) 5 MG tablet Take 5 mg by mouth daily.   Yes [provider]  amoxicillin-clavulanate (AUGMENTIN) 500-125 MG tablet Take 1 tablet by mouth 2 (two) times daily. 07/16/17 07/24/18 Yes [provider]  Calcium Carbonate-Vitamin D (OSCAL 500/200 D-3 PO) 1 Tablet once a day by mouth . Can't be given with iron   Yes [provider]  clopidogrel (PLAVIX) 75 MG tablet Take 75 mg by mouth daily.   Yes [provider]  docusate sodium (COLACE) 100 MG capsule Take 100 mg by mouth 2 (two) times daily.   Yes [provider]  ferrous sulfate (QC FERROUS SULFATE) 325 (65 FE) MG tablet Take 325 mg by mouth daily.    Yes [provider]  labetalol (NORMODYNE) 200 MG tablet Take 200 mg by mouth 3 (three) times daily. Reported on 10/22/2015   Yes [provider]  linaclotide (LINZESS) 145 MCG CAPS capsule Take 145 mcg by mouth daily before breakfast.   Yes [provider]  lisinopril (PRINIVIL,ZESTRIL) 40 MG tablet Take 40 mg by mouth daily.   Yes [provider]  metFORMIN (GLUCOPHAGE) 500 MG tablet Take 500 mg by mouth 2  (two) times daily with a meal.    Yes [provider]  Multiple Vitamin (MULTIVITAMIN) tablet Take 1 tablet by mouth daily.   Yes [provider]  Omega-3 Fatty Acids (FISH OIL) 1000 MG CAPS Take 1,000 mg by mouth 3 (three) times daily.   Yes [provider]  pantoprazole (PROTONIX) 40 MG tablet Take 40 mg by mouth daily.   Yes [provider]  pravastatin (PRAVACHOL) 80 MG tablet Take 80 mg by mouth daily.   Yes [provider]  Probiotic Product (RISA-BID PROBIOTIC) TABS Take 1 tablet by mouth twice a day from //-//   Yes [provider]  senna (SENOKOT) 8.6 MG TABS tablet Take 2 tablets (17.2 mg total) by mouth daily. 03/10/15  Yes Short, Noah Delaine, MD  tamsulosin (FLOMAX) 0.4 MG CAPS capsule Take 0.4 mg by mouth daily after supper.   Yes [provider]   No Known Allergies Review of Systems  Unable to perform ROS: Age    Physical Exam  Constitutional: No distress.  HENT:  Head: Atraumatic.  Eyes:  Legally blind  Pulmonary/Chest: Effort normal. No respiratory distress.  Abdominal: Soft. He exhibits no distension.  Musculoskeletal: He exhibits no edema.  Neurological: He is alert.  Skin: Skin is warm and dry.  Psychiatric: He has a normal mood and affect.  Calm and cooperative  Nursing note and vitals reviewed.   Vital Signs: BP (!) 157/67 (BP Location: Left Arm)   Pulse 97   Temp 98.3 F (36.8 C)   Resp 14   Ht 5\' 9"  (1.753 m)   Wt 84 kg (185 lb 3 oz)   SpO2 99%   BMI 27.35  kg/m  Pain Scale: 0-10   Pain Score: 0-No pain   SpO2: SpO2: 99 % O2 Device:SpO2: 99 % O2 Flow Rate: .O2 Flow Rate (L/min): 2 L/min  IO: Intake/output summary:   Intake/Output Summary (Last 24 hours) at 07/26/2017 1353 Last data filed at 07/25/2017 1800 Gross per 24 hour  Intake 120 ml  Output -  Net 120 ml    LBM: Last BM Date: 07/24/17 Baseline Weight: Weight: 83.9 kg (185 lb) Most recent weight: Weight: 84 kg (185 lb 3  oz)     Palliative Assessment/Data:   Flowsheet Rows     Most Recent Value  Intake Tab  Referral Department  Hospitalist  Unit at Time of Referral  Cardiac/Telemetry Unit  Palliative Care Primary Diagnosis  Pulmonary  Date Notified  07/25/17  Palliative Care Type  New Palliative care  Reason for referral  Clarify Goals of Care, Psychosocial or Spiritual support  Date of Admission  07/22/17  Date first seen by Palliative Care  07/26/17  # of days Palliative referral response time  1 Day(s)  # of days IP prior to Palliative referral  3  Clinical Assessment  Palliative Performance Scale Score  40%  Pain Max last 24 hours  Not able to report  Pain Min Last 24 hours  Not able to report  Dyspnea Max Last 24 Hours  Not able to report  Dyspnea Min Last 24 hours  Not able to report  Psychosocial & Spiritual Assessment  Palliative Care Outcomes  Patient/Family meeting held?  Yes  Who was at the meeting?  patient at bedside.   Patient/Family wishes: Interventions discontinued/not started   Mechanical Ventilation      Time In:      1120  - 1310 Time Out:    1140  1340 Time Total:  20 +  30 = 50 minutes Greater than 50%  of this time was spent counseling and coordinating care related to the above assessment and plan.  Signed by: Drue Novel, NP   Please contact Palliative Medicine Team phone at [phone] for questions and concerns.  For individual provider: See Shea Evans

## 2017-07-26 NOTE — Progress Notes (Signed)
Progress Note    [name]  XTK:[id number] DOB: 07/01/1934  DOA: 07/22/2017 PCP: [name], MD    Brief Narrative:   Chief complaint: F/U shortness of breath  Medical records reviewed and are as summarized below:  [name] is an 83 y.o. male with a PMH of hypertension, type 2 diabetes, CAD, tobacco abuse,recent pneumonia status post treatment with Levaquin and recent UTI status post treatment with Augmentin who was admitted 07/22/17 for evaluation of malaise, anorexia, shortness of breath and cough. Initial workup revealed a large right pleural effusion with a possible pleural-based mass. Had a 1 L paracentesis on admission with a post thoracentesis x-ray showing persistent large right pleural effusion. Pulmonology onboard.  Assessment/Plan:   Principal Problem:   Pleural effusion with possible pleural based mass on right Status post 1 L thoracentesis 07/22/17. Cytology pending. Follow-up chest x-ray shows persistent large pleural effusion. Pulmonology onboard.Repeat thoracentesis done 07/25/17 with another 1.7 L of dark red colored pleuritic fluid removed.  Postprocedure x-ray showed a persistent pleural effusion. Given likelihood of poor prognosis, palliative care has been consulted. Family updated about the differential.  Active Problems:   COPD (chronic obstructive pulmonary disease) (HCC) Appears to be stable. Oxygen saturations 95-97 percent.    Hypertension Continue labetalol. Blood pressure reasonably well-controlled.    Coronary atherosclerosis Continue statin and Lovaza.    AKI (acute kidney injury) ([place]) Renal ultrasound negative for hydronephrosis.Creatinine 2.67 on admission, now WNL.Likely prerenal in etiology.    Elevated troponin Mild with trend flat. No reports of chest pain. No EKG changes noted. Aspirin/Plavix on hold pending thoracentesis. Will continue to hold pending definition of further treatment options.    DM (diabetes mellitus) with  peripheral vascular complication (H[facility]) Recent hemoglobin A1c 7.3%.Metformin remains on hold. Currently on insulin sensitive SSI every Q AC/HS. CBGs 98-132.     Iron deficiency anemia Hemoglobin stable.    Proteus UTI Status post treatment course with Augmentin.    Leukocytosis Pleural fluid culture from 07/22/17 negative.  Possibly inflammatory/reactive.  Body mass index is 27.35 kg/m.   Family Communication/Anticipated D/C date and plan/Code Status   DVT prophylaxis: Heparin ordered. Code Status: DO NOT RESUSCITATE Family Communication: Daughter in laws at the bedside. Disposition Plan: From SNF.  Follow-up cytology.   Medical Consultants:    Pulmonology.   Anti-Infectives:    Augmentin 3/15 > 3/24  Subjective:   The patient reports poor appetite. Denies shortness of breath, cough, nausea, pain.  Objective:    Vitals:   07/25/17 1336 07/25/17 1500 07/25/17 2007 07/25/17 2242  BP: (!) 161/79 (!) 147/58  130/72  Pulse: 84 68  77  Resp: 20 20  14   Temp:  (!) 97.5 F (36.4 C)  97.8 F (36.6 C)  TempSrc:  Oral  Oral  SpO2: 95% 97% 96% 96%  Weight:      Height:        Intake/Output Summary (Last 24 hours) at 07/26/2017 0722 Last data filed at 07/25/2017 1800 Gross per 24 hour  Intake 480 ml  Output -  Net 480 ml   Filed Weights   07/22/17 1443 07/22/17 2100  Weight: 83.9 kg (185 lb) 84 kg (185 lb 3 oz)    Exam: General: No acute distress. Frail appearing. Nontoxic appearing. Cardiovascular: Heart sounds show a regular rate, and rhythm. No gallops or rubs. No murmurs. No JVD. Lungs: Clear to auscultation bilaterally with good air movement. No rales, rhonchi or wheezes. Abdomen: Soft,  nontender, nondistended with normal active bowel sounds. No masses. No hepatosplenomegaly. Skin: Warm and dry. No rashes or lesions. Extremities: No clubbing or cyanosis. No edema. Pedal pulses 2+.   Data Reviewed:   I have personally reviewed following labs and  imaging studies:  Labs: Labs show the following:   Basic Metabolic Panel: Recent Labs  Lab 07/22/17 1506 07/23/17 0617 07/24/17 0927 07/25/17 0513 07/26/17 0647  NA 139 142 140 142 142  K 4.3 4.4 4.1 4.1 4.2  CL 108 111 112* 112* 111  CO2 17* 18* 18* 19* 20*  GLUCOSE 196* 108* 157* 96 124*  BUN 58* 57* 39* 27* 20  CREATININE 2.97* 2.05* 1.12 0.97 0.93  CALCIUM 8.8* 8.7* 8.5* 8.5* 8.7*   GFR Estimated Creatinine Clearance: 63.4 mL/min (by C-G formula based on SCr of 0.93 mg/dL). Liver Function Tests: Recent Labs  Lab 07/22/17 1240 07/22/17 1506  AST 17 15  ALT 11* 12*  ALKPHOS 58 54  BILITOT 0.6 0.6  PROT 7.4 7.6  ALBUMIN 3.2* 3.2*    CBC: Recent Labs  Lab 07/22/17 1240 07/22/17 1506 07/23/17 0617 07/24/17 0927 07/25/17 0513 07/26/17 0647  WBC 18.3* 18.5* 16.9* 17.0* 17.6* 19.3*  NEUTROABS 14.2* 14.5* 12.3*  --   --   --   HGB 12.0* 11.1* 12.2* 11.4* 11.6* 12.7*  HCT 36.6* 35.0* 37.5* 35.0* 36.3* 39.3  MCV 82.1 83.1 83.1 82.4 82.9 83.1  PLT 417* 422* 421* 405* 445* 457*   Cardiac Enzymes: Recent Labs  Lab 07/22/17 1507 07/22/17 2243 07/23/17 0617 07/23/17 1054  TROPONINI 0.03* 0.03* 0.03* <0.03   CBG: Recent Labs  Lab 07/25/17 1105 07/25/17 1627 07/25/17 2007 07/25/17 2351 07/26/17 0346  GLUCAP 130* 109* 132* 111* 20   Microbiology Recent Results (from the past 240 hour(s))  Gram stain     Status: None   Collection Time: 07/22/17  4:20 PM  Result Value Ref Range Status   Specimen Description THORACENTESIS  Final   Special Requests NONE  Final   Gram Stain   Final    CYTOSPIN SMEAR NO ORGANISMS SEEN WBC PRESENT,BOTH PMN AND MONONUCLEAR Performed at [facility], [address] [place], [place] [address]    Report Status 07/22/2017 FINAL  Final  Acid Fast Smear (AFB)     Status: None   Collection Time: 07/22/17  4:20 PM  Result Value Ref Range Status   AFB Specimen Processing Concentration  Final   Acid Fast Smear Negative  Final     Comment: (NOTE) Performed At: [facility] [address] [place], [place] [id number] Rush Farmer MD [id number]    Source (AFB) PLEURAL  Final    Comment: Performed at [facility], [address], [place], [place] [address]  Culture, body fluid-bottle     Status: None (Preliminary result)   Collection Time: 07/22/17  4:20 PM  Result Value Ref Range Status   Specimen Description PLEURAL  Final   Special Requests BOTTLES DRAWN AEROBIC AND ANAEROBIC 10CC  Final   Culture   Final    NO GROWTH 3 DAYS Performed at [facility], [address], [place], [place] [address]    Report Status PENDING  Incomplete  MRSA PCR Screening     Status: None   Collection Time: 07/22/17  9:22 PM  Result Value Ref Range Status   MRSA by PCR NEGATIVE NEGATIVE Final    Comment:        The GeneXpert MRSA Assay (FDA approved for NASAL specimens only),  is one component of a comprehensive MRSA colonization surveillance program. It is not intended to diagnose MRSA infection nor to guide or monitor treatment for MRSA infections. Performed at [facility], [address] [place], [place] [address]   Urine culture     Status: None   Collection Time: 07/23/17 12:17 AM  Result Value Ref Range Status   Specimen Description   Final    URINE, CLEAN CATCH Performed at [facility], [address], [place], [place] [address]    Special Requests   Final    NONE Performed at [facility], [address], [place], [place] [address]    Culture   Final    NO GROWTH Performed at [facility], [place] [address], [place], [place] [address]    Report Status 07/24/2017 FINAL  Final    Procedures and diagnostic studies:  Dg Chest 1 View  Result Date: 07/25/2017 CLINICAL DATA:  RIGHT pleural effusion post thoracentesis EXAM: CHEST  1 VIEW COMPARISON:  07/22/2017 FINDINGS: Moderate RIGHT pleural effusion only slightly decreased from previous exam despite removal of 1.7 L of fluid. Persistent  atelectasis of the mid to lower RIGHT lung. Heart remains enlarged with postsurgical changes of CABG. Mild LEFT basilar atelectasis. Atherosclerotic calcification aorta. No pneumothorax. IMPRESSION: No pneumothorax following RIGHT thoracentesis. Persistent moderate pleural effusion despite removal of 1.7 L of fluid. Electronically Signed   By: Lavonia Dana M.D.   On: 07/25/2017 14:00   US Thoracentesis Asp Pleural Space W/img Guide  Result Date: 07/25/2017 INDICATION: Recurrent RIGHT pleural effusion EXAM: ULTRASOUND GUIDED THERAPEUTIC RIGHT THORACENTESIS MEDICATIONS: None. COMPLICATIONS: None immediate. PROCEDURE: Procedure, benefits, and risks of procedure were discussed with patient. Written informed consent for procedure was obtained. Time out protocol followed. Pleural effusion localized by ultrasound at the posterior RIGHT hemithorax. Skin prepped and draped in usual sterile fashion. Skin and soft tissues anesthetized with 10 mL of 1% lidocaine. 8 French thoracentesis catheter placed into the RIGHT pleural space. 1.7 L of dark old bloody fluid aspirated by syringe pump. Procedure tolerated well by patient without immediate complication. FINDINGS: As above IMPRESSION: Successful ultrasound guided RIGHT thoracentesis yielding 1.7 L of dark old bloody pleural fluid. Electronically Signed   By: Lavonia Dana M.D.   On: 07/25/2017 13:59    Medications:   . acidophilus  1 capsule Oral BID  . docusate sodium  100 mg Oral BID  . heparin  5,000 Units Subcutaneous Q8H  . insulin aspart  0-5 Units Subcutaneous QHS  . insulin aspart  0-9 Units Subcutaneous TID WC  . labetalol  200 mg Oral Q8H  . linaclotide  145 mcg Oral QAC breakfast  . mouth rinse  15 mL Mouth Rinse BID  . multivitamin with minerals  1 tablet Oral Daily  . omega-3 acid ethyl esters  1 g Oral BID  . pantoprazole  40 mg Oral Daily  . pravastatin  80 mg Oral q1800  . senna  2 tablet Oral Daily  . tamsulosin  0.4 mg Oral QPC supper    Continuous Infusions:   LOS: 4 days   Jacquelynn Cree  [facility] Pager [phone]. If unable to reach me by pager, please call my cell phone at [phone].  *Please refer to [email or web address], password [email or web address] to get updated schedule on who will round on this patient, as hospitalists switch teams weekly. If 7PM-7AM, please contact night-coverage at [email or web address], password [email or web address] for any overnight needs.  07/26/2017, 7:22 AM

## 2017-07-27 DIAGNOSIS — Z7189 Other specified counseling: Secondary | ICD-10-CM

## 2017-07-27 DIAGNOSIS — J910 Malignant pleural effusion: Secondary | ICD-10-CM | POA: Diagnosis present

## 2017-07-27 LAB — GLUCOSE, CAPILLARY
GLUCOSE-CAPILLARY: 184 mg/dL — AB (ref 65–99)
Glucose-Capillary: 147 mg/dL — ABNORMAL HIGH (ref 65–99)
Glucose-Capillary: 153 mg/dL — ABNORMAL HIGH (ref 65–99)
Glucose-Capillary: 148 mg/dL — ABNORMAL HIGH (ref 65–99)

## 2017-07-27 LAB — CULTURE, BODY FLUID W GRAM STAIN -BOTTLE

## 2017-07-27 LAB — CULTURE, BODY FLUID-BOTTLE: CULTURE: NO GROWTH

## 2017-07-27 NOTE — Progress Notes (Signed)
Cytology has come back from the first thoracentesis and it shows malignant cells consistent with non-small cell carcinoma.  No other studies could be done but he had another thoracentesis and potentially other studies could be done on that.  I told him about the results.  I would consider oncology consultation but he still may need to have a Pleurx catheter depending on what is decided about treatment of this malignancy.

## 2017-07-27 NOTE — Progress Notes (Signed)
Daily Progress Note   Patient Name: [name]       Date: 07/27/2017 DOB: 1934/08/17  Age: 83 y.o. MRN#: [id number] Attending Physician: Kinnie Feil, MD Primary Care Physician: [name], MD Admit Date: 07/22/2017  Reason for Consultation/Follow-up: Establishing goals of care and Psychosocial/spiritual support  Subjective: Mr. Lashley is resting quietly in bed.  He is alert and oriented, calm and cooperative.  Present today at bedside is his daughter Geanie Berlin and her husband Audry Pili, granddaughter Ronald Lobo.  Daughter Darlen Round is scheduled to arrive soon.  We talked about Mr. Ferrari "hard news" that he received this morning.  I ask if he would like to talk about what is next.  He agrees.  He states that he realizes his time is short.  As I share that only God knows, he states that he realizes he is nearing short time.  I agree.  Chaplain Joya Gaskins is present for our meeting.  As we are talking nutrition services come to take away his tray which appears relatively untouched.  I ask what foods give him pleasure, he states "a steak biscuit".  We talked about finding balance now, making sure he has pleasure in life, without going too far.  Patient and family agree that at this point he would prefer an unrestricted diet.  Orders made.  We talked about the benefits and risks of Pleurx drain.  This is explained in detail, patient and family agree that this would be a good choice for Mr. Molner.  We also talked about further workup with oncologist.  Mr. Kallman and family agrees.  Daughter Darlen Round and her husband arrived during our conversation.  We review what has already been shared.   We again talked about balance, the cost of treatment, not financial but  physical, emotional, and spiritual cost.  Enid Derry asks her father if he would be willing to take chemo if he were at stage IV cancer.  Mr. Conde states that his goal at this point is to live as long as possible.  We talked about what this may look like including nausea, sore mouth, and other side effects from chemotherapy. Finding balance, asking questions like, chemotherapy will give him months or years?  And will with that time look/feel like.  We talked about healthcare decision making,  sharing what is important with his family so that they can make choices if/when he is unable.  We talked about the benefits of Hospice if he and family decide that chemotherapy or immunotherapy is not right for them.  We talked about continuing to treat the treatable, focusing on comfort and dignity, symptom management.  I shared that this is free service at [facility].  Mr. Fildes states that his goal is to return to [facility] where he can have visitors and see his friends.  I share that I also expect recurrent pneumonia due to malignant pleural effusion, but encourage patient and family that we will continue to care for him.  We plan a family meeting for 3/28 at 10:30 AM.  Conference with chaplain Joya Gaskins related to completion of healthcare power of attorney paperwork for Mr. Macchia.  He has stated on multiple occasions to multiple people that his desire is for his granddaughter, Ronald Lobo to be his surrogate decision-maker.  Conference with Elzie Rings NP with oncology and Dr. Raliegh Ip oncologist related to plan of care, consult.  Conference with Dr. Luan Pulling related to plan of care.  Conference with interventional radiology to set up Pleurx drain.  Spoke with Caryl Pina in [employer].    Pam, PA,  will evaluate patient tomorrow.   Length of Stay: 5  Current Medications: Scheduled Meds:  . acidophilus  1 capsule Oral BID  . docusate sodium  100 mg Oral BID  . heparin  5,000 Units Subcutaneous Q8H  .  insulin aspart  0-5 Units Subcutaneous QHS  . insulin aspart  0-9 Units Subcutaneous TID WC  . labetalol  200 mg Oral Q8H  . linaclotide  145 mcg Oral QAC breakfast  . mouth rinse  15 mL Mouth Rinse BID  . multivitamin with minerals  1 tablet Oral Daily  . omega-3 acid ethyl esters  1 g Oral BID  . pantoprazole  40 mg Oral Daily  . pravastatin  80 mg Oral q1800  . senna  2 tablet Oral Daily  . tamsulosin  0.4 mg Oral QPC supper    Continuous Infusions:   PRN Meds: acetaminophen **OR** acetaminophen, HYDROcodone-acetaminophen, ondansetron **OR** ondansetron (ZOFRAN) IV, polyethylene glycol  Physical Exam  Constitutional: He is oriented to person, place, and time. No distress.  Appears chronically ill, legally blind  HENT:  Head: Atraumatic.  Cardiovascular: Normal rate.  Pulmonary/Chest: Effort normal.  Abdominal: Soft. He exhibits no distension.  Musculoskeletal: He exhibits no edema.  Neurological: He is alert and oriented to person, place, and time.  Skin: Skin is warm and dry.  Psychiatric:  Calm and cooperative  Nursing note and vitals reviewed.           Vital Signs: BP 140/66 (BP Location: Right Arm)   Pulse 81   Temp (!) 97.5 F (36.4 C) (Oral)   Resp 17   Ht 5\' 9"  (1.753 m)   Wt 84 kg (185 lb 3 oz)   SpO2 94%   BMI 27.35 kg/m  SpO2: SpO2: 94 % O2 Device: O2 Device: Room Air O2 Flow Rate: O2 Flow Rate (L/min): 2 L/min  Intake/output summary:   Intake/Output Summary (Last 24 hours) at 07/27/2017 1049 Last data filed at 07/26/2017 2021 Gross per 24 hour  Intake 240 ml  Output 400 ml  Net -160 ml   LBM: Last BM Date: 07/25/17 Baseline Weight: Weight: 83.9 kg (185 lb) Most recent weight: Weight: 84 kg (185 lb 3 oz)  Palliative Assessment/Data:    Flowsheet Rows     Most Recent Value  Intake Tab  Referral Department  Hospitalist  Unit at Time of Referral  Cardiac/Telemetry Unit  Palliative Care Primary Diagnosis  Pulmonary  Date Notified   07/25/17  Palliative Care Type  New Palliative care  Reason for referral  Clarify Goals of Care, Psychosocial or Spiritual support  Date of Admission  07/22/17  Date first seen by Palliative Care  07/26/17  # of days Palliative referral response time  1 Day(s)  # of days IP prior to Palliative referral  3  Clinical Assessment  Palliative Performance Scale Score  40%  Pain Max last 24 hours  Not able to report  Pain Min Last 24 hours  Not able to report  Dyspnea Max Last 24 Hours  Not able to report  Dyspnea Min Last 24 hours  Not able to report  Psychosocial & Spiritual Assessment  Palliative Care Outcomes  Patient/Family meeting held?  Yes  Who was at the meeting?  patient at bedside.   Patient/Family wishes: Interventions discontinued/not started   Mechanical Ventilation      Patient Active Problem List   Diagnosis Date Noted  . Palliative care by specialist   . Goals of care, counseling/discussion   . Pleural effusion on right 07/22/2017  . Pulmonary mass 07/22/2017  . Lung nodules 07/22/2017  . Recurrent right pleural effusion 07/22/2017  . UTI (urinary tract infection) 07/22/2017  . DM (diabetes mellitus) with peripheral vascular complication ([place]) 67/89/3810  . Anemia due to chronic blood loss 03/26/2015  . Constipation 03/06/2015  . AKI (acute kidney injury) ([place]) 03/06/2015  . Elevated troponin 03/06/2015  . Iron deficiency anemia 04/09/2013  . COPD (chronic obstructive pulmonary disease) ([place]) 11/18/2009  . HLD (hyperlipidemia) 11/18/2009  . Hypertension 11/18/2009  . INTERMEDIATE CORONARY SYNDROME 11/18/2009  . Coronary atherosclerosis 11/18/2009  . PVD 11/18/2009  . History of cardioembolic cerebrovascular accident (CVA) 11/18/2009    Palliative Care Assessment & Plan   Patient Profile: 83 y.o. male  with past medical history of HTN, DM 2, CAD,CABG 2000, CVA w/ left weakness/dysphagia, COPD, blindness, recent pneumonia status post treatment with Levaquin,  and recent UTI, SNF resident for around 2 years, admitted on 07/22/2017 with Pleural effusion with possible pleural based mass on right.   Assessment: Pleural effusion with pleural based mass on right, cytology report shows malignant cells consistent with non-cell carcinoma: Patient and family are agreeable to investigate Pleurx drain for comfort.  Also agreeable for oncology consult to cover options for possible treatment.  Recommendations/Plan: Pleurx drain for comfort.   Oncology consult to cover options for possible treatment.  Goals of Care and Additional Recommendations:  Limitations on Scope of Treatment: Continue to treat the treatable but no extraordinary measures such as CPR or intubation.  Code Status:    Code Status Orders  (From admission, onward)        Start     Ordered   07/22/17 2115  Do not attempt resuscitation (DNR)  Continuous    Question Answer Comment  In the event of cardiac or respiratory ARREST Do not call a "code blue"   In the event of cardiac or respiratory ARREST Do not perform Intubation, CPR, defibrillation or ACLS   In the event of cardiac or respiratory ARREST Use medication by any route, position, wound care, and other measures to relive pain and suffering. May use oxygen, suction and manual treatment of airway obstruction as needed for  comfort.      07/22/17 2114    Code Status History    Date Active Date Inactive Code Status Order ID Comments User Context   07/22/2017 2045 07/22/2017 2114 Full Code [id number]  Vianne Bulls, MD ED   03/27/2015 2320 03/28/2015 2112 Full Code [id number]  Leone Brand, MD Inpatient   03/06/2015 0114 03/10/2015 2100 Full Code [id number]  Ivor Costa, MD ED    Advance Directive Documentation     Most Recent Value  Type of Advance Directive  Out of facility DNR (pink MOST or yellow form)  Pre-existing out of facility DNR order (yellow form or pink MOST form)  -  "MOST" Form in  Place?  -       Prognosis:   < 6  months or less would not be surprising, based on current health status, and likely lung cancer.  Discharge Planning:  Return to the [facility], where he is a resident, when stable.  Care plan was discussed with nursing staff, social worker, Dr. Luan Pulling, oncologist, chaplain Joya Gaskins, Dr. Jacinto Reap.  Thank you for allowing the Palliative Medicine Team to assist in the care of this patient.   Time In: 0910 Time Out: 1100 Total Time 110 minutes Prolonged Time Billed  yes       Greater than 50%  of this time was spent counseling and coordinating care related to the above assessment and plan.  Drue Novel, NP  Please contact Palliative Medicine Team phone at [phone] for questions and concerns.

## 2017-07-27 NOTE — Care Management Important Message (Signed)
Important Message  Patient Details  Name: [name] MRN: [id number] Date of Birth: December 29, 1934   Medicare Important Message Given:  Yes    Shelda Altes 07/27/2017, 11:23 AM

## 2017-07-27 NOTE — Progress Notes (Signed)
Patient with non-small cell carcinoma s/p thoracentesis x2 in the past 1 week. IR aware of request for PleurX drain.   Patient to be assessed tomorrow for possible placement on Friday as schedule allows.   Brynda Greathouse, MS RD PA-C 2:20 PM

## 2017-07-27 NOTE — Progress Notes (Signed)
[facility] PROGRESS NOTE  [name] ZJQ:[id number] DOB: [birth year] DOA: 07/22/2017 PCP: [name], MD  Brief summary   83 y.o. male with a PMH of hypertension, type 2 diabetes, CAD, tobacco abuse,recent pneumonia status post treatment with Levaquin and recent UTI status post treatment with Augmentin who was admitted 07/22/17 for evaluation of malaise, anorexia, shortness of breath and cough. Initial workup revealed a large right pleural effusion with a possible pleural-based mass. Had a 1 L paracentesis on admission with a post thoracentesis x-ray showing persistent large right pleural effusion. Pulmonology onboard. Underwent thoracentesis, Cytology is positive for malignancy   Assessment/Plan:  Pleural effusion with possible pleural based mass on right. Status post 1 L thoracentesis 07/22/17. Per pulmonology: Cytology malignant cells consistent with non-small cell carcinoma. Follow-up chest x-ray shows persistent large pleural effusion. Pulmonology onboard. Repeat thoracentesis done 07/25/17 with another 1.7 L of dark red colored pleuritic fluid removed.  Postprocedure x-ray showed a persistent pleural effusion. Given likelihood of poor prognosis, palliative care has been consulted. awairting oncology eval, possible need pleurex catheter. Family meeting tomorrow at 10.00. appreciate pulmonology, palliative care input    COPD (chronic obstructive pulmonary disease) ([place]) Appears to be stable. Oxygen saturations 95-97 percent.  Hypertension Continue labetalol. Blood pressure reasonably well-controlled.  Coronary atherosclerosis Continue statin and Lovaza.  AKI (acute kidney injury) (HCC)Renal ultrasound negative for hydronephrosis.Creatinine 2.67 on admission, now WNL.Likely prerenal in etiology.  Elevated troponinMild with trend flat. No reports of chest pain. No EKG changes noted. Aspirin/Plavix on hold pending thoracentesis. Will continue to hold pending definition of further  treatment options.  DM (diabetes mellitus) with peripheral vascular complication (HCC)Recent hemoglobin A1c 7.3%.Metformin remains on hold. Currently on insulin sensitive SSI every Q AC/HS. CBGs 98-132.   Iron deficiency anemiaHemoglobin stable.  Proteus UTIStatus post treatment course with Augmentin.  LeukocytosisPleural fluid culture from 07/22/17 negative.  Possibly inflammatory/reactive.  Body mass index is 27.35 kg/m.    Code Status: DNR Family Communication: d/w patient, his family, rn (indicate person spoken with, relationship, and if by phone, the number) Disposition Plan: pend oncology eval, further discussions regarding pleural effusion    Consultants:  Oncology   Pulmonology   Procedures:  thoracentesis   Antibiotics: Anti-infectives (From admission, onward)   Start     Dose/Rate Route Frequency Ordered Stop   07/22/17 2200  amoxicillin-clavulanate (AUGMENTIN) 500-125 MG per tablet 500 mg  Status:  Discontinued     1 tablet Oral 2 times daily 07/22/17 2045 07/24/17 1906        (indicate start date, and stop date if known)  HPI/Subjective: Family at the bedside. Patient denies acute pains, comfortable. Informed regarding malignancy test results   Objective: Vitals:   07/26/17 2049 07/27/17 0542  BP:  140/66  Pulse:  81  Resp:    Temp:  (!) 97.5 F (36.4 C)  SpO2: (!) 88% 94%    Intake/Output Summary (Last 24 hours) at 07/27/2017 1119 Last data filed at 07/26/2017 2021 Gross per 24 hour  Intake 240 ml  Output 400 ml  Net -160 ml   Filed Weights   07/22/17 1443 07/22/17 2100  Weight: 83.9 kg (185 lb) 84 kg (185 lb 3 oz)    Exam:   General:  No distress   Cardiovascular: s1,s2 rrr  Respiratory: no wheezing   Abdomen: soft, nt, nd   Musculoskeletal: no leg edema    Data Reviewed: Basic Metabolic Panel: Recent Labs  Lab 07/22/17 1506 07/23/17 0617 07/24/17 0927 07/25/17  0973 07/26/17 0647  NA 139 142 140 142 142  K 4.3  4.4 4.1 4.1 4.2  CL 108 111 112* 112* 111  CO2 17* 18* 18* 19* 20*  GLUCOSE 196* 108* 157* 96 124*  BUN 58* 57* 39* 27* 20  CREATININE 2.97* 2.05* 1.12 0.97 0.93  CALCIUM 8.8* 8.7* 8.5* 8.5* 8.7*   Liver Function Tests: Recent Labs  Lab 07/22/17 1240 07/22/17 1506  AST 17 15  ALT 11* 12*  ALKPHOS 58 54  BILITOT 0.6 0.6  PROT 7.4 7.6  ALBUMIN 3.2* 3.2*   No results for input(s): LIPASE, AMYLASE in the last 168 hours. No results for input(s): AMMONIA in the last 168 hours. CBC: Recent Labs  Lab 07/22/17 1240 07/22/17 1506 07/23/17 0617 07/24/17 0927 07/25/17 0513 07/26/17 0647  WBC 18.3* 18.5* 16.9* 17.0* 17.6* 19.3*  NEUTROABS 14.2* 14.5* 12.3*  --   --   --   HGB 12.0* 11.1* 12.2* 11.4* 11.6* 12.7*  HCT 36.6* 35.0* 37.5* 35.0* 36.3* 39.3  MCV 82.1 83.1 83.1 82.4 82.9 83.1  PLT 417* 422* 421* 405* 445* 457*   Cardiac Enzymes: Recent Labs  Lab 07/22/17 1507 07/22/17 2243 07/23/17 0617 07/23/17 1054  TROPONINI 0.03* 0.03* 0.03* <0.03   BNP (last 3 results) Recent Labs    07/22/17 1507  BNP 79.0    ProBNP (last 3 results) No results for input(s): PROBNP in the last 8760 hours.  CBG: Recent Labs  Lab 07/26/17 0836 07/26/17 1147 07/26/17 1821 07/26/17 2147 07/27/17 0754  GLUCAP 109* 170* 170* 132* 147*    Recent Results (from the past 240 hour(s))  Gram stain     Status: None   Collection Time: 07/22/17  4:20 PM  Result Value Ref Range Status   Specimen Description THORACENTESIS  Final   Special Requests NONE  Final   Gram Stain   Final    CYTOSPIN SMEAR NO ORGANISMS SEEN WBC PRESENT,BOTH PMN AND MONONUCLEAR Performed at [facility], [address], [place], [place] [address]    Report Status 07/22/2017 FINAL  Final  Acid Fast Smear (AFB)     Status: None   Collection Time: 07/22/17  4:20 PM  Result Value Ref Range Status   AFB Specimen Processing Concentration  Final   Acid Fast Smear Negative  Final    Comment: (NOTE) Performed At: [facility] [address] [place], [place] [id number] Rush Farmer MD QQ:[id number]    Source (AFB) PLEURAL  Final    Comment: Performed at [facility], [address] [place], [place] [address]  Culture, body fluid-bottle     Status: None   Collection Time: 07/22/17  4:20 PM  Result Value Ref Range Status   Specimen Description PLEURAL  Final   Special Requests BOTTLES DRAWN AEROBIC AND ANAEROBIC 10CC  Final   Culture   Final    NO GROWTH 5 DAYS Performed at [facility], [address] [place], [place] [address]    Report Status 07/27/2017 FINAL  Final  MRSA PCR Screening     Status: None   Collection Time: 07/22/17  9:22 PM  Result Value Ref Range Status   MRSA by PCR NEGATIVE NEGATIVE Final    Comment:        The GeneXpert MRSA Assay (FDA approved for NASAL specimens only), is one component of a comprehensive MRSA colonization surveillance program. It is not intended to diagnose MRSA infection nor to guide or monitor treatment for MRSA infections. Performed at [facility], [address] [place], [place] [address]   Urine culture     Status: None   Collection Time: 07/23/17 12:17 AM  Result Value Ref Range Status   Specimen Description   Final    URINE, CLEAN CATCH Performed at [facility], [address], [place], [place] [address]    Special Requests   Final    NONE Performed at [facility], [address] [place], [place] [address]    Culture   Final    NO GROWTH Performed at [facility], [place] [address], [place],  [address]    Report Status 07/24/2017 FINAL  Final     Studies: Dg Chest 1 View  Result Date: 07/25/2017 CLINICAL DATA:  RIGHT pleural effusion post thoracentesis EXAM: CHEST  1 VIEW COMPARISON:  07/22/2017 FINDINGS: Moderate RIGHT pleural effusion only slightly decreased from previous exam despite removal of 1.7 L of fluid. Persistent atelectasis of the mid to lower RIGHT lung. Heart remains enlarged with postsurgical  changes of CABG. Mild LEFT basilar atelectasis. Atherosclerotic calcification aorta. No pneumothorax. IMPRESSION: No pneumothorax following RIGHT thoracentesis. Persistent moderate pleural effusion despite removal of 1.7 L of fluid. Electronically Signed   By: Lavonia Dana M.D.   On: 07/25/2017 14:00   US Thoracentesis Asp Pleural Space W/img Guide  Result Date: 07/25/2017 INDICATION: Recurrent RIGHT pleural effusion EXAM: ULTRASOUND GUIDED THERAPEUTIC RIGHT THORACENTESIS MEDICATIONS: None. COMPLICATIONS: None immediate. PROCEDURE: Procedure, benefits, and risks of procedure were discussed with patient. Written informed consent for procedure was obtained. Time out protocol followed. Pleural effusion localized by ultrasound at the posterior RIGHT hemithorax. Skin prepped and draped in usual sterile fashion. Skin and soft tissues anesthetized with 10 mL of 1% lidocaine. 8 French thoracentesis catheter placed into the RIGHT pleural space. 1.7 L of dark old bloody fluid aspirated by syringe pump. Procedure tolerated well by patient without immediate complication. FINDINGS: As above IMPRESSION: Successful ultrasound guided RIGHT thoracentesis yielding 1.7 L of dark old bloody pleural fluid. Electronically Signed   By: Lavonia Dana M.D.   On: 07/25/2017 13:59    Scheduled Meds: . acidophilus  1 capsule Oral BID  . docusate sodium  100 mg Oral BID  . heparin  5,000 Units Subcutaneous Q8H  . insulin aspart  0-5 Units Subcutaneous QHS  . insulin aspart  0-9 Units Subcutaneous TID WC  . labetalol  200 mg Oral Q8H  . linaclotide  145 mcg Oral QAC breakfast  . mouth rinse  15 mL Mouth Rinse BID  . multivitamin with minerals  1 tablet Oral Daily  . omega-3 acid ethyl esters  1 g Oral BID  . pantoprazole  40 mg Oral Daily  . pravastatin  80 mg Oral q1800  . senna  2 tablet Oral Daily  . tamsulosin  0.4 mg Oral QPC supper   Continuous Infusions:  Principal Problem:   Pleural effusion on right Active  Problems:   COPD (chronic obstructive pulmonary disease) (HCC)   Hypertension   Coronary atherosclerosis   AKI (acute kidney injury) (HCC)   Elevated troponin   DM (diabetes mellitus) with peripheral vascular complication (HCC)   Iron deficiency anemia   Pulmonary mass   Lung nodules   Recurrent right pleural effusion   UTI (urinary tract infection)   Palliative care by specialist   Goals of care, counseling/discussion    Time spent: >35 minutes     Kinnie Feil  [facility] Pager [phone]. If 7PM-7AM, please contact night-coverage  at [email or web address], password [facility] 07/27/2017, 11:19 AM  LOS: 5 days

## 2017-07-28 DIAGNOSIS — J90 Pleural effusion, not elsewhere classified: Secondary | ICD-10-CM

## 2017-07-28 LAB — GLUCOSE, CAPILLARY
GLUCOSE-CAPILLARY: 136 mg/dL — AB (ref 65–99)
GLUCOSE-CAPILLARY: 165 mg/dL — AB (ref 65–99)
Glucose-Capillary: 163 mg/dL — ABNORMAL HIGH (ref 65–99)
Glucose-Capillary: 150 mg/dL — ABNORMAL HIGH (ref 65–99)

## 2017-07-28 LAB — PROTIME-INR
INR: 1.17
PROTHROMBIN TIME: 14.8 s (ref 11.4–15.2)

## 2017-07-28 MED ORDER — HEPARIN SODIUM (PORCINE) 5000 UNIT/ML IJ SOLN
5000.0000 [IU] | Freq: Three times a day (TID) | INTRAMUSCULAR | Status: DC
  Administered 2017-07-30 (×2): 5000 [IU] via SUBCUTANEOUS
  Filled 2017-07-28 (×2): qty 1

## 2017-07-28 NOTE — Consult Note (Signed)
Chief Complaint: Patient was seen in consultation today for right PleurX catheter placement Chief Complaint  Patient presents with  . Abnormal Lab   at the request of Dr Velvet Bathe  Supervising Physician: Marybelle Killings  Patient Status: [facility] IP  History of Present Illness: [name] is a 83 y.o. male   Hx HTN; CAD Recent Pna; + Smoker SOB; Cough and presented to ED  CT 3/22: Large RIGHT pleural effusion with complete atelectasis of the RIGHT middle and RIGHT lower lobes and partial atelectasis of the RIGHT upper lobe. 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 BILATERAL lung nodules Extensive atherosclerotic calcifications including coronary  3/22 1 L  Right Thora 3/25: 1.7 L  Right thora  MALIGNANT CELLS PRESENT, CONSISTENT WITH NON SMALL CELL CARCINOMA.  Recurrent right malignant effusion Request made for right PleurX catheter placement Dr Barbie Banner has reviewed imaging and approves procedure  Plan for 3/29 - [facility]    Past Medical History:  Diagnosis Date  . Anemia due to chronic blood loss   . CAD (coronary artery disease)    cath 11/2007: 70% LM ISR, SVG-LAD OK, SVG-D1 100%, IMA-D2 OK, SVG-OM OK, 4-vessel CABG 2000.  Marland Kitchen CAD (coronary artery disease)    s/p of 95% L main artery stenosis, 4/03: cutting balloon PTCA of 80% in-stent restenosis, 7/04. preserved L ventriculr function  . Carotid bruit    bilateral. no sigificant distal abd atherosclerosis by recent cath.   . Chronic bronchitis ([place])   . Constipation   . COPD (chronic obstructive pulmonary disease) (HCC)    ongoing tobacco   . CVA (cerebral infarction)    left sided weakness  . DM2 (diabetes mellitus, type 2) ([place])   . Dyslipidemia   . Dysphagia   . Glaucoma   . HTN (hypertension)     Past Surgical History:  Procedure Laterality Date  . CARDIAC CATHETERIZATION    . COLONOSCOPY N/A 05/14/2015   POOR PREP  . COLONOSCOPY N/A  06/02/2015   SLF: 1. four colorectal polyps removed. no source for anemia identified. 2. the left colon is redundant 3. moderate sized internal hemorroids.   . ESOPHAGOGASTRODUODENOSCOPY N/A 05/14/2015   CHRONIC GASTRITIS  . GIVENS CAPSULE STUDY N/A 06/17/2015   Procedure: GIVENS CAPSULE STUDY;  Surgeon: Danie Binder, MD;  Location: [facility];  Service: Endoscopy;  Laterality: N/A;  0800    Allergies: Patient has no known allergies.  Medications: Prior to Admission medications   Medication Sig Start Date End Date Taking? Authorizing Provider  amLODipine (NORVASC) 5 MG tablet Take 5 mg by mouth daily.   Yes [provider]  amoxicillin-clavulanate (AUGMENTIN) 500-125 MG tablet Take 1 tablet by mouth 2 (two) times daily. 07/16/17 07/24/18 Yes [provider]  Calcium Carbonate-Vitamin D (OSCAL 500/200 D-3 PO) 1 Tablet once a day by mouth . Can't be given with iron   Yes [provider]  clopidogrel (PLAVIX) 75 MG tablet Take 75 mg by mouth daily.   Yes [provider]  docusate sodium (COLACE) 100 MG capsule Take 100 mg by mouth 2 (two) times daily.   Yes [provider]  ferrous sulfate (QC FERROUS SULFATE) 325 (65 FE) MG tablet Take 325 mg by mouth daily.    Yes [provider]  labetalol (NORMODYNE) 200 MG tablet Take 200 mg by mouth 3 (three) times daily. Reported on 10/22/2015   Yes [provider]  linaclotide (LINZESS) 145 MCG CAPS capsule Take 145 mcg by mouth daily before breakfast.   Yes [provider]  lisinopril (PRINIVIL,ZESTRIL) 40 MG tablet Take 40 mg by mouth daily.   Yes [provider]  metFORMIN (GLUCOPHAGE) 500 MG tablet Take 500 mg by mouth 2 (two) times daily with a meal.    Yes [provider]  Multiple Vitamin (MULTIVITAMIN) tablet Take 1 tablet by mouth daily.   Yes [provider]  Omega-3 Fatty Acids (FISH OIL) 1000 MG CAPS Take 1,000 mg by mouth 3 (three) times daily.    Yes [provider]  pantoprazole (PROTONIX) 40 MG tablet Take 40 mg by mouth daily.   Yes [provider]  pravastatin (PRAVACHOL) 80 MG tablet Take 80 mg by mouth daily.   Yes [provider]  Probiotic Product (RISA-BID PROBIOTIC) TABS Take 1 tablet by mouth twice a day from //-//   Yes [provider]  senna (SENOKOT) 8.6 MG TABS tablet Take 2 tablets (17.2 mg total) by mouth daily. 03/10/15  Yes Short, Noah Delaine, MD  tamsulosin (FLOMAX) 0.4 MG CAPS capsule Take 0.4 mg by mouth daily after supper.   Yes [provider]     Family History  Problem Relation Age of Onset  . Hypertension Mother   . Stroke Maternal Grandmother   . Cancer Neg Hx   . Diabetes Neg Hx   . Heart disease Neg Hx     Social History   Socioeconomic History  . Marital status: Married    Spouse name: Not on file  . Number of children: Not on file  . Years of education: Not on file  . Highest education level: Not on file  Occupational History  . Not on file  Social Needs  . Financial resource strain: Not on file  . Food insecurity:    Worry: Not on file    Inability: Not on file  . Transportation needs:    Medical: Not on file    Non-medical: Not on file  Tobacco Use  . Smoking status: Former Smoker    Last attempt to quit: 08/03/2014    Years since quitting: 2.9  . Smokeless tobacco: Never Used  . Tobacco comment: 50 pack year hx ; not smoking @ [facility]  Substance and Sexual Activity  . Alcohol use: No    Alcohol/week: 0.0 oz    Comment: has not had alcohol in 30-40 years   . Drug use: No  . Sexual activity: Not on file  Lifestyle  . Physical activity:    Days per week: Not on file    Minutes per session: Not on file  . Stress: Not on file  Relationships  . Social connections:    Talks on phone: Not on file    Gets together: Not on file    Attends religious service: Not on file    Active member of club or organization: Not on file    Attends  meetings of clubs or organizations: Not on file    Relationship status: Not on file  Other Topics Concern  . Not on file  Social History Narrative  . Not on file    Review of Systems: A 12 point ROS discussed and pertinent positives are indicated in the HPI above.  All other systems are negative.  Review of Systems  Constitutional: Positive for activity change and fatigue. Negative for fever and unexpected weight change.  Respiratory: Positive for cough, shortness of breath  and wheezing.   Neurological: Positive for weakness.  Psychiatric/Behavioral: Negative for behavioral problems and confusion.    Vital Signs: BP (!) 150/76 (BP Location: Right Arm)   Pulse 83   Temp 98.5 F (36.9 C) (Axillary)   Resp 20   Ht 5\' 9"  (1.753 m)   Wt 185 lb 3 oz (84 kg)   SpO2 99%   BMI 27.35 kg/m   Physical Exam  Constitutional: He is oriented to person, place, and time.  Eyes:  Blind  Cardiovascular: Normal rate and regular rhythm.  Pulmonary/Chest: Effort normal. No respiratory distress. He has wheezes.  Abdominal: Soft. Bowel sounds are normal.  Musculoskeletal: Normal range of motion.  Neurological: He is alert and oriented to person, place, and time.  Skin: Skin is warm and dry.  Psychiatric: He has a normal mood and affect. His behavior is normal.  Consented with Dtr Neoma Laming via phone  Nursing note and vitals reviewed.   Imaging: Dg Chest 1 View  Result Date: 07/25/2017 CLINICAL DATA:  RIGHT pleural effusion post thoracentesis EXAM: CHEST  1 VIEW COMPARISON:  07/22/2017 FINDINGS: Moderate RIGHT pleural effusion only slightly decreased from previous exam despite removal of 1.7 L of fluid. Persistent atelectasis of the mid to lower RIGHT lung. Heart remains enlarged with postsurgical changes of CABG. Mild LEFT basilar atelectasis. Atherosclerotic calcification aorta. No pneumothorax. IMPRESSION: No pneumothorax following RIGHT thoracentesis. Persistent moderate pleural effusion  despite removal of 1.7 L of fluid. Electronically Signed   By: Lavonia Dana M.D.   On: 07/25/2017 14:00   Dg Chest 1 View  Result Date: 07/22/2017 CLINICAL DATA:  RIGHT pleural effusion post thoracentesis with removal of 1.05 L of fluid from the RIGHT chest EXAM: CHEST  1 VIEW COMPARISON:  Earlier study of 07/22/2017 FINDINGS: Enlargement of cardiac silhouette post CABG. Atherosclerotic calcification aorta. Persistent large RIGHT pleural effusion with significant atelectasis of the mid to lower RIGHT lung. Minimal LEFT basilar atelectasis. No pneumothorax. Osseous structures unremarkable. IMPRESSION: Persistent large RIGHT pleural effusion and significant atelectasis of the mid to lower RIGHT lung despite thoracentesis. No pneumothorax. Electronically Signed   By: Lavonia Dana M.D.   On: 07/22/2017 16:55   Dg Chest 2 View  Result Date: 07/22/2017 CLINICAL DATA:  Acutely elevated BUN and white blood cell count. Headache today. EXAM: CHEST - 2 VIEW COMPARISON:  Single-view of the chest 04/05/2015. FINDINGS: The patient has a large right pleural effusion with extensive airspace disease. The left lung is clear. There is cardiomegaly. The patient is status post CABG. Atherosclerosis is noted. IMPRESSION: Large right pleural effusion and associated airspace disease which cannot be definitively characterized. Cardiomegaly. Electronically Signed   By: Inge Rise M.D.   On: 07/22/2017 15:26   Ct Chest Wo Contrast  Result Date: 07/22/2017 CLINICAL DATA:  RIGHT pleural effusion EXAM: CT CHEST WITHOUT CONTRAST TECHNIQUE: Multidetector CT imaging of the chest was performed following the standard protocol without IV contrast. Sagittal and coronal MPR images reconstructed from axial data set. IV contrast not utilized due to renal dysfunction. COMPARISON:  Chest radiograph 07/22/2017 FINDINGS: Cardiovascular: Atherosclerotic calcifications aorta, proximal great vessels and coronary arteries. Aorta normal caliber. No  pericardial effusion. Mediastinum/Nodes: Esophagus unremarkable. Base of cervical region normal appearance. Enlarged precarinal lymph node 15 mm short axis image 55. Additional scattered normal sized mediastinal nodes. Hilar assessment limited by lack of IV contrast but RIGHT hilum appears slightly enlarged adenopathy questioned. Lungs/Pleura: Large RIGHT pleural effusion. Complete atelectasis of RIGHT lower lobe. Complete atelectasis of  RIGHT middle lobe. Partial atelectasis of RIGHT upper lobe. Abnormal soft tissue density at the anteromedial mid RIGHT chest highly suspicious for pleural based tumor. This measures up to 5.1 x 3.5 cm image 67 extends approximately 6.1 cm craniocaudal length. This abuts both the anterior chest wall and the mediastinum. Questionable area of minimal pleural thickening at the inferomedial RIGHT hemithorax. 9 mm LEFT mid lung nodule at major fissure image 73. Question additional midlung nodule 6 mm diameter image 70. Tiny calcified granuloma LEFT upper lobe. Question additional 5 mm LEFT upper lobe nodule. Tiny RIGHT upper lobe pulmonary nodules sagittal images 48 and 54. Upper Abdomen: Unremarkable prior median sternotomy. Musculoskeletal: No acute osseous findings. IMPRESSION: Large RIGHT pleural effusion with complete atelectasis of the RIGHT middle and RIGHT lower lobes and partial atelectasis of the RIGHT upper lobe. 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 BILATERAL lung nodules. a Extensive atherosclerotic calcifications including coronary arteries. Aortic Atherosclerosis (ICD10-I70.0). Electronically Signed   By: Lavonia Dana M.D.   On: 07/22/2017 19:09   US Abdomen Complete  Result Date: 07/22/2017 CLINICAL DATA:  Elevated BUN and creatinine, does not feel good, RIGHT pleural effusion by chest radiograph EXAM: ABDOMEN ULTRASOUND COMPLETE COMPARISON:  CT abdomen and pelvis 02/02/2015 FINDINGS: Gallbladder: Shadowing  gallstone 13 mm diameter within gallbladder. No gallbladder wall thickening, pericholecystic fluid or sonographic Murphy sign. Common bile duct: Diameter: Normal caliber 3 mm diameter Liver: Upper normal hepatic echogenicity. No discrete hepatic mass lesion. Portal vein is patent on color Doppler imaging with normal direction of blood flow towards the liver. IVC: Normal appearance Pancreas: Visualized portion of pancreatic body and proximal tail normal appearance, remainder obscured by bowel gas Spleen: Inadequately visualized due to high subcostal position Right Kidney: Length: 10.9 cm. Normal morphology without mass or hydronephrosis. Left Kidney: Length: 10.6 cm. Normal morphology without mass or hydronephrosis. Abdominal aorta: Normal caliber at proximal and mid portions, bifurcation obscured by bowel gas Other findings: No free fluid IMPRESSION: Cholelithiasis without evidence acute cholecystitis. Incomplete visualization of pancreas, spleen, and distal aorta. Electronically Signed   By: Lavonia Dana M.D.   On: 07/22/2017 16:39   US Thoracentesis Asp Pleural Space W/img Guide  Result Date: 07/25/2017 INDICATION: Recurrent RIGHT pleural effusion EXAM: ULTRASOUND GUIDED THERAPEUTIC RIGHT THORACENTESIS MEDICATIONS: None. COMPLICATIONS: None immediate. PROCEDURE: Procedure, benefits, and risks of procedure were discussed with patient. Written informed consent for procedure was obtained. Time out protocol followed. Pleural effusion localized by ultrasound at the posterior RIGHT hemithorax. Skin prepped and draped in usual sterile fashion. Skin and soft tissues anesthetized with 10 mL of 1% lidocaine. 8 French thoracentesis catheter placed into the RIGHT pleural space. 1.7 L of dark old bloody fluid aspirated by syringe pump. Procedure tolerated well by patient without immediate complication. FINDINGS: As above IMPRESSION: Successful ultrasound guided RIGHT thoracentesis yielding 1.7 L of dark old bloody pleural  fluid. Electronically Signed   By: Lavonia Dana M.D.   On: 07/25/2017 13:59   US Thoracentesis Asp Pleural Space W/img Guide  Result Date: 07/22/2017 INDICATION: Large RIGHT pleural effusion EXAM: ULTRASOUND GUIDED DIAGNOSTIC AND THERAPEUTIC RIGHT THORACENTESIS MEDICATIONS: None COMPLICATIONS: None immediate. PROCEDURE: Procedure, benefits, and risks of procedure were discussed with patient. Written informed consent for procedure was obtained. Time out protocol followed. Pleural effusion localized by ultrasound at the posterior RIGHT hemithorax. Skin prepped and draped in usual sterile fashion. Skin and soft tissues anesthetized with 10 mL of 1%  lidocaine. 8 French thoracentesis catheter placed into the RIGHT pleural space. 1.05 L of dark old bloody fluid aspirated by syringe and vacuum bottle suction. Procedure tolerated well by patient without immediate complication. FINDINGS: A total of approximately 1.05 L of RIGHT pleural fluid was removed. Samples were sent to the laboratory as requested by the clinical team. IMPRESSION: Successful ultrasound guided RIGHT thoracentesis yielding 1.05 L of pleural fluid. Electronically Signed   By: Lavonia Dana M.D.   On: 07/22/2017 16:46    Labs:  CBC: Recent Labs    07/23/17 0617 07/24/17 0927 07/25/17 0513 07/26/17 0647  WBC 16.9* 17.0* 17.6* 19.3*  HGB 12.2* 11.4* 11.6* 12.7*  HCT 37.5* 35.0* 36.3* 39.3  PLT 421* 405* 445* 457*    COAGS: No results for input(s): INR, APTT in the last 8760 hours.  BMP: Recent Labs    07/23/17 0617 07/24/17 0927 07/25/17 0513 07/26/17 0647  NA 142 140 142 142  K 4.4 4.1 4.1 4.2  CL 111 112* 112* 111  CO2 18* 18* 19* 20*  GLUCOSE 108* 157* 96 124*  BUN 57* 39* 27* 20  CALCIUM 8.7* 8.5* 8.5* 8.7*  CREATININE 2.05* 1.12 0.97 0.93  GFRNONAA 29* >60 >60 >60  GFRAA 34* >60 >60 >60    LIVER FUNCTION TESTS: Recent Labs    07/05/17 0704 07/12/17 1420 07/22/17 1240 07/22/17 1506  BILITOT 0.3 0.4 0.6 0.6    AST 13* 16 17 15   ALT 10* 9* 11* 12*  ALKPHOS 62 62 58 54  PROT 7.0 6.8 7.4 7.6  ALBUMIN 3.2* 3.2* 3.2* 3.2*    TUMOR MARKERS: No results for input(s): AFPTM, CEA, CA199, CHROMGRNA in the last 8760 hours.  Assessment and Plan:  + malignant right pleural effusion Rapid re accumulation Scheduled for Right PleurX catheter placement Pt and family are aware of procedure benefits and risks Including but not limited to Infection; bleeding; Pneumothorax They are agreeable to proceed Consent signed andin chart   Thank you for this interesting consult.  I greatly enjoyed meeting URBANO MILHOUSE and look forward to participating in their care.  A copy of this report was sent to the requesting provider on this date.  Electronically Signed: Lavonia Drafts, PA-C 07/28/2017, 10:16 AM   I spent a total of 40 Minutes    in face to face in clinical consultation, greater than 50% of which was counseling/coordinating care for right PleurX catheter placement

## 2017-07-28 NOTE — Consult Note (Signed)
Diagnosis Pleural effusion on right  Dyspnea - Plan: US THORACENTESIS ASP PLEURAL SPACE W/IMG GUIDE, US THORACENTESIS ASP PLEURAL SPACE W/IMG GUIDE  S/P thoracentesis - Plan: DG Chest 1 View, DG Chest 1 View  AKI (acute kidney injury) ([place])  Pulmonary mass  Hypoxia - Plan: US THORACENTESIS ASP PLEURAL SPACE W/IMG GUIDE, US THORACENTESIS ASP PLEURAL SPACE W/IMG GUIDE  Status post thoracentesis - Plan: DG Chest 1 View, DG Chest 1 View  Malignant pleural effusion - Plan: IR Guided Drain W Catheter Placement, IR Guided Drain W Catheter Placement  Staging Cancer Staging No matching staging information was found for the patient.  Assessment and plan:  1.  Advanced stage NSCLC.  Pt has a malignant right Pleural effusion.  He has undergone evaluation with IR for Pleurx catheter placement.  I have discussed with the family that the patient has overall poor performance status.  He is also showing evidence of advanced stage lung cancer.  If they desired consideration for chemotherapy he would have to be able to come back and forth to clinic for appointments and treatment.  Based on poor performance status and comorbidities he would likely be more appropriate for hospice referral.  Discussed this with admitting team.  2.  Malignant pleural effusion.  He has been seen by IR and is scheduled for Pleurx catheter placement.  3.  Hypertension.  Blood pressure is 150/76.  He is followed by primary admitting team.  4.  Leukocytosis.  Likely reactive.  Patient is afebrile.  5.  Renal insufficiency.  Creatinine is 2.  HPI: 83 year old male who has been in a nursing home for 3 years.  He has a long smoking history.  He had a CT of the chest done on 07/22/2017 that showed a large right pleural effusion with complete atelectasis of the right middle lobe and right lower lobe and partial atelectasis of the right upper lobe.  There was a probable pleural-based tumor at the anterior mid chest measuring 5.1 x 3.5  x 6.1 cm.  There were bilateral lung nodules.  There was extensive atherosclerosis.  He underwent a right thoracentesis on 07/25/2017 that showed pathology  consistent with non-small cell.  He is reportedly planned for Pleurx catheter placement.  The patient has a poor performance status and is primarily in bed more than 50% of the time.  Family does desire discussion on possible treatment options in this patient with advanced stage lung cancer.  Upon evaluation family at bedside.    Past Medical History Past Medical History:  Diagnosis Date  . Anemia due to chronic blood loss   . CAD (coronary artery disease)    cath 11/2007: 70% LM ISR, SVG-LAD OK, SVG-D1 100%, IMA-D2 OK, SVG-OM OK, 4-vessel CABG 2000.  Marland Kitchen CAD (coronary artery disease)    s/p of 95% L main artery stenosis, 4/03: cutting balloon PTCA of 80% in-stent restenosis, 7/04. preserved L ventriculr function  . Carotid bruit    bilateral. no sigificant distal abd atherosclerosis by recent cath.   . Chronic bronchitis ([place])   . Constipation   . COPD (chronic obstructive pulmonary disease) ([facility])    ongoing tobacco   . CVA (cerebral infarction)    left sided weakness  . DM2 (diabetes mellitus, type 2) ([place])   . Dyslipidemia   . Dysphagia   . Glaucoma   . HTN (hypertension)     Past Surgical History Past Surgical History:  Procedure Laterality Date  . CARDIAC CATHETERIZATION    . COLONOSCOPY N/A  05/14/2015   POOR PREP  . COLONOSCOPY N/A 06/02/2015   [facility]: 1. four colorectal polyps removed. no source for anemia identified. 2. the left colon is redundant 3. moderate sized internal hemorroids.   . ESOPHAGOGASTRODUODENOSCOPY N/A 05/14/2015   CHRONIC GASTRITIS  . GIVENS CAPSULE STUDY N/A 06/17/2015   Procedure: GIVENS CAPSULE STUDY;  Surgeon: Danie Binder, MD;  Location: [facility];  Service: Endoscopy;  Laterality: N/A;  0800    Family History Family History  Problem Relation Age of Onset  . Hypertension Mother   . Stroke  Maternal Grandmother   . Cancer Neg Hx   . Diabetes Neg Hx   . Heart disease Neg Hx      Social History  reports that he quit smoking about 2 years ago. He has never used smokeless tobacco. He reports that he does not drink alcohol or use drugs.  Medications Prior to Admission medications   Medication Sig Start Date End Date Taking? Authorizing Provider  amLODipine (NORVASC) 5 MG tablet Take 5 mg by mouth daily.   Yes [provider]  amoxicillin-clavulanate (AUGMENTIN) 500-125 MG tablet Take 1 tablet by mouth 2 (two) times daily. 07/16/17 07/24/18 Yes [provider]  Calcium Carbonate-Vitamin D (OSCAL 500/200 D-3 PO) 1 Tablet once a day by mouth . Can't be given with iron   Yes [provider]  clopidogrel (PLAVIX) 75 MG tablet Take 75 mg by mouth daily.   Yes [provider]  docusate sodium (COLACE) 100 MG capsule Take 100 mg by mouth 2 (two) times daily.   Yes [provider]  ferrous sulfate (QC FERROUS SULFATE) 325 (65 FE) MG tablet Take 325 mg by mouth daily.    Yes [provider]  labetalol (NORMODYNE) 200 MG tablet Take 200 mg by mouth 3 (three) times daily. Reported on 10/22/2015   Yes [provider]  linaclotide (LINZESS) 145 MCG CAPS capsule Take 145 mcg by mouth daily before breakfast.   Yes [provider]  lisinopril (PRINIVIL,ZESTRIL) 40 MG tablet Take 40 mg by mouth daily.   Yes [provider]  metFORMIN (GLUCOPHAGE) 500 MG tablet Take 500 mg by mouth 2 (two) times daily with a meal.    Yes [provider]  Multiple Vitamin (MULTIVITAMIN) tablet Take 1 tablet by mouth daily.   Yes [provider]  Omega-3 Fatty Acids (FISH OIL) 1000 MG CAPS Take 1,000 mg by mouth 3 (three) times daily.   Yes [provider]  pantoprazole (PROTONIX) 40 MG tablet Take 40 mg by mouth daily.   Yes [provider]  pravastatin (PRAVACHOL) 80 MG tablet Take 80 mg by mouth daily.    Yes [provider]  Probiotic Product (RISA-BID PROBIOTIC) TABS Take 1 tablet by mouth twice a day from //-//   Yes [provider]  senna (SENOKOT) 8.6 MG TABS tablet Take 2 tablets (17.2 mg total) by mouth daily. 03/10/15  Yes Short, Noah Delaine, MD  tamsulosin (FLOMAX) 0.4 MG CAPS capsule Take 0.4 mg by mouth daily after supper.   Yes [provider]    Allergies Patient has no known allergies.  Review of Systems Review of Systems - Oncology ROS as per HPI otherwise 12 point ROS is negative.   Physical Exam  Vitals temperature 98.5 heart rate 83 blood pressure 150/76. Wt Readings from Last 3 Encounters:  07/22/17 185 lb 3 oz (84 kg)  07/11/17 184 lb 12.8 oz (83.8 kg)  06/16/17 187 lb 9.6  oz (85.1 kg)   Temp Readings from Last 3 Encounters:  07/28/17 98.5 F (36.9 C) (Axillary)  07/22/17 97.9 F (36.6 C) (Oral)  07/18/17 98.2 F (36.8 C) (Oral)   BP Readings from Last 3 Encounters:  07/28/17 (!) 150/76  07/22/17 (!) 117/56  07/18/17 (!) 108/54   Pulse Readings from Last 3 Encounters:  07/28/17 83  07/22/17 82  07/18/17 79   Constitutional: Chronically ill-appearing male in no acute distress. HENT: Legally blind. Mouth/Throat: No oropharyngeal exudate. Mucosa moist. Eyes: Pupils are equal, round, and reactive to light. Conjunctivae are normal. No scleral icterus.  Neck: Normal range of motion. Neck supple. No JVD present.  Cardiovascular: Normal rate, regular rhythm and normal heart sounds.  Exam reveals no gallop and no friction rub.   No murmur heard. Pulmonary/Chest: Coarse breath sounds decreased on right. Abdominal: Soft. Bowel sounds are normal. No distension. There is no tenderness. There is no guarding.  Musculoskeletal: No edema or tenderness.  Lymphadenopathy: No cervical or supraclavicular adenopathy.  Neurological: Alert and oriented to person, place, and time. No cranial nerve deficit.  Skin: Skin is warm and dry. No rash  noted. No erythema. No pallor.  Psychiatric: Affect and judgment normal.   Imaging: Dg Chest 1 View  Result Date: 07/25/2017 CLINICAL DATA:  RIGHT pleural effusion post thoracentesis EXAM: CHEST  1 VIEW COMPARISON:  07/22/2017 FINDINGS: Moderate RIGHT pleural effusion only slightly decreased from previous exam despite removal of 1.7 L of fluid. Persistent atelectasis of the mid to lower RIGHT lung. Heart remains enlarged with postsurgical changes of CABG. Mild LEFT basilar atelectasis. Atherosclerotic calcification aorta. No pneumothorax. IMPRESSION: No pneumothorax following RIGHT thoracentesis. Persistent moderate pleural effusion despite removal of 1.7 L of fluid. Electronically Signed   By: Lavonia Dana M.D.   On: 07/25/2017 14:00   Dg Chest 1 View  Result Date: 07/22/2017 CLINICAL DATA:  RIGHT pleural effusion post thoracentesis with removal of 1.05 L of fluid from the RIGHT chest EXAM: CHEST  1 VIEW COMPARISON:  Earlier study of 07/22/2017 FINDINGS: Enlargement of cardiac silhouette post CABG. Atherosclerotic calcification aorta. Persistent large RIGHT pleural effusion with significant atelectasis of the mid to lower RIGHT lung. Minimal LEFT basilar atelectasis. No pneumothorax. Osseous structures unremarkable. IMPRESSION: Persistent large RIGHT pleural effusion and significant atelectasis of the mid to lower RIGHT lung despite thoracentesis. No pneumothorax. Electronically Signed   By: Lavonia Dana M.D.   On: 07/22/2017 16:55   Dg Chest 2 View  Result Date: 07/22/2017 CLINICAL DATA:  Acutely elevated BUN and white blood cell count. Headache today. EXAM: CHEST - 2 VIEW COMPARISON:  Single-view of the chest 04/05/2015. FINDINGS: The patient has a large right pleural effusion with extensive airspace disease. The left lung is clear. There is cardiomegaly. The patient is status post CABG. Atherosclerosis is noted. IMPRESSION: Large right pleural effusion and associated airspace disease which cannot be  definitively characterized. Cardiomegaly. Electronically Signed   By: Inge Rise M.D.   On: 07/22/2017 15:26   Ct Chest Wo Contrast  Result Date: 07/22/2017 CLINICAL DATA:  RIGHT pleural effusion EXAM: CT CHEST WITHOUT CONTRAST TECHNIQUE: Multidetector CT imaging of the chest was performed following the standard protocol without IV contrast. Sagittal and coronal MPR images reconstructed from axial data set. IV contrast not utilized due to renal dysfunction. COMPARISON:  Chest radiograph 07/22/2017 FINDINGS: Cardiovascular: Atherosclerotic calcifications aorta, proximal great vessels and coronary arteries. Aorta normal caliber. No pericardial effusion. Mediastinum/Nodes: Esophagus unremarkable. Base of cervical region normal appearance.  Enlarged precarinal lymph node 15 mm short axis image 55. Additional scattered normal sized mediastinal nodes. Hilar assessment limited by lack of IV contrast but RIGHT hilum appears slightly enlarged adenopathy questioned. Lungs/Pleura: Large RIGHT pleural effusion. Complete atelectasis of RIGHT lower lobe. Complete atelectasis of RIGHT middle lobe. Partial atelectasis of RIGHT upper lobe. Abnormal soft tissue density at the anteromedial mid RIGHT chest highly suspicious for pleural based tumor. This measures up to 5.1 x 3.5 cm image 67 extends approximately 6.1 cm craniocaudal length. This abuts both the anterior chest wall and the mediastinum. Questionable area of minimal pleural thickening at the inferomedial RIGHT hemithorax. 9 mm LEFT mid lung nodule at major fissure image 73. Question additional midlung nodule 6 mm diameter image 70. Tiny calcified granuloma LEFT upper lobe. Question additional 5 mm LEFT upper lobe nodule. Tiny RIGHT upper lobe pulmonary nodules sagittal images 48 and 54. Upper Abdomen: Unremarkable prior median sternotomy. Musculoskeletal: No acute osseous findings. IMPRESSION: Large RIGHT pleural effusion with complete atelectasis of the RIGHT  middle and RIGHT lower lobes and partial atelectasis of the RIGHT upper lobe. 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 BILATERAL lung nodules. a Extensive atherosclerotic calcifications including coronary arteries. Aortic Atherosclerosis (ICD10-I70.0). Electronically Signed   By: Lavonia Dana M.D.   On: 07/22/2017 19:09   US Abdomen Complete  Result Date: 07/22/2017 CLINICAL DATA:  Elevated BUN and creatinine, does not feel good, RIGHT pleural effusion by chest radiograph EXAM: ABDOMEN ULTRASOUND COMPLETE COMPARISON:  CT abdomen and pelvis 02/02/2015 FINDINGS: Gallbladder: Shadowing gallstone 13 mm diameter within gallbladder. No gallbladder wall thickening, pericholecystic fluid or sonographic Murphy sign. Common bile duct: Diameter: Normal caliber 3 mm diameter Liver: Upper normal hepatic echogenicity. No discrete hepatic mass lesion. Portal vein is patent on color Doppler imaging with normal direction of blood flow towards the liver. IVC: Normal appearance Pancreas: Visualized portion of pancreatic body and proximal tail normal appearance, remainder obscured by bowel gas Spleen: Inadequately visualized due to high subcostal position Right Kidney: Length: 10.9 cm. Normal morphology without mass or hydronephrosis. Left Kidney: Length: 10.6 cm. Normal morphology without mass or hydronephrosis. Abdominal aorta: Normal caliber at proximal and mid portions, bifurcation obscured by bowel gas Other findings: No free fluid IMPRESSION: Cholelithiasis without evidence acute cholecystitis. Incomplete visualization of pancreas, spleen, and distal aorta. Electronically Signed   By: Lavonia Dana M.D.   On: 07/22/2017 16:39   US Thoracentesis Asp Pleural Space W/img Guide  Result Date: 07/25/2017 INDICATION: Recurrent RIGHT pleural effusion EXAM: ULTRASOUND GUIDED THERAPEUTIC RIGHT THORACENTESIS MEDICATIONS: None. COMPLICATIONS: None immediate. PROCEDURE:  Procedure, benefits, and risks of procedure were discussed with patient. Written informed consent for procedure was obtained. Time out protocol followed. Pleural effusion localized by ultrasound at the posterior RIGHT hemithorax. Skin prepped and draped in usual sterile fashion. Skin and soft tissues anesthetized with 10 mL of 1% lidocaine. 8 French thoracentesis catheter placed into the RIGHT pleural space. 1.7 L of dark old bloody fluid aspirated by syringe pump. Procedure tolerated well by patient without immediate complication. FINDINGS: As above IMPRESSION: Successful ultrasound guided RIGHT thoracentesis yielding 1.7 L of dark old bloody pleural fluid. Electronically Signed   By: Lavonia Dana M.D.   On: 07/25/2017 13:59   US Thoracentesis Asp Pleural Space W/img Guide  Result Date: 07/22/2017 INDICATION: Large RIGHT pleural effusion EXAM: ULTRASOUND GUIDED DIAGNOSTIC AND THERAPEUTIC RIGHT THORACENTESIS MEDICATIONS: None COMPLICATIONS: None immediate. PROCEDURE: Procedure, benefits, and  risks of procedure were discussed with patient. Written informed consent for procedure was obtained. Time out protocol followed. Pleural effusion localized by ultrasound at the posterior RIGHT hemithorax. Skin prepped and draped in usual sterile fashion. Skin and soft tissues anesthetized with 10 mL of 1% lidocaine. 8 French thoracentesis catheter placed into the RIGHT pleural space. 1.05 L of dark old bloody fluid aspirated by syringe and vacuum bottle suction. Procedure tolerated well by patient without immediate complication. FINDINGS: A total of approximately 1.05 L of RIGHT pleural fluid was removed. Samples were sent to the laboratory as requested by the clinical team. IMPRESSION: Successful ultrasound guided RIGHT thoracentesis yielding 1.05 L of pleural fluid. Electronically Signed   By: Lavonia Dana M.D.   On: 07/22/2017 16:46    Path reports from thoracentesis that was done on 07/25/2017 showed malignant cells  consistent with non-small cell carcinoma.  CBC: Recent Labs    07/23/17 0617 07/24/17 0927 07/25/17 0513 07/26/17 0647  WBC 16.9* 17.0* 17.6* 19.3*  HGB 12.2* 11.4* 11.6* 12.7*  HCT 37.5* 35.0* 36.3* 39.3  PLT 421* 405* 445* 457*    COAGS: No results for input(s): INR, APTT in the last 8760 hours.  BMP: Recent Labs    07/23/17 0617 07/24/17 0927 07/25/17 0513 07/26/17 0647  NA 142 140 142 142  K 4.4 4.1 4.1 4.2  CL 111 112* 112* 111  CO2 18* 18* 19* 20*  GLUCOSE 108* 157* 96 124*  BUN 57* 39* 27* 20  CALCIUM 8.7* 8.5* 8.5* 8.7*  CREATININE 2.05* 1.12 0.97 0.93  GFRNONAA 29* >60 >60 >60  GFRAA 34* >60 >60 >60    LIVER FUNCTION TESTS: Recent Labs    07/05/17 0704 07/12/17 1420 07/22/17 1240 07/22/17 1506  BILITOT 0.3 0.4 0.6 0.6  AST 13* 16 17 15   ALT 10* 9* 11* 12*  ALKPHOS 62 62 58 54  PROT 7.0 6.8 7.4 7.6  ALBUMIN 3.2* 3.2* 3.2* 3.2*     Rishabh Rinkenberger B. Gabrelle Roca, MD

## 2017-07-28 NOTE — Progress Notes (Signed)
Daily Progress Note   Patient Name: [name]       Date: 07/28/2017 DOB: 11-21-1934  Age: 83 y.o. MRN#: [id number] Attending Physician: Louellen Molder, MD Primary Care Physician: [name], MD Admit Date: 07/22/2017  Reason for Consultation/Follow-up: Establishing goals of care, Hospice Evaluation and Psychosocial/spiritual support  Subjective: [name] is resting quietly in bed.  He is alert and oriented, calm and cooperative.  He is surrounded by his family including his daughter Enid Derry and her husband, daughter Neoma Laming and her husband Audry Pili, and granddaughter Ronald Lobo.  With permission, we talked about cancer staging.  I share that due to malignant cells in pleural fluid, [name] cancer is considered stage IV.  We talked about oncology consult later today.  We also review options for treatment and physical and emotional cost of treatment.  We talked about Pleurx catheter placement scheduled for 3/29.  [name] is to be transferred to [facility] for this to be placed and then return to [facility].  [name] asks about the option of hospice.  We talked about hospice as a free benefit, already paid for in his Medicare.  We talked about the service of an RN coming to [place] [facility] to monitor his needs.  I share that he would likely not qualify for hospice if he elects to take cancer treatment.  Conference with daughters and granddaughters outside of room related to prognosis.  We discussed the signs and symptoms that are normal and expected as people near end of life.  We also reviewed labs in detail.  Conference with chaplain Joya Gaskins related to completion of healthcare power of attorney paperwork for [name]  has stated on multiple  occasions to multiple people that his desire is for his granddaughter, Ronald Lobo to be his surrogate decision-maker.  Conference with Dr. Walden Field with oncology related to plan of care, consult.  Conference with Dr. Luan Pulling related to plan of care.  Conference with Social worker related to return to facility, likely with Hospice services.   Conference with hospitalist, Dr. Clementeen Graham related to plan of care and disposition.   Length of Stay: 6  Current Medications: Scheduled Meds:  . acidophilus  1 capsule Oral BID  . docusate sodium  100 mg Oral BID  . [START ON 07/25/2017] heparin  5,000 Units Subcutaneous Q8H  . insulin aspart  0-5 Units Subcutaneous QHS  . insulin aspart  0-9 Units Subcutaneous TID WC  . labetalol  200 mg Oral Q8H  . linaclotide  145 mcg Oral QAC breakfast  . mouth rinse  15 mL Mouth Rinse BID  . multivitamin with minerals  1 tablet Oral Daily  . omega-3 acid ethyl esters  1 g Oral BID  . pantoprazole  40 mg Oral Daily  . pravastatin  80 mg Oral q1800  . senna  2 tablet Oral Daily  . tamsulosin  0.4 mg Oral QPC supper    Continuous Infusions:   PRN Meds: acetaminophen **OR** acetaminophen, HYDROcodone-acetaminophen, ondansetron **OR** ondansetron (ZOFRAN) IV, polyethylene glycol  Physical Exam  Constitutional: He is oriented to person, place, and time. No distress.  Appears somewhat frail, chronically ill   HENT:  Head: Atraumatic.  Legally blind, deformation of left eye  Cardiovascular: Normal rate.  Pulmonary/Chest: Effort normal. No respiratory distress.  Abdominal: Soft. He exhibits no distension.  Musculoskeletal: He exhibits no edema.  Neurological: He is alert and oriented to person, place, and time.  Skin: Skin is warm and dry.  Psychiatric:  Calm and cooperative   Nursing note and vitals reviewed.           Vital Signs: BP (!) 150/76 (BP Location: Right Arm)   Pulse 83   Temp 98.5 F (36.9 C) (Axillary)   Resp 20   Ht 5\' 9"   (1.753 m)   Wt 84 kg (185 lb 3 oz)   SpO2 99%   BMI 27.35 kg/m  SpO2: SpO2: 99 % O2 Device: O2 Device: Room Air O2 Flow Rate: O2 Flow Rate (L/min): 2 L/min  Intake/output summary: No intake or output data in the 24 hours ending 07/28/17 1332 LBM: Last BM Date: 07/26/17 Baseline Weight: Weight: 83.9 kg (185 lb) Most recent weight: Weight: 84 kg (185 lb 3 oz)       Palliative Assessment/Data:    Flowsheet Rows     Most Recent Value  Intake Tab  Referral Department  Hospitalist  Unit at Time of Referral  Cardiac/Telemetry Unit  Palliative Care Primary Diagnosis  Pulmonary  Date Notified  07/25/17  Palliative Care Type  New Palliative care  Reason for referral  Clarify Goals of Care, Psychosocial or Spiritual support  Date of Admission  07/22/17  Date first seen by Palliative Care  07/26/17  # of days Palliative referral response time  1 Day(s)  # of days IP prior to Palliative referral  3  Clinical Assessment  Palliative Performance Scale Score  40%  Pain Max last 24 hours  Not able to report  Pain Min Last 24 hours  Not able to report  Dyspnea Max Last 24 Hours  Not able to report  Dyspnea Min Last 24 hours  Not able to report  Psychosocial & Spiritual Assessment  Palliative Care Outcomes  Patient/Family meeting held?  Yes  Who was at the meeting?  patient at bedside.   Patient/Family wishes: Interventions discontinued/not started   Mechanical Ventilation      Patient Active Problem List   Diagnosis Date Noted  . Malignant pleural effusion   . Encounter for hospice care discussion   . DNR (do not resuscitate) discussion   . Palliative care by specialist   . Goals of care, counseling/discussion   . Pleural effusion on right 07/22/2017  . Pulmonary mass 07/22/2017  . Lung nodules 07/22/2017  . Recurrent right  pleural effusion 07/22/2017  . UTI (urinary tract infection) 07/22/2017  . DM (diabetes mellitus) with peripheral vascular complication ([place]) 40/34/7425  .  Anemia due to chronic blood loss 03/26/2015  . Constipation 03/06/2015  . AKI (acute kidney injury) ([place]) 03/06/2015  . Elevated troponin 03/06/2015  . Iron deficiency anemia 04/09/2013  . COPD (chronic obstructive pulmonary disease) ([place]) 11/18/2009  . HLD (hyperlipidemia) 11/18/2009  . Hypertension 11/18/2009  . INTERMEDIATE CORONARY SYNDROME 11/18/2009  . Coronary atherosclerosis 11/18/2009  . PVD 11/18/2009  . History of cardioembolic cerebrovascular accident (CVA) 11/18/2009    Palliative Care Assessment & Plan   Patient Profile: 83 y.o.malewith past medical history of HTN, DM 2, CAD,CABG 2000, CVA w/ left weakness/dysphagia, COPD, blindness,recent pneumonia status post treatment with Levaquin, and recent UTI, SNF resident for around 2 years,admitted on3/22/2019with Pleural effusion with pleural based mass on right.   Recommendations/Plan: Pleural effusion with pleural based mass on right, cytology report shows malignant cells consistent with non-cell carcinoma: Patient and family are agreeable to investigate Pleurx drain for comfort.  Also agreeable for oncology consult to cover options for possible treatment.  Recommendations/Plan: Pleurx drain for comfort planned with IR at [facility] 3/29 with return to [facility], and likely discharge.   Oncology consult to cover options for possible treatment today. Suggest hospice care at residential SNF.  Goals of Care and Additional Recommendations:  Limitations on Scope of Treatment: Continue to treat the treatable, but NO CPR, no intubation.   Code Status:    Code Status Orders  (From admission, onward)        Start     Ordered   07/22/17 2115  Do not attempt resuscitation (DNR)  Continuous    Question Answer Comment  In the event of cardiac or respiratory ARREST Do not call a "code blue"   In the event of cardiac or respiratory ARREST Do not perform Intubation, CPR, defibrillation or ACLS   In the event of cardiac or respiratory  ARREST Use medication by any route, position, wound care, and other measures to relive pain and suffering. May use oxygen, suction and manual treatment of airway obstruction as needed for comfort.      07/22/17 2114    Code Status History    Date Active Date Inactive Code Status Order ID Comments User Context   07/22/2017 2045 07/22/2017 2114 Full Code [id number]  Vianne Bulls, MD ED   03/27/2015 2320 03/28/2015 2112 Full Code [id number]  Leone Brand, MD Inpatient   03/06/2015 0114 03/10/2015 2100 Full Code [id number]  Ivor Costa, MD ED    Advance Directive Documentation     Most Recent Value  Type of Advance Directive  Out of facility DNR (pink MOST or yellow form)  Pre-existing out of facility DNR order (yellow form or pink MOST form)  -  "MOST" Form in Place?  -       Prognosis:   < 6 months or less would not be surprising, based on current health status, and stage 4 lung cancer.  Discharge Planning:  .Return to the [place] center, where he is a resident, when stable.  Care plan was discussed with nursing staff, Alfonso Patten, Social Worker, Dr. Walden Field Oncology, and Dr. Cydney Ok.   Thank you for allowing the Palliative Medicine Team to assist in the care of this patient.   Time In: 1010 Time Out: 1140 Total Time 90 minuites Prolonged Time Billed  yes       Greater than 50%  of  this time was spent counseling and coordinating care related to the above assessment and plan.  Drue Novel, NP  Please contact Palliative Medicine Team phone at [phone] for questions and concerns.

## 2017-07-28 NOTE — Care Management Note (Signed)
Case Management Note  Patient Details  Name: [name] MRN: [id number] Date of Birth: 03-07-1935  If discussed at Long Length of Stay Meetings, dates discussed:  07/28/17  Additional Comments:  Sherald Barge, RN 07/28/2017, 12:56 PM

## 2017-07-28 NOTE — Progress Notes (Signed)
PROGRESS NOTE                                                                                                                                                                                                             Patient Demographics:    [name], is a 83 y.o. male, DOB - 05-11-1934, KJZ:[id number]  Admit date - 07/22/2017   Admitting Physician Vianne Bulls, MD  Outpatient Primary MD for the patient is Darin Dad, MD  LOS - 6  Outpatient Specialists: None  Chief Complaint  Patient presents with  . Abnormal Lab       Brief Narrative   83 year old male with history of type 2 diabetes mellitus, CAD, tobacco use with recent bleed treated pneumonia and UTI admitted with malaise, anorexia, shortness of breath and cough.  Workup showed large right pleural effusion with a possible pleural-based mass and underwent thoracentesis x2 with cytology showing non-small cell lung cancer..    Subjective:   Patient seen and examined.  No family at bedside on my evaluation.  Patient is aware about his right lung effusion but is not clear about underlying cancer (this was discussed in detail earlier by my partner, palliative care and oncologist with the patient).    Assessment  & Plan :    Principal Problem: Malignant  right-sided pleural effusion.   Cytology showing non-small cell carcinoma.  Recurrent effusion despite thoracentesis.  IR consulted for Pleurx catheter placement tomorrow at [facility]. Palliative care and oncology consult appreciated.  Overall prognosis per oncology with his generalized deconditioning and comorbidities are poor and recommend referral to hospice. As per discussion with palliative care patient and family would like to see for any options on chemotherapy.  He will return back to [facility] after the Pleurx catheter placed with palliative care evaluation and follow-up there.  I will  discuss this with his granddaughter [name] who makes healthcare decision for him.   Active Problems: Chronic obstructive pulmonary disease Stable.  Continue inhalers.  Essential hypertension Stable.  Continue labetalol.  Coronary artery disease Continue statin.  Acute kidney injury Possibly prerenal, now improved.  Elevated troponin Suspect demand ischemia.  No chest pain symptoms or EKG changes.  Aspirin and Plavix on hold for thoracentesis and Pleurx catheter placement.  Diabetes mellitus with peripheral vascular disease. A1c of 7.3.  On sliding scale coverage.  Metformin on hold.  Iron deficiency anemia Stable hemoglobin  Proteus UTI Recently treated with a course of Augmentin.  Leukocytosis Likely inflammatory/reactive.  Pleural fluid culture negative.      Code Status : DNR  Family Communication  : None at bedside (unable to reach his granddaughter on the phone)  Disposition Plan  : Return to SNF possibly in 1-2 days after Pleurx catheter placed tomorrow.  Barriers For Discharge : Active symptoms  Consults  : Oncology, palliative care, pulmonary  Procedures  : Thoracentesis  DVT Prophylaxis  : SCDs  Lab Results  Component Value Date   PLT 457 (H) 07/26/2017    Antibiotics  :   Anti-infectives (From admission, onward)   Start     Dose/Rate Route Frequency Ordered Stop   07/22/17 2200  amoxicillin-clavulanate (AUGMENTIN) 500-125 MG per tablet 500 mg  Status:  Discontinued     1 tablet Oral 2 times daily 07/22/17 2045 07/24/17 1906        Objective:   Vitals:   07/27/17 1952 07/27/17 2048 07/28/17 0628 07/28/17 1300  BP:  (!) 151/56 (!) 150/76 (!) 159/85  Pulse:  77 83 79  Resp:      Temp:  98.2 F (36.8 C) 98.5 F (36.9 C) 98.6 F (37 C)  TempSrc:  Oral Axillary Axillary  SpO2: 92% 98% 99% 99%  Weight:      Height:        Wt Readings from Last 3 Encounters:  07/22/17 84 kg (185 lb 3 oz)  07/11/17 83.8 kg (184 lb 12.8 oz)  06/16/17  85.1 kg (187 lb 9.6 oz)    No intake or output data in the 24 hours ending 07/28/17 1613   Physical Exam  Gen: not in distress, fatigued HEENT: no pallor, moist mucosa, supple neck Chest: Diminished right-sided breath sounds CVS: N S1&S2, no murmurs, GI: soft, NT, ND, BS+ Musculoskeletal: warm, no edema     Data Review:    CBC Recent Labs  Lab 07/22/17 1240 07/22/17 1506 07/23/17 0617 07/24/17 0927 07/25/17 0513 07/26/17 0647  WBC 18.3* 18.5* 16.9* 17.0* 17.6* 19.3*  HGB 12.0* 11.1* 12.2* 11.4* 11.6* 12.7*  HCT 36.6* 35.0* 37.5* 35.0* 36.3* 39.3  PLT 417* 422* 421* 405* 445* 457*  MCV 82.1 83.1 83.1 82.4 82.9 83.1  MCH 26.9 26.4 27.1 26.8 26.5 26.8  MCHC 32.8 31.7 32.5 32.6 32.0 32.3  RDW 13.8 13.8 14.1 13.9 14.1 14.0  LYMPHSABS 1.7 1.9 1.9  --   --   --   MONOABS 1.5* 1.3* 1.8*  --   --   --   EOSABS 0.8* 0.7 1.0*  --   --   --   BASOSABS 0.1 0.1 0.1  --   --   --     Chemistries  Recent Labs  Lab 07/22/17 1240 07/22/17 1506 07/23/17 0617 07/24/17 0927 07/25/17 0513 07/26/17 0647  NA 138 139 142 140 142 142  K 5.0 4.3 4.4 4.1 4.1 4.2  CL 107 108 111 112* 112* 111  CO2 16* 17* 18* 18* 19* 20*  GLUCOSE 152* 196* 108* 157* 96 124*  BUN 58* 58* 57* 39* 27* 20  CREATININE 2.67* 2.97* 2.05* 1.12 0.97 0.93  CALCIUM 9.2 8.8* 8.7* 8.5* 8.5* 8.7*  AST 17 15  --   --   --   --   ALT 11* 12*  --   --   --   --   [facility]  58 54  --   --   --   --   BILITOT 0.6 0.6  --   --   --   --    ------------------------------------------------------------------------------------------------------------------ No results for input(s): CHOL, HDL, LDLCALC, TRIG, CHOLHDL, LDLDIRECT in the last 72 hours.  Lab Results  Component Value Date   HGBA1C 7.3 (H) 06/21/2017   ------------------------------------------------------------------------------------------------------------------ No results for input(s): TSH, T4TOTAL, T3FREE, THYROIDAB in the last 72 hours.  Invalid  input(s): FREET3 ------------------------------------------------------------------------------------------------------------------ No results for input(s): VITAMINB12, FOLATE, FERRITIN, TIBC, IRON, RETICCTPCT in the last 72 hours.  Coagulation profile Recent Labs  Lab 07/28/17 1244  INR 1.17    No results for input(s): DDIMER in the last 72 hours.  Cardiac Enzymes Recent Labs  Lab 07/22/17 2243 07/23/17 0617 07/23/17 1054  TROPONINI 0.03* 0.03* <0.03   ------------------------------------------------------------------------------------------------------------------    Component Value Date/Time   BNP 79.0 07/22/2017 1507    Inpatient Medications  Scheduled Meds: . acidophilus  1 capsule Oral BID  . docusate sodium  100 mg Oral BID  . [START ON 07/28/2017] heparin  5,000 Units Subcutaneous Q8H  . insulin aspart  0-5 Units Subcutaneous QHS  . insulin aspart  0-9 Units Subcutaneous TID WC  . labetalol  200 mg Oral Q8H  . linaclotide  145 mcg Oral QAC breakfast  . mouth rinse  15 mL Mouth Rinse BID  . multivitamin with minerals  1 tablet Oral Daily  . omega-3 acid ethyl esters  1 g Oral BID  . pantoprazole  40 mg Oral Daily  . pravastatin  80 mg Oral q1800  . senna  2 tablet Oral Daily  . tamsulosin  0.4 mg Oral QPC supper   Continuous Infusions: PRN Meds:.acetaminophen **OR** acetaminophen, HYDROcodone-acetaminophen, ondansetron **OR** ondansetron (ZOFRAN) IV, polyethylene glycol  Micro Results Recent Results (from the past 240 hour(s))  Gram stain     Status: None   Collection Time: 07/22/17  4:20 PM  Result Value Ref Range Status   Specimen Description THORACENTESIS  Final   Special Requests NONE  Final   Gram Stain   Final    CYTOSPIN SMEAR NO ORGANISMS SEEN WBC PRESENT,BOTH PMN AND MONONUCLEAR Performed at [facility], [address], [place], [place] [address]    Report Status 07/22/2017 FINAL  Final  Acid Fast Smear (AFB)     Status: None   Collection  Time: 07/22/17  4:20 PM  Result Value Ref Range Status   AFB Specimen Processing Concentration  Final   Acid Fast Smear Negative  Final    Comment: (NOTE) Performed At: [facility] [address] [place], [place] [id number] Rush Farmer MD XB:[id number]    Source (AFB) PLEURAL  Final    Comment: Performed at [facility], [address] [place], [place] [address]  Culture, body fluid-bottle     Status: None   Collection Time: 07/22/17  4:20 PM  Result Value Ref Range Status   Specimen Description PLEURAL  Final   Special Requests BOTTLES DRAWN AEROBIC AND ANAEROBIC 10CC  Final   Culture   Final    NO GROWTH 5 DAYS Performed at [facility], [address], [place], [place] [address]    Report Status 07/27/2017 FINAL  Final  MRSA PCR Screening     Status: None   Collection Time: 07/22/17  9:22 PM  Result Value Ref Range Status   MRSA by PCR NEGATIVE NEGATIVE Final    Comment:        The GeneXpert  MRSA Assay (FDA approved for NASAL specimens only), is one component of a comprehensive MRSA colonization surveillance program. It is not intended to diagnose MRSA infection nor to guide or monitor treatment for MRSA infections. Performed at [facility], [address], [place], [place] [address]   Urine culture     Status: None   Collection Time: 07/23/17 12:17 AM  Result Value Ref Range Status   Specimen Description   Final    URINE, CLEAN CATCH Performed at [facility], [address], [place], [place] [address]    Special Requests   Final    NONE Performed at [facility], [address], [place], [place] [address]    Culture   Final    NO GROWTH Performed at [facility], [place] [address] [place], [place] [address]    Report Status 07/24/2017 FINAL  Final    Radiology Reports Dg Chest 1 View  Result Date: 07/25/2017 CLINICAL DATA:  RIGHT pleural effusion post thoracentesis EXAM: CHEST  1 VIEW COMPARISON:  07/22/2017 FINDINGS: Moderate RIGHT pleural  effusion only slightly decreased from previous exam despite removal of 1.7 L of fluid. Persistent atelectasis of the mid to lower RIGHT lung. Heart remains enlarged with postsurgical changes of CABG. Mild LEFT basilar atelectasis. Atherosclerotic calcification aorta. No pneumothorax. IMPRESSION: No pneumothorax following RIGHT thoracentesis. Persistent moderate pleural effusion despite removal of 1.7 L of fluid. Electronically Signed   By: Lavonia Dana M.D.   On: 07/25/2017 14:00   Dg Chest 1 View  Result Date: 07/22/2017 CLINICAL DATA:  RIGHT pleural effusion post thoracentesis with removal of 1.05 L of fluid from the RIGHT chest EXAM: CHEST  1 VIEW COMPARISON:  Earlier study of 07/22/2017 FINDINGS: Enlargement of cardiac silhouette post CABG. Atherosclerotic calcification aorta. Persistent large RIGHT pleural effusion with significant atelectasis of the mid to lower RIGHT lung. Minimal LEFT basilar atelectasis. No pneumothorax. Osseous structures unremarkable. IMPRESSION: Persistent large RIGHT pleural effusion and significant atelectasis of the mid to lower RIGHT lung despite thoracentesis. No pneumothorax. Electronically Signed   By: Lavonia Dana M.D.   On: 07/22/2017 16:55   Dg Chest 2 View  Result Date: 07/22/2017 CLINICAL DATA:  Acutely elevated BUN and white blood cell count. Headache today. EXAM: CHEST - 2 VIEW COMPARISON:  Single-view of the chest 04/05/2015. FINDINGS: The patient has a large right pleural effusion with extensive airspace disease. The left lung is clear. There is cardiomegaly. The patient is status post CABG. Atherosclerosis is noted. IMPRESSION: Large right pleural effusion and associated airspace disease which cannot be definitively characterized. Cardiomegaly. Electronically Signed   By: Inge Rise M.D.   On: 07/22/2017 15:26   Ct Chest Wo Contrast  Result Date: 07/22/2017 CLINICAL DATA:  RIGHT pleural effusion EXAM: CT CHEST WITHOUT CONTRAST TECHNIQUE: Multidetector CT  imaging of the chest was performed following the standard protocol without IV contrast. Sagittal and coronal MPR images reconstructed from axial data set. IV contrast not utilized due to renal dysfunction. COMPARISON:  Chest radiograph 07/22/2017 FINDINGS: Cardiovascular: Atherosclerotic calcifications aorta, proximal great vessels and coronary arteries. Aorta normal caliber. No pericardial effusion. Mediastinum/Nodes: Esophagus unremarkable. Base of cervical region normal appearance. Enlarged precarinal lymph node 15 mm short axis image 55. Additional scattered normal sized mediastinal nodes. Hilar assessment limited by lack of IV contrast but RIGHT hilum appears slightly enlarged adenopathy questioned. Lungs/Pleura: Large RIGHT pleural effusion. Complete atelectasis of RIGHT lower lobe. Complete atelectasis of RIGHT middle lobe. Partial atelectasis of RIGHT upper lobe. Abnormal  soft tissue density at the anteromedial mid RIGHT chest highly suspicious for pleural based tumor. This measures up to 5.1 x 3.5 cm image 67 extends approximately 6.1 cm craniocaudal length. This abuts both the anterior chest wall and the mediastinum. Questionable area of minimal pleural thickening at the inferomedial RIGHT hemithorax. 9 mm LEFT mid lung nodule at major fissure image 73. Question additional midlung nodule 6 mm diameter image 70. Tiny calcified granuloma LEFT upper lobe. Question additional 5 mm LEFT upper lobe nodule. Tiny RIGHT upper lobe pulmonary nodules sagittal images 48 and 54. Upper Abdomen: Unremarkable prior median sternotomy. Musculoskeletal: No acute osseous findings. IMPRESSION: Large RIGHT pleural effusion with complete atelectasis of the RIGHT middle and RIGHT lower lobes and partial atelectasis of the RIGHT upper lobe. 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 BILATERAL lung nodules. a Extensive atherosclerotic calcifications including coronary  arteries. Aortic Atherosclerosis (ICD10-I70.0). Electronically Signed   By: Lavonia Dana M.D.   On: 07/22/2017 19:09   US Abdomen Complete  Result Date: 07/22/2017 CLINICAL DATA:  Elevated BUN and creatinine, does not feel good, RIGHT pleural effusion by chest radiograph EXAM: ABDOMEN ULTRASOUND COMPLETE COMPARISON:  CT abdomen and pelvis 02/02/2015 FINDINGS: Gallbladder: Shadowing gallstone 13 mm diameter within gallbladder. No gallbladder wall thickening, pericholecystic fluid or sonographic Murphy sign. Common bile duct: Diameter: Normal caliber 3 mm diameter Liver: Upper normal hepatic echogenicity. No discrete hepatic mass lesion. Portal vein is patent on color Doppler imaging with normal direction of blood flow towards the liver. IVC: Normal appearance Pancreas: Visualized portion of pancreatic body and proximal tail normal appearance, remainder obscured by bowel gas Spleen: Inadequately visualized due to high subcostal position Right Kidney: Length: 10.9 cm. Normal morphology without mass or hydronephrosis. Left Kidney: Length: 10.6 cm. Normal morphology without mass or hydronephrosis. Abdominal aorta: Normal caliber at proximal and mid portions, bifurcation obscured by bowel gas Other findings: No free fluid IMPRESSION: Cholelithiasis without evidence acute cholecystitis. Incomplete visualization of pancreas, spleen, and distal aorta. Electronically Signed   By: Lavonia Dana M.D.   On: 07/22/2017 16:39   US Thoracentesis Asp Pleural Space W/img Guide  Result Date: 07/25/2017 INDICATION: Recurrent RIGHT pleural effusion EXAM: ULTRASOUND GUIDED THERAPEUTIC RIGHT THORACENTESIS MEDICATIONS: None. COMPLICATIONS: None immediate. PROCEDURE: Procedure, benefits, and risks of procedure were discussed with patient. Written informed consent for procedure was obtained. Time out protocol followed. Pleural effusion localized by ultrasound at the posterior RIGHT hemithorax. Skin prepped and draped in usual sterile  fashion. Skin and soft tissues anesthetized with 10 mL of 1% lidocaine. 8 French thoracentesis catheter placed into the RIGHT pleural space. 1.7 L of dark old bloody fluid aspirated by syringe pump. Procedure tolerated well by patient without immediate complication. FINDINGS: As above IMPRESSION: Successful ultrasound guided RIGHT thoracentesis yielding 1.7 L of dark old bloody pleural fluid. Electronically Signed   By: Lavonia Dana M.D.   On: 07/25/2017 13:59   US Thoracentesis Asp Pleural Space W/img Guide  Result Date: 07/22/2017 INDICATION: Large RIGHT pleural effusion EXAM: ULTRASOUND GUIDED DIAGNOSTIC AND THERAPEUTIC RIGHT THORACENTESIS MEDICATIONS: None COMPLICATIONS: None immediate. PROCEDURE: Procedure, benefits, and risks of procedure were discussed with patient. Written informed consent for procedure was obtained. Time out protocol followed. Pleural effusion localized by ultrasound at the posterior RIGHT hemithorax. Skin prepped and draped in usual sterile fashion. Skin and soft tissues anesthetized with 10 mL of 1% lidocaine. 8 French thoracentesis catheter placed into the RIGHT pleural  space. 1.05 L of dark old bloody fluid aspirated by syringe and vacuum bottle suction. Procedure tolerated well by patient without immediate complication. FINDINGS: A total of approximately 1.05 L of RIGHT pleural fluid was removed. Samples were sent to the laboratory as requested by the clinical team. IMPRESSION: Successful ultrasound guided RIGHT thoracentesis yielding 1.05 L of pleural fluid. Electronically Signed   By: Lavonia Dana M.D.   On: 07/22/2017 16:46    Time Spent in minutes  25   Marlon Vonruden M.D on 07/28/2017 at 4:13 PM  Between 7am to 7pm - Pager - [phone]  After 7pm go to [email or web address] - password [email or web address]  [facility] -  Office  [phone]

## 2017-07-29 ENCOUNTER — Ambulatory Visit (HOSPITAL_COMMUNITY)
Admit: 2017-07-29 | Discharge: 2017-07-29 | Disposition: A | Discharge status: Home / Self Care | Payer: Medicare Other | Attending: Primary Care | Admitting: Primary Care

## 2017-07-29 ENCOUNTER — Encounter (HOSPITAL_COMMUNITY): Payer: Self-pay | Admitting: Interventional Radiology

## 2017-07-29 ENCOUNTER — Ambulatory Visit (HOSPITAL_COMMUNITY): Payer: Medicare Other

## 2017-07-29 DIAGNOSIS — J910 Malignant pleural effusion: Secondary | ICD-10-CM | POA: Diagnosis not present

## 2017-07-29 DIAGNOSIS — C3490 Malignant neoplasm of unspecified part of unspecified bronchus or lung: Secondary | ICD-10-CM | POA: Diagnosis not present

## 2017-07-29 HISTORY — PX: IR GUIDED DRAIN W CATHETER PLACEMENT: IMG719

## 2017-07-29 LAB — GLUCOSE, CAPILLARY
Glucose-Capillary: 155 mg/dL — ABNORMAL HIGH (ref 65–99)
Glucose-Capillary: 127 mg/dL — ABNORMAL HIGH (ref 65–99)
Glucose-Capillary: 157 mg/dL — ABNORMAL HIGH (ref 65–99)
Glucose-Capillary: 162 mg/dL — ABNORMAL HIGH (ref 65–99)

## 2017-07-29 MED ORDER — MIDAZOLAM HCL 2 MG/2ML IJ SOLN
INTRAMUSCULAR | Status: AC | PRN
  Administered 2017-07-29 (×2): 1 mg via INTRAVENOUS

## 2017-07-29 MED ORDER — CEFAZOLIN (ANCEF) 1 G IV SOLR: 2.0000 g | INTRAVENOUS | Status: DC

## 2017-07-29 MED ORDER — FENTANYL CITRATE (PF) 100 MCG/2ML IJ SOLN
INTRAMUSCULAR | Status: AC | PRN
  Administered 2017-07-29 (×2): 25 ug via INTRAVENOUS

## 2017-07-29 MED ORDER — CEFAZOLIN SODIUM-DEXTROSE 2-4 GM/100ML-% IV SOLN
2.0000 g | Freq: Once | INTRAVENOUS | Status: AC
  Administered 2017-07-29: 2 g via INTRAVENOUS

## 2017-07-29 MED FILL — Ondansetron HCl Inj 4 MG/2ML (2 MG/ML): INTRAMUSCULAR | Qty: 2 | Status: AC

## 2017-07-29 NOTE — Progress Notes (Signed)
Planned to discuss and aid completion of Advance Directives. [name] was at [facility] this morning. Left packet and contact information in his room. Hope to follow up either here or at [facility].

## 2017-07-29 NOTE — Sedation Documentation (Signed)
Patient is resting comfortably. 

## 2017-07-29 NOTE — Progress Notes (Signed)
PROGRESS NOTE                                                                                                                                                                                                             Patient Demographics:    [name], is a 83 y.o. male, DOB - February 01, 1935, JSH:[id number]  Admit date - 07/22/2017   Admitting Physician Vianne Bulls, MD  Outpatient Primary MD for the patient is [name], MD  LOS - 7  Outpatient Specialists: None  Chief Complaint  Patient presents with  . Abnormal Lab       Brief Narrative   83 year old male with history of type 2 diabetes mellitus, CAD, tobacco use with recent bleed treated pneumonia and UTI admitted with malaise, anorexia, shortness of breath and cough.  Workup showed large right pleural effusion with a possible pleural-based mass and underwent thoracentesis x2 with cytology showing non-small cell lung cancer..    Subjective:   Is seen after returning from Pleurx catheter placement.  Denies any pain.  Communicates poorly.    Assessment  & Plan :    Principal Problem: Malignant  right-sided pleural effusion.   Cytology showing non-small cell carcinoma.  Recurrent effusion despite thoracentesis.  IR consulted and right-sided Pleurx catheter placed at [facility] today with 1.6 L fluid removed. Palliative care and oncology consult appreciated.  Overall prognosis per oncology with his generalized deconditioning and comorbidities are poor and recommend referral to hospice. As per discussion with palliative care patient and family would like to see for any options on chemotherapy.  He will return back to [facility] [facility] nursing center tomorrow.  I will discuss this with his granddaughter [name] who makes healthcare decision for him.  I was unable to reach her yesterday.   Active Problems: Chronic obstructive pulmonary disease Stable.  Continue  inhalers.  Essential hypertension Stable.  Continue labetalol.  Coronary artery disease Continue statin.  Acute kidney injury Possibly prerenal, now improved.  Elevated troponin Suspect demand ischemia.  No chest pain symptoms or EKG changes.  Aspirin and Plavix on hold for thoracentesis and Pleurx catheter placement.  Diabetes mellitus with peripheral vascular disease. A1c of 7.3.  On sliding scale coverage.  Metformin on hold.  Iron deficiency anemia Stable hemoglobin  Proteus UTI Recently treated with a course of Augmentin.  Leukocytosis Likely inflammatory/reactive.  Pleural fluid culture negative.      Code Status : DNR  Family Communication  : None at bedside (will try to contact his granddaughter again today)  Disposition Plan  : Return to SNF possibly tomorrow.  Barriers For Discharge : Active symptoms  Consults  : Oncology, palliative care, pulmonary  Procedures  : Thoracentesis  DVT Prophylaxis  : SCDs  Lab Results  Component Value Date   PLT 457 (H) 07/26/2017    Antibiotics  :   Anti-infectives (From admission, onward)   Start     Dose/Rate Route Frequency Ordered Stop   07/22/17 2200  amoxicillin-clavulanate (AUGMENTIN) 500-125 MG per tablet 500 mg  Status:  Discontinued     1 tablet Oral 2 times daily 07/22/17 2045 07/24/17 1906        Objective:   Vitals:   07/28/17 0628 07/28/17 1300 07/28/17 2027 07/29/17 0555  BP: (!) 150/76 (!) 159/85 (!) 143/67 (!) 142/78  Pulse: 83 79 80 84  Resp:   18 18  Temp: 98.5 F (36.9 C) 98.6 F (37 C) 98.3 F (36.8 C) 98.3 F (36.8 C)  TempSrc: Axillary Axillary Axillary Axillary  SpO2: 99% 99% 96% 100%  Weight:    76.6 kg (168 lb 14 oz)  Height:        Wt Readings from Last 3 Encounters:  07/29/17 76.6 kg (168 lb 14 oz)  07/11/17 83.8 kg (184 lb 12.8 oz)  06/16/17 85.1 kg (187 lb 9.6 oz)     Intake/Output Summary (Last 24 hours) at 07/29/2017 1436 Last data filed at 07/29/2017 0900 Gross  per 24 hour  Intake 240 ml  Output 150 ml  Net 90 ml     Physical Exam General: Elderly male not in distress, appears fatigued HEENT: Moist mucosa, supple neck Chest: Diminished but improved breath sounds over right lung with a right-sided Pleurx catheter in place   CVS: Normal S1 and S2, no murmurs GI: Soft, nondistended, nontender Musculoskeletal: Warm, no edema      Data Review:    CBC Recent Labs  Lab 07/22/17 1506 07/23/17 0617 07/24/17 0927 07/25/17 0513 07/26/17 0647  WBC 18.5* 16.9* 17.0* 17.6* 19.3*  HGB 11.1* 12.2* 11.4* 11.6* 12.7*  HCT 35.0* 37.5* 35.0* 36.3* 39.3  PLT 422* 421* 405* 445* 457*  MCV 83.1 83.1 82.4 82.9 83.1  MCH 26.4 27.1 26.8 26.5 26.8  MCHC 31.7 32.5 32.6 32.0 32.3  RDW 13.8 14.1 13.9 14.1 14.0  LYMPHSABS 1.9 1.9  --   --   --   MONOABS 1.3* 1.8*  --   --   --   EOSABS 0.7 1.0*  --   --   --   BASOSABS 0.1 0.1  --   --   --     Chemistries  Recent Labs  Lab 07/22/17 1506 07/23/17 0617 07/24/17 0927 07/25/17 0513 07/26/17 0647  NA 139 142 140 142 142  K 4.3 4.4 4.1 4.1 4.2  CL 108 111 112* 112* 111  CO2 17* 18* 18* 19* 20*  GLUCOSE 196* 108* 157* 96 124*  BUN 58* 57* 39* 27* 20  CREATININE 2.97* 2.05* 1.12 0.97 0.93  CALCIUM 8.8* 8.7* 8.5* 8.5* 8.7*  AST 15  --   --   --   --   ALT 12*  --   --   --   --   ALKPHOS 54  --   --   --   --  BILITOT 0.6  --   --   --   --    ------------------------------------------------------------------------------------------------------------------ No results for input(s): CHOL, HDL, LDLCALC, TRIG, CHOLHDL, LDLDIRECT in the last 72 hours.  Lab Results  Component Value Date   HGBA1C 7.3 (H) 06/21/2017   ------------------------------------------------------------------------------------------------------------------ No results for input(s): TSH, T4TOTAL, T3FREE, THYROIDAB in the last 72 hours.  Invalid input(s):  FREET3 ------------------------------------------------------------------------------------------------------------------ No results for input(s): VITAMINB12, FOLATE, FERRITIN, TIBC, IRON, RETICCTPCT in the last 72 hours.  Coagulation profile Recent Labs  Lab 07/28/17 1244  INR 1.17    No results for input(s): DDIMER in the last 72 hours.  Cardiac Enzymes Recent Labs  Lab 07/22/17 2243 07/23/17 0617 07/23/17 1054  TROPONINI 0.03* 0.03* <0.03   ------------------------------------------------------------------------------------------------------------------    Component Value Date/Time   BNP 79.0 07/22/2017 1507    Inpatient Medications  Scheduled Meds: . acidophilus  1 capsule Oral BID  . docusate sodium  100 mg Oral BID  . [START ON 07/09/2017] heparin  5,000 Units Subcutaneous Q8H  . insulin aspart  0-5 Units Subcutaneous QHS  . insulin aspart  0-9 Units Subcutaneous TID WC  . labetalol  200 mg Oral Q8H  . linaclotide  145 mcg Oral QAC breakfast  . mouth rinse  15 mL Mouth Rinse BID  . multivitamin with minerals  1 tablet Oral Daily  . omega-3 acid ethyl esters  1 g Oral BID  . pantoprazole  40 mg Oral Daily  . pravastatin  80 mg Oral q1800  . senna  2 tablet Oral Daily  . tamsulosin  0.4 mg Oral QPC supper   Continuous Infusions: PRN Meds:.acetaminophen **OR** acetaminophen, HYDROcodone-acetaminophen, ondansetron **OR** ondansetron (ZOFRAN) IV, polyethylene glycol  Micro Results Recent Results (from the past 240 hour(s))  Gram stain     Status: None   Collection Time: 07/22/17  4:20 PM  Result Value Ref Range Status   Specimen Description THORACENTESIS  Final   Special Requests NONE  Final   Gram Stain   Final    CYTOSPIN SMEAR NO ORGANISMS SEEN WBC PRESENT,BOTH PMN AND MONONUCLEAR Performed at [facility], [address] [place], [place] [address]    Report Status 07/22/2017 FINAL  Final  Acid Fast Smear (AFB)     Status: None   Collection Time:  07/22/17  4:20 PM  Result Value Ref Range Status   AFB Specimen Processing Concentration  Final   Acid Fast Smear Negative  Final    Comment: (NOTE) Performed At: [facility] [address] [place], [place] [id number] Rush Farmer MD [id number]    Source (AFB) PLEURAL  Final    Comment: Performed at [facility], [address], [place], [place] [address]  Culture, body fluid-bottle     Status: None   Collection Time: 07/22/17  4:20 PM  Result Value Ref Range Status   Specimen Description PLEURAL  Final   Special Requests BOTTLES DRAWN AEROBIC AND ANAEROBIC 10CC  Final   Culture   Final    NO GROWTH 5 DAYS Performed at [facility], [address], [place], [place] [address]    Report Status 07/27/2017 FINAL  Final  MRSA PCR Screening     Status: None   Collection Time: 07/22/17  9:22 PM  Result Value Ref Range Status   MRSA by PCR NEGATIVE NEGATIVE Final    Comment:        The GeneXpert MRSA Assay (FDA approved for NASAL specimens only), is one component of a comprehensive MRSA colonization  surveillance program. It is not intended to diagnose MRSA infection nor to guide or monitor treatment for MRSA infections. Performed at [facility], [address], [place], [place] [address]   Urine culture     Status: None   Collection Time: 07/23/17 12:17 AM  Result Value Ref Range Status   Specimen Description   Final    URINE, CLEAN CATCH Performed at [facility], [address] [place], [place] [address]    Special Requests   Final    NONE Performed at [facility], [address] [place], [place] [address]    Culture   Final    NO GROWTH Performed at [facility], [place] [address], [place], [place] [address]    Report Status 07/24/2017 FINAL  Final    Radiology Reports Dg Chest 1 View  Result Date: 07/25/2017 CLINICAL DATA:  RIGHT pleural effusion post thoracentesis EXAM: CHEST  1 VIEW COMPARISON:  07/22/2017 FINDINGS: Moderate RIGHT pleural  effusion only slightly decreased from previous exam despite removal of 1.7 L of fluid. Persistent atelectasis of the mid to lower RIGHT lung. Heart remains enlarged with postsurgical changes of CABG. Mild LEFT basilar atelectasis. Atherosclerotic calcification aorta. No pneumothorax. IMPRESSION: No pneumothorax following RIGHT thoracentesis. Persistent moderate pleural effusion despite removal of 1.7 L of fluid. Electronically Signed   By: Lavonia Dana M.D.   On: 07/25/2017 14:00   Dg Chest 1 View  Result Date: 07/22/2017 CLINICAL DATA:  RIGHT pleural effusion post thoracentesis with removal of 1.05 L of fluid from the RIGHT chest EXAM: CHEST  1 VIEW COMPARISON:  Earlier study of 07/22/2017 FINDINGS: Enlargement of cardiac silhouette post CABG. Atherosclerotic calcification aorta. Persistent large RIGHT pleural effusion with significant atelectasis of the mid to lower RIGHT lung. Minimal LEFT basilar atelectasis. No pneumothorax. Osseous structures unremarkable. IMPRESSION: Persistent large RIGHT pleural effusion and significant atelectasis of the mid to lower RIGHT lung despite thoracentesis. No pneumothorax. Electronically Signed   By: Lavonia Dana M.D.   On: 07/22/2017 16:55   Dg Chest 2 View  Result Date: 07/22/2017 CLINICAL DATA:  Acutely elevated BUN and white blood cell count. Headache today. EXAM: CHEST - 2 VIEW COMPARISON:  Single-view of the chest 04/05/2015. FINDINGS: The patient has a large right pleural effusion with extensive airspace disease. The left lung is clear. There is cardiomegaly. The patient is status post CABG. Atherosclerosis is noted. IMPRESSION: Large right pleural effusion and associated airspace disease which cannot be definitively characterized. Cardiomegaly. Electronically Signed   By: Inge Rise M.D.   On: 07/22/2017 15:26   Ct Chest Wo Contrast  Result Date: 07/22/2017 CLINICAL DATA:  RIGHT pleural effusion EXAM: CT CHEST WITHOUT CONTRAST TECHNIQUE: Multidetector CT  imaging of the chest was performed following the standard protocol without IV contrast. Sagittal and coronal MPR images reconstructed from axial data set. IV contrast not utilized due to renal dysfunction. COMPARISON:  Chest radiograph 07/22/2017 FINDINGS: Cardiovascular: Atherosclerotic calcifications aorta, proximal great vessels and coronary arteries. Aorta normal caliber. No pericardial effusion. Mediastinum/Nodes: Esophagus unremarkable. Base of cervical region normal appearance. Enlarged precarinal lymph node 15 mm short axis image 55. Additional scattered normal sized mediastinal nodes. Hilar assessment limited by lack of IV contrast but RIGHT hilum appears slightly enlarged adenopathy questioned. Lungs/Pleura: Large RIGHT pleural effusion. Complete atelectasis of RIGHT lower lobe. Complete atelectasis of RIGHT middle lobe. Partial atelectasis of RIGHT upper lobe. Abnormal soft tissue density at the anteromedial mid RIGHT chest highly suspicious for pleural based tumor. This  measures up to 5.1 x 3.5 cm image 67 extends approximately 6.1 cm craniocaudal length. This abuts both the anterior chest wall and the mediastinum. Questionable area of minimal pleural thickening at the inferomedial RIGHT hemithorax. 9 mm LEFT mid lung nodule at major fissure image 73. Question additional midlung nodule 6 mm diameter image 70. Tiny calcified granuloma LEFT upper lobe. Question additional 5 mm LEFT upper lobe nodule. Tiny RIGHT upper lobe pulmonary nodules sagittal images 48 and 54. Upper Abdomen: Unremarkable prior median sternotomy. Musculoskeletal: No acute osseous findings. IMPRESSION: Large RIGHT pleural effusion with complete atelectasis of the RIGHT middle and RIGHT lower lobes and partial atelectasis of the RIGHT upper lobe. 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 BILATERAL lung nodules. a Extensive atherosclerotic calcifications including coronary  arteries. Aortic Atherosclerosis (ICD10-I70.0). Electronically Signed   By: Lavonia Dana M.D.   On: 07/22/2017 19:09   US Abdomen Complete  Result Date: 07/22/2017 CLINICAL DATA:  Elevated BUN and creatinine, does not feel good, RIGHT pleural effusion by chest radiograph EXAM: ABDOMEN ULTRASOUND COMPLETE COMPARISON:  CT abdomen and pelvis 02/02/2015 FINDINGS: Gallbladder: Shadowing gallstone 13 mm diameter within gallbladder. No gallbladder wall thickening, pericholecystic fluid or sonographic Murphy sign. Common bile duct: Diameter: Normal caliber 3 mm diameter Liver: Upper normal hepatic echogenicity. No discrete hepatic mass lesion. Portal vein is patent on color Doppler imaging with normal direction of blood flow towards the liver. IVC: Normal appearance Pancreas: Visualized portion of pancreatic body and proximal tail normal appearance, remainder obscured by bowel gas Spleen: Inadequately visualized due to high subcostal position Right Kidney: Length: 10.9 cm. Normal morphology without mass or hydronephrosis. Left Kidney: Length: 10.6 cm. Normal morphology without mass or hydronephrosis. Abdominal aorta: Normal caliber at proximal and mid portions, bifurcation obscured by bowel gas Other findings: No free fluid IMPRESSION: Cholelithiasis without evidence acute cholecystitis. Incomplete visualization of pancreas, spleen, and distal aorta. Electronically Signed   By: Lavonia Dana M.D.   On: 07/22/2017 16:39   Ir Guided Niel Hummer W Catheter Placement  Result Date: 07/29/2017 INDICATION: Lung cancer, malignant right pleural effusion EXAM: ULTRASOUND AND FLUOROSCOPIC RIGHT CHEST TUNNELED PLEURAL DRAIN (PLEURX CATHETER) MEDICATIONS: ANCEF 2 G ADMINISTERED WITHIN 1 HOUR OF THE PROCEDURE ANESTHESIA/SEDATION: Fentanyl 50 mcg IV; Versed 2.0 mg IV Moderate Sedation Time:  16 minutes The patient was continuously monitored during the procedure by the interventional radiology nurse under my direct supervision.  COMPLICATIONS: None immediate. PROCEDURE: Informed written consent was obtained from the patient after a thorough discussion of the procedural risks, benefits and alternatives. All questions were addressed. Maximal Sterile Barrier Technique was utilized including caps, mask, sterile gowns, sterile gloves, sterile drape, hand hygiene and skin antiseptic. A timeout was performed prior to the initiation of the procedure. Previous imaging reviewed. Patient positioned right anterior oblique. Right chest was ultrasounded. Large right effusion was localized in the mid axillary line through a lower intercostal space. Overlying skin marked. Under sterile conditions and local anesthesia, ultrasound percutaneous access performed of the right chest. There was return of bloody pleural fluid. Guidewire inserted. Under sterile conditions and local anesthesia, the PleurX catheter was tunneled subcutaneously to the puncture site and advanced into the pleural space through a valve peel-away sheath. Position confirmed with ultrasound and fluoroscopy. Images obtained for documentation. 1.6 L thoracentesis performed after insertion. Catheter secured with a Prolene suture. Entry site closed with subcuticular Vicryl suture and Dermabond. Sterile dressing applied. No immediate complication.  Patient tolerated the procedure well. IMPRESSION: Successful ultrasound and fluoroscopic right chest tunneled pleural drain (PleurX catheter). 1.6 L right thoracentesis performed after the insertion. Electronically Signed   By: Jerilynn Mages.  Shick M.D.   On: 07/29/2017 11:57   US Thoracentesis Asp Pleural Space W/img Guide  Result Date: 07/25/2017 INDICATION: Recurrent RIGHT pleural effusion EXAM: ULTRASOUND GUIDED THERAPEUTIC RIGHT THORACENTESIS MEDICATIONS: None. COMPLICATIONS: None immediate. PROCEDURE: Procedure, benefits, and risks of procedure were discussed with patient. Written informed consent for procedure was obtained. Time out protocol followed.  Pleural effusion localized by ultrasound at the posterior RIGHT hemithorax. Skin prepped and draped in usual sterile fashion. Skin and soft tissues anesthetized with 10 mL of 1% lidocaine. 8 French thoracentesis catheter placed into the RIGHT pleural space. 1.7 L of dark old bloody fluid aspirated by syringe pump. Procedure tolerated well by patient without immediate complication. FINDINGS: As above IMPRESSION: Successful ultrasound guided RIGHT thoracentesis yielding 1.7 L of dark old bloody pleural fluid. Electronically Signed   By: Lavonia Dana M.D.   On: 07/25/2017 13:59   US Thoracentesis Asp Pleural Space W/img Guide  Result Date: 07/22/2017 INDICATION: Large RIGHT pleural effusion EXAM: ULTRASOUND GUIDED DIAGNOSTIC AND THERAPEUTIC RIGHT THORACENTESIS MEDICATIONS: None COMPLICATIONS: None immediate. PROCEDURE: Procedure, benefits, and risks of procedure were discussed with patient. Written informed consent for procedure was obtained. Time out protocol followed. Pleural effusion localized by ultrasound at the posterior RIGHT hemithorax. Skin prepped and draped in usual sterile fashion. Skin and soft tissues anesthetized with 10 mL of 1% lidocaine. 8 French thoracentesis catheter placed into the RIGHT pleural space. 1.05 L of dark old bloody fluid aspirated by syringe and vacuum bottle suction. Procedure tolerated well by patient without immediate complication. FINDINGS: A total of approximately 1.05 L of RIGHT pleural fluid was removed. Samples were sent to the laboratory as requested by the clinical team. IMPRESSION: Successful ultrasound guided RIGHT thoracentesis yielding 1.05 L of pleural fluid. Electronically Signed   By: Lavonia Dana M.D.   On: 07/22/2017 16:46    Time Spent in minutes  25   Vaness Jelinski M.D on 07/29/2017 at 2:36 PM  Between 7am to 7pm - Pager - [phone]  After 7pm go to [email or web address] - password [facility]  [facility] -  Office  [phone]

## 2017-07-29 NOTE — Progress Notes (Signed)
Returned from [facility] pleurx drain placement.  Alert and oriented.  Drain site gauze dry and intact with tegaderm.  Patient thirsty and hungry.  Texted Dr. Clementeen Graham

## 2017-07-29 NOTE — [place] FL2 (Signed)
[place] MEDICAID FL2 LEVEL OF CARE SCREENING TOOL     IDENTIFICATION  Patient Name: [name] Birthdate: [birth year] Sex: male Admission Date (Current Location): 07/22/2017  [facility] [place] Number:  [employer] and Address:  [facility] [address] [place]      Provider Number: [id number]  Attending Physician Name and Address:  Louellen Molder, MD  Relative Name and Phone Number:       Current Level of Care: Hospital Recommended Level of Care: [place] Prior Approval Number:    Date Approved/Denied:   PASRR Number:    Discharge Plan: SNF    Current Diagnoses: Patient Active Problem List   Diagnosis Date Noted  . Malignant pleural effusion   . Encounter for hospice care discussion   . DNR (do not resuscitate) discussion   . Palliative care by specialist   . Goals of care, counseling/discussion   . Pleural effusion on right 07/22/2017  . Pulmonary mass 07/22/2017  . Lung nodules 07/22/2017  . Recurrent right pleural effusion 07/22/2017  . UTI (urinary tract infection) 07/22/2017  . DM (diabetes mellitus) with peripheral vascular complication ([place]) 71/24/5809  . Anemia due to chronic blood loss 03/26/2015  . Constipation 03/06/2015  . AKI (acute kidney injury) ([place]) 03/06/2015  . Elevated troponin 03/06/2015  . Iron deficiency anemia 04/09/2013  . COPD (chronic obstructive pulmonary disease) ([place]) 11/18/2009  . HLD (hyperlipidemia) 11/18/2009  . Hypertension 11/18/2009  . INTERMEDIATE CORONARY SYNDROME 11/18/2009  . Coronary atherosclerosis 11/18/2009  . PVD 11/18/2009  . History of cardioembolic cerebrovascular accident (CVA) 11/18/2009    Orientation RESPIRATION BLADDER Height & Weight     Self, Situation, Place  O2(see dc summary) Incontinent Weight: 168 lb 14 oz (76.6 kg) Height:  5\' 9"  (175.3 cm)  BEHAVIORAL SYMPTOMS/MOOD NEUROLOGICAL BOWEL NUTRITION STATUS      Continent Diet(see dc  summary)  AMBULATORY STATUS COMMUNICATION OF NEEDS Skin   Extensive Assist Verbally Normal                       Personal Care Assistance Level of Assistance  Bathing, Feeding, Dressing Bathing Assistance: Limited assistance Feeding assistance: Limited assistance Dressing Assistance: Limited assistance     Functional Limitations Info  Sight, Hearing, Speech Sight Info: Adequate Hearing Info: Adequate Speech Info: Adequate    SPECIAL CARE FACTORS FREQUENCY                       Contractures Contractures Info: Not present    Additional Factors Info  Code Status, Allergies Code Status Info: DNR Allergies Info: No Known Allergies           Current Medications (07/29/2017):  This is the current hospital active medication list Current Facility-Administered Medications  Medication Dose Route Frequency Provider Last Rate Last Dose  . acetaminophen (TYLENOL) tablet 650 mg  650 mg Oral Q6H PRN Opyd, Ilene Qua, MD   650 mg at 07/24/17 9833   Or  . acetaminophen (TYLENOL) suppository 650 mg  650 mg Rectal Q6H PRN Opyd, Ilene Qua, MD      . acidophilus (RISAQUAD) capsule 1 capsule  1 capsule Oral BID Opyd, Ilene Qua, MD   1 capsule at 07/28/17 2145  . docusate sodium (COLACE) capsule 100 mg  100 mg Oral BID Vianne Bulls, MD   100 mg at 07/28/17 2145  . [START ON 07/13/2017] heparin injection 5,000 Units  5,000  Units Subcutaneous Q8H Monia Sabal, [place]      . HYDROcodone-acetaminophen (NORCO/VICODIN) 5-325 MG per tablet 1-2 tablet  1-2 tablet Oral Q4H PRN Opyd, Ilene Qua, MD   1 tablet at 07/23/17 1416  . insulin aspart (novoLOG) injection 0-5 Units  0-5 Units Subcutaneous QHS Rama, Christina P, MD      . insulin aspart (novoLOG) injection 0-9 Units  0-9 Units Subcutaneous TID WC Rama, Venetia Maxon, MD   2 Units at 07/28/17 1832  . labetalol (NORMODYNE) tablet 200 mg  200 mg Oral Q8H Opyd, Ilene Qua, MD   200 mg at 07/28/17 2145  . linaclotide (LINZESS) capsule 145 mcg   145 mcg Oral QAC breakfast Vianne Bulls, MD   145 mcg at 07/28/17 0940  . MEDLINE mouth rinse  15 mL Mouth Rinse BID Opyd, Ilene Qua, MD   15 mL at 07/28/17 1259  . multivitamin with minerals tablet 1 tablet  1 tablet Oral Daily Opyd, Ilene Qua, MD   1 tablet at 07/28/17 0940  . omega-3 acid ethyl esters (LOVAZA) capsule 1 g  1 g Oral BID Opyd, Ilene Qua, MD   1 g at 07/28/17 2145  . ondansetron (ZOFRAN) tablet 4 mg  4 mg Oral Q6H PRN Opyd, Ilene Qua, MD       Or  . ondansetron (ZOFRAN) injection 4 mg  4 mg Intravenous Q6H PRN Opyd, Ilene Qua, MD      . pantoprazole (PROTONIX) EC tablet 40 mg  40 mg Oral Daily Opyd, Ilene Qua, MD   40 mg at 07/28/17 0940  . polyethylene glycol (MIRALAX / GLYCOLAX) packet 17 g  17 g Oral Daily PRN Opyd, Ilene Qua, MD      . pravastatin (PRAVACHOL) tablet 80 mg  80 mg Oral q1800 Opyd, Ilene Qua, MD   80 mg at 07/28/17 1833  . senna (SENOKOT) tablet 17.2 mg  2 tablet Oral Daily Opyd, Ilene Qua, MD   17.2 mg at 07/28/17 0939  . tamsulosin (FLOMAX) capsule 0.4 mg  0.4 mg Oral QPC supper Opyd, Ilene Qua, MD   0.4 mg at 07/28/17 1833   Facility-Administered Medications Ordered in Other Encounters  Medication Dose Route Frequency Provider Last Rate Last Dose  . ceFAZolin (ANCEF) IVPB 2g/100 mL premix  2 g Intravenous Once Greggory Keen, MD 200 mL/hr at 07/29/17 1115 2 g at 07/29/17 1115  . fentaNYL (SUBLIMAZE) injection   Intravenous PRN Greggory Keen, MD   25 mcg at 07/29/17 1118  . midazolam (VERSED) injection   Intravenous PRN Greggory Keen, MD   1 mg at 07/29/17 1118     Discharge Medications: Please see discharge summary for a list of discharge medications.  Relevant Imaging Results:  Relevant Lab Results:   Additional Information    Shade Flood, LCSW

## 2017-07-29 NOTE — Care Management Important Message (Signed)
Important Message  Patient Details  Name: [name] MRN: [id number] Date of Birth: 1935/04/18   Medicare Important Message Given:  Yes    Shelda Altes 07/29/2017, 11:47 AM

## 2017-07-29 NOTE — Sedation Documentation (Signed)
Patient denies pain and is resting comfortably.  

## 2017-07-29 NOTE — Procedures (Signed)
Rt malignant pleural eff  S/p RT CHEST PLEURX DRAIN AND 1.6 L THORACENTESIS  NO Comp Stable EBL 0 Use pleurx prn Full report in pacs

## 2017-07-29 NOTE — Clinical Social Work Note (Signed)
Clinical Social Work Assessment  Patient Details  Name: [name] MRN: [id number] Date of Birth: 1935-03-29  Date of referral:  07/29/17               Reason for consult:  Discharge Planning                Permission sought to share information with:    Permission granted to share information::     Name::        Agency::     Relationship::     Contact Information:     Housing/Transportation Living arrangements for the past 2 months:  [place] of Information:  Facility Patient Interpreter Needed:  None Criminal Activity/Legal Involvement Pertinent to Current Situation/Hospitalization:  No - Comment as needed Significant Relationships:  Adult Children Lives with:  Facility Resident Do you feel safe going back to the place where you live?  Yes Need for family participation in patient care:  Yes (Comment)  Care giving concerns:  Pt resides at [facility]   Social Worker assessment / plan: Pt is an 83 year old male admitted from [facility]. Pt is a LTC resident there. Pt will be returning to [facility] at dc. Palliative Care has consulted and pt may return to the SNF with hospice referral. MD can order at dc and SNF will refer. Updated Kerri at [facility] today. They will accept pt back at dc. Per MD, pt is getting plurex cath placed today and may be stable for dc over the weekend. W/E CSW can assist with pt's dc needs.  Employment status:  Retired Forensic scientist:  Medicare PT Recommendations:  Not assessed at this time Information / Referral to community resources:     Patient/Family's Response to care: Pt accepting of care.  Patient/Family's Understanding of and Emotional Response to Diagnosis, Current Treatment, and Prognosis: Pt and family appear to have a good understanding of diagnosis, treatment options, and prognosis all of which were discussed between pt/family and Palliative Care APNP. Emotional support provided to pt and family.  Emotional Assessment Appearance:   Appears stated age Attitude/Demeanor/Rapport:    Affect (typically observed):  Accepting Orientation:  Oriented to Self, Oriented to Place, Oriented to Situation Alcohol / Substance use:  Not Applicable Psych involvement (Current and /or in the community):  No (Comment)  Discharge Needs  Concerns to be addressed:  Discharge Planning Concerns Readmission within the last 30 days:  No Current discharge risk:  None Barriers to Discharge:  No Barriers Identified   Shade Flood, LCSW 07/29/2017, 11:22 AM

## 2017-07-29 NOTE — Progress Notes (Signed)
Called supervisor and he brought pleurx drainage kit from supply room  Talked to Dr. Clementeen Graham about orders for pleurx drain managment

## 2017-07-30 ENCOUNTER — Inpatient Hospital Stay
Admission: RE | Admit: 2017-07-30 | Discharge: 2017-08-31 | Disposition: E | Payer: Medicare Other | Source: Ambulatory Visit | Attending: Internal Medicine | Admitting: Internal Medicine

## 2017-07-30 DIAGNOSIS — I2510 Atherosclerotic heart disease of native coronary artery without angina pectoris: Secondary | ICD-10-CM | POA: Diagnosis not present

## 2017-07-30 DIAGNOSIS — E785 Hyperlipidemia, unspecified: Secondary | ICD-10-CM | POA: Diagnosis not present

## 2017-07-30 DIAGNOSIS — D0221 Carcinoma in situ of right bronchus and lung: Secondary | ICD-10-CM | POA: Diagnosis not present

## 2017-07-30 DIAGNOSIS — E1151 Type 2 diabetes mellitus with diabetic peripheral angiopathy without gangrene: Secondary | ICD-10-CM | POA: Diagnosis not present

## 2017-07-30 DIAGNOSIS — I739 Peripheral vascular disease, unspecified: Secondary | ICD-10-CM | POA: Diagnosis not present

## 2017-07-30 DIAGNOSIS — J90 Pleural effusion, not elsewhere classified: Secondary | ICD-10-CM | POA: Diagnosis not present

## 2017-07-30 DIAGNOSIS — J910 Malignant pleural effusion: Secondary | ICD-10-CM | POA: Diagnosis not present

## 2017-07-30 DIAGNOSIS — I10 Essential (primary) hypertension: Secondary | ICD-10-CM | POA: Diagnosis not present

## 2017-07-30 DIAGNOSIS — N179 Acute kidney failure, unspecified: Secondary | ICD-10-CM | POA: Diagnosis not present

## 2017-07-30 DIAGNOSIS — I69192 Facial weakness following nontraumatic intracerebral hemorrhage: Secondary | ICD-10-CM | POA: Diagnosis not present

## 2017-07-30 DIAGNOSIS — M6281 Muscle weakness (generalized): Secondary | ICD-10-CM | POA: Diagnosis not present

## 2017-07-30 DIAGNOSIS — I9589 Other hypotension: Secondary | ICD-10-CM | POA: Diagnosis not present

## 2017-07-30 DIAGNOSIS — C3491 Malignant neoplasm of unspecified part of right bronchus or lung: Secondary | ICD-10-CM | POA: Diagnosis not present

## 2017-07-30 DIAGNOSIS — Z7189 Other specified counseling: Secondary | ICD-10-CM | POA: Diagnosis not present

## 2017-07-30 DIAGNOSIS — N4289 Other specified disorders of prostate: Secondary | ICD-10-CM | POA: Diagnosis not present

## 2017-07-30 DIAGNOSIS — J189 Pneumonia, unspecified organism: Secondary | ICD-10-CM | POA: Diagnosis not present

## 2017-07-30 DIAGNOSIS — I639 Cerebral infarction, unspecified: Secondary | ICD-10-CM | POA: Diagnosis not present

## 2017-07-30 DIAGNOSIS — E1159 Type 2 diabetes mellitus with other circulatory complications: Secondary | ICD-10-CM | POA: Diagnosis not present

## 2017-07-30 DIAGNOSIS — B964 Proteus (mirabilis) (morganii) as the cause of diseases classified elsewhere: Secondary | ICD-10-CM | POA: Diagnosis not present

## 2017-07-30 DIAGNOSIS — D500 Iron deficiency anemia secondary to blood loss (chronic): Secondary | ICD-10-CM | POA: Diagnosis not present

## 2017-07-30 DIAGNOSIS — J449 Chronic obstructive pulmonary disease, unspecified: Secondary | ICD-10-CM | POA: Diagnosis not present

## 2017-07-30 DIAGNOSIS — N390 Urinary tract infection, site not specified: Secondary | ICD-10-CM | POA: Diagnosis not present

## 2017-07-30 DIAGNOSIS — H409 Unspecified glaucoma: Secondary | ICD-10-CM | POA: Diagnosis not present

## 2017-07-30 LAB — CBC
HEMATOCRIT: 38.9 % — AB (ref 39.0–52.0)
HEMOGLOBIN: 12.5 g/dL — AB (ref 13.0–17.0)
MCH: 26.7 pg (ref 26.0–34.0)
MCHC: 32.1 g/dL (ref 30.0–36.0)
MCV: 82.9 fL (ref 78.0–100.0)
PLATELETS: 433 10*3/uL — AB (ref 150–400)
RBC: 4.69 MIL/uL (ref 4.22–5.81)
RDW: 14.3 % (ref 11.5–15.5)
WBC: 22.1 10*3/uL — AB (ref 4.0–10.5)

## 2017-07-30 LAB — BASIC METABOLIC PANEL
ANION GAP: 13 (ref 5–15)
BUN: 33 mg/dL — ABNORMAL HIGH (ref 6–20)
CO2: 19 mmol/L — AB (ref 22–32)
Calcium: 8.3 mg/dL — ABNORMAL LOW (ref 8.9–10.3)
Chloride: 107 mmol/L (ref 101–111)
Creatinine, Ser: 1.24 mg/dL (ref 0.61–1.24)
GFR calc Af Amer: >60 mL/min (ref >60)
GFR, EST NON AFRICAN AMERICAN: 53 mL/min — AB (ref >60)
GLUCOSE: 127 mg/dL — AB (ref 65–99)
Potassium: 4.8 mmol/L (ref 3.5–5.1)
Sodium: 139 mmol/L (ref 135–145)

## 2017-07-30 LAB — GLUCOSE, CAPILLARY
GLUCOSE-CAPILLARY: 136 mg/dL — AB (ref 65–99)
Glucose-Capillary: 129 mg/dL — ABNORMAL HIGH (ref 65–99)
Glucose-Capillary: 183 mg/dL — ABNORMAL HIGH (ref 65–99)

## 2017-07-30 MED ORDER — HYDROCODONE-ACETAMINOPHEN 5-325 MG PO TABS: 1.0000 | ORAL_TABLET | Freq: Four times a day (QID) | ORAL | 0 refills | Status: AC | PRN

## 2017-07-30 MED ORDER — POLYETHYLENE GLYCOL 3350 17 G PO PACK: 17.0000 g | PACK | Freq: Every day | ORAL | 0 refills | Status: AC | PRN

## 2017-07-30 NOTE — Progress Notes (Signed)
Called report to [facility] center nurse.

## 2017-07-30 NOTE — Discharge Summary (Addendum)
Physician Discharge Summary  LINTON STOLP HBZ:[id number] DOB: 09/29/1934 DOA: 07/22/2017  PCP: [name], MD  Admit date: 07/22/2017 Discharge date: 07/20/2017  Admitted From: Skilled nursing facility Disposition: Skilled nursing facility [facility])  Recommendations for Outpatient Follow-up:  1. Follow-up with MD at SNF in 1 week. 2. Please refer patient to hospice at the facility.  (Discussed with granddaughter on the phone today and she wishes patient to be evaluated by hospice) 3. Discussed with interventional radiologist (Dr Reesa Chew) who recommends Pleurx catheter drainage as needed based on respiratory symptoms and can drain up to 1.5 L of pleural fluid per day.  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   COPD (chronic obstructive pulmonary disease) (HCC)   Hypertension   Coronary atherosclerosis   AKI (acute kidney injury) (HCC)   Elevated troponin   DM (diabetes mellitus) with peripheral vascular complication (HCC)   Iron deficiency anemia   Pulmonary mass   Lung nodules   Recurrent right pleural effusion   UTI (urinary tract infection)   Palliative care by specialist   Goals of care, counseling/discussion   Encounter for hospice care discussion  Brief narrative/HPI  Please refer to admission H&P for details, in brief, 83 year old male with history of type 2 diabetes mellitus, CAD, tobacco use with recently treated pneumonia and UTI admitted with malaise, anorexia, shortness of breath and cough.  Workup showed large right pleural effusion with a possible pleural-based mass and underwent thoracentesis x2 with cytology showing non-small cell lung cancer.Marland Kitchen  Hospital course   Principal Problem: Malignant  right-sided pleural effusion.    Cytology showing non-small cell carcinoma.  Recurrent effusion despite thoracentesis.  IR consulted and right-sided Pleurx catheter placed at [facility] on 3/29 with 1.6 L fluid removed. Palliative care and oncology consult appreciated.  Overall prognosis per oncology with his generalized deconditioning and comorbidities are poor and recommend referral to hospice.  I discussed with his granddaughter [name] who is the healthcare power of attorney.  She understands that patient has comorbidities and with his malignancy with recurrent effusion his overall prognosis is poor.  She also understands based on our discussion with the oncologist that he is a poor candidate for chemotherapy and agrees with being evaluated by hospice at the facility.  Patient needs to be referred to hospice at the facility.  Granddaughter will talk to patient's daughters regarding further goals of care including rehospitalization and comfort measures.  Pain control with short duration of as needed oxycodone (over Pleurx catheter site)   Active Problems: Chronic obstructive pulmonary disease Stable.  Continue inhalers.  Oxygen as needed.  Essential hypertension Stable.  Continue labetalol, amlodipine and lisinopril..  Coronary artery disease Continue statin.  Acute kidney injury Possibly prerenal, now improved.  Resume metformin and lisinopril.  Elevated troponin Suspect demand ischemia.  No chest pain symptoms or EKG changes.    Resume aspirin and Plavix.   Diabetes mellitus with peripheral vascular disease. A1c of 7.3.    Resume metformin.  Iron deficiency anemia Stable hemoglobin  Proteus UTI Recently treated with a course of Augmentin.  Leukocytosis Likely inflammatory/reactive.  Pleural fluid culture negative.  No further antibiotics given.      Family Communication  :  Discussed in detail with granddaughter [name] on the phone  Disposition Plan  : Return  to SNF   Consults  :  Oncology, palliative care, pulmonary, IR  Procedures  : Thoracentesis, right Pleurx catheter placement     Discharge Instructions   Allergies as of 07/17/2017   No Known Allergies     Medication List    STOP taking these medications   amoxicillin-clavulanate 500-125 MG tablet Commonly known as:  AUGMENTIN     TAKE these medications   amLODipine 5 MG tablet Commonly known as:  NORVASC Take 5 mg by mouth daily.   clopidogrel 75 MG tablet Commonly known as:  PLAVIX Take 75 mg by mouth daily.   docusate sodium 100 MG capsule Commonly known as:  COLACE Take 100 mg by mouth 2 (two) times daily.   Fish Oil 1000 MG Caps Take 1,000 mg by mouth 3 (three) times daily.   HYDROcodone-acetaminophen 5-325 MG tablet Commonly known as:  NORCO/VICODIN Take 1 tablet by mouth every 6 (six) hours as needed for moderate pain.   labetalol 200 MG tablet Commonly known as:  NORMODYNE Take 200 mg by mouth 3 (three) times daily. Reported on 10/22/2015   LINZESS 145 MCG Caps capsule Generic drug:  linaclotide Take 145 mcg by mouth daily before breakfast.   lisinopril 40 MG tablet Commonly known as:  PRINIVIL,ZESTRIL Take 40 mg by mouth daily.   metFORMIN 500 MG tablet Commonly known as:  GLUCOPHAGE Take 500 mg by mouth 2 (two) times daily with a meal.   multivitamin tablet Take 1 tablet by mouth daily.   OSCAL 500/200 D-3 PO 1 Tablet once a day by mouth . Can't be given with iron   pantoprazole 40 MG tablet Commonly known as:  PROTONIX Take 40 mg by mouth daily.   polyethylene glycol packet Commonly known as:  MIRALAX / GLYCOLAX Take 17 g by mouth daily as needed.   pravastatin 80 MG tablet Commonly known as:  PRAVACHOL Take 80 mg by mouth daily.   QC FERROUS SULFATE 325 (65 FE) MG tablet Generic drug:  ferrous sulfate Take 325 mg by mouth daily.   RISA-BID PROBIOTIC Tabs Take 1 tablet by mouth twice a day from //-//   senna 8.6 MG Tabs tablet Commonly known as:   SENOKOT Take 2 tablets (17.2 mg total) by mouth daily.   tamsulosin 0.4 MG Caps capsule Commonly known as:  FLOMAX Take 0.4 mg by mouth daily after supper.      Follow-up Information    MD at SNF in 1 week Follow up in 1 week(s).          No Known Allergies    Procedures/Studies: Dg Chest 1 View  Result Date: 07/25/2017 CLINICAL DATA:  RIGHT pleural effusion post thoracentesis EXAM: CHEST  1 VIEW COMPARISON:  07/22/2017 FINDINGS: Moderate RIGHT pleural effusion only slightly decreased from previous exam despite removal of 1.7 L of fluid. Persistent atelectasis of the mid to lower RIGHT lung. Heart remains enlarged with postsurgical changes of CABG. Mild LEFT basilar atelectasis. Atherosclerotic calcification aorta. No pneumothorax. IMPRESSION: No pneumothorax following RIGHT thoracentesis. Persistent moderate pleural effusion despite removal of 1.7 L of fluid. Electronically Signed   By: Lavonia Dana M.D.   On: 07/25/2017 14:00   Dg Chest 1 View  Result Date: 07/22/2017 CLINICAL DATA:  RIGHT pleural effusion post thoracentesis with removal of 1.05 L of fluid from the RIGHT chest EXAM: CHEST  1 VIEW COMPARISON:  Earlier study of 07/22/2017 FINDINGS: Enlargement of cardiac silhouette post CABG. Atherosclerotic calcification aorta. Persistent large RIGHT pleural effusion  with significant atelectasis of the mid to lower RIGHT lung. Minimal LEFT basilar atelectasis. No pneumothorax. Osseous structures unremarkable. IMPRESSION: Persistent large RIGHT pleural effusion and significant atelectasis of the mid to lower RIGHT lung despite thoracentesis. No pneumothorax. Electronically Signed   By: Lavonia Dana M.D.   On: 07/22/2017 16:55   Dg Chest 2 View  Result Date: 07/22/2017 CLINICAL DATA:  Acutely elevated BUN and white blood cell count. Headache today. EXAM: CHEST - 2 VIEW COMPARISON:  Single-view of the chest 04/05/2015. FINDINGS: The patient has a large right pleural effusion with extensive  airspace disease. The left lung is clear. There is cardiomegaly. The patient is status post CABG. Atherosclerosis is noted. IMPRESSION: Large right pleural effusion and associated airspace disease which cannot be definitively characterized. Cardiomegaly. Electronically Signed   By: Inge Rise M.D.   On: 07/22/2017 15:26   Ct Chest Wo Contrast  Result Date: 07/22/2017 CLINICAL DATA:  RIGHT pleural effusion EXAM: CT CHEST WITHOUT CONTRAST TECHNIQUE: Multidetector CT imaging of the chest was performed following the standard protocol without IV contrast. Sagittal and coronal MPR images reconstructed from axial data set. IV contrast not utilized due to renal dysfunction. COMPARISON:  Chest radiograph 07/22/2017 FINDINGS: Cardiovascular: Atherosclerotic calcifications aorta, proximal great vessels and coronary arteries. Aorta normal caliber. No pericardial effusion. Mediastinum/Nodes: Esophagus unremarkable. Base of cervical region normal appearance. Enlarged precarinal lymph node 15 mm short axis image 55. Additional scattered normal sized mediastinal nodes. Hilar assessment limited by lack of IV contrast but RIGHT hilum appears slightly enlarged adenopathy questioned. Lungs/Pleura: Large RIGHT pleural effusion. Complete atelectasis of RIGHT lower lobe. Complete atelectasis of RIGHT middle lobe. Partial atelectasis of RIGHT upper lobe. Abnormal soft tissue density at the anteromedial mid RIGHT chest highly suspicious for pleural based tumor. This measures up to 5.1 x 3.5 cm image 67 extends approximately 6.1 cm craniocaudal length. This abuts both the anterior chest wall and the mediastinum. Questionable area of minimal pleural thickening at the inferomedial RIGHT hemithorax. 9 mm LEFT mid lung nodule at major fissure image 73. Question additional midlung nodule 6 mm diameter image 70. Tiny calcified granuloma LEFT upper lobe. Question additional 5 mm LEFT upper lobe nodule. Tiny RIGHT upper lobe pulmonary  nodules sagittal images 48 and 54. Upper Abdomen: Unremarkable prior median sternotomy. Musculoskeletal: No acute osseous findings. IMPRESSION: Large RIGHT pleural effusion with complete atelectasis of the RIGHT middle and RIGHT lower lobes and partial atelectasis of the RIGHT upper lobe. 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 BILATERAL lung nodules. a Extensive atherosclerotic calcifications including coronary arteries. Aortic Atherosclerosis (ICD10-I70.0). Electronically Signed   By: Lavonia Dana M.D.   On: 07/22/2017 19:09   US Abdomen Complete  Result Date: 07/22/2017 CLINICAL DATA:  Elevated BUN and creatinine, does not feel good, RIGHT pleural effusion by chest radiograph EXAM: ABDOMEN ULTRASOUND COMPLETE COMPARISON:  CT abdomen and pelvis 02/02/2015 FINDINGS: Gallbladder: Shadowing gallstone 13 mm diameter within gallbladder. No gallbladder wall thickening, pericholecystic fluid or sonographic Murphy sign. Common bile duct: Diameter: Normal caliber 3 mm diameter Liver: Upper normal hepatic echogenicity. No discrete hepatic mass lesion. Portal vein is patent on color Doppler imaging with normal direction of blood flow towards the liver. IVC: Normal appearance Pancreas: Visualized portion of pancreatic body and proximal tail normal appearance, remainder obscured by bowel gas Spleen: Inadequately visualized due to high subcostal position Right Kidney: Length: 10.9 cm. Normal morphology without mass or hydronephrosis. Left  Kidney: Length: 10.6 cm. Normal morphology without mass or hydronephrosis. Abdominal aorta: Normal caliber at proximal and mid portions, bifurcation obscured by bowel gas Other findings: No free fluid IMPRESSION: Cholelithiasis without evidence acute cholecystitis. Incomplete visualization of pancreas, spleen, and distal aorta. Electronically Signed   By: Lavonia Dana M.D.   On: 07/22/2017 16:39   Ir Guided Niel Hummer W Catheter  Placement  Result Date: 07/29/2017 INDICATION: Lung cancer, malignant right pleural effusion EXAM: ULTRASOUND AND FLUOROSCOPIC RIGHT CHEST TUNNELED PLEURAL DRAIN (PLEURX CATHETER) MEDICATIONS: ANCEF 2 G ADMINISTERED WITHIN 1 HOUR OF THE PROCEDURE ANESTHESIA/SEDATION: Fentanyl 50 mcg IV; Versed 2.0 mg IV Moderate Sedation Time:  16 minutes The patient was continuously monitored during the procedure by the interventional radiology nurse under my direct supervision. COMPLICATIONS: None immediate. PROCEDURE: Informed written consent was obtained from the patient after a thorough discussion of the procedural risks, benefits and alternatives. All questions were addressed. Maximal Sterile Barrier Technique was utilized including caps, mask, sterile gowns, sterile gloves, sterile drape, hand hygiene and skin antiseptic. A timeout was performed prior to the initiation of the procedure. Previous imaging reviewed. Patient positioned right anterior oblique. Right chest was ultrasounded. Large right effusion was localized in the mid axillary line through a lower intercostal space. Overlying skin marked. Under sterile conditions and local anesthesia, ultrasound percutaneous access performed of the right chest. There was return of bloody pleural fluid. Guidewire inserted. Under sterile conditions and local anesthesia, the PleurX catheter was tunneled subcutaneously to the puncture site and advanced into the pleural space through a valve peel-away sheath. Position confirmed with ultrasound and fluoroscopy. Images obtained for documentation. 1.6 L thoracentesis performed after insertion. Catheter secured with a Prolene suture. Entry site closed with subcuticular Vicryl suture and Dermabond. Sterile dressing applied. No immediate complication. Patient tolerated the procedure well. IMPRESSION: Successful ultrasound and fluoroscopic right chest tunneled pleural drain (PleurX catheter). 1.6 L right thoracentesis performed after the  insertion. Electronically Signed   By: Jerilynn Mages.  Shick M.D.   On: 07/29/2017 11:57   US Thoracentesis Asp Pleural Space W/img Guide  Result Date: 07/25/2017 INDICATION: Recurrent RIGHT pleural effusion EXAM: ULTRASOUND GUIDED THERAPEUTIC RIGHT THORACENTESIS MEDICATIONS: None. COMPLICATIONS: None immediate. PROCEDURE: Procedure, benefits, and risks of procedure were discussed with patient. Written informed consent for procedure was obtained. Time out protocol followed. Pleural effusion localized by ultrasound at the posterior RIGHT hemithorax. Skin prepped and draped in usual sterile fashion. Skin and soft tissues anesthetized with 10 mL of 1% lidocaine. 8 French thoracentesis catheter placed into the RIGHT pleural space. 1.7 L of dark old bloody fluid aspirated by syringe pump. Procedure tolerated well by patient without immediate complication. FINDINGS: As above IMPRESSION: Successful ultrasound guided RIGHT thoracentesis yielding 1.7 L of dark old bloody pleural fluid. Electronically Signed   By: Lavonia Dana M.D.   On: 07/25/2017 13:59   US Thoracentesis Asp Pleural Space W/img Guide  Result Date: 07/22/2017 INDICATION: Large RIGHT pleural effusion EXAM: ULTRASOUND GUIDED DIAGNOSTIC AND THERAPEUTIC RIGHT THORACENTESIS MEDICATIONS: None COMPLICATIONS: None immediate. PROCEDURE: Procedure, benefits, and risks of procedure were discussed with patient. Written informed consent for procedure was obtained. Time out protocol followed. Pleural effusion localized by ultrasound at the posterior RIGHT hemithorax. Skin prepped and draped in usual sterile fashion. Skin and soft tissues anesthetized with 10 mL of 1% lidocaine. 8 French thoracentesis catheter placed into the RIGHT pleural space. 1.05 L of dark old bloody fluid aspirated by syringe and vacuum bottle suction. Procedure tolerated well by patient without immediate  complication. FINDINGS: A total of approximately 1.05 L of RIGHT pleural fluid was removed. Samples  were sent to the laboratory as requested by the clinical team. IMPRESSION: Successful ultrasound guided RIGHT thoracentesis yielding 1.05 L of pleural fluid. Electronically Signed   By: Lavonia Dana M.D.   On: 07/22/2017 16:46       Subjective: Reports breathing to be better.  No overnight events.  Discharge Exam: Vitals:   07/29/17 2047 07/17/2017 0507  BP: (!) 127/56 (!) 130/57  Pulse: 72 75  Resp: 20 20  Temp: 98.3 F (36.8 C) (!) 97.5 F (36.4 C)  SpO2: 92% 99%   Vitals:   07/29/17 1300 07/29/17 1500 07/29/17 2047 07/18/2017 0507  BP: 123/64 129/67 (!) 127/56 (!) 130/57  Pulse: 79 88 72 75  Resp: 16 17 20 20   Temp: 98.3 F (36.8 C) 97.9 F (36.6 C) 98.3 F (36.8 C) (!) 97.5 F (36.4 C)  TempSrc: Oral Axillary Oral Oral  SpO2: 98% 97% 92% 99%  Weight:      Height:        General: Elderly male not in distress, appears fatigued HEENT: Moist mucosa, supple neck Chest: Diminished but improved breath sounds over right lung with a right-sided Pleurx catheter in place   CVS: Normal S1 and S2, no murmurs GI: Soft, nondistended, nontender Musculoskeletal: Warm, no edema CNS: Alert and oriented x2, legally blind     The results of significant diagnostics from this hospitalization (including imaging, microbiology, ancillary and laboratory) are listed below for reference.     Microbiology: Recent Results (from the past 240 hour(s))  Gram stain     Status: None   Collection Time: 07/22/17  4:20 PM  Result Value Ref Range Status   Specimen Description THORACENTESIS  Final   Special Requests NONE  Final   Gram Stain   Final    CYTOSPIN SMEAR NO ORGANISMS SEEN WBC PRESENT,BOTH PMN AND MONONUCLEAR Performed at [facility], [address] [place], [place] [address]    Report Status 07/22/2017 FINAL  Final  Acid Fast Smear (AFB)     Status: None   Collection Time: 07/22/17  4:20 PM  Result Value Ref Range Status   AFB Specimen Processing Concentration  Final   Acid Fast  Smear Negative  Final    Comment: (NOTE) Performed At: [facility] [address] [place], [place] [id number] Rush Farmer MD WU:[id number]    Source (AFB) PLEURAL  Final    Comment: Performed at [facility], [address] [place], [place] [address]  Culture, body fluid-bottle     Status: None   Collection Time: 07/22/17  4:20 PM  Result Value Ref Range Status   Specimen Description PLEURAL  Final   Special Requests BOTTLES DRAWN AEROBIC AND ANAEROBIC 10CC  Final   Culture   Final    NO GROWTH 5 DAYS Performed at [facility], [address] [place], [place] [address]    Report Status 07/27/2017 FINAL  Final  MRSA PCR Screening     Status: None   Collection Time: 07/22/17  9:22 PM  Result Value Ref Range Status   MRSA by PCR NEGATIVE NEGATIVE Final    Comment:        The GeneXpert MRSA Assay (FDA approved for NASAL specimens only), is one component of a comprehensive MRSA colonization surveillance program. It is not intended to diagnose MRSA infection nor to guide or monitor treatment for MRSA infections. Performed at [facility], [place]  [address], [place], [place] [address]   Urine culture     Status: None   Collection Time: 07/23/17 12:17 AM  Result Value Ref Range Status   Specimen Description   Final    URINE, CLEAN CATCH Performed at [facility], [address], [place], [place] [address]    Special Requests   Final    NONE Performed at [facility], [address], [place], [place] [address]    Culture   Final    NO GROWTH Performed at [facility], [place] [address] [place], [place] [address]    Report Status 07/24/2017 FINAL  Final     Labs: BNP (last 3 results) Recent Labs    07/22/17 1507  BNP 90.2   Basic Metabolic Panel: Recent Labs  Lab 07/24/17 0927 07/25/17 0513 07/26/17 0647 07/23/2017 0608  NA 140 142 142 139  K 4.1 4.1 4.2 4.8  CL 112* 112* 111 107  CO2 18* 19* 20* 19*  GLUCOSE 157* 96 124* 127*  BUN 39* 27* 20 33*   CREATININE 1.12 0.97 0.93 1.24  CALCIUM 8.5* 8.5* 8.7* 8.3*   Liver Function Tests: No results for input(s): AST, ALT, ALKPHOS, BILITOT, PROT, ALBUMIN in the last 168 hours. No results for input(s): LIPASE, AMYLASE in the last 168 hours. No results for input(s): AMMONIA in the last 168 hours. CBC: Recent Labs  Lab 07/24/17 0927 07/25/17 0513 07/26/17 0647 07/25/2017 0608  WBC 17.0* 17.6* 19.3* 22.1*  HGB 11.4* 11.6* 12.7* 12.5*  HCT 35.0* 36.3* 39.3 38.9*  MCV 82.4 82.9 83.1 82.9  PLT 405* 445* 457* 433*   Cardiac Enzymes: No results for input(s): CKTOTAL, CKMB, CKMBINDEX, TROPONINI in the last 168 hours. BNP: Invalid input(s): POCBNP CBG: Recent Labs  Lab 07/29/17 1313 07/29/17 1628 07/29/17 2102 07/04/2017 0808 07/24/2017 1201  GLUCAP 127* 157* 162* 129* 136*   D-Dimer No results for input(s): DDIMER in the last 72 hours. Hgb A1c No results for input(s): HGBA1C in the last 72 hours. Lipid Profile No results for input(s): CHOL, HDL, LDLCALC, TRIG, CHOLHDL, LDLDIRECT in the last 72 hours. Thyroid function studies No results for input(s): TSH, T4TOTAL, T3FREE, THYROIDAB in the last 72 hours.  Invalid input(s): FREET3 Anemia work up No results for input(s): VITAMINB12, FOLATE, FERRITIN, TIBC, IRON, RETICCTPCT in the last 72 hours. Urinalysis    Component Value Date/Time   COLORURINE AMBER (A) 07/23/2017 0017   APPEARANCEUR CLOUDY (A) 07/23/2017 0017   LABSPEC 1.020 07/23/2017 0017   PHURINE 5.0 07/23/2017 0017   GLUCOSEU NEGATIVE 07/23/2017 0017   HGBUR NEGATIVE 07/23/2017 0017   BILIRUBINUR NEGATIVE 07/23/2017 0017   KETONESUR 5 (A) 07/23/2017 0017   PROTEINUR NEGATIVE 07/23/2017 0017   UROBILINOGEN 0.2 03/15/2015 1702   NITRITE NEGATIVE 07/23/2017 0017   LEUKOCYTESUR NEGATIVE 07/23/2017 0017   Sepsis Labs Invalid input(s): PROCALCITONIN,  WBC,  LACTICIDVEN Microbiology Recent Results (from the past 240 hour(s))  Gram stain     Status: None   Collection  Time: 07/22/17  4:20 PM  Result Value Ref Range Status   Specimen Description THORACENTESIS  Final   Special Requests NONE  Final   Gram Stain   Final    CYTOSPIN SMEAR NO ORGANISMS SEEN WBC PRESENT,BOTH PMN AND MONONUCLEAR Performed at [facility], [address], [place],  [address]    Report Status 07/22/2017 FINAL  Final  Acid Fast Smear (AFB)     Status: None   Collection Time: 07/22/17  4:20 PM  Result  Value Ref Range Status   AFB Specimen Processing Concentration  Final   Acid Fast Smear Negative  Final    Comment: (NOTE) Performed At: [facility] [address] [place], [place] [id number] Rush Farmer MD [id number]    Source (AFB) PLEURAL  Final    Comment: Performed at [facility], [address] [place], [place] [address]  Culture, body fluid-bottle     Status: None   Collection Time: 07/22/17  4:20 PM  Result Value Ref Range Status   Specimen Description PLEURAL  Final   Special Requests BOTTLES DRAWN AEROBIC AND ANAEROBIC 10CC  Final   Culture   Final    NO GROWTH 5 DAYS Performed at [facility], [address], [place], [place] [address]    Report Status 07/27/2017 FINAL  Final  MRSA PCR Screening     Status: None   Collection Time: 07/22/17  9:22 PM  Result Value Ref Range Status   MRSA by PCR NEGATIVE NEGATIVE Final    Comment:        The GeneXpert MRSA Assay (FDA approved for NASAL specimens only), is one component of a comprehensive MRSA colonization surveillance program. It is not intended to diagnose MRSA infection nor to guide or monitor treatment for MRSA infections. Performed at [facility], [address], [place], [place] [address]   Urine culture     Status: None   Collection Time: 07/23/17 12:17 AM  Result Value Ref Range Status   Specimen Description   Final    URINE, CLEAN CATCH Performed at [facility], [address], [place], [place] [address]    Special Requests   Final    NONE Performed at [facility], [address], [place], [place] [address]    Culture   Final    NO GROWTH Performed at [facility], [place] [address], [place], [place] [address]    Report Status 07/24/2017 FINAL  Final     Time coordinating discharge: Over 30 minutes  SIGNED:   Louellen Molder, MD  [facility] 07/19/2017, 2:09 PM Pager   If 7PM-7AM, please contact night-coverage [email or web address] Password [email or web address]

## 2017-07-30 NOTE — Progress Notes (Signed)
Patient will Discharge OP:[facility] Anticipated DC Date:07/03/2017 Family Notified:yes, Darlen Round, daughter, [phone] Transport By:RN will transport to [facility]   Per MD patient ready for DC to [facility] . RN, patient, patient's family, and facility notified of DC. Assessment, Fl2/Pasrr, and Discharge Summary sent to facility. RN given number for report [phone]). DC packet on chart. Ambulance transport requested for patient.   CSW signing off.  Reed Breech LCSWA [phone]

## 2017-07-30 NOTE — Discharge Instructions (Signed)
Right Pleurx catheter care Please drain catheter as needed for any associated symptoms including increased shortness of breath, diminished lung breath sounds or increase crackles.  May drain up to 1500 cc pleural fluid per day. Please contact MD at the facility if there is purulent or increased bloody drainage.  Please contact interventional radiology at [phone] if there is no drainage from the catheter site despite  symptoms.

## 2017-08-01 ENCOUNTER — Non-Acute Institutional Stay (SKILLED_NURSING_FACILITY): Payer: Medicare Other | Admitting: Internal Medicine

## 2017-08-01 ENCOUNTER — Other Ambulatory Visit (HOSPITAL_COMMUNITY): Payer: Self-pay | Admitting: Primary Care

## 2017-08-01 ENCOUNTER — Encounter: Payer: Self-pay | Admitting: Internal Medicine

## 2017-08-01 DIAGNOSIS — C3491 Malignant neoplasm of unspecified part of right bronchus or lung: Secondary | ICD-10-CM

## 2017-08-01 DIAGNOSIS — J910 Malignant pleural effusion: Secondary | ICD-10-CM

## 2017-08-01 DIAGNOSIS — I9589 Other hypotension: Secondary | ICD-10-CM

## 2017-08-01 DIAGNOSIS — E1151 Type 2 diabetes mellitus with diabetic peripheral angiopathy without gangrene: Secondary | ICD-10-CM

## 2017-08-01 NOTE — Progress Notes (Signed)
Provider: Veleta Miners  Location:   [facility] Room Number: 104/W Place of Service:  SNF (31)  PCP: [name], MD Patient Care Team: [name], MD as PCP - General (Internal Medicine) Danie Binder, MD as Consulting Physician (Gastroenterology)  Extended Emergency Contact Information Primary Emergency Contact: Dorathy Kinsman [place] of [place] Phone: [phone] Relation: Daughter Secondary Emergency Contact: Lyndon Station of [place] Phone: [phone] Relation: Daughter  Code Status: DNR Goals of Care: Advanced Directive information Advanced Directives 08/01/2017  Does Patient Have a Medical Advance Directive? Yes  Type of Advance Directive Out of facility DNR (pink MOST or yellow form)  Does patient want to make changes to medical advance directive? No - Patient declined  Copy of [place] in Chart? No - copy requested  Would patient like information on creating a medical advance directive? -  Pre-existing out of facility DNR order (yellow form or pink MOST form) -      Chief Complaint  Patient presents with  . Readmit To SNF    Readmission Visit    HPI: Patient is a 83 y.o. male seen today for Readmission to SNF for [facility].  Patient with H/O GI bleed, Diabetes type 2 ,S/P CVA , Hypertension, Hyperlipidemia, constipation. Poor vision . CAD S/P CABG in 2000 and PCI of Left Main on 2003 and Carotid artery stenosis.with complete occlusion of RCA.  Patient was admitted to the hospital from the facility with very nonspecific complaints.  He is complaining of right-sided chest pain with coughing and just not feeling well.  He had not responded to antibiotics for the right-sided infiltrate.  He had a CT scan of his chest done in the hospital which showed large right pleural effusion with pleural based tumor mass at the anterior mid chest and bilateral lung nodules.  He  had thoracocentesis done and cytology was positive for malignant cells consistent with non-small cell carcinoma.  He was evaluated by oncology and was thought to be a poor candidate for any kind of chemotherapy.  His situation was discussed with his granddaughter. He is back in the facility with plan for hospice. He does have right Pleurx catheter placed for PRN thoracocentesis.  Patient is not doing well since he has been back.  He has been complaining of pain mostly in his chest.  He looks confused since he got hydrocodone this morning.  His blood pressure was very low.  He is not eating.  And has been very anxious.  The nurses had removed a liter of fluid yesterday as he was very short of breath.  He has told the nurses that he just wants to be comfortable and he understands that he has cancer.   Past Medical History:  Diagnosis Date  . Anemia due to chronic blood loss   . CAD (coronary artery disease)    cath 11/2007: 70% LM ISR, SVG-LAD OK, SVG-D1 100%, IMA-D2 OK, SVG-OM OK, 4-vessel CABG 2000.  Marland Kitchen CAD (coronary artery disease)    s/p of 95% L main artery stenosis, 4/03: cutting balloon PTCA of 80% in-stent restenosis, 7/04. preserved L ventriculr function  . Carotid bruit    bilateral. no sigificant distal abd atherosclerosis by recent cath.   . Chronic bronchitis ([place])   . Constipation   . COPD (chronic obstructive pulmonary disease) (HCC)    ongoing tobacco   . CVA (cerebral infarction)    left sided weakness  .  DM2 (diabetes mellitus, type 2) ([place])   . Dyslipidemia   . Dysphagia   . Glaucoma   . HTN (hypertension)    Past Surgical History:  Procedure Laterality Date  . CARDIAC CATHETERIZATION    . COLONOSCOPY N/A 05/14/2015   POOR PREP  . COLONOSCOPY N/A 06/02/2015   SLF: 1. four colorectal polyps removed. no source for anemia identified. 2. the left colon is redundant 3. moderate sized internal hemorroids.   . ESOPHAGOGASTRODUODENOSCOPY N/A 05/14/2015   CHRONIC GASTRITIS  .  GIVENS CAPSULE STUDY N/A 06/17/2015   Procedure: GIVENS CAPSULE STUDY;  Surgeon: Danie Binder, MD;  Location: [facility];  Service: Endoscopy;  Laterality: N/A;  0800  . IR GUIDED DRAIN W CATHETER PLACEMENT  07/29/2017    reports that he quit smoking about 2 years ago. He has never used smokeless tobacco. He reports that he does not drink alcohol or use drugs. Social History   Socioeconomic History  . Marital status: Married    Spouse name: Not on file  . Number of children: Not on file  . Years of education: Not on file  . Highest education level: Not on file  Occupational History  . Not on file  Social Needs  . Financial resource strain: Not on file  . Food insecurity:    Worry: Not on file    Inability: Not on file  . Transportation needs:    Medical: Not on file    Non-medical: Not on file  Tobacco Use  . Smoking status: Former Smoker    Last attempt to quit: 08/03/2014    Years since quitting: 2.9  . Smokeless tobacco: Never Used  . Tobacco comment: 50 pack year hx ; not smoking @ [facility]  Substance and Sexual Activity  . Alcohol use: No    Alcohol/week: 0.0 oz    Comment: has not had alcohol in 30-40 years   . Drug use: No  . Sexual activity: Not on file  Lifestyle  . Physical activity:    Days per week: Not on file    Minutes per session: Not on file  . Stress: Not on file  Relationships  . Social connections:    Talks on phone: Not on file    Gets together: Not on file    Attends religious service: Not on file    Active member of club or organization: Not on file    Attends meetings of clubs or organizations: Not on file    Relationship status: Not on file  . Intimate partner violence:    Fear of current or ex partner: Not on file    Emotionally abused: Not on file    Physically abused: Not on file    Forced sexual activity: Not on file  Other Topics Concern  . Not on file  Social History Narrative  . Not on file    Functional Status Survey:     Family History  Problem Relation Age of Onset  . Hypertension Mother   . Stroke Maternal Grandmother   . Cancer Neg Hx   . Diabetes Neg Hx   . Heart disease Neg Hx     Health Maintenance  Topic Date Due  . INFLUENZA VACCINE  12/01/2017  . HEMOGLOBIN A1C  12/19/2017  . FOOT EXAM  01/26/2018  . OPHTHALMOLOGY EXAM  02/23/2018  . TETANUS/TDAP  01/14/2027  . PNA vac Low Risk Adult  Completed    No Known Allergies  Outpatient Encounter Medications as  of 08/01/2017  Medication Sig  . amLODipine (NORVASC) 5 MG tablet Take 5 mg by mouth daily.  . Calcium Carbonate-Vitamin D (OSCAL 500/200 D-3 PO) 1 Tablet once a day by mouth . Can't be given with iron  . clopidogrel (PLAVIX) 75 MG tablet Take 75 mg by mouth daily.  Marland Kitchen docusate sodium (COLACE) 100 MG capsule Take 100 mg by mouth 2 (two) times daily.  . ferrous sulfate (QC FERROUS SULFATE) 325 (65 FE) MG tablet Take 325 mg by mouth daily.   Marland Kitchen HYDROcodone-acetaminophen (NORCO/VICODIN) 5-325 MG tablet Take 1 tablet by mouth every 6 (six) hours as needed for moderate pain.  Marland Kitchen labetalol (NORMODYNE) 200 MG tablet Take 200 mg by mouth 3 (three) times daily. Reported on 10/22/2015  . linaclotide (LINZESS) 145 MCG CAPS capsule Take 145 mcg by mouth daily before breakfast.  . lisinopril (PRINIVIL,ZESTRIL) 40 MG tablet Take 40 mg by mouth daily.  . metFORMIN (GLUCOPHAGE) 500 MG tablet Take 500 mg by mouth 2 (two) times daily with a meal.   . Multiple Vitamin (MULTIVITAMIN) tablet Take 1 tablet by mouth daily.  . Omega-3 Fatty Acids (FISH OIL) 1000 MG CAPS Take 1,000 mg by mouth 3 (three) times daily.  . pantoprazole (PROTONIX) 40 MG tablet Take 40 mg by mouth daily.  . polyethylene glycol (MIRALAX / GLYCOLAX) packet Take 17 g by mouth daily as needed.  . pravastatin (PRAVACHOL) 80 MG tablet Take 80 mg by mouth daily.  Marland Kitchen senna (SENOKOT) 8.6 MG TABS tablet Take 2 tablets (17.2 mg total) by mouth daily.  . tamsulosin (FLOMAX) 0.4 MG CAPS capsule Take  0.4 mg by mouth daily after supper.  . [DISCONTINUED] Probiotic Product (RISA-BID PROBIOTIC) TABS Take 1 tablet by mouth twice a day from //-//   No facility-administered encounter medications on file as of 08/01/2017.      Review of Systems  Unable to perform ROS: Mental status change    Vitals:   08/01/17 1132 08/01/17 1133 08/01/17 1134  BP: (!) 82/42 (!) 86/58 106/68  Pulse: 64    Resp: 18    Temp: 98.7 F (37.1 C)    TempSrc: Oral    SpO2: 96%     There is no height or weight on file to calculate BMI. Physical Exam  Constitutional: He appears well-developed and well-nourished.  HENT:  Head: Normocephalic.  Mouth/Throat: Oropharynx is clear and moist.  Eyes: Pupils are equal, round, and reactive to light.  Neck: Neck supple.  Cardiovascular: Normal heart sounds. Bradycardia present.  Pulmonary/Chest: Effort normal. He has decreased breath sounds. He has no wheezes. He has no rales.  Abdominal: Soft. Bowel sounds are normal. He exhibits no distension. There is no tenderness. There is no rebound.  Musculoskeletal: He exhibits no edema.  Lymphadenopathy:    He has no cervical adenopathy.  Neurological: He is alert.  Looks Confused and Drowsy and restless.  Skin: Skin is warm and dry.  Psychiatric: Thought content normal. His mood appears anxious. His speech is delayed. He is slowed. Cognition and memory are impaired.    Labs reviewed: Basic Metabolic Panel: Recent Labs    07/25/17 0513 07/26/17 0647 07/03/2017 0608  NA 142 142 139  K 4.1 4.2 4.8  CL 112* 111 107  CO2 19* 20* 19*  GLUCOSE 96 124* 127*  BUN 27* 20 33*  CREATININE 0.97 0.93 1.24  CALCIUM 8.5* 8.7* 8.3*   Liver Function Tests: Recent Labs    07/12/17 1420 07/22/17 1240 07/22/17 1506  AST 16 17 15   ALT 9* 11* 12*  ALKPHOS 62 58 54  BILITOT 0.4 0.6 0.6  PROT 6.8 7.4 7.6  ALBUMIN 3.2* 3.2* 3.2*   No results for input(s): LIPASE, AMYLASE in the last 8760 hours. No results for input(s):  AMMONIA in the last 8760 hours. CBC: Recent Labs    07/22/17 1240 07/22/17 1506 07/23/17 0617  07/25/17 0513 07/26/17 0647 07/18/2017 0608  WBC 18.3* 18.5* 16.9*   < > 17.6* 19.3* 22.1*  NEUTROABS 14.2* 14.5* 12.3*  --   --   --   --   HGB 12.0* 11.1* 12.2*   < > 11.6* 12.7* 12.5*  HCT 36.6* 35.0* 37.5*   < > 36.3* 39.3 38.9*  MCV 82.1 83.1 83.1   < > 82.9 83.1 82.9  PLT 417* 422* 421*   < > 445* 457* 433*   < > = values in this interval not displayed.   Cardiac Enzymes: Recent Labs    07/22/17 2243 07/23/17 0617 07/23/17 1054  TROPONINI 0.03* 0.03* <0.03   BNP: Invalid input(s): POCBNP Lab Results  Component Value Date   HGBA1C 7.3 (H) 06/21/2017   Lab Results  Component Value Date   TSH 1.483 07/22/2017   Lab Results  Component Value Date   EKCMKLKJ17 915 01/26/2017   No results found for: FOLATE Lab Results  Component Value Date   IRON 27 (L) 11/23/2007   TIBC 363 11/23/2007   FERRITIN 31 08/31/2016    Imaging and Procedures obtained prior to SNF admission: No results found.  Assessment/Plan Advanced stage NSCLC Patient is a poor candidate for chemotherapy. He is now enrolled in hospice. He was started on morphine as needed for pain, shortness of breath, anxiety PRN Pleurx drainage as needed For his Hyportension His blood pressure running low will discontinue labetalol and lisinopril His blood his medicines were reviewed with hospice Will discontinue statin and multivitamin  Family/ staff Communication:   Labs/tests ordered:  Total time spent in this patient care encounter was 45_ minutes; greater than 50% of the visit spent counseling patient, reviewing records , Labs and coordinating care for problems addressed at this encounter.

## 2017-08-01 DEATH — deceased

## 2017-08-29 ENCOUNTER — Ambulatory Visit: Payer: Medicare Other | Admitting: Cardiovascular Disease

## 2017-08-31 DEATH — deceased

## 2017-09-03 LAB — ACID FAST CULTURE WITH REFLEXED SENSITIVITIES (MYCOBACTERIA): Acid Fast Culture: NEGATIVE

## 2017-09-03 LAB — ACID FAST CULTURE WITH REFLEXED SENSITIVITIES

## 2017-11-10 IMAGING — CT CT HEAD W/O CM
1 series · 16 of 30 positions shown, 20 images · non-contrast
Comparison: Brain MRI and head CT scans 03/05/2015.

CLINICAL DATA: Increased left facial droop. Right side weakness.
Initial encounter.

EXAM:
CT HEAD WITHOUT CONTRAST
TECHNIQUE: Contiguous axial images were obtained from the base of the skull
through the vertex without intravenous contrast.

[Series 2: headseq 4.8 h37s · axial · 0.54mm/px · z∈[+108,+263]mm · 16 of 36 slices shown, 20 images]
[im 2/36  brain]
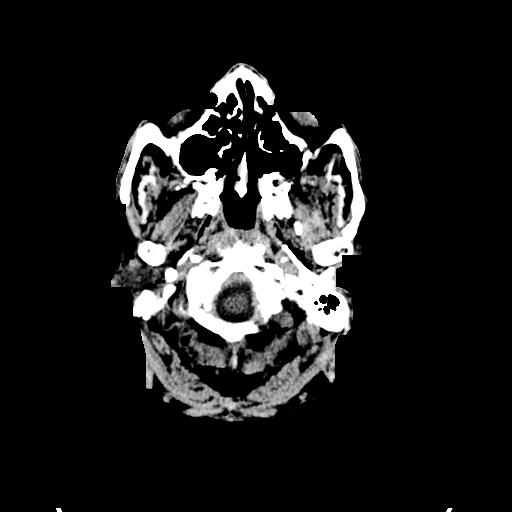
[im 2/36  bone]
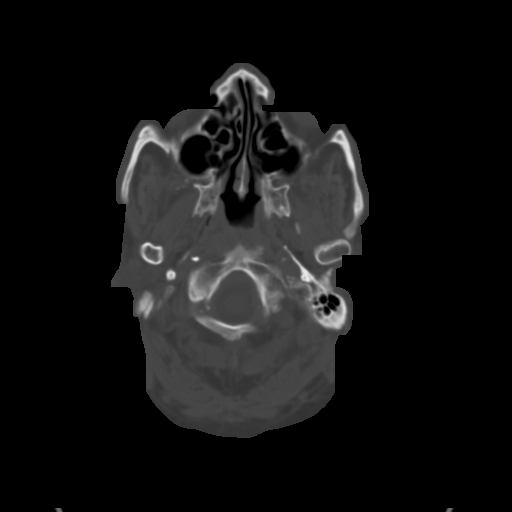
[im 4/36  brain]
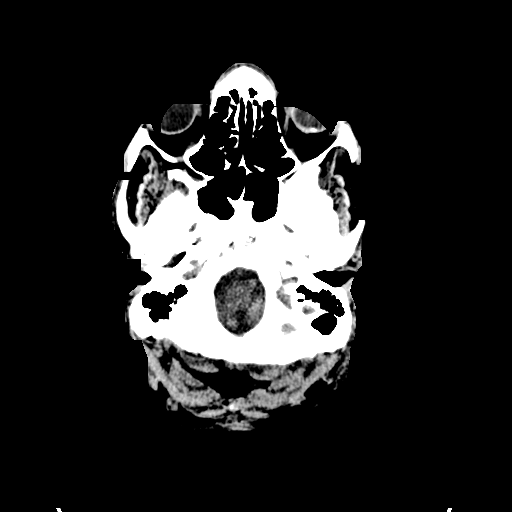
[im 7/36  brain]
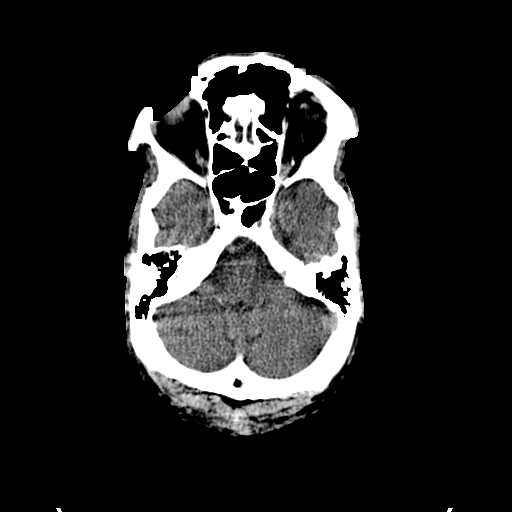
[im 9/36  brain]
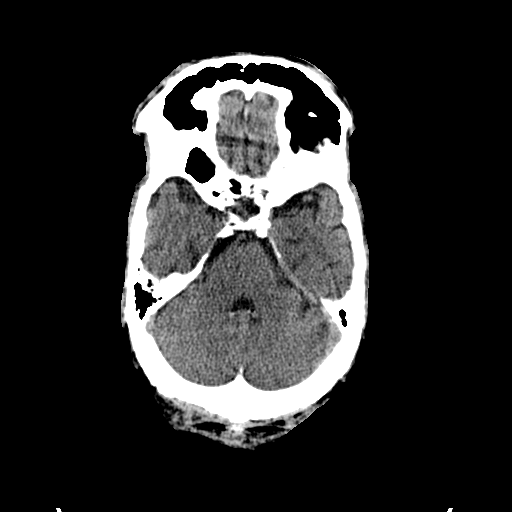
[im 10/36  brain]
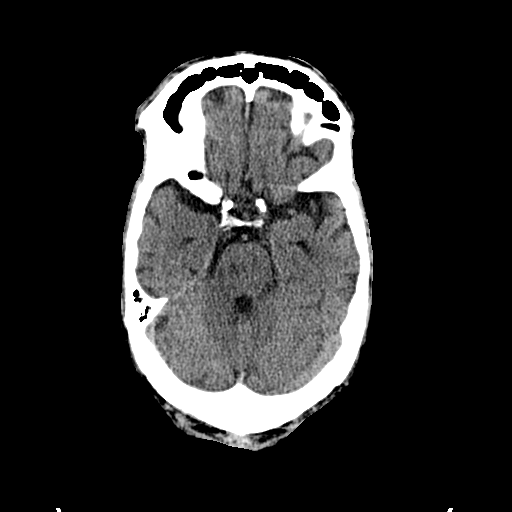
[im 10/36  bone]
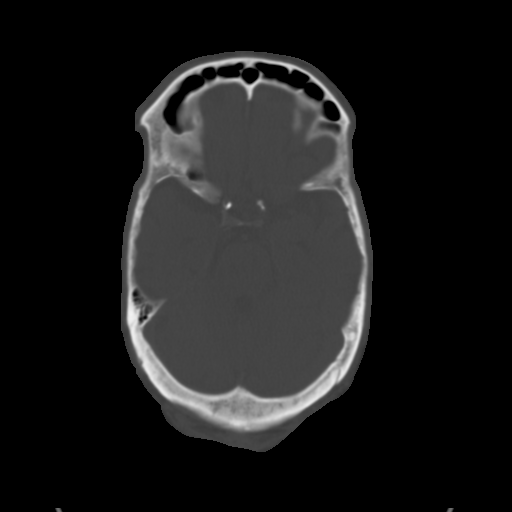
[im 13/36  brain]
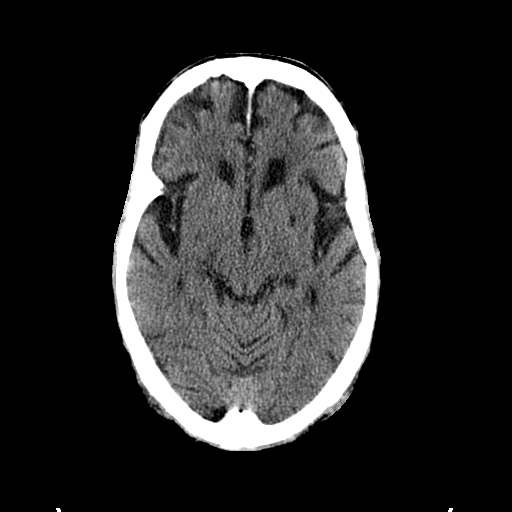
[im 15/36  brain]
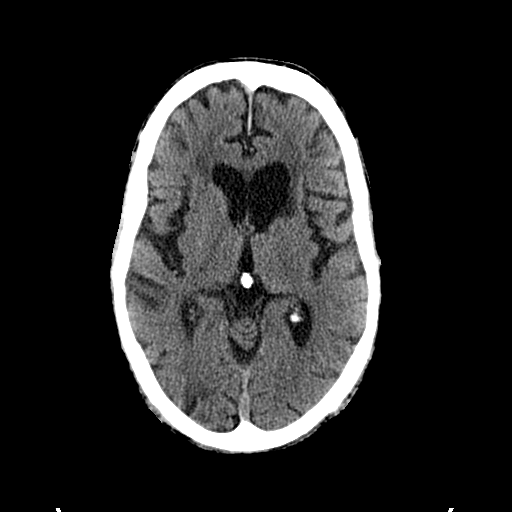
[im 17/36  brain]
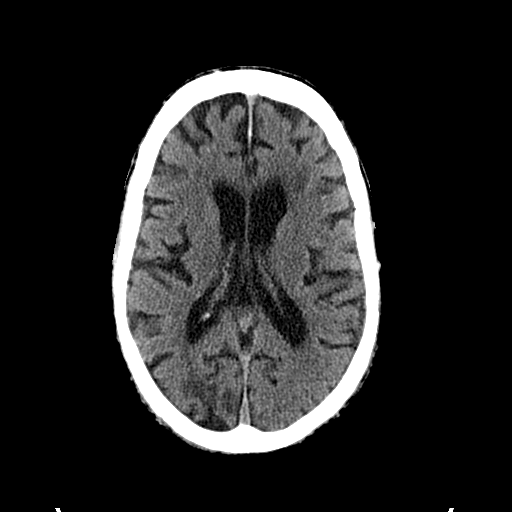
[im 19/36  brain]
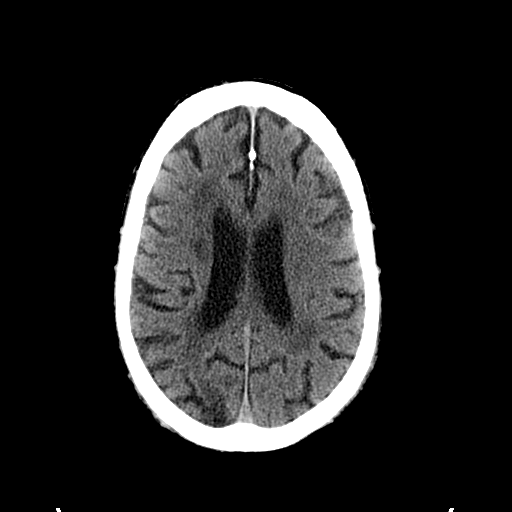
[im 19/36  bone]
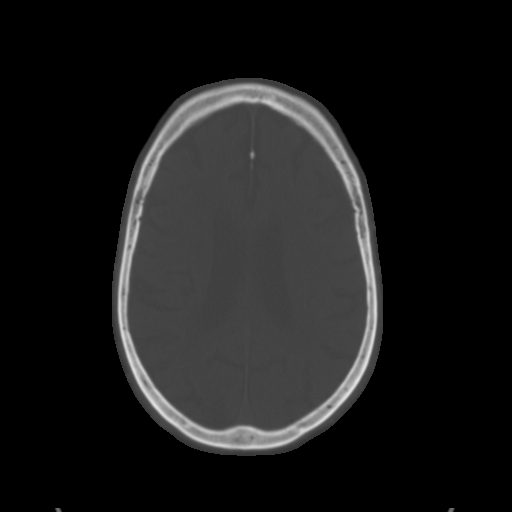
[im 21/36  brain]
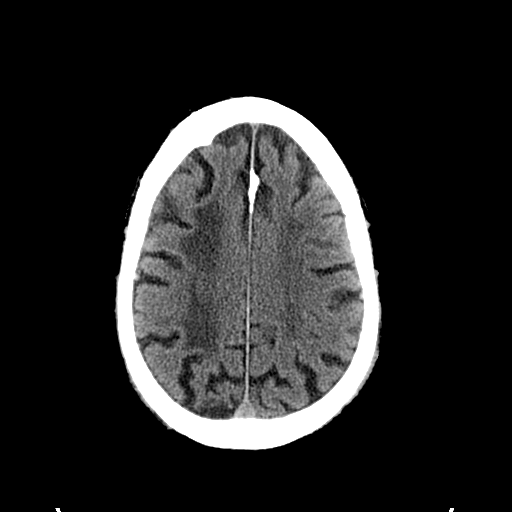
[im 23/36  brain]
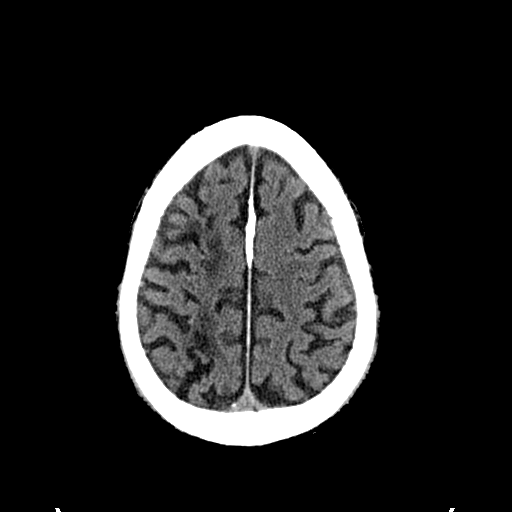
[im 26/36  brain]
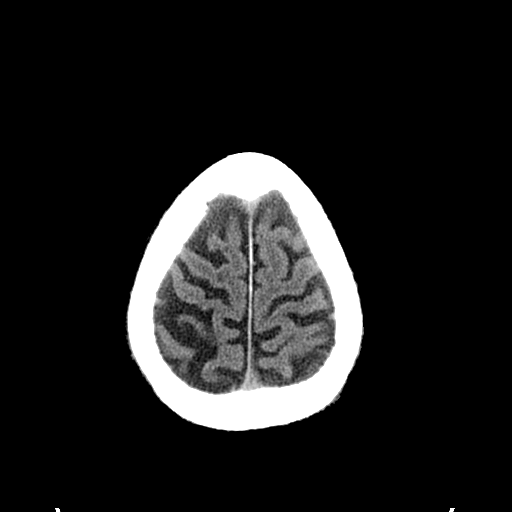
[im 27/36  brain]
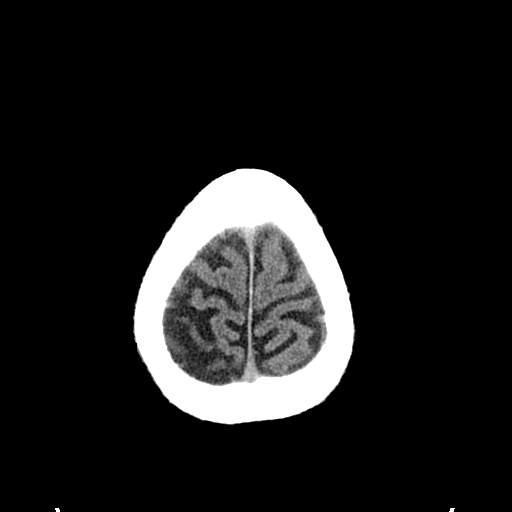
[im 27/36  bone]
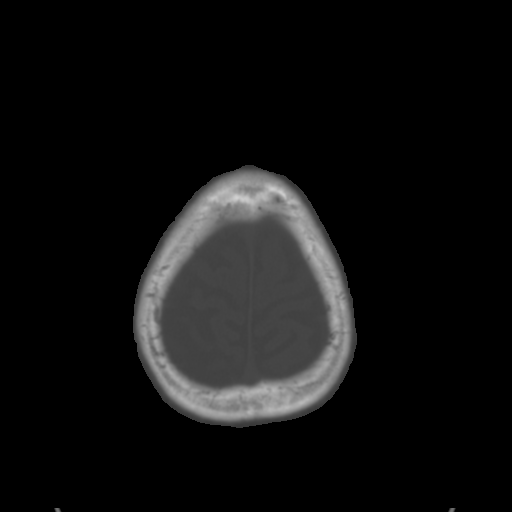
[im 29/36  brain]
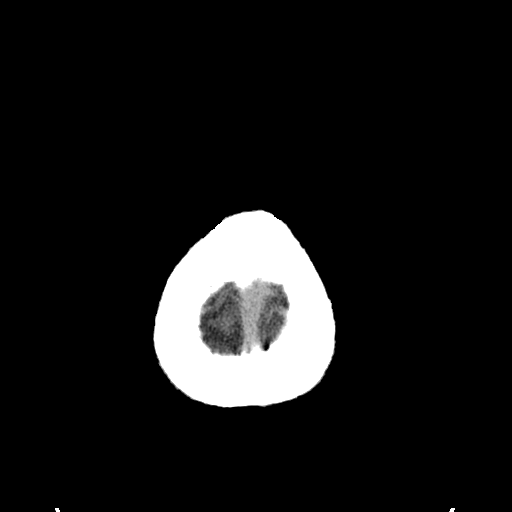
[im 32/36  brain]
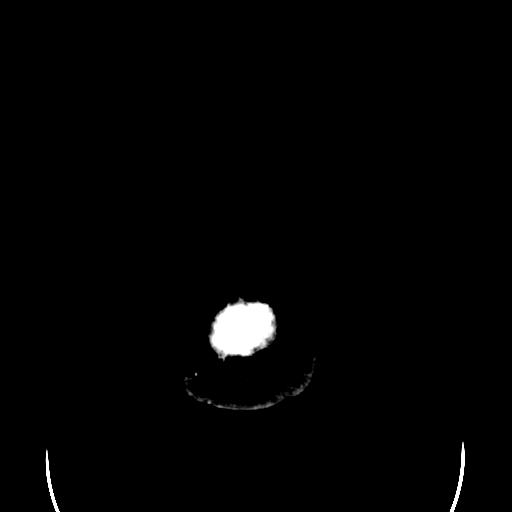
[im 34/36  brain]
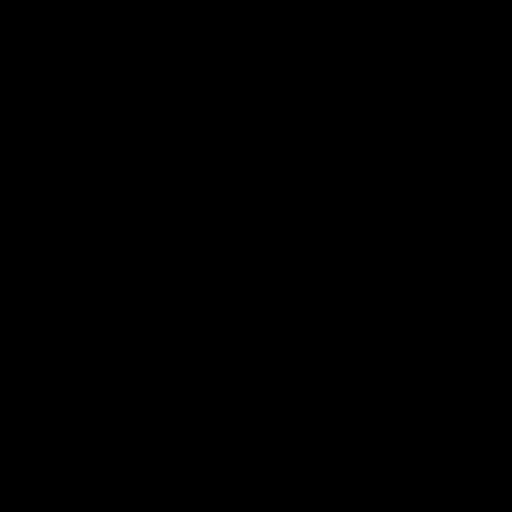

[16 of 30 positions shown; findings below may reference images not displayed]

FINDINGS: The brain is atrophic with extensive chronic microvascular ischemic
change. Remote infarctions in the right occipital lobe and basal
ganglia are again seen. No evidence of acute infarction, hemorrhage,
mass lesion, mass effect, midline shift or abnormal extra-axial
fluid collection is identified. No hydrocephalus or pneumocephalus.
The calvarium is intact.
IMPRESSION: No acute abnormality in patient with atrophy, remote infarcts and
extensive chronic microvascular ischemic change.

## 2017-12-01 IMAGING — CR DG CHEST 1V PORT
1 series · 1 of 1 positions shown · non-contrast
Comparison: 04/03/2015 and 03/30/2015.

CLINICAL DATA: Unresponsive. Pseudo seizure activity. History of
hypertension, diabetes, coronary artery disease and COPD.

EXAM:
PORTABLE CHEST 1 VIEW

[ap portable]
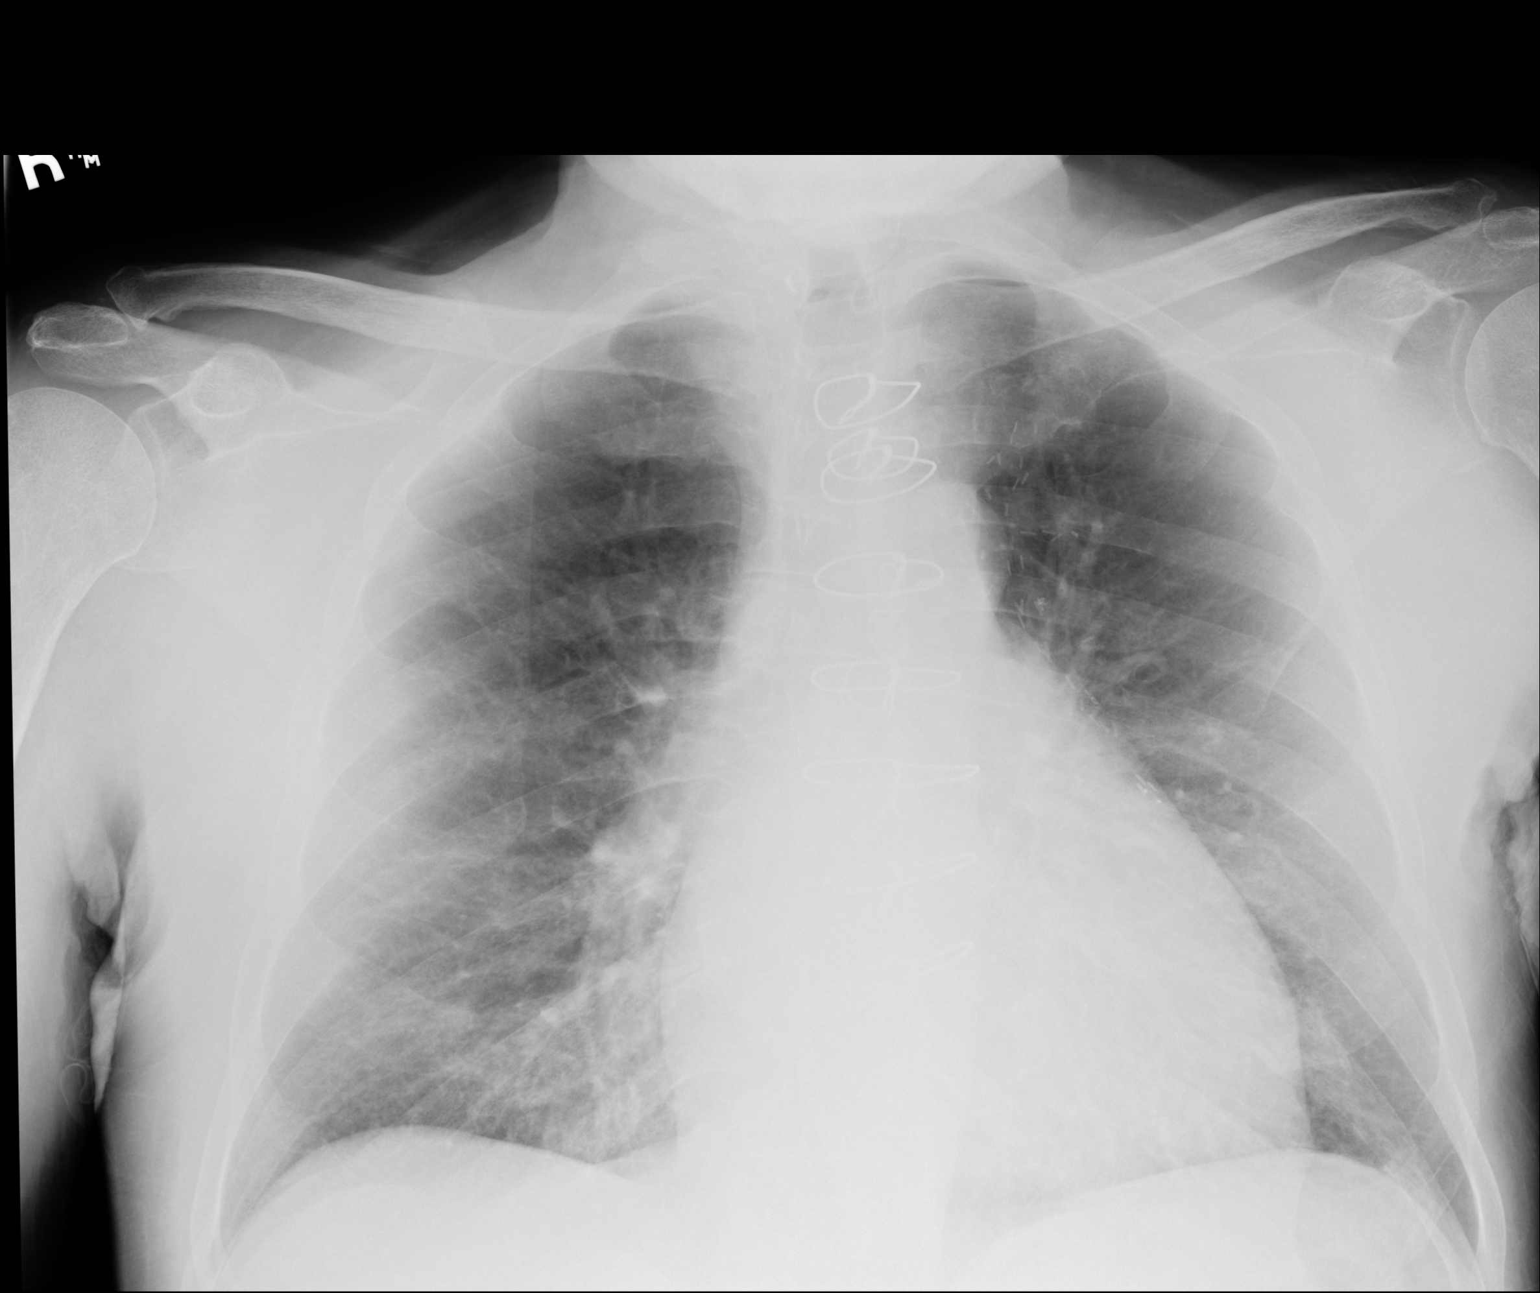

[1 of 1 positions shown; findings below may reference images not displayed]

FINDINGS: 7000 hours. The heart size and mediastinal contours are stable
status post CABG. There is stable mild atelectasis at both lung
bases superimposed on emphysema. No edema, confluent airspace
opacity or significant pleural effusion. The bones appear unchanged.
IMPRESSION: Stable chest with mild bibasilar atelectasis post CABG.

## 2020-03-22 IMAGING — US US THORACENTESIS ASP PLEURAL SPACE W/IMG GUIDE
1 series · 4 of 4 positions shown · non-contrast
Comparison: none

INDICATION: Recurrent RIGHT pleural effusion

[Series 1: us thoracentesis asp pleural space w/img guide · 0.35mm/px · 4 of 4 slices shown]
[im 1/4]
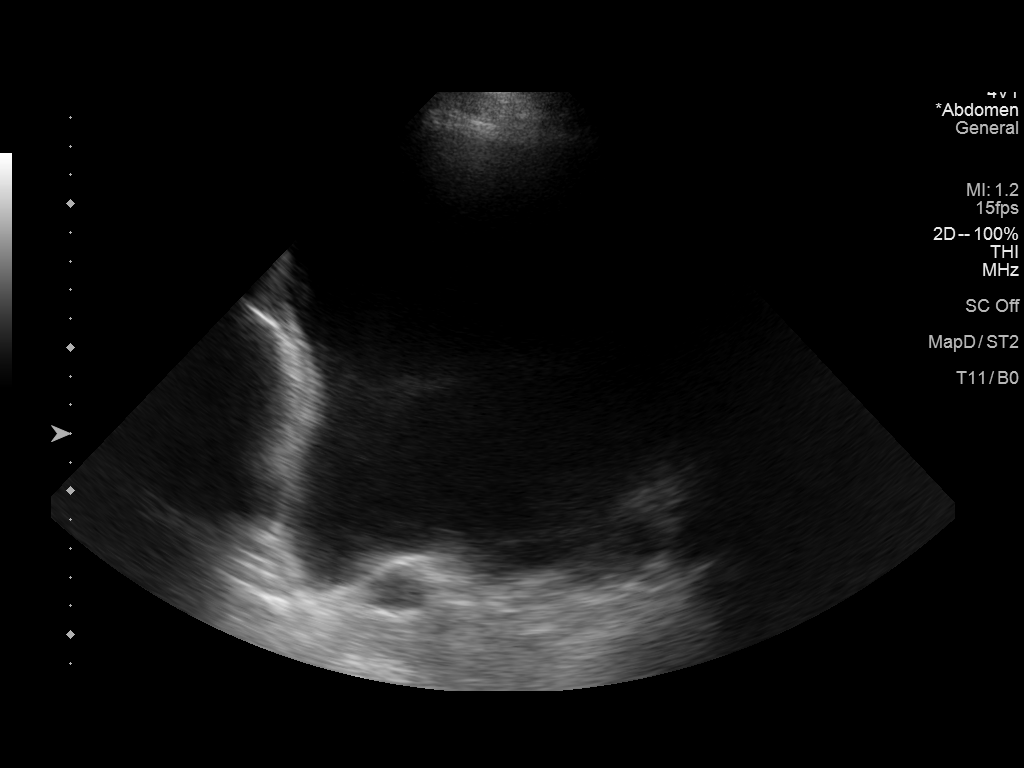
[im 2/4]
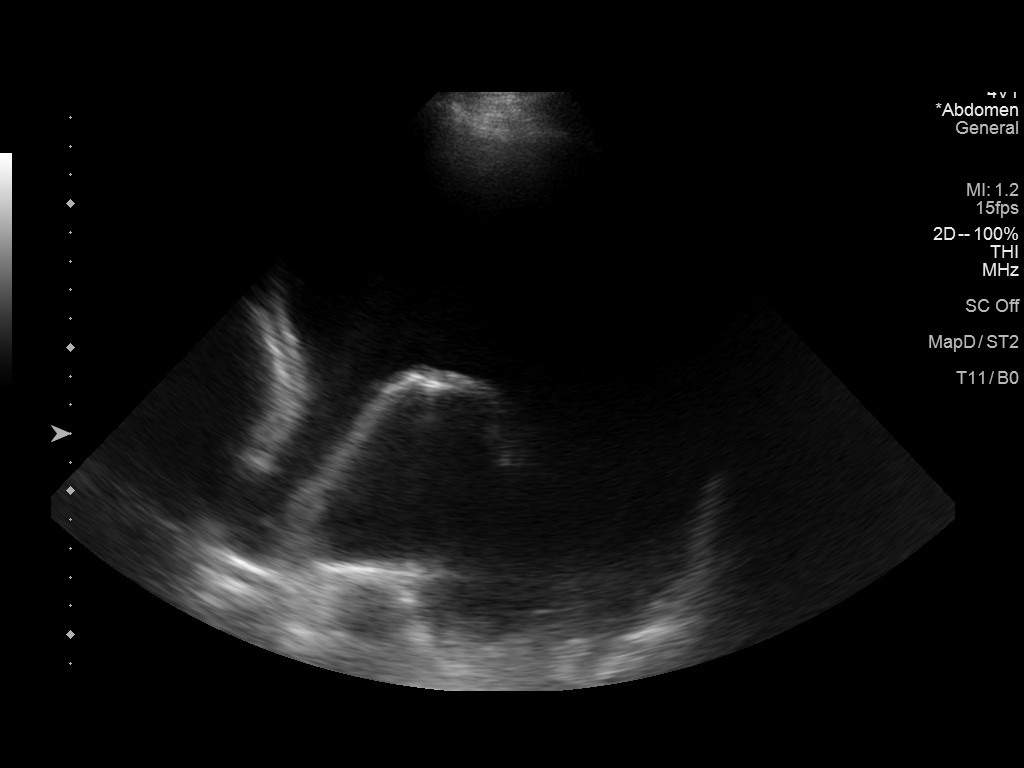
[im 3/4]
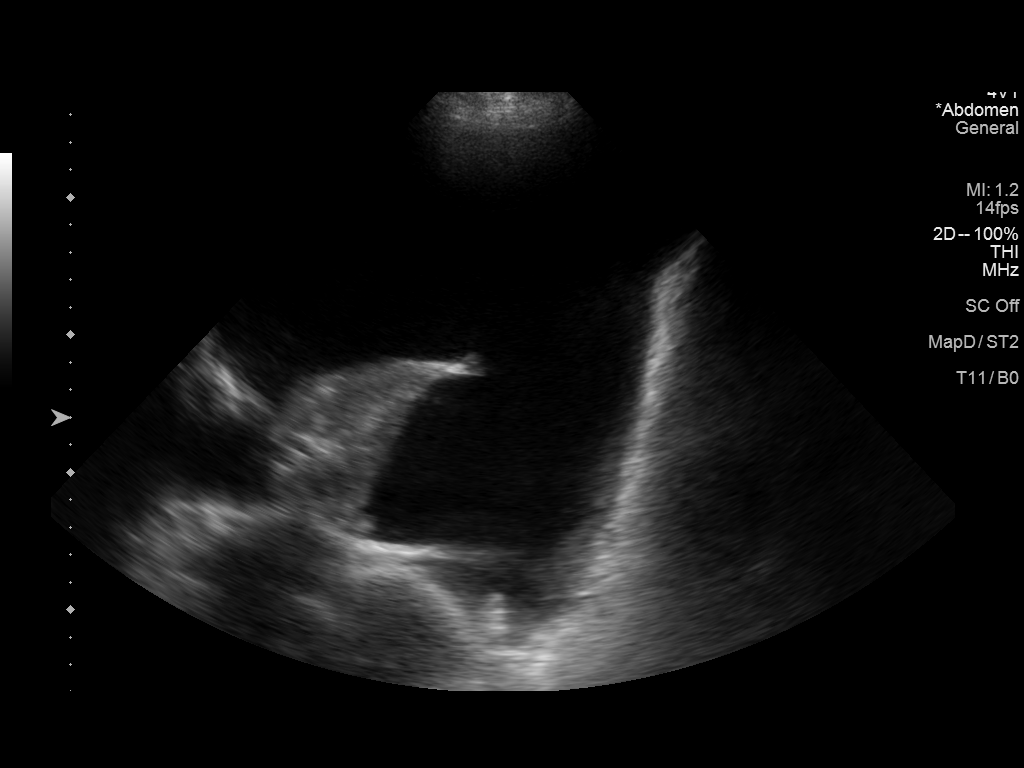
[im 4/4]
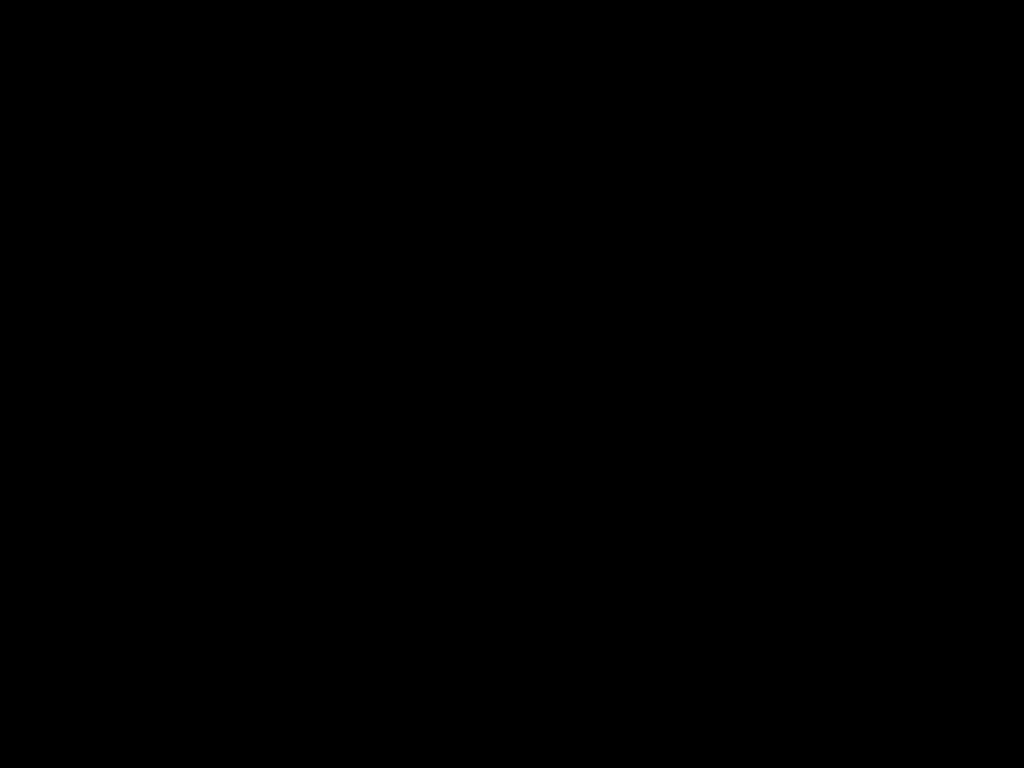

[4 of 4 positions shown; findings below may reference images not displayed]

EXAM:
ULTRASOUND GUIDED THERAPEUTIC RIGHT THORACENTESIS

MEDICATIONS:
None.

COMPLICATIONS:
None immediate.

PROCEDURE:
Procedure, benefits, and risks of procedure were discussed with
patient.

Written informed consent for procedure was obtained.

Time out protocol followed.

Pleural effusion localized by ultrasound at the posterior RIGHT
hemithorax.

Skin prepped and draped in usual sterile fashion.

Skin and soft tissues anesthetized with 10 mL of 1% lidocaine.

8 French thoracentesis catheter placed into the RIGHT pleural space.

1.7 L of dark old bloody fluid aspirated by syringe pump.

Procedure tolerated well by patient without immediate complication.
FINDINGS: As above
IMPRESSION: Successful ultrasound guided RIGHT thoracentesis yielding 1.7 L of
dark old bloody pleural fluid.
